# Patient Record
Sex: Female | Born: 1960
Health system: Southern US, Community
[De-identification: ages and names within clinical notes are randomized; demographics above are authoritative.]

## PROBLEM LIST (undated history)

## (undated) DIAGNOSIS — K529 Noninfective gastroenteritis and colitis, unspecified: Secondary | ICD-10-CM

## (undated) DIAGNOSIS — E669 Obesity, unspecified: Secondary | ICD-10-CM

## (undated) DIAGNOSIS — R51 Headache: Secondary | ICD-10-CM

## (undated) DIAGNOSIS — F419 Anxiety disorder, unspecified: Secondary | ICD-10-CM

## (undated) DIAGNOSIS — E119 Type 2 diabetes mellitus without complications: Secondary | ICD-10-CM

## (undated) DIAGNOSIS — D649 Anemia, unspecified: Secondary | ICD-10-CM

## (undated) DIAGNOSIS — K219 Gastro-esophageal reflux disease without esophagitis: Secondary | ICD-10-CM

## (undated) DIAGNOSIS — M1990 Unspecified osteoarthritis, unspecified site: Secondary | ICD-10-CM

## (undated) DIAGNOSIS — M797 Fibromyalgia: Secondary | ICD-10-CM

## (undated) DIAGNOSIS — R519 Headache, unspecified: Secondary | ICD-10-CM

## (undated) DIAGNOSIS — Z800 Family history of malignant neoplasm of digestive organs: Secondary | ICD-10-CM

## (undated) DIAGNOSIS — F4310 Post-traumatic stress disorder, unspecified: Secondary | ICD-10-CM

## (undated) DIAGNOSIS — Z8042 Family history of malignant neoplasm of prostate: Secondary | ICD-10-CM

## (undated) DIAGNOSIS — I10 Essential (primary) hypertension: Secondary | ICD-10-CM

## (undated) DIAGNOSIS — F329 Major depressive disorder, single episode, unspecified: Secondary | ICD-10-CM

## (undated) DIAGNOSIS — K635 Polyp of colon: Secondary | ICD-10-CM

## (undated) DIAGNOSIS — K760 Fatty (change of) liver, not elsewhere classified: Secondary | ICD-10-CM

## (undated) DIAGNOSIS — E039 Hypothyroidism, unspecified: Secondary | ICD-10-CM

## (undated) HISTORY — PX: KIDNEY SURGERY: SHX687

## (undated) HISTORY — DX: Obesity, unspecified: E66.9

## (undated) HISTORY — DX: Headache: R51

## (undated) HISTORY — DX: Fatty (change of) liver, not elsewhere classified: K76.0

## (undated) HISTORY — DX: Hypothyroidism, unspecified: E03.9

## (undated) HISTORY — DX: Headache, unspecified: R51.9

## (undated) HISTORY — DX: Fibromyalgia: M79.7

## (undated) HISTORY — DX: Anemia, unspecified: D64.9

## (undated) HISTORY — DX: Family history of malignant neoplasm of prostate: Z80.42

## (undated) HISTORY — DX: Unspecified osteoarthritis, unspecified site: M19.90

## (undated) HISTORY — DX: Type 2 diabetes mellitus without complications: E11.9

## (undated) HISTORY — PX: ANKLE SURGERY: SHX546

## (undated) HISTORY — DX: Noninfective gastroenteritis and colitis, unspecified: K52.9

## (undated) HISTORY — DX: Anxiety disorder, unspecified: F41.9

## (undated) HISTORY — DX: Family history of malignant neoplasm of digestive organs: Z80.0

## (undated) HISTORY — DX: Polyp of colon: K63.5

---

## 2013-09-22 HISTORY — PX: ABDOMINAL HYSTERECTOMY: SHX81

## 2013-09-22 HISTORY — PX: COLONOSCOPY: SHX174

## 2013-09-22 HISTORY — PX: VAGINAL HYSTERECTOMY: SUR661

## 2014-06-26 ENCOUNTER — Encounter: Payer: Self-pay | Admitting: Family Medicine

## 2017-09-22 DIAGNOSIS — K529 Noninfective gastroenteritis and colitis, unspecified: Secondary | ICD-10-CM

## 2017-09-22 HISTORY — DX: Noninfective gastroenteritis and colitis, unspecified: K52.9

## 2018-04-29 DIAGNOSIS — Z6841 Body Mass Index (BMI) 40.0 and over, adult: Secondary | ICD-10-CM | POA: Insufficient documentation

## 2018-04-29 DIAGNOSIS — M5135 Other intervertebral disc degeneration, thoracolumbar region: Secondary | ICD-10-CM | POA: Insufficient documentation

## 2018-04-29 DIAGNOSIS — I10 Essential (primary) hypertension: Secondary | ICD-10-CM | POA: Insufficient documentation

## 2018-09-01 DIAGNOSIS — K760 Fatty (change of) liver, not elsewhere classified: Secondary | ICD-10-CM | POA: Insufficient documentation

## 2018-10-27 ENCOUNTER — Other Ambulatory Visit: Payer: Self-pay

## 2018-10-27 ENCOUNTER — Emergency Department (HOSPITAL_COMMUNITY)
Admission: EM | Admit: 2018-10-27 | Discharge: 2018-10-27 | Disposition: A | Discharge status: Home / Self Care | Payer: Medicare HMO | Attending: Emergency Medicine | Admitting: Emergency Medicine

## 2018-10-27 ENCOUNTER — Emergency Department (HOSPITAL_COMMUNITY): Payer: Medicare HMO

## 2018-10-27 ENCOUNTER — Encounter (HOSPITAL_COMMUNITY): Payer: Self-pay | Admitting: Emergency Medicine

## 2018-10-27 DIAGNOSIS — W19XXXA Unspecified fall, initial encounter: Secondary | ICD-10-CM | POA: Diagnosis not present

## 2018-10-27 DIAGNOSIS — S8292XA Unspecified fracture of left lower leg, initial encounter for closed fracture: Secondary | ICD-10-CM | POA: Diagnosis not present

## 2018-10-27 DIAGNOSIS — Y939 Activity, unspecified: Secondary | ICD-10-CM | POA: Diagnosis not present

## 2018-10-27 DIAGNOSIS — Y929 Unspecified place or not applicable: Secondary | ICD-10-CM | POA: Diagnosis not present

## 2018-10-27 DIAGNOSIS — Y999 Unspecified external cause status: Secondary | ICD-10-CM | POA: Insufficient documentation

## 2018-10-27 DIAGNOSIS — M25579 Pain in unspecified ankle and joints of unspecified foot: Secondary | ICD-10-CM

## 2018-10-27 DIAGNOSIS — Q899 Congenital malformation, unspecified: Secondary | ICD-10-CM

## 2018-10-27 DIAGNOSIS — R55 Syncope and collapse: Secondary | ICD-10-CM

## 2018-10-27 DIAGNOSIS — S82892A Other fracture of left lower leg, initial encounter for closed fracture: Secondary | ICD-10-CM

## 2018-10-27 DIAGNOSIS — Z79899 Other long term (current) drug therapy: Secondary | ICD-10-CM | POA: Insufficient documentation

## 2018-10-27 HISTORY — DX: Post-traumatic stress disorder, unspecified: F43.10

## 2018-10-27 HISTORY — DX: Major depressive disorder, single episode, unspecified: F32.9

## 2018-10-27 LAB — CBC WITH DIFFERENTIAL/PLATELET
ABS IMMATURE GRANULOCYTES: 0.04 10*3/uL (ref 0.00–0.07)
BASOS PCT: 1 %
Basophils Absolute: 0.1 10*3/uL (ref 0.0–0.1)
Eosinophils Absolute: 0.1 10*3/uL (ref 0.0–0.5)
Eosinophils Relative: 1 %
HCT: 36.9 % (ref 36.0–46.0)
Hemoglobin: 11.1 g/dL — ABNORMAL LOW (ref 12.0–15.0)
Immature Granulocytes: 0 %
Lymphocytes Relative: 21 %
Lymphs Abs: 2 10*3/uL (ref 0.7–4.0)
MCH: 26.5 pg (ref 26.0–34.0)
MCHC: 30.1 g/dL (ref 30.0–36.0)
MCV: 88.1 fL (ref 80.0–100.0)
Monocytes Absolute: 0.5 10*3/uL (ref 0.1–1.0)
Monocytes Relative: 5 %
Neutro Abs: 7 10*3/uL (ref 1.7–7.7)
Neutrophils Relative %: 72 %
PLATELETS: 280 10*3/uL (ref 150–400)
RBC: 4.19 MIL/uL (ref 3.87–5.11)
RDW: 15.9 % — ABNORMAL HIGH (ref 11.5–15.5)
WBC: 9.7 10*3/uL (ref 4.0–10.5)
nRBC: 0 % (ref 0.0–0.2)

## 2018-10-27 LAB — COMPREHENSIVE METABOLIC PANEL
ALBUMIN: 3.5 g/dL (ref 3.5–5.0)
ALT: 29 U/L (ref 0–44)
AST: 28 U/L (ref 15–41)
Alkaline Phosphatase: 147 U/L — ABNORMAL HIGH (ref 38–126)
Anion gap: 11 (ref 5–15)
BILIRUBIN TOTAL: 0.3 mg/dL (ref 0.3–1.2)
BUN: 9 mg/dL (ref 6–20)
CO2: 25 mmol/L (ref 22–32)
Calcium: 9.1 mg/dL (ref 8.9–10.3)
Chloride: 105 mmol/L (ref 98–111)
Creatinine, Ser: 1.02 mg/dL — ABNORMAL HIGH (ref 0.44–1.00)
GFR calc Af Amer: >60 mL/min (ref >60)
GFR calc non Af Amer: >60 mL/min (ref >60)
Glucose, Bld: 132 mg/dL — ABNORMAL HIGH (ref 70–99)
POTASSIUM: 4.4 mmol/L (ref 3.5–5.1)
Sodium: 141 mmol/L (ref 135–145)
Total Protein: 6.9 g/dL (ref 6.5–8.1)

## 2018-10-27 MED ORDER — PROPOFOL 10 MG/ML IV BOLUS
2.0000 mg/kg | Freq: Once | INTRAVENOUS | Status: AC
  Administered 2018-10-27: 90 mg via INTRAVENOUS
  Filled 2018-10-27: qty 40

## 2018-10-27 MED ORDER — KETOROLAC TROMETHAMINE 30 MG/ML IJ SOLN
30.0000 mg | Freq: Once | INTRAMUSCULAR | Status: AC
  Administered 2018-10-27: 30 mg via INTRAVENOUS
  Filled 2018-10-27: qty 1

## 2018-10-27 MED ORDER — OXYCODONE-ACETAMINOPHEN 5-325 MG PO TABS: 1.0000 | ORAL_TABLET | Freq: Three times a day (TID) | ORAL | 0 refills | Status: DC | PRN

## 2018-10-27 MED ORDER — METHOCARBAMOL 500 MG PO TABS: 500.0000 mg | ORAL_TABLET | Freq: Two times a day (BID) | ORAL | 0 refills | Status: DC

## 2018-10-27 MED ORDER — HYDROMORPHONE HCL 1 MG/ML IJ SOLN
1.0000 mg | Freq: Once | INTRAMUSCULAR | Status: AC
  Administered 2018-10-27: 1 mg via INTRAVENOUS
  Filled 2018-10-27: qty 1

## 2018-10-27 MED ORDER — PROPOFOL 10 MG/ML IV BOLUS
INTRAVENOUS | Status: AC | PRN
  Administered 2018-10-27: 50 mg via INTRAVENOUS
  Administered 2018-10-27: 40 mg via INTRAVENOUS

## 2018-10-27 MED ORDER — FENTANYL CITRATE (PF) 100 MCG/2ML IJ SOLN
50.0000 ug | Freq: Once | INTRAMUSCULAR | Status: DC
  Filled 2018-10-27: qty 2

## 2018-10-27 MED ORDER — ONDANSETRON HCL 4 MG/2ML IJ SOLN
4.0000 mg | Freq: Once | INTRAMUSCULAR | Status: AC
  Administered 2018-10-27: 4 mg via INTRAVENOUS
  Filled 2018-10-27: qty 2

## 2018-10-27 MED ORDER — ONDANSETRON 4 MG PO TBDP
4.0000 mg | ORAL_TABLET | Freq: Once | ORAL | Status: AC
  Administered 2018-10-27: 4 mg via ORAL
  Filled 2018-10-27: qty 1

## 2018-10-27 MED ORDER — SODIUM CHLORIDE 0.9 % IV BOLUS
500.0000 mL | Freq: Once | INTRAVENOUS | Status: AC
  Administered 2018-10-27: 500 mL via INTRAVENOUS

## 2018-10-27 MED ORDER — IBUPROFEN 600 MG PO TABS: 600.0000 mg | ORAL_TABLET | Freq: Four times a day (QID) | ORAL | 0 refills | Status: DC | PRN

## 2018-10-27 NOTE — Discharge Instructions (Signed)
Please follow-up with the orthopedic doctor listed below regarding your ankle fracture. You will need to establish care with a primary care provider that can change your medications as needed as they may be causing you side effects. Return to the ED if you start to have chest pain, shortness of breath, headache and blurry vision, numbness in arms or legs.

## 2018-10-27 NOTE — ED Notes (Signed)
Patient verbalizes understanding of discharge instructions. Opportunity for questioning and answers were provided. Armband removed by staff, pt discharged from ED in wheelchair.  

## 2018-10-27 NOTE — ED Triage Notes (Addendum)
Pt arrives to ED from home with complaints of getting dizzy and having a near syncopal episode while reaching for something in her kitchen. Pt stated she fell and her left ankle but did not pass out. EMS reports pt has left ankle deformity but has pulses in foot. Pt received fentanyl via EMS.

## 2018-10-27 NOTE — ED Provider Notes (Signed)
[facility] EMERGENCY DEPARTMENT Provider Note   CSN: [id number] Arrival date & time: 10/27/18  1756     History   Chief Complaint Chief Complaint  Patient presents with  . Near Syncope  . Ankle Pain    HPI Tiffany Reese is a 58 y.o. female with a past medical history of hypertension, PTSD, who presents to ED for near syncopal episode, left ankle pain.  Patient recently moved to [facility] from [place] [employer] several months ago to be near her daughter.  She states that she has established care with a psychiatrist here who increased her dose of amitriptyline from 100 mg nightly to 150 mg nightly since December 2019.  Since then she feels that she has had dizziness, lightheadedness, near syncope and intermittent weakness in bilateral upper extremities.  States that when she tries to reach for certain things she will feel "like my arms are getting tired."  She had an episode prior to arrival where she was reaching for something in her kitchen, began feeling dizzy and fell.  This resulted in a left ankle deformity and pain.  She denies any head injury or loss of consciousness and is able to fully remember the event.  She called her daughter to come help her.  She was given 250 mcg of fentanyl by EMS.  She is not complaining of nausea.  The only trigger for the symptoms that she can think of is that the increase in the amitriptyline.  She denies any prior left ankle fracture, dislocation or procedure.  She denies any headache, vision changes, chest pain, shortness of breath, leg swelling.  HPI  Past Medical History:  Diagnosis Date  . Depression, major   . PTSD (post-traumatic stress disorder)     There are no active problems to display for this patient.   Past Surgical History:  Procedure Laterality Date  . ANKLE SURGERY       OB History   No obstetric history on file.      Home Medications    Prior to Admission medications   Medication Sig Start Date End Date  Taking? Authorizing Provider  ibuprofen (ADVIL,MOTRIN) 600 MG tablet Take 1 tablet (600 mg total) by mouth every 6 (six) hours as needed. 10/27/18   Suzie Vandam, PA-C  methocarbamol (ROBAXIN) 500 MG tablet Take 1 tablet (500 mg total) by mouth 2 (two) times daily. 10/27/18   Carry Ortez, PA-C  oxyCODONE-acetaminophen (PERCOCET/ROXICET) 5-325 MG tablet Take 1 tablet by mouth every 8 (eight) hours as needed for severe pain. 10/27/18   Dietrich Pates, PA-C    Family History History reviewed. No pertinent family history.  Social History Social History   Tobacco Use  . Smoking status: Never Smoker  . Smokeless tobacco: Never Used  Substance Use Topics  . Alcohol use: Not Currently  . Drug use: Not on file     Allergies   Sulfa antibiotics   Review of Systems Review of Systems  Constitutional: Positive for fatigue. Negative for appetite change, chills and fever.  HENT: Negative for ear pain, rhinorrhea, sneezing and sore throat.   Eyes: Negative for photophobia and visual disturbance.  Respiratory: Negative for cough, chest tightness, shortness of breath and wheezing.   Cardiovascular: Negative for chest pain and palpitations.  Gastrointestinal: Negative for abdominal pain, blood in stool, constipation, diarrhea, nausea and vomiting.  Genitourinary: Negative for dysuria, hematuria and urgency.  Musculoskeletal: Positive for arthralgias and joint swelling. Negative for myalgias.  Skin: Negative for rash.  Neurological: Positive for dizziness and weakness. Negative for light-headedness.     Physical Exam Updated Vital Signs BP (!) 101/91   Pulse 71   Temp 98.1 F (36.7 C) (Oral)   Resp 20   Ht 5\' 6"  (1.676 m)   Wt 127 kg   SpO2 95%   BMI 45.19 kg/m   Physical Exam Vitals signs and nursing note reviewed.  Constitutional:      General: She is not in acute distress.    Appearance: She is well-developed.  HENT:     Head: Normocephalic and atraumatic.     Nose: Nose normal.    Eyes:     General: No scleral icterus.       Left eye: No discharge.     Conjunctiva/sclera: Conjunctivae normal.  Neck:     Musculoskeletal: Normal range of motion and neck supple.  Cardiovascular:     Rate and Rhythm: Normal rate and regular rhythm.     Heart sounds: Normal heart sounds. No murmur. No friction rub. No gallop.   Pulmonary:     Effort: Pulmonary effort is normal. No respiratory distress.     Breath sounds: Normal breath sounds.  Abdominal:     General: Bowel sounds are normal. There is no distension.     Palpations: Abdomen is soft.     Tenderness: There is no abdominal tenderness. There is no guarding.  Musculoskeletal: Normal range of motion.        General: Swelling, tenderness and deformity (Left ankle) present.     Comments: 2+ DP pulse palpated of left ankle.  Skin:    General: Skin is warm and dry.     Findings: No rash.  Neurological:     General: No focal deficit present.     Mental Status: She is alert and oriented to person, place, and time.     Cranial Nerves: No cranial nerve deficit.     Sensory: No sensory deficit.     Motor: No weakness or abnormal muscle tone.     Coordination: Coordination normal.     Comments: Pupils reactive. No facial asymmetry noted. Cranial nerves appear grossly intact. Sensation intact to light touch on face, BUE and BLE.       ED Treatments / Results  Labs (all labs ordered are listed, but only abnormal results are displayed) Labs Reviewed  COMPREHENSIVE METABOLIC PANEL - Abnormal; Notable for the following components:      Result Value   Glucose, Bld 132 (*)    Creatinine, Ser 1.02 (*)    Alkaline Phosphatase 147 (*)    All other components within normal limits  CBC WITH DIFFERENTIAL/PLATELET - Abnormal; Notable for the following components:   Hemoglobin 11.1 (*)    RDW 15.9 (*)    All other components within normal limits  URINALYSIS, ROUTINE W REFLEX MICROSCOPIC    EKG EKG  Interpretation  Date/Time:  Wednesday October 27 2018 18:54:29 EST Ventricular Rate:  70 PR Interval:    QRS Duration: 103 QT Interval:  422 QTC Calculation: 456 R Axis:   -2 Text Interpretation:  Sinus rhythm Borderline T abnormalities, anterior leads No prior ECG, t wave inversion in leads V1-V3.  No STEMI Confirmed by Theda Belfastegeler, Chris ([id number]) on 10/27/2018 7:41:22 PM   Radiology Dg Chest 2 View  Result Date: 10/27/2018 CLINICAL DATA:  Near syncope. EXAM: CHEST - 2 VIEW COMPARISON:  None. FINDINGS: The heart size and mediastinal contours are within normal limits. Both lungs are clear. The  visualized skeletal structures are unremarkable. IMPRESSION: No active cardiopulmonary disease. Electronically Signed   By: Elsie Stain M.D.   On: 10/27/2018 20:25   Dg Ankle 2 Views Left  Result Date: 10/27/2018 CLINICAL DATA:  Larey Seat at home. EXAM: LEFT ANKLE - 2 VIEW COMPARISON:  None. FINDINGS: Fracture dislocation noted at the ankle. The talus is dislocated laterally in relation to the tibia. Markedly displaced distal fibular fracture and posterior malleolar fracture of the tibia. Transverse fracture through the base of the medial malleolus. The talus is intact. The subtalar joints are maintained. IMPRESSION: Complex comminuted fracture dislocation at the ankle. Electronically Signed   By: Rudie Meyer M.D.   On: 10/27/2018 19:37   Dg Ankle Left Port  Result Date: 10/27/2018 CLINICAL DATA:  Postreduction. EXAM: PORTABLE LEFT ANKLE - 2 VIEW COMPARISON:  LEFT ankle radiograph October 27, 2018 at 1915 hours FINDINGS: Acute trimalleolar fracture in alignment. Improved alignment of ankle mortise, mild widening of the anterior tibial talar space. Thick fiberglass splint obscures the fine bony detail. IMPRESSION: 1. Acute trimalleolar fracture in alignment. 2. Improved alignment of ankle mortise. Electronically Signed   By: Awilda Metro M.D.   On: 10/27/2018 21:45    Procedures Procedures (including  critical care time)  Medications Ordered in ED Medications  HYDROmorphone (DILAUDID) injection 1 mg (has no administration in time range)  ketorolac (TORADOL) 30 MG/ML injection 30 mg (has no administration in time range)  sodium chloride 0.9 % bolus 500 mL (0 mLs Intravenous Stopped 10/27/18 2049)  ondansetron (ZOFRAN) injection 4 mg (4 mg Intravenous Given 10/27/18 1856)  HYDROmorphone (DILAUDID) injection 1 mg (1 mg Intravenous Given 10/27/18 1944)  propofol (DIPRIVAN) 10 mg/mL bolus/IV push 254 mg (90 mg Intravenous Given 10/27/18 2053)  propofol (DIPRIVAN) 10 mg/mL bolus/IV push (40 mg Intravenous Given 10/27/18 2057)     Initial Impression / Assessment and Plan / ED Course  I have reviewed the triage vital signs and the nursing notes.  Pertinent labs & imaging results that were available during my care of the patient were reviewed by me and considered in my medical decision making (see chart for details).  Clinical Course as of Oct 27 2150  Wed Oct 27, 2018  2002 Spoke to Dr. Eulah Pont of orthopedics.  He recommends for reduction, splinting immediately with turning medially. Would like for [place] to get post reduction films with splint.   [HK]    Clinical Course User Index [HK] Dietrich Pates, PA-C    58 year old female presents to ED for near syncopal episodes.  She had an episode today when she was reaching for something in her kitchen which caused her to fall and twist her ankle.  Is had intermittent symptoms for the past month ever since she increased her dose of Elavil from 100 mg to 150 mg as advised by her psychiatrist.  On my exam she is overall well-appearing.  No deficits on neurological exam noted.  She denies any chest pain, shortness of breath, headache or vision changes.  She did not have any head injuries or loss of consciousness with the prior syncopal episode prior to arrival.  Vital signs are within normal limits.  CBC, CMP, chest x-ray unremarkable.  EKG shows some t wave inversions  with no prior EKGs for comparison. Ankle x-ray shows comminuted, displaced fracture.  This was reduced with procedural sedation here and splinted.  Post reduction films were sent to Dr. Eulah Pont of orthopedics.  He request that she follow-up in his office.  I am unsure if this is all related to medication changes.  Due to her reassuring work-up and imaging studies, I feel that she is appropriate for following up with her PCP for any further medication changes.  Will advise patient to increase hydration and return to ED for any severe worsening symptoms. Of note, daughter at bedside is concerned due to patient's history with opioid pain medication.  Patient states that she is to be in a pain management clinic and "I was never addicted to it, I did take it."  I discussed risks and benefits with the patient stating that she does have a severe ankle fracture that will cause a large amount of pain.  I will encourage her to take ibuprofen as well as the Percocet that we are giving her which will be used for breakthrough pain.  Patient is agreeable to this plan.  [place] PMP queried. Sedation reduction done by Dr. Rush Landmarkegeler.  Please see his note for further detail.   Evaluation does not show pathology that would require ongoing emergent intervention or inpatient treatment. I explained the diagnosis to the patient. Pain has been managed and has no complaints prior to discharge. Patient is comfortable with above plan and is stable for discharge at this time. All questions were answered prior to disposition. Strict return precautions for returning to the ED were discussed. Encouraged follow up with PCP.    Portions of this note were generated with Scientist, clinical (histocompatibility and immunogenetics)Dragon dictation software. Dictation errors may occur despite best attempts at proofreading.   Final Clinical Impressions(s) / ED Diagnoses   Final diagnoses:  Closed fracture of left ankle, initial encounter  Ankle pain    ED Discharge Orders         Ordered     oxyCODONE-acetaminophen (PERCOCET/ROXICET) 5-325 MG tablet  Every 8 hours PRN     10/27/18 2150    methocarbamol (ROBAXIN) 500 MG tablet  2 times daily     10/27/18 2151    ibuprofen (ADVIL,MOTRIN) 600 MG tablet  Every 6 hours PRN     10/27/18 2152           Dietrich PatesKhatri, Smiley Birr, PA-C 10/27/18 2152    Tegeler, Canary Brimhristopher J, MD 10/28/18 [phone]

## 2018-10-28 ENCOUNTER — Telehealth: Payer: Self-pay | Admitting: *Deleted

## 2018-10-28 NOTE — ED Provider Notes (Signed)
.Sedation Date/Time: 10/28/2018 1:14 AM Performed by: Heide Scales, MD Authorized by: Heide Scales, MD   Consent:    Consent obtained:  Verbal   Consent given by:  Patient   Risks discussed:  Allergic reaction, dysrhythmia, inadequate sedation, nausea, prolonged hypoxia resulting in organ damage, prolonged sedation necessitating reversal, respiratory compromise necessitating ventilatory assistance and intubation and vomiting   Alternatives discussed:  Analgesia without sedation, anxiolysis and regional anesthesia Universal protocol:    Procedure explained and questions answered to patient or proxy's satisfaction: yes     Relevant documents present and verified: yes     Test results available and properly labeled: yes     Imaging studies available: yes     Required blood products, implants, devices, and special equipment available: yes     Site/side marked: yes     Immediately prior to procedure a time out was called: yes     Patient identity confirmation method:  Verbally with patient Indications:    Procedure necessitating sedation performed by:  Physician performing sedation Pre-sedation assessment:    Time since last food or drink:  Hours ago   ASA classification: class 1 - normal, healthy patient     Neck mobility: normal     Mouth opening:  3 or more finger widths   Thyromental distance:  4 finger widths   Mallampati score:  I - soft palate, uvula, fauces, pillars visible   Pre-sedation assessments completed and reviewed: airway patency, cardiovascular function, hydration status, mental status, nausea/vomiting, pain level, respiratory function and temperature   Immediate pre-procedure details:    Reassessment: Patient reassessed immediately prior to procedure     Reviewed: vital signs, relevant labs/tests and NPO status     Verified: bag valve mask available, emergency equipment available, intubation equipment available, IV patency confirmed, oxygen available  and suction available   Procedure details (see MAR for exact dosages):    Preoxygenation:  Nasal cannula   Sedation:  Propofol   Intra-procedure monitoring:  Blood pressure monitoring, cardiac monitor, continuous pulse oximetry, frequent LOC assessments, frequent vital sign checks and continuous capnometry   Intra-procedure events: none     Total Provider sedation time (minutes):  30 Post-procedure details:    Attendance: Constant attendance by certified staff until patient recovered     Recovery: Patient returned to pre-procedure baseline     Post-sedation assessments completed and reviewed: airway patency, cardiovascular function, hydration status, mental status, nausea/vomiting, pain level, respiratory function and temperature     Patient is stable for discharge or admission: yes     Patient tolerance:  Tolerated well, no immediate complications Reduction of fracture Date/Time: 10/28/2018 1:14 AM Performed by: Heide Scales, MD Authorized by: Heide Scales, MD  Consent: Written consent obtained. Risks and benefits: risks, benefits and alternatives were discussed Consent given by: patient Patient understanding: patient states understanding of the procedure being performed Patient consent: the patient's understanding of the procedure matches consent given Procedure consent: procedure consent matches procedure scheduled Test results: test results available and properly labeled Imaging studies: imaging studies available Required items: required blood products, implants, devices, and special equipment available Patient identity confirmed: verbally with patient and hospital-assigned identification number Time out: Immediately prior to procedure a "time out" was called to verify the correct patient, procedure, equipment, support staff and site/side marked as required.  Sedation: Patient sedated: yes Sedation type: moderate (conscious) sedation  Patient tolerance: Patient  tolerated the procedure well with no immediate complications Comments: Left ankle  fracture reduction.  After reduction, normal sensation, pulse, and strength of toes.       Aaryana Betke, Canary Brim, MD 10/28/18 [phone]

## 2018-10-28 NOTE — Telephone Encounter (Signed)
Pt daughter called regarding DME order faxed to company that does not accept mother's insurance.  [facility] faxed order to [facility] as requested AND gave verbal order to employee.

## 2018-10-29 ENCOUNTER — Emergency Department (HOSPITAL_COMMUNITY)
Admission: EM | Admit: 2018-10-29 | Discharge: 2018-10-29 | Disposition: A | Discharge status: Home / Self Care | Payer: Medicare HMO | Attending: Emergency Medicine | Admitting: Emergency Medicine

## 2018-10-29 ENCOUNTER — Other Ambulatory Visit: Payer: Self-pay

## 2018-10-29 ENCOUNTER — Encounter (HOSPITAL_COMMUNITY): Payer: Self-pay | Admitting: *Deleted

## 2018-10-29 ENCOUNTER — Emergency Department (HOSPITAL_COMMUNITY): Payer: Medicare HMO

## 2018-10-29 DIAGNOSIS — S82892D Other fracture of left lower leg, subsequent encounter for closed fracture with routine healing: Secondary | ICD-10-CM | POA: Insufficient documentation

## 2018-10-29 DIAGNOSIS — W19XXXD Unspecified fall, subsequent encounter: Secondary | ICD-10-CM | POA: Insufficient documentation

## 2018-10-29 DIAGNOSIS — M25572 Pain in left ankle and joints of left foot: Secondary | ICD-10-CM

## 2018-10-29 DIAGNOSIS — S8992XD Unspecified injury of left lower leg, subsequent encounter: Secondary | ICD-10-CM | POA: Diagnosis present

## 2018-10-29 MED ORDER — MORPHINE SULFATE (PF) 4 MG/ML IV SOLN
4.0000 mg | Freq: Once | INTRAVENOUS | Status: AC
  Administered 2018-10-29: 4 mg via INTRAMUSCULAR
  Filled 2018-10-29: qty 1

## 2018-10-29 MED ORDER — MORPHINE SULFATE (PF) 4 MG/ML IV SOLN: 4.0000 mg | Freq: Once | INTRAVENOUS | Status: DC

## 2018-10-29 NOTE — ED Notes (Signed)
Pt asking for more pain medicine.

## 2018-10-29 NOTE — ED Notes (Signed)
C/o no pain relief yet from this pain med ice pack on her lt ankle

## 2018-10-29 NOTE — ED Notes (Signed)
One unsuccessful attempt to start iv  No vivible veins

## 2018-10-29 NOTE — Discharge Instructions (Addendum)
Continue to use your splint at all times until you've seen the orthopedist. Continue to use your home walker and wheel chair to help with getting around. Ice and elevate ankle/foot throughout the day, using ice pack for no more than 20 minutes every hour.  Alternate between ibuprofen and home pain med for pain relief. Do not drive or operate machinery with pain medication use. Follow up with your orthopedist in 1 week for recheck of symptoms and ongoing management of your known ankle fracture. Return to the ER for changes or worsening symptoms.

## 2018-10-29 NOTE — ED Notes (Signed)
To x-ray

## 2018-10-29 NOTE — ED Triage Notes (Signed)
The pt is c/o paion since she fell onto her lt ankle earlier today  She was seen here Wednesday night and had a fracture reduced  Now the pain is severe since the fall

## 2018-10-29 NOTE — ED Notes (Signed)
Iv team nurse here

## 2018-10-29 NOTE — ED Provider Notes (Signed)
[facility] EMERGENCY DEPARTMENT Provider Note   CSN: [id number] Arrival date & time: 10/29/18  1738     History   Chief Complaint Chief Complaint  Patient presents with  . Ankle Pain    HPI Tiffany Reese is a 58 y.o. female with a PMHx of depression and PTSD, who presents to the ED with complaints of L ankle and foot pain after mechanical fall today just PTA.  Chart review reveals that she was seen in the ED on 10/27/18 for near syncope/fall resulting in L ankle trimalleolar fracture with dislocation, which was reduced and splinted, she was sent home with pain meds and advised to f/up with Dr. Eulah Pont of orthopedics.  Patient states that she has had the splint on at all times, she has been using a wheelchair and a walker at home for ambulation.  She is scheduled for surgery next week with Dr. Eulah Pont.  She states that today she was getting out of her wheelchair to go to the bathroom, she was hopping on her right leg when she stumbled, causing her to fall into the wall, but she put her left foot down and now has worsening of her left ankle and foot pain.  She describes this pain as 10/10 constant throbbing and sharp left ankle pain that radiates into the left foot, worse with movement of the ankle or foot, and with no treatments tried prior to arrival, although she took a Norco prior to the fall for her baseline left ankle pain from her fracture.  She is unsure whether there is any swelling or bruising because she has not taken the splint off.  She has some tingling and burning in the foot which has increased since this most recent injury, but was present previously.  She denies hitting her head or losing consciousness, denies falling to the ground or sustaining any other injuries.  She denies any numbness or focal weakness, or any other complaints at this time.  The history is provided by the patient and medical records. No language interpreter was used.  Ankle Pain    Past  Medical History:  Diagnosis Date  . Depression, major   . PTSD (post-traumatic stress disorder)     There are no active problems to display for this patient.   Past Surgical History:  Procedure Laterality Date  . ANKLE SURGERY       OB History   No obstetric history on file.      Home Medications    Prior to Admission medications   Medication Sig Start Date End Date Taking? Authorizing Provider  ibuprofen (ADVIL,MOTRIN) 600 MG tablet Take 1 tablet (600 mg total) by mouth every 6 (six) hours as needed. 10/27/18   Khatri, Hina, PA-C  methocarbamol (ROBAXIN) 500 MG tablet Take 1 tablet (500 mg total) by mouth 2 (two) times daily. 10/27/18   Khatri, Hina, PA-C  oxyCODONE-acetaminophen (PERCOCET/ROXICET) 5-325 MG tablet Take 1 tablet by mouth every 8 (eight) hours as needed for severe pain. 10/27/18   Dietrich Pates, PA-C    Family History No family history on file.  Social History Social History   Tobacco Use  . Smoking status: Never Smoker  . Smokeless tobacco: Never Used  Substance Use Topics  . Alcohol use: Not Currently  . Drug use: Not on file     Allergies   Sulfa antibiotics   Review of Systems Review of Systems  HENT: Negative for facial swelling (no head inj).   Musculoskeletal: Positive for  arthralgias and myalgias.  Allergic/Immunologic: Negative for immunocompromised state.  Neurological: Negative for syncope, weakness and numbness.  Psychiatric/Behavioral: Negative for confusion.      Physical Exam Updated Vital Signs BP 114/64   Pulse 84   Temp 97.9 F (36.6 C)   Resp 20   Ht 5\' 6"  (1.676 m)   Wt 127 kg   SpO2 98%   BMI 45.19 kg/m   Physical Exam Vitals signs and nursing note reviewed.  Constitutional:      General: She is not in acute distress.    Appearance: Normal appearance. She is well-developed. She is not toxic-appearing.     Comments: Afebrile, nontoxic, NAD  HENT:     Head: Normocephalic and atraumatic.  Eyes:     General:          Right eye: No discharge.        Left eye: No discharge.     Conjunctiva/sclera: Conjunctivae normal.  Neck:     Musculoskeletal: Normal range of motion and neck supple.  Cardiovascular:     Rate and Rhythm: Normal rate.     Pulses: Normal pulses.  Pulmonary:     Effort: Pulmonary effort is normal. No respiratory distress.  Abdominal:     General: There is no distension.  Musculoskeletal:     Left ankle: She exhibits decreased range of motion (due to pain and splint), swelling and ecchymosis. She exhibits no deformity. Tenderness.     Comments: L ankle and foot in splint, no gross deformity noted, moderate swelling and some older appearing bruising to the ankle, none to the foot, diffusely TTP to ankle and foot, no crepitus or deformity, wiggles toes without difficulty, ROM of ankle limited due to splint and due to pain, sensation grossly intact, distal pulses intact, soft compartments.   Skin:    General: Skin is warm and dry.     Findings: No rash.  Neurological:     Mental Status: She is alert and oriented to person, place, and time.     Sensory: Sensation is intact. No sensory deficit.     Motor: Motor function is intact.  Psychiatric:        Mood and Affect: Mood and affect normal.        Behavior: Behavior normal.      ED Treatments / Results  Labs (all labs ordered are listed, but only abnormal results are displayed) Labs Reviewed - No data to display  EKG None  Radiology Dg Chest 2 View  Result Date: 10/27/2018 CLINICAL DATA:  Near syncope. EXAM: CHEST - 2 VIEW COMPARISON:  None. FINDINGS: The heart size and mediastinal contours are within normal limits. Both lungs are clear. The visualized skeletal structures are unremarkable. IMPRESSION: No active cardiopulmonary disease. Electronically Signed   By: Elsie Stain M.D.   On: 10/27/2018 20:25   Dg Ankle Complete Left  Result Date: 10/29/2018 CLINICAL DATA:  58 year old female who fell 2 days ago breaking ankle,  repeat fall today. EXAM: LEFT ANKLE COMPLETE - 3+ VIEW COMPARISON:  Post reduction images 2520 and earlier. FINDINGS: Cast or splint material remains in place about the ankle with underlying trimalleolar fracture not significantly changed in alignment from the post reduction images 2 days ago. Talus and calcaneus appear remain intact. IMPRESSION: Trimalleolar fracture with stable configuration since the post reduction images 2 days ago. Electronically Signed   By: Odessa Fleming M.D.   On: 10/29/2018 19:24   Dg Ankle Left Port  Result Date: 10/27/2018  CLINICAL DATA:  Postreduction. EXAM: PORTABLE LEFT ANKLE - 2 VIEW COMPARISON:  LEFT ankle radiograph October 27, 2018 at 1915 hours FINDINGS: Acute trimalleolar fracture in alignment. Improved alignment of ankle mortise, mild widening of the anterior tibial talar space. Thick fiberglass splint obscures the fine bony detail. IMPRESSION: 1. Acute trimalleolar fracture in alignment. 2. Improved alignment of ankle mortise. Electronically Signed   By: Awilda Metroourtnay  Bloomer M.D.   On: 10/27/2018 21:45   Dg Foot Complete Left  Result Date: 10/29/2018 CLINICAL DATA:  58 year old female who fell 2 days ago breaking ankle, repeat fall today. EXAM: LEFT FOOT - COMPLETE 3+ VIEW COMPARISON:  Ankle series today and earlier. FINDINGS: Cast material projects about the foot and ankle and decreases bone detail on some images. There is new or increased distal soft tissue swelling and stranding at the MTP joint level. No soft tissue gas. No definite metatarsal or phalanx fracture. No definite tarsal fracture. IMPRESSION: 1. Cast material which obscures bone detail on some images. 2. New or increased soft tissue swelling and stranding at the dorsal MTP level. No associated foot fracture identified. 3. Left ankle reported separately. Electronically Signed   By: Odessa FlemingH  Hall M.D.   On: 10/29/2018 19:26    Procedures Procedures (including critical care time)  Medications Ordered in  ED Medications  morphine 4 MG/ML injection 4 mg (4 mg Intramuscular Given 10/29/18 1822)     Initial Impression / Assessment and Plan / ED Course  I have reviewed the triage vital signs and the nursing notes.  Pertinent labs & imaging results that were available during my care of the patient were reviewed by me and considered in my medical decision making (see chart for details).  Clinical Course as of Oct 30 1931  Fri Oct 29, 2018  [id number] 58 year old female who had broken her left ankle a couple of days ago saw Dr. Eulah PontMurphy today in the office.  Plan is for surgery soon.  She unfortunately was at home today trying to transfer when she fell to the ground and banged her leg again.  She is complaining of severe ankle pain.  Distal neurovascular intact.  Splint still looks intact.  Rechecking x-rays and will review with orthopedics.   [MB]    Clinical Course User Index [MB] Terrilee FilesButler, Michael C, MD    58 y.o. female here with mechanical fall landing on her left foot which already has a left ankle fracture.  She was hopping on her right foot when she lost her balance fell into the wall, but her left foot down.  Now complains of worsening left ankle and foot pain.  On exam, splint intact, diffuse tenderness to the ankle and foot, moderate swelling and mild bruising which appears old.  Neurovascularly intact.  Will get x-rays, give pain medicine, and reassess shortly.  7:41 PM Xrays show trimalleolar fracture with stable configuration since post reduction films 2 days ago; foot xray with soft tissue swelling but no associated foot fracture. Overall stable exam/findings. Suspect she hurts just from putting weight through the ankle that is already fractured. Feeling better after morphine. Advised using home pain meds, RICE, and f/up with ortho this week for her already scheduled surgery, and for ongoing management of her known ankle fracture. Doubt need for further intervention today. Doubt need for emergent  ortho consultation. I explained the diagnosis and have given explicit precautions to return to the ER including for any other new or worsening symptoms. The patient understands and accepts the medical  plan as it's been dictated and I have answered their questions. Discharge instructions concerning home care and prescriptions have been given. The patient is STABLE and is discharged to home in good condition.    Final Clinical Impressions(s) / ED Diagnoses   Final diagnoses:  Acute left ankle pain  Closed fracture of left ankle with routine healing, subsequent encounter    ED Discharge Orders    [address]None       Hasna Stefanik, [place], [place]-C 10/29/18 1941    Terrilee FilesButler, Michael C, MD 10/29/18 2318

## 2018-11-01 ENCOUNTER — Ambulatory Visit (INDEPENDENT_AMBULATORY_CARE_PROVIDER_SITE_OTHER): Payer: Medicare HMO | Admitting: Family Medicine

## 2018-11-01 ENCOUNTER — Encounter: Payer: Self-pay | Admitting: Family Medicine

## 2018-11-01 VITALS — BP 140/62 | HR 89

## 2018-11-01 DIAGNOSIS — E039 Hypothyroidism, unspecified: Secondary | ICD-10-CM

## 2018-11-01 DIAGNOSIS — R7303 Prediabetes: Secondary | ICD-10-CM | POA: Insufficient documentation

## 2018-11-01 DIAGNOSIS — F419 Anxiety disorder, unspecified: Secondary | ICD-10-CM | POA: Diagnosis not present

## 2018-11-01 DIAGNOSIS — F339 Major depressive disorder, recurrent, unspecified: Secondary | ICD-10-CM | POA: Diagnosis not present

## 2018-11-01 DIAGNOSIS — D649 Anemia, unspecified: Secondary | ICD-10-CM | POA: Diagnosis not present

## 2018-11-01 DIAGNOSIS — F4310 Post-traumatic stress disorder, unspecified: Secondary | ICD-10-CM | POA: Diagnosis not present

## 2018-11-01 DIAGNOSIS — S82892A Other fracture of left lower leg, initial encounter for closed fracture: Secondary | ICD-10-CM | POA: Insufficient documentation

## 2018-11-01 DIAGNOSIS — E559 Vitamin D deficiency, unspecified: Secondary | ICD-10-CM

## 2018-11-01 DIAGNOSIS — S82892D Other fracture of left lower leg, subsequent encounter for closed fracture with routine healing: Secondary | ICD-10-CM

## 2018-11-01 LAB — VITAMIN B12: Vitamin B-12: 308 pg/mL (ref 211–911)

## 2018-11-01 LAB — HEMOGLOBIN A1C: Hgb A1c MFr Bld: 6.5 % (ref 4.6–6.5)

## 2018-11-01 LAB — IBC + FERRITIN
Ferritin: 27.1 ng/mL (ref 10.0–291.0)
Iron: 34 ug/dL — ABNORMAL LOW (ref 42–145)
Saturation Ratios: 10.3 % — ABNORMAL LOW (ref 20.0–50.0)
Transferrin: 236 mg/dL (ref 212.0–360.0)

## 2018-11-01 LAB — VITAMIN D 25 HYDROXY (VIT D DEFICIENCY, FRACTURES): VITD: 32.38 ng/mL (ref 30.00–100.00)

## 2018-11-01 LAB — TSH: TSH: 3.23 u[IU]/mL (ref 0.35–4.50)

## 2018-11-01 MED ORDER — DIAZEPAM 2 MG PO TABS: 2.0000 mg | ORAL_TABLET | Freq: Every day | ORAL | 0 refills | Status: DC | PRN

## 2018-11-01 NOTE — Progress Notes (Signed)
Tiffany ChestnutKelly Lea Reese DOB: May 28, 1961 Encounter date: 11/01/2018  This is a 58 y.o. female who presents to establish care. Chief Complaint  Patient presents with  . New Patient (Initial Visit)    History of present illness:  Wanted new family practitioner. Here today with daughter. Was seeing doc and psychiatrist in [place]. Has had rough few years. Saw doc in [place] until [place] got home here in [place]. Had a few medication changes that they worry made her collapse. Started out with some intermittent loss of muscle function - noted with reaching for things in cabinet or fridge and arm would just not work. Noted that she could hear "swishing" in ear when going outside. Day she had fall she had called daughter to let her know she wasn't feeling well. She then went into kitchen to get chips and reached up but started feeling funny and then just collapsed causing her to fracture her left ankle.   Recent trimalleolar fracture scheduled for surgery next week. In ER 2/7 due to increased ankle pain after landing on known fractured foot while trying to hop around at house. Has surgery this coming Friday. Was given hydrocodone but still having significant pain. Took 2 of the 5mg  hydrocodone last night. Had pins, needles, burning/stinging through leg and sharp pains through foot.   PTSD/Depression: Severe depression, anxiety, PTSD. Was living in [place]; in pain clinic at time. Was going through divorce; ex owed a lot of money to her. Tried to commit suicide. Ended up hospitalized and following with psychiatrist. Felt like she was doing really well, but then once back in [place] - anxiety and PTSD really started to go rampant. Was able to come off of all opioid medications when she was in inpatient. That is some of concern with break - trying to keep her from spiraling back to using opioids. Felt like she did better when she was on the valium.   Wanting to establish with mental health provider;  getting back on track with diet and getting to healthier weight.   Hx of migraines - comes behind one eye or other. Was on imitrex in the past but this made her sick. Takes tylenol if needed. Cannot tolerate ASA in excedrin migraine.   GERD: has heartburn, sour burps if she does not take the omeprazole.   Has been without the valium since October-November 2019.   Past Medical History:  Diagnosis Date  . Depression, major   . PTSD (post-traumatic stress disorder)    Past Surgical History:  Procedure Laterality Date  . ANKLE SURGERY     Allergies  Allergen Reactions  . Bupropion Other (See Comments)    Causes agitation  . Duloxetine Other (See Comments)    Causes involuntary movements  . Sulfa Antibiotics   . Metformin And Related     vomiting  . Paxil [Paroxetine Hcl]     Insomnia; manic  . Prozac [Fluoxetine Hcl]     Hyperactive, insomnia  . Trintellix [Vortioxetine]     Triggers fibromyalgia  . Zoloft [Sertraline Hcl]     Hyperactivity, insomnia   Current Meds  Medication Sig  . amitriptyline (ELAVIL) 150 MG tablet Take by mouth.  Marland Kitchen. atorvastatin (LIPITOR) 20 MG tablet Take by mouth.  . cetirizine (ZYRTEC) 10 MG tablet Take by mouth.  Marland Kitchen. ibuprofen (ADVIL,MOTRIN) 600 MG tablet Take 1 tablet (600 mg total) by mouth every 6 (six) hours as needed.  Marland Kitchen. levothyroxine (SYNTHROID, LEVOTHROID) 50 MCG tablet Take 50 mcg by mouth  daily before breakfast.  . lurasidone (LATUDA) 80 MG TABS tablet Take 80 mg by mouth daily with breakfast.  . methocarbamol (ROBAXIN) 500 MG tablet Take 1 tablet (500 mg total) by mouth 2 (two) times daily.  . metoprolol succinate (TOPROL-XL) 50 MG 24 hr tablet Take 50 mg by mouth daily. Take with or immediately following a meal.  . omeprazole (PRILOSEC) 40 MG capsule Take 40 mg by mouth daily.  Marland Kitchen oxyCODONE-acetaminophen (PERCOCET/ROXICET) 5-325 MG tablet Take 1 tablet by mouth every 8 (eight) hours as needed for severe pain.  . pregabalin (LYRICA) 50 MG  capsule Take 50 mg by mouth 3 (three) times daily.   Social History   Tobacco Use  . Smoking status: Current Every Day Smoker    Types: E-cigarettes  . Smokeless tobacco: Never Used  Substance Use Topics  . Alcohol use: Not Currently   Family History  Problem Relation Age of Onset  . COPD Mother 26  . Alcohol abuse Mother   . CAD Father   . Colon cancer Father 87  . Ankylosing spondylitis Sister   . Drug abuse Sister   . Alcohol abuse Sister   . Other Maternal Grandmother        lived to be over 100  . Lung cancer Maternal Grandfather   . Aneurysm Paternal Grandmother        brain     Review of Systems  Constitutional: Negative for chills, fatigue and fever.  Respiratory: Negative for cough, chest tightness, shortness of breath and wheezing.   Cardiovascular: Negative for chest pain, palpitations and leg swelling.    Objective:  BP 140/62 (BP Location: Right Arm, Patient Position: Sitting, Cuff Size: Normal)   Pulse 89   SpO2 97%       BP Readings from Last 3 Encounters:  11/01/18 140/62  10/29/18 104/60  10/27/18 103/66   Wt Readings from Last 3 Encounters:  10/29/18 280 lb (127 kg)  10/27/18 280 lb (127 kg)    Physical Exam Constitutional:      General: She is not in acute distress.    Appearance: She is well-developed.  Cardiovascular:     Rate and Rhythm: Normal rate and regular rhythm.     Heart sounds: Normal heart sounds. No murmur. No friction rub.  Pulmonary:     Effort: Pulmonary effort is normal. No respiratory distress.     Breath sounds: Normal breath sounds. No wheezing or rales.  Musculoskeletal:     Right lower leg: No edema.     Left lower leg: No edema.  Neurological:     Mental Status: She is alert and oriented to person, place, and time.  Psychiatric:        Attention and Perception: Attention normal.        Mood and Affect: Mood is anxious.        Behavior: Behavior normal. Behavior is cooperative.     Comments: Poor hygiene.  Has been unable to bathe/shower. Appears older than stated age but acts younger than stated age. She is somewhat tangential with talking, but I feel this in part is due to her trying to update me on last years of history as quickly as possible.     Assessment/Plan: 1. PTSD (post-traumatic stress disorder) Needs to be seeing psychiatry for med management. Has had difficult time tolerating meds. Needs specialist for review/continued prescribing. - Ambulatory referral to Psychiatry  2. Anxiety Small amount valium given. Discussed risk of medication, esp with pain medications  on board. I do worry about her anxiety spiraling out of hand, esp with new fracture, limited mobility, so I think this will be a good emergency "tool" for her while awaiting psychiatry. We discussed risks of this medication long term. - Ambulatory referral to Psychiatry - diazepam (VALIUM) 2 MG tablet; Take 1 tablet (2 mg total) by mouth daily as needed for anxiety (severe anxiety).  Dispense: 15 tablet; Refill: 0  3. Depression, recurrent (HCC) - Ambulatory referral to Psychiatry  4. Closed fracture of left ankle with routine healing, subsequent encounter Has surgery later this week. Ordered home health today as she is in need of help with self care, med management.  - Ambulatory referral to Home Health  5. Anemia, unspecified type Concern with this due to poor ability to care for self, food prep, access to food, etc. Daughter does live in town and is able to help. - Vitamin B12; Future - IBC + Ferritin - Vitamin B12  6. Vitamin D deficiency  - VITAMIN D 25 Hydroxy (Vit-D Deficiency, Fractures); Future - VITAMIN D 25 Hydroxy (Vit-D Deficiency, Fractures)  7. Hypothyroidism, unspecified type  - TSH; Future - TSH  8. Prediabetes Not certain that there is need or indication for Januvia with her being just predibetic? She didn't tolerate metformin. Will get previous records and review med list in more detail with  this. - Hemoglobin A1c; Future - Hemoglobin A1c  Return in about 1 month (around 11/30/2018).  Theodis Shove, MD   It was nice meeting with you today!  We will work on getting things back on track for you. You should hear from psychiatry in next couple of weeks regarding scheduling. You should also hear from home health in this time frame. Please call if you haven't heard from them in 2 weeks time.   I will call you when I get bloodwork results back (probably Wednesday).   I have sent a small supply of valium to the pharmacy for you. This is an "emergency" medication for you when anxiety is very severe. You can certainly break in half first to see if half tablet will help you before trying whole tablet. You will need to call a therapist (list provided with those accepting medicare in the area) to get started with this important aspect of your mental health.   I would like to see you back in a month to touch base on everything including mood, referral status, etc. Please let me know sooner if concerns.

## 2018-11-01 NOTE — Patient Instructions (Addendum)
It was nice meeting with you today!  We will work on getting things back on track for you. You should hear from psychiatry in next couple of weeks regarding scheduling. You should also hear from home health in this time frame. Please call if you haven't heard from them in 2 weeks time.   I will call you when I get bloodwork results back (probably Wednesday).   I have sent a small supply of valium to the pharmacy for you. This is an "emergency" medication for you when anxiety is very severe. You can certainly break in half first to see if half tablet will help you before trying whole tablet. You will need to call a therapist (list provided with those accepting medicare in the area) to get started with this important aspect of your mental health.   I would like to see you back in a month to touch base on everything including mood, referral status, etc. Please let me know sooner if concerns.

## 2018-11-01 NOTE — Progress Notes (Signed)
Tried to reach patient by phone for preop phone call. Unable to reach and there is no voice mail set up on home phone or cell phone. [facility] voicemail at Dr Greig RightMurphy's to let her know that we are unable to reach patient and that patient must be seen by anesthesiologist prior to DOS due to BMI of 45.19 and would they also try to reach patient.

## 2018-11-02 ENCOUNTER — Other Ambulatory Visit: Payer: Self-pay

## 2018-11-02 ENCOUNTER — Encounter (HOSPITAL_BASED_OUTPATIENT_CLINIC_OR_DEPARTMENT_OTHER): Payer: Self-pay | Admitting: *Deleted

## 2018-11-02 NOTE — Progress Notes (Signed)
Dr Krista Blue saw pt for review, BMI 45.8. OK for [facility].

## 2018-11-02 NOTE — H&P (Signed)
[facility] [address]   [address] [place] [address] [phone] A Division of [facility]                                                RE: AAKRITI, GOANS   [id number]   21-Sep-2061 PROGRESS NOTE: 10-29-18 Reason for visit: New evaluation of emergency room follow up for acute traumatic left ankle fracture on October 27, 2018.   History of present illness: This is an acute problem that began on October 27, 2018.  She was reaching for a snack in the kitchen and fell.  She presented to the emergency room via EMS where her comminuted trimalleolar fracture dislocation was reduced and splinted.  Referred for follow up.  Today her pain is controlled, although she has a history of narcotic abuse and was in a pain clinic, but has not had narcotic medicines regularly, per her report, since 2007.  She denies numbness or paresthesias.  She has a history of pre-diabetes, fatty liver, fibromyalgia and depression.  She vapes.  She has a Sulfa allergy.  EXAMINATION: Well appearing and in no apparent distress.  Left ankle splint in good condition on arrival.  Sensation is intact distally.  Calf compressible about the splint.    X-RAYS: None today-previous x-rays reviewed post splinting and reduction of her trimalleolar left ankle fracture.    ASSESSMENT/PLAN: Acute traumatic left ankle trimalleolar fracture.  We emphasized elevation to reduce swelling.  Prescription for lift chair provided.  She has a knee scooter and a wheelchair.  Details, risks and benefits of ORIF, left ankle, discussed at length with patient and her daughter today.  They verbalize understanding and wish to proceed-plan for now for one week to allow for swelling to reduce.    Jewel Baize.  Eulah Pont, M.D.  Dictated by: Avis Epley, PA-C Electronically verified by Jewel Baize Eulah Pont, M.D. TDM(HCM):jjh D 11-01-18 T 11-02-18

## 2018-11-03 NOTE — Progress Notes (Addendum)
Spoke with Sheria Lang patient daughter and confirmed surgery moved from [facility] to [facility] arrive 1030 11-05-2018 Npo after midnight meds to take sip of water: metoprolol, latuda, omeprazole, levothryoxine Driver daughter Sheria Lang cell [phone] Has surgery orders in epic  Needs I stat 4 and ekg See progress notes 11-02-2018 patient had anesthsia consult 11-02-2018 for bmi 45.87

## 2018-11-04 ENCOUNTER — Encounter: Payer: Self-pay | Admitting: Family Medicine

## 2018-11-04 DIAGNOSIS — K219 Gastro-esophageal reflux disease without esophagitis: Secondary | ICD-10-CM | POA: Insufficient documentation

## 2018-11-04 DIAGNOSIS — I10 Essential (primary) hypertension: Secondary | ICD-10-CM | POA: Insufficient documentation

## 2018-11-04 DIAGNOSIS — F329 Major depressive disorder, single episode, unspecified: Secondary | ICD-10-CM | POA: Insufficient documentation

## 2018-11-04 DIAGNOSIS — E119 Type 2 diabetes mellitus without complications: Secondary | ICD-10-CM | POA: Insufficient documentation

## 2018-11-05 ENCOUNTER — Ambulatory Visit (HOSPITAL_BASED_OUTPATIENT_CLINIC_OR_DEPARTMENT_OTHER): Payer: Medicare HMO | Admitting: Anesthesiology

## 2018-11-05 ENCOUNTER — Other Ambulatory Visit: Payer: Self-pay

## 2018-11-05 ENCOUNTER — Other Ambulatory Visit: Payer: Self-pay | Admitting: Family Medicine

## 2018-11-05 ENCOUNTER — Encounter (HOSPITAL_BASED_OUTPATIENT_CLINIC_OR_DEPARTMENT_OTHER): Payer: Self-pay

## 2018-11-05 ENCOUNTER — Encounter (HOSPITAL_COMMUNITY)
Admission: RE | Disposition: A | Discharge status: Skilled Nursing Facility | Payer: Self-pay | Source: Home / Self Care | Attending: Orthopedic Surgery

## 2018-11-05 ENCOUNTER — Ambulatory Visit (HOSPITAL_BASED_OUTPATIENT_CLINIC_OR_DEPARTMENT_OTHER): Admit: 2018-11-05 | Payer: Self-pay | Admitting: Orthopedic Surgery

## 2018-11-05 ENCOUNTER — Observation Stay (HOSPITAL_BASED_OUTPATIENT_CLINIC_OR_DEPARTMENT_OTHER)
Admission: RE | Admit: 2018-11-05 | Discharge: 2018-11-09 | Disposition: A | Discharge status: Skilled Nursing Facility | Payer: Medicare HMO | Attending: Orthopedic Surgery | Admitting: Orthopedic Surgery

## 2018-11-05 DIAGNOSIS — W1830XA Fall on same level, unspecified, initial encounter: Secondary | ICD-10-CM | POA: Diagnosis not present

## 2018-11-05 DIAGNOSIS — I10 Essential (primary) hypertension: Secondary | ICD-10-CM | POA: Diagnosis not present

## 2018-11-05 DIAGNOSIS — S82892D Other fracture of left lower leg, subsequent encounter for closed fracture with routine healing: Secondary | ICD-10-CM

## 2018-11-05 DIAGNOSIS — F17200 Nicotine dependence, unspecified, uncomplicated: Secondary | ICD-10-CM | POA: Diagnosis not present

## 2018-11-05 DIAGNOSIS — R2681 Unsteadiness on feet: Secondary | ICD-10-CM | POA: Insufficient documentation

## 2018-11-05 DIAGNOSIS — I159 Secondary hypertension, unspecified: Secondary | ICD-10-CM

## 2018-11-05 DIAGNOSIS — K219 Gastro-esophageal reflux disease without esophagitis: Secondary | ICD-10-CM | POA: Diagnosis not present

## 2018-11-05 DIAGNOSIS — S82852A Displaced trimalleolar fracture of left lower leg, initial encounter for closed fracture: Principal | ICD-10-CM | POA: Insufficient documentation

## 2018-11-05 DIAGNOSIS — E119 Type 2 diabetes mellitus without complications: Secondary | ICD-10-CM | POA: Diagnosis not present

## 2018-11-05 DIAGNOSIS — F339 Major depressive disorder, recurrent, unspecified: Secondary | ICD-10-CM

## 2018-11-05 DIAGNOSIS — F329 Major depressive disorder, single episode, unspecified: Secondary | ICD-10-CM

## 2018-11-05 DIAGNOSIS — S82892A Other fracture of left lower leg, initial encounter for closed fracture: Secondary | ICD-10-CM | POA: Diagnosis present

## 2018-11-05 DIAGNOSIS — R7303 Prediabetes: Secondary | ICD-10-CM

## 2018-11-05 DIAGNOSIS — E039 Hypothyroidism, unspecified: Secondary | ICD-10-CM

## 2018-11-05 DIAGNOSIS — F419 Anxiety disorder, unspecified: Secondary | ICD-10-CM

## 2018-11-05 DIAGNOSIS — F4310 Post-traumatic stress disorder, unspecified: Secondary | ICD-10-CM

## 2018-11-05 HISTORY — PX: ORIF ANKLE FRACTURE: SHX5408

## 2018-11-05 HISTORY — DX: Essential (primary) hypertension: I10

## 2018-11-05 HISTORY — DX: Gastro-esophageal reflux disease without esophagitis: K21.9

## 2018-11-05 LAB — CBC
HCT: 36.3 % (ref 36.0–46.0)
Hemoglobin: 10.7 g/dL — ABNORMAL LOW (ref 12.0–15.0)
MCH: 27 pg (ref 26.0–34.0)
MCHC: 29.5 g/dL — ABNORMAL LOW (ref 30.0–36.0)
MCV: 91.7 fL (ref 80.0–100.0)
PLATELETS: 295 10*3/uL (ref 150–400)
RBC: 3.96 MIL/uL (ref 3.87–5.11)
RDW: 16.6 % — ABNORMAL HIGH (ref 11.5–15.5)
WBC: 10.6 10*3/uL — ABNORMAL HIGH (ref 4.0–10.5)
nRBC: 0 % (ref 0.0–0.2)

## 2018-11-05 LAB — CREATININE, SERUM
Creatinine, Ser: 0.98 mg/dL (ref 0.44–1.00)
GFR calc Af Amer: >60 mL/min (ref >60)
GFR calc non Af Amer: >60 mL/min (ref >60)

## 2018-11-05 LAB — GLUCOSE, CAPILLARY
Glucose-Capillary: 107 mg/dL — ABNORMAL HIGH (ref 70–99)
Glucose-Capillary: 114 mg/dL — ABNORMAL HIGH (ref 70–99)
Glucose-Capillary: 109 mg/dL — ABNORMAL HIGH (ref 70–99)

## 2018-11-05 SURGERY — OPEN REDUCTION INTERNAL FIXATION (ORIF) ANKLE FRACTURE
Anesthesia: Choice | Laterality: Left

## 2018-11-05 SURGERY — OPEN REDUCTION INTERNAL FIXATION (ORIF) ANKLE FRACTURE
Anesthesia: General | Site: Ankle | Laterality: Left

## 2018-11-05 MED ORDER — FUROSEMIDE 20 MG PO TABS
20.0000 mg | ORAL_TABLET | Freq: Every day | ORAL | Status: DC
  Administered 2018-11-05 – 2018-11-09 (×4): 20 mg via ORAL
  Filled 2018-11-05 (×7): qty 1

## 2018-11-05 MED ORDER — MIDAZOLAM HCL 2 MG/2ML IJ SOLN
2.0000 mg | Freq: Once | INTRAMUSCULAR | Status: AC
  Administered 2018-11-05: 2 mg via INTRAVENOUS
  Filled 2018-11-05: qty 2

## 2018-11-05 MED ORDER — HYDROMORPHONE HCL 1 MG/ML IJ SOLN
0.5000 mg | INTRAMUSCULAR | Status: DC | PRN
  Administered 2018-11-05 – 2018-11-06 (×4): 1 mg via INTRAVENOUS
  Filled 2018-11-05 (×5): qty 1

## 2018-11-05 MED ORDER — LIDOCAINE 2% (20 MG/ML) 5 ML SYRINGE
INTRAMUSCULAR | Status: DC | PRN
  Administered 2018-11-05: 100 mg via INTRAVENOUS

## 2018-11-05 MED ORDER — PREGABALIN 50 MG PO CAPS
50.0000 mg | ORAL_CAPSULE | Freq: Three times a day (TID) | ORAL | Status: DC
  Administered 2018-11-05 – 2018-11-09 (×13): 50 mg via ORAL
  Filled 2018-11-05 (×14): qty 1

## 2018-11-05 MED ORDER — METHOCARBAMOL 500 MG PO TABS: 500.0000 mg | ORAL_TABLET | Freq: Three times a day (TID) | ORAL | 0 refills | Status: DC | PRN

## 2018-11-05 MED ORDER — GABAPENTIN 300 MG PO CAPS
300.0000 mg | ORAL_CAPSULE | Freq: Three times a day (TID) | ORAL | Status: DC
  Filled 2018-11-05 (×2): qty 1

## 2018-11-05 MED ORDER — GABAPENTIN 300 MG PO CAPS
300.0000 mg | ORAL_CAPSULE | Freq: Once | ORAL | Status: AC
  Administered 2018-11-05: 300 mg via ORAL
  Filled 2018-11-05: qty 1

## 2018-11-05 MED ORDER — ACETAMINOPHEN 160 MG/5ML PO SOLN
325.0000 mg | ORAL | Status: DC | PRN
  Filled 2018-11-05: qty 20.3

## 2018-11-05 MED ORDER — LORATADINE 10 MG PO TABS
10.0000 mg | ORAL_TABLET | Freq: Every day | ORAL | Status: DC
  Administered 2018-11-05 – 2018-11-09 (×5): 10 mg via ORAL
  Filled 2018-11-05 (×6): qty 1

## 2018-11-05 MED ORDER — LURASIDONE HCL 40 MG PO TABS
80.0000 mg | ORAL_TABLET | Freq: Every day | ORAL | Status: DC
  Administered 2018-11-05 – 2018-11-06 (×2): 80 mg via ORAL
  Filled 2018-11-05: qty 2
  Filled 2018-11-05: qty 1
  Filled 2018-11-05: qty 2

## 2018-11-05 MED ORDER — MIDAZOLAM HCL 2 MG/ML PO SYRP
2.0000 mg | ORAL_SOLUTION | Freq: Once | ORAL | Status: DC
  Filled 2018-11-05: qty 2

## 2018-11-05 MED ORDER — ASPIRIN EC 81 MG PO TBEC: 81.0000 mg | DELAYED_RELEASE_TABLET | Freq: Two times a day (BID) | ORAL | 0 refills | Status: DC

## 2018-11-05 MED ORDER — METOPROLOL SUCCINATE ER 50 MG PO TB24
50.0000 mg | ORAL_TABLET | Freq: Every day | ORAL | Status: DC
  Administered 2018-11-06 – 2018-11-09 (×4): 50 mg via ORAL
  Filled 2018-11-05 (×5): qty 1

## 2018-11-05 MED ORDER — MIDAZOLAM HCL 2 MG/2ML IJ SOLN
INTRAMUSCULAR | Status: AC
  Filled 2018-11-05: qty 2

## 2018-11-05 MED ORDER — CLONIDINE HCL (ANALGESIA) 100 MCG/ML EP SOLN
EPIDURAL | Status: DC | PRN
  Administered 2018-11-05: 100 ug

## 2018-11-05 MED ORDER — MEPERIDINE HCL 25 MG/ML IJ SOLN
6.2500 mg | INTRAMUSCULAR | Status: DC | PRN
  Filled 2018-11-05: qty 1

## 2018-11-05 MED ORDER — ACETAMINOPHEN 500 MG PO TABS: 1000.0000 mg | ORAL_TABLET | Freq: Three times a day (TID) | ORAL | 0 refills | Status: DC

## 2018-11-05 MED ORDER — AMITRIPTYLINE HCL 50 MG PO TABS
50.0000 mg | ORAL_TABLET | Freq: Two times a day (BID) | ORAL | Status: DC | PRN
  Administered 2018-11-05: 50 mg via ORAL
  Filled 2018-11-05 (×2): qty 1

## 2018-11-05 MED ORDER — KETOROLAC TROMETHAMINE 15 MG/ML IJ SOLN
15.0000 mg | Freq: Four times a day (QID) | INTRAMUSCULAR | Status: AC
  Administered 2018-11-05 – 2018-11-06 (×4): 15 mg via INTRAVENOUS
  Filled 2018-11-05 (×5): qty 1

## 2018-11-05 MED ORDER — ONDANSETRON HCL 4 MG PO TABS
4.0000 mg | ORAL_TABLET | Freq: Four times a day (QID) | ORAL | Status: DC | PRN
  Administered 2018-11-08: 4 mg via ORAL
  Filled 2018-11-05 (×2): qty 1

## 2018-11-05 MED ORDER — CEFAZOLIN SODIUM-DEXTROSE 2-4 GM/100ML-% IV SOLN
INTRAVENOUS | Status: AC
  Filled 2018-11-05: qty 100

## 2018-11-05 MED ORDER — CEFAZOLIN SODIUM-DEXTROSE 2-4 GM/100ML-% IV SOLN
2.0000 g | Freq: Four times a day (QID) | INTRAVENOUS | Status: AC
  Administered 2018-11-05 – 2018-11-06 (×3): 2 g via INTRAVENOUS
  Filled 2018-11-05 (×4): qty 100

## 2018-11-05 MED ORDER — OXYCODONE HCL 5 MG PO TABS
5.0000 mg | ORAL_TABLET | Freq: Once | ORAL | Status: DC | PRN
  Filled 2018-11-05: qty 1

## 2018-11-05 MED ORDER — ONDANSETRON HCL 4 MG PO TABS: 4.0000 mg | ORAL_TABLET | Freq: Three times a day (TID) | ORAL | 0 refills | Status: DC | PRN

## 2018-11-05 MED ORDER — ONDANSETRON HCL 4 MG/2ML IJ SOLN
INTRAMUSCULAR | Status: DC | PRN
  Administered 2018-11-05: 4 mg via INTRAVENOUS

## 2018-11-05 MED ORDER — PANTOPRAZOLE SODIUM 40 MG PO TBEC
40.0000 mg | DELAYED_RELEASE_TABLET | Freq: Every day | ORAL | Status: DC
  Administered 2018-11-06 – 2018-11-09 (×4): 40 mg via ORAL
  Filled 2018-11-05 (×5): qty 1

## 2018-11-05 MED ORDER — LACTATED RINGERS IV SOLN
INTRAVENOUS | Status: DC
  Administered 2018-11-05: 17:00:00 via INTRAVENOUS
  Filled 2018-11-05: qty 1000

## 2018-11-05 MED ORDER — FENTANYL CITRATE (PF) 100 MCG/2ML IJ SOLN
25.0000 ug | INTRAMUSCULAR | Status: DC | PRN
  Filled 2018-11-05: qty 1

## 2018-11-05 MED ORDER — PROPOFOL 10 MG/ML IV BOLUS
INTRAVENOUS | Status: DC | PRN
  Administered 2018-11-05: 250 mg via INTRAVENOUS
  Administered 2018-11-05: 50 mg via INTRAVENOUS

## 2018-11-05 MED ORDER — LIDOCAINE 2% (20 MG/ML) 5 ML SYRINGE
INTRAMUSCULAR | Status: AC
  Filled 2018-11-05: qty 5

## 2018-11-05 MED ORDER — GABAPENTIN 300 MG PO CAPS
ORAL_CAPSULE | ORAL | Status: AC
  Filled 2018-11-05: qty 1

## 2018-11-05 MED ORDER — BUPIVACAINE HCL (PF) 0.5 % IJ SOLN
INTRAMUSCULAR | Status: DC | PRN
  Administered 2018-11-05: 7 mL

## 2018-11-05 MED ORDER — ENOXAPARIN SODIUM 40 MG/0.4ML ~~LOC~~ SOLN
40.0000 mg | SUBCUTANEOUS | Status: DC
  Administered 2018-11-06 – 2018-11-09 (×4): 40 mg via SUBCUTANEOUS
  Filled 2018-11-05 (×5): qty 0.4

## 2018-11-05 MED ORDER — LINAGLIPTIN 5 MG PO TABS
5.0000 mg | ORAL_TABLET | Freq: Every day | ORAL | Status: DC
  Administered 2018-11-05 – 2018-11-09 (×5): 5 mg via ORAL
  Filled 2018-11-05 (×6): qty 1

## 2018-11-05 MED ORDER — ONDANSETRON HCL 4 MG/2ML IJ SOLN
4.0000 mg | Freq: Four times a day (QID) | INTRAMUSCULAR | Status: DC | PRN
  Administered 2018-11-05 – 2018-11-07 (×2): 4 mg via INTRAVENOUS
  Filled 2018-11-05 (×3): qty 2

## 2018-11-05 MED ORDER — ACETAMINOPHEN 325 MG PO TABS
325.0000 mg | ORAL_TABLET | ORAL | Status: DC | PRN
  Filled 2018-11-05: qty 2

## 2018-11-05 MED ORDER — POLYETHYLENE GLYCOL 3350 17 G PO PACK
17.0000 g | PACK | Freq: Every day | ORAL | Status: DC | PRN
  Filled 2018-11-05: qty 1

## 2018-11-05 MED ORDER — DOCUSATE SODIUM 100 MG PO CAPS: 100.0000 mg | ORAL_CAPSULE | Freq: Two times a day (BID) | ORAL | 0 refills | Status: DC

## 2018-11-05 MED ORDER — OXYCODONE HCL 5 MG PO TABS: 5.0000 mg | ORAL_TABLET | ORAL | 0 refills | Status: DC | PRN

## 2018-11-05 MED ORDER — LACTATED RINGERS IV SOLN
INTRAVENOUS | Status: DC
  Administered 2018-11-05: 1000 mL via INTRAVENOUS
  Filled 2018-11-05: qty 1000

## 2018-11-05 MED ORDER — ACETAMINOPHEN 500 MG PO TABS
1000.0000 mg | ORAL_TABLET | Freq: Once | ORAL | Status: AC
  Administered 2018-11-05: 1000 mg via ORAL
  Filled 2018-11-05: qty 2

## 2018-11-05 MED ORDER — ONDANSETRON HCL 4 MG/2ML IJ SOLN
4.0000 mg | Freq: Once | INTRAMUSCULAR | Status: DC | PRN
  Filled 2018-11-05: qty 2

## 2018-11-05 MED ORDER — METOCLOPRAMIDE HCL 5 MG/ML IJ SOLN
5.0000 mg | Freq: Three times a day (TID) | INTRAMUSCULAR | Status: DC | PRN
  Filled 2018-11-05: qty 2

## 2018-11-05 MED ORDER — DIPHENHYDRAMINE HCL 12.5 MG/5ML PO ELIX
12.5000 mg | ORAL_SOLUTION | ORAL | Status: DC | PRN
  Filled 2018-11-05 (×2): qty 10

## 2018-11-05 MED ORDER — FENTANYL CITRATE (PF) 100 MCG/2ML IJ SOLN
INTRAMUSCULAR | Status: AC
  Filled 2018-11-05: qty 2

## 2018-11-05 MED ORDER — DIAZEPAM 2 MG PO TABS
2.0000 mg | ORAL_TABLET | Freq: Two times a day (BID) | ORAL | Status: DC | PRN
  Administered 2018-11-06 – 2018-11-08 (×2): 2 mg via ORAL
  Filled 2018-11-05 (×3): qty 1

## 2018-11-05 MED ORDER — CEFAZOLIN SODIUM-DEXTROSE 2-4 GM/100ML-% IV SOLN
2.0000 g | INTRAVENOUS | Status: AC
  Administered 2018-11-05: 2 g via INTRAVENOUS
  Filled 2018-11-05: qty 100

## 2018-11-05 MED ORDER — LEVOTHYROXINE SODIUM 50 MCG PO TABS
50.0000 ug | ORAL_TABLET | Freq: Every day | ORAL | Status: DC
  Administered 2018-11-06 – 2018-11-09 (×4): 50 ug via ORAL
  Filled 2018-11-05 (×5): qty 1

## 2018-11-05 MED ORDER — METHOCARBAMOL 500 MG PO TABS
500.0000 mg | ORAL_TABLET | Freq: Four times a day (QID) | ORAL | Status: DC | PRN
  Administered 2018-11-05 – 2018-11-08 (×5): 500 mg via ORAL
  Filled 2018-11-05 (×6): qty 1

## 2018-11-05 MED ORDER — OXYCODONE HCL 5 MG PO TABS
5.0000 mg | ORAL_TABLET | ORAL | Status: DC | PRN
  Administered 2018-11-05 – 2018-11-09 (×19): 10 mg via ORAL
  Filled 2018-11-05 (×21): qty 2

## 2018-11-05 MED ORDER — LACTATED RINGERS IV SOLN
INTRAVENOUS | Status: DC
  Filled 2018-11-05: qty 1000

## 2018-11-05 MED ORDER — ONDANSETRON HCL 4 MG/2ML IJ SOLN
INTRAMUSCULAR | Status: AC
  Filled 2018-11-05: qty 2

## 2018-11-05 MED ORDER — PROPOFOL 10 MG/ML IV BOLUS
INTRAVENOUS | Status: AC
  Filled 2018-11-05: qty 40

## 2018-11-05 MED ORDER — ACETAMINOPHEN 325 MG PO TABS
325.0000 mg | ORAL_TABLET | Freq: Four times a day (QID) | ORAL | Status: DC | PRN
  Administered 2018-11-07 – 2018-11-09 (×3): 650 mg via ORAL
  Filled 2018-11-05 (×4): qty 2

## 2018-11-05 MED ORDER — ROPIVACAINE HCL 7.5 MG/ML IJ SOLN
INTRAMUSCULAR | Status: DC | PRN
  Administered 2018-11-05: 25 mL via PERINEURAL

## 2018-11-05 MED ORDER — FENTANYL CITRATE (PF) 100 MCG/2ML IJ SOLN
100.0000 ug | Freq: Once | INTRAMUSCULAR | Status: AC
  Administered 2018-11-05: 100 ug via INTRAVENOUS
  Filled 2018-11-05: qty 2

## 2018-11-05 MED ORDER — METHOCARBAMOL 1000 MG/10ML IJ SOLN
500.0000 mg | Freq: Four times a day (QID) | INTRAVENOUS | Status: DC | PRN
  Filled 2018-11-05: qty 5

## 2018-11-05 MED ORDER — DOCUSATE SODIUM 100 MG PO CAPS
100.0000 mg | ORAL_CAPSULE | Freq: Two times a day (BID) | ORAL | Status: DC
  Administered 2018-11-06 – 2018-11-09 (×7): 100 mg via ORAL
  Filled 2018-11-05 (×9): qty 1

## 2018-11-05 MED ORDER — ACETAMINOPHEN 500 MG PO TABS
1000.0000 mg | ORAL_TABLET | Freq: Three times a day (TID) | ORAL | Status: AC
  Administered 2018-11-05 – 2018-11-06 (×4): 1000 mg via ORAL
  Filled 2018-11-05 (×5): qty 2

## 2018-11-05 MED ORDER — CHLORHEXIDINE GLUCONATE 4 % EX LIQD
60.0000 mL | Freq: Once | CUTANEOUS | Status: DC
  Filled 2018-11-05: qty 118

## 2018-11-05 MED ORDER — ACETAMINOPHEN 500 MG PO TABS
ORAL_TABLET | ORAL | Status: AC
  Filled 2018-11-05: qty 2

## 2018-11-05 MED ORDER — METOCLOPRAMIDE HCL 5 MG PO TABS
5.0000 mg | ORAL_TABLET | Freq: Three times a day (TID) | ORAL | Status: DC | PRN
  Filled 2018-11-05: qty 2

## 2018-11-05 MED ORDER — OXYCODONE HCL 5 MG/5ML PO SOLN
5.0000 mg | Freq: Once | ORAL | Status: DC | PRN
  Filled 2018-11-05: qty 5

## 2018-11-05 SURGICAL SUPPLY — 79 items
1.4x150 kwire ×4 IMPLANT
3.5 drill bit ×2 IMPLANT
BANDAGE ACE 4X5 VEL STRL LF (GAUZE/BANDAGES/DRESSINGS) ×2 IMPLANT
BANDAGE ACE 6X5 VEL STRL LF (GAUZE/BANDAGES/DRESSINGS) ×2 IMPLANT
BANDAGE ELASTIC 4 VELCRO ST LF (GAUZE/BANDAGES/DRESSINGS) ×2 IMPLANT
BANDAGE ELASTIC 6 VELCRO ST LF (GAUZE/BANDAGES/DRESSINGS) ×2 IMPLANT
BANDAGE ESMARK 6X9 LF (GAUZE/BANDAGES/DRESSINGS) ×1 IMPLANT
BIT DRILL 2.5X125 (BIT) ×2 IMPLANT
BIT DRILL 3.5X125 (BIT) ×1 IMPLANT
BIT DRILL CANN 2.7 (BIT) ×1
BIT DRILL SRG 2.7XCANN AO CPLG (BIT) ×1 IMPLANT
BIT DRL SRG 2.7XCANN AO CPLNG (BIT) ×1
BLADE SURG 15 STRL LF DISP TIS (BLADE) ×2 IMPLANT
BLADE SURG 15 STRL SS (BLADE) ×2
BNDG COHESIVE 4X5 TAN STRL (GAUZE/BANDAGES/DRESSINGS) ×2 IMPLANT
BNDG ESMARK 6X9 LF (GAUZE/BANDAGES/DRESSINGS) ×2
BNDG GAUZE ELAST 4 BULKY (GAUZE/BANDAGES/DRESSINGS) ×2 IMPLANT
CHLORAPREP W/TINT 26ML (MISCELLANEOUS) ×4 IMPLANT
CLSR STERI-STRIP ANTIMIC 1/2X4 (GAUZE/BANDAGES/DRESSINGS) IMPLANT
COVER BACK TABLE 60X90IN (DRAPES) ×2 IMPLANT
COVER MAYO STAND STRL (DRAPES) ×2 IMPLANT
COVER WAND RF STERILE (DRAPES) ×2 IMPLANT
CUFF TOURNIQUET SINGLE 24IN (TOURNIQUET CUFF) IMPLANT
CUFF TOURNIQUET SINGLE 34IN LL (TOURNIQUET CUFF) ×2 IMPLANT
DECANTER SPIKE VIAL GLASS SM (MISCELLANEOUS) IMPLANT
DRAPE EXTREMITY T 121X128X90 (DISPOSABLE) ×2 IMPLANT
DRAPE IMP U-DRAPE 54X76 (DRAPES) ×2 IMPLANT
DRAPE OEC MINIVIEW 54X84 (DRAPES) ×2 IMPLANT
DRAPE U-SHAPE 47X51 STRL (DRAPES) ×2 IMPLANT
DRILL BIT 3.5X125 (BIT) ×1
DRSG EMULSION OIL 3X3 NADH (GAUZE/BANDAGES/DRESSINGS) ×4 IMPLANT
DRSG PAD ABDOMINAL 8X10 ST (GAUZE/BANDAGES/DRESSINGS) ×6 IMPLANT
ELECT REM PT RETURN 9FT ADLT (ELECTROSURGICAL) ×2
ELECTRODE REM PT RTRN 9FT ADLT (ELECTROSURGICAL) ×1 IMPLANT
GAUZE SPONGE 4X4 12PLY STRL (GAUZE/BANDAGES/DRESSINGS) ×2 IMPLANT
GLOVE BIO SURGEON STRL SZ7.5 (GLOVE) ×4 IMPLANT
GLOVE BIOGEL PI IND STRL 8 (GLOVE) ×2 IMPLANT
GLOVE BIOGEL PI INDICATOR 8 (GLOVE) ×2
GOWN STRL REUS W/ TWL LRG LVL3 (GOWN DISPOSABLE) ×2 IMPLANT
GOWN STRL REUS W/ TWL XL LVL3 (GOWN DISPOSABLE) ×1 IMPLANT
GOWN STRL REUS W/TWL LRG LVL3 (GOWN DISPOSABLE) ×2
GOWN STRL REUS W/TWL XL LVL3 (GOWN DISPOSABLE) ×1
GUIDE WIRE UNTHRD 1.4X150 (Wire) ×2 IMPLANT
GUIDEWIRE UNTHRD 1.4X150 (Wire) ×1 IMPLANT
NEEDLE HYPO 22GX1.5 SAFETY (NEEDLE) IMPLANT
NS IRRIG 1000ML POUR BTL (IV SOLUTION) ×2 IMPLANT
PACK BASIN DAY SURGERY FS (CUSTOM PROCEDURE TRAY) ×2 IMPLANT
PAD ABD 8X10 STRL (GAUZE/BANDAGES/DRESSINGS) ×2 IMPLANT
PAD CAST 4YDX4 CTTN HI CHSV (CAST SUPPLIES) ×2 IMPLANT
PADDING CAST ABS 4INX4YD NS (CAST SUPPLIES) ×1
PADDING CAST ABS COTTON 4X4 ST (CAST SUPPLIES) ×1 IMPLANT
PADDING CAST COTTON 4X4 STRL (CAST SUPPLIES) ×2
PADDING CAST COTTON 6X4 STRL (CAST SUPPLIES) ×2 IMPLANT
PENCIL BUTTON HOLSTER BLD 10FT (ELECTRODE) ×2 IMPLANT
PLATE 1/3 TUBULAR 7H (Plate) ×2 IMPLANT
SCREW CANCELLOUS 4.0X14 (Screw) ×4 IMPLANT
SCREW CANN ASNIS III 4.0X40MM (Screw) ×4 IMPLANT
SCREW CORTEX ST MATTA 3.5X12MM (Screw) ×2 IMPLANT
SCREW CORTEX ST MATTA 3.5X14 (Screw) ×4 IMPLANT
SCREW CORTEX ST MATTA 3.5X16MM (Screw) ×2 IMPLANT
SCREW CORTEX ST MATTA 3.5X20 (Screw) ×2 IMPLANT
SPLINT FAST PLASTER 5X30 (CAST SUPPLIES) ×10
SPLINT PLASTER CAST FAST 5X30 (CAST SUPPLIES) ×10 IMPLANT
SPONGE LAP 4X18 RFD (DISPOSABLE) ×2 IMPLANT
SUCTION FRAZIER HANDLE 10FR (MISCELLANEOUS) ×1
SUCTION TUBE FRAZIER 10FR DISP (MISCELLANEOUS) ×1 IMPLANT
SUT ETHILON 3 0 PS 1 (SUTURE) ×2 IMPLANT
SUT MNCRL AB 4-0 PS2 18 (SUTURE) IMPLANT
SUT MON AB 2-0 CT1 36 (SUTURE) IMPLANT
SUT MON AB 3-0 SH 27 (SUTURE)
SUT MON AB 3-0 SH27 (SUTURE) IMPLANT
SUT VIC AB 0 SH 27 (SUTURE) ×2 IMPLANT
SUT VIC AB 2-0 SH 27 (SUTURE) ×1
SUT VIC AB 2-0 SH 27XBRD (SUTURE) ×1 IMPLANT
SYR BULB 3OZ (MISCELLANEOUS) ×2 IMPLANT
SYR CONTROL 10ML LL (SYRINGE) IMPLANT
TOWEL OR 17X26 10 PK STRL BLUE (TOWEL DISPOSABLE) ×4 IMPLANT
TUBE CONNECTING 12X1/4 (SUCTIONS) ×2 IMPLANT
UNDERPAD 30X30 (UNDERPADS AND DIAPERS) ×2 IMPLANT

## 2018-11-05 NOTE — Anesthesia Preprocedure Evaluation (Addendum)
Anesthesia Evaluation  Patient identified by MRN, date of birth, ID band Patient awake    Reviewed: Allergy & Precautions, H&P , NPO status , Patient's Chart, lab work & pertinent test results, reviewed documented beta blocker date and time   Airway Mallampati: II  TM Distance: >3 FB Neck ROM: full    Dental no notable dental hx.    Pulmonary neg pulmonary ROS, Current Smoker,    Pulmonary exam normal breath sounds clear to auscultation       Cardiovascular Exercise Tolerance: Good hypertension, Pt. on medications and Pt. on home beta blockers negative cardio ROS   Rhythm:regular Rate:Normal     Neuro/Psych  Headaches, PSYCHIATRIC DISORDERS Anxiety Depression    GI/Hepatic Neg liver ROS, GERD  Medicated,  Endo/Other  diabetes, Type 2Hypothyroidism   Renal/GU negative Renal ROS  negative genitourinary   Musculoskeletal   Abdominal   Peds  Hematology  (+) Blood dyscrasia, anemia ,   Anesthesia Other Findings   Reproductive/Obstetrics negative OB ROS                             Anesthesia Physical Anesthesia Plan  ASA: III  Anesthesia Plan: General   Post-op Pain Management: GA combined w/ Regional for post-op pain   Induction:   PONV Risk Score and Plan: 3 and Ondansetron, Treatment may vary due to age or medical condition, Dexamethasone and Midazolam  Airway Management Planned: Oral ETT and LMA  Additional Equipment:   Intra-op Plan:   Post-operative Plan: Extubation in OR  Informed Consent: I have reviewed the patients History and Physical, chart, labs and discussed the procedure including the risks, benefits and alternatives for the proposed anesthesia with the patient or authorized representative who has indicated his/her understanding and acceptance.     Dental Advisory Given  Plan Discussed with: CRNA and Anesthesiologist  Anesthesia Plan Comments: (Discussed both  nerve block for pain relief post-op and GA; including NV, sore throat, dental injury, and pulmonary complications)       Anesthesia Quick Evaluation

## 2018-11-05 NOTE — Anesthesia Procedure Notes (Addendum)
Anesthesia Regional Block: Popliteal block   Pre-Anesthetic Checklist: ,, timeout performed, Correct Patient, Correct Site, Correct Laterality, Correct Procedure, Correct Position, site marked, Risks and benefits discussed,  Surgical consent,  Pre-op evaluation,  At surgeon's request and post-op pain management  Laterality: Left  Prep: chloraprep       Needles:  Injection technique: Single-shot  Needle Type: Echogenic Stimulator Needle     Needle Length: 5cm  Needle Gauge: 22     Additional Needles:   Procedures:, nerve stimulator,,, ultrasound used (permanent image in chart),,,,   Nerve Stimulator or Paresthesia:  Response: LE, 0.45 mA,   Additional Responses:   Narrative:  Start time: 11/05/2018 11:28 AM End time: 11/05/2018 11:32 AM Injection made incrementally with aspirations every 5 mL.  Performed by: Personally  Anesthesiologist: Bethena Midget, MD  Additional Notes: Functioning IV was confirmed and monitors were applied.  A 95mm 22ga Arrow echogenic stimulator needle was used. Sterile prep and drape,hand hygiene and sterile gloves were used. Ultrasound guidance: relevant anatomy identified, needle position confirmed, local anesthetic spread visualized around nerve(s)., vascular puncture avoided.  Image printed for medical record. Negative aspiration and negative test dose prior to incremental administration of local anesthetic. The patient tolerated the procedure well.

## 2018-11-05 NOTE — Discharge Instructions (Signed)
Elevate leg - Toes above nose at all times to reduce pain / swelling.  Weight Bearing:  Non weight bearing affected leg.  Diet: As you were doing prior to hospitalization   Shower:  You have a splint on, leave the splint in place and keep the splint dry with a plastic bag.  Dressing:  You have a splint. Leave the splint in place and we will change your bandages during your first follow-up appointment.    Activity:  Increase activity slowly as tolerated, but follow the weight bearing instructions below.  The rules on driving is that you can not be taking narcotics while you drive, and you must feel in control of the vehicle.    To prevent constipation:  Narcotic medicines cause constipation.  Wean these as soon as is appropriate.   You may use a stool softener such as -  Colace (over the counter) 100 mg by mouth twice a day  Drink plenty of fluids (prune juice may be helpful) and high fiber foods Miralax (over the counter) for constipation as needed.    Itching:  If you experience itching with your medications, try taking only a single pain pill, or even half a pain pill at a time.  You can also use benadryl over the counter for itching or also to help with sleep.   Precautions:  If you experience chest pain or shortness of breath - call 911 immediately for transfer to the hospital emergency department!!  If you develop a fever greater that 101 F, purulent drainage from wound, increased redness or drainage from wound, or calf pain -- Call the office at [phone]                                                 Follow- Up Appointment:  Please call for an appointment to be seen in 2 weeks [place] - [phone]

## 2018-11-05 NOTE — Op Note (Signed)
11/05/2018  1:39 PM  PATIENT:  Tiffany Reese    PRE-OPERATIVE DIAGNOSIS:  LEFT ANKLE FRACTURE  POST-OPERATIVE DIAGNOSIS:  Same  PROCEDURE:  OPEN REDUCTION INTERNAL FIXATION (ORIF) LEFT TRIMALLEOLAR ANKLE FRACTURE WITH OR WITHOUT FIXATION OF POSTERIOR LIP, XR 3V LEFT ANKLE  SURGEON:  Sheral Apley, MD  ASSISTANT: Aquilla Hacker, PA-C, he was present and scrubbed throughout the case, critical for completion in a timely fashion, and for retraction, instrumentation, and closure.   ANESTHESIA:   gen   PREOPERATIVE INDICATIONS:  [name] is a  58 y.o. female with a diagnosis of LEFT ANKLE FRACTURE who failed conservative measures and elected for surgical management.    The risks benefits and alternatives were discussed with the patient preoperatively including but not limited to the risks of infection, bleeding, nerve injury, cardiopulmonary complications, the need for revision surgery, among others, and the patient was willing to proceed.  OPERATIVE IMPLANTS: stryker plate and can screws  OPERATIVE FINDINGS: Unstable ankle fracture. Stable syndesmosis post op  BLOOD LOSS: min  COMPLICATIONS: none  TOURNIQUET TIME:  OPERATIVE PROCEDURE:  Patient was identified in the preoperative holding area and site was marked by me He was transported to the operating theater and placed on the table in supine position taking care to pad all bony prominences. After a preincinduction time out anesthesia was induced. The left lower extremity was prepped and draped in normal sterile fashion and a pre-incision timeout was performed. Tiffany Chestnut Mikus received ancef for preoperative antibiotics.   I made a lateral incision of roughly 7 cm dissection was carried down sharply to the distal fibula and then spreading dissection was used proximally to protect the superficial peroneal nerve. I sharply incised the periosteum and took care to protect the peroneal tendons. I then debrided  the fracture site and performed a reduction maneuver which was held in place with a clamp.   I placed a lag screw across the fracture  I then selected a 7-hole one third tubular plate and placed in a neutralization fashion care was taken distally so as not to penetrate the joint with the cancellus screws.  I then turned my attention medially where I created a 4 cm incision and dissected sharply down to the medial Mal fracture taking care to protect the saphenous vein. I debrided the fracture and reduced and held in place with a tenaculum. I then drilled and placed 2 partially threaded 45 mm cannulated screws one anterior and one posterior across the fracture.  I then stressed the syndesmosis and  for syndesmotic fixation I performed a reduction maneuver with a clamp and placed a quad cortical screw  I assesed the posterior mal piece and it was small enough to not require fixation as it involved less than 20% of the articular surface  The wound was then thoroughly irrigated and closed using a 0 Vicryl and absorbable Monocryl sutures. He was placed in a short leg splint.   POST OPERATIVE PLAN: Non-weightbearing. DVT prophylaxis will consist of mobilization and chemical px

## 2018-11-05 NOTE — Progress Notes (Signed)
Time out completed.Patient tolerated ankle blocks well.

## 2018-11-05 NOTE — Anesthesia Procedure Notes (Signed)
Procedure Name: LMA Insertion Date/Time: 11/05/2018 12:13 PM Performed by: Briant Sites, CRNA Pre-anesthesia Checklist: Patient identified, Emergency Drugs available, Suction available and Patient being monitored Patient Re-evaluated:Patient Re-evaluated prior to induction Oxygen Delivery Method: Circle system utilized Preoxygenation: Pre-oxygenation with 100% oxygen Induction Type: IV induction LMA: LMA with gastric port inserted LMA Size: 4.0 Number of attempts: 1 Airway Equipment and Method: Bite block Placement Confirmation: positive ETCO2 Tube secured with: Tape Dental Injury: Teeth and Oropharynx as per pre-operative assessment

## 2018-11-05 NOTE — Transfer of Care (Signed)
Immediate Anesthesia Transfer of Care Note  Patient: Tiffany Reese  Procedure(s) Performed: OPEN REDUCTION INTERNAL FIXATION (ORIF) LEFT TRIMALLEOLAR ANKLE FRACTURE WITH OR WITHOUT FIXATION OF POSTERIOR LIP, XR 3V LEFT ANKLE (Left Ankle)  Patient Location: PACU  Anesthesia Type:General  Level of Consciousness: awake, alert  and oriented  Airway & Oxygen Therapy: Patient Spontanous Breathing and Patient connected to face mask oxygen  Post-op Assessment: Report given to RN  Post vital signs: Reviewed and stable  Last Vitals:  Vitals Value Taken Time  BP 116/71 11/05/2018  1:58 PM  Temp    Pulse 74 11/05/2018  1:59 PM  Resp 15 11/05/2018  1:59 PM  SpO2 95 % 11/05/2018  1:59 PM  Vitals shown include unvalidated device data.  Last Pain:  Vitals:   11/05/18 1044  TempSrc:   PainSc: 7       Patients Stated Pain Goal: 6 (11/05/18 1044)  Complications: No apparent anesthesia complications

## 2018-11-05 NOTE — Anesthesia Postprocedure Evaluation (Signed)
Anesthesia Post Note  Patient: Tiffany Reese  Procedure(s) Performed: OPEN REDUCTION INTERNAL FIXATION (ORIF) LEFT TRIMALLEOLAR ANKLE FRACTURE WITH OR WITHOUT FIXATION OF POSTERIOR LIP, XR 3V LEFT ANKLE (Left Ankle)     Patient location during evaluation: PACU Anesthesia Type: General Level of consciousness: awake Pain management: pain level controlled Vital Signs Assessment: post-procedure vital signs reviewed and stable Respiratory status: spontaneous breathing Cardiovascular status: stable Postop Assessment: no apparent nausea or vomiting Anesthetic complications: no    Last Vitals:  Vitals:   11/05/18 1445 11/05/18 1500  BP: 126/83 122/73  Pulse: 67 69  Resp: 14 14  Temp:    SpO2: 95% 93%    Last Pain:  Vitals:   11/05/18 1515  TempSrc:   PainSc: 0-No pain   Pain Goal: Patients Stated Pain Goal: 6 (11/05/18 1044)                 Caren Macadam

## 2018-11-05 NOTE — Interval H&P Note (Signed)
I participated in the care of this patient and agree with the above history, physical and evaluation. I performed a review of the history and a physical exam as detailed   Yehya Brendle Daniel Doraine Schexnider MD  

## 2018-11-06 DIAGNOSIS — S82852A Displaced trimalleolar fracture of left lower leg, initial encounter for closed fracture: Secondary | ICD-10-CM | POA: Diagnosis not present

## 2018-11-06 LAB — GLUCOSE, CAPILLARY
GLUCOSE-CAPILLARY: 117 mg/dL — AB (ref 70–99)
GLUCOSE-CAPILLARY: 100 mg/dL — AB (ref 70–99)
Glucose-Capillary: 65 mg/dL — ABNORMAL LOW (ref 70–99)

## 2018-11-06 MED ORDER — HYDROMORPHONE HCL 1 MG/ML IJ SOLN
0.5000 mg | INTRAMUSCULAR | Status: DC | PRN
  Administered 2018-11-06 – 2018-11-07 (×4): 0.5 mg via INTRAVENOUS
  Filled 2018-11-06 (×4): qty 0.5

## 2018-11-06 MED ORDER — AMITRIPTYLINE HCL 50 MG PO TABS
150.0000 mg | ORAL_TABLET | Freq: Every evening | ORAL | Status: DC | PRN
  Administered 2018-11-06: 150 mg via ORAL
  Administered 2018-11-08: 100 mg via ORAL
  Filled 2018-11-06 (×3): qty 1

## 2018-11-06 MED ORDER — LURASIDONE HCL 40 MG PO TABS
80.0000 mg | ORAL_TABLET | Freq: Every day | ORAL | Status: DC
  Administered 2018-11-07 – 2018-11-08 (×2): 80 mg via ORAL
  Filled 2018-11-06 (×3): qty 2

## 2018-11-06 NOTE — Progress Notes (Signed)
Orthopedic Progress Note: S: [name] underwent ORIF of her left ankle 11/05/2018 by Dr. Eulah Pont at [facility] long Outpatient surgical center.  The anesthesia team recommended overnight stay/observation as the patient did not have adequate care overnight.  The plan was for her to discharge in the morning.  PT OT evaluation was required before discharge.  I spoke with the nursing team this afternoon.  She was informed by physical therapy that the patient will not be cleared for discharge today.  They recommend skilled nursing placement.  O:  AFVSN Blood pressure 109/66, pulse 78, temperature 98.6 F (37 C), temperature source Oral, resp. rate 17, height 5\' 6"  (1.676 m), weight 127.9 kg, SpO2 95 %.  A/P: Patient Active Problem List   Diagnosis Date Noted  . GERD (gastroesophageal reflux disease)   . Hypertension   . Diabetes mellitus type II, controlled (HCC)   . Depression, major   . PTSD (post-traumatic stress disorder) 11/01/2018  . Anxiety 11/01/2018  . Depression, recurrent (HCC) 11/01/2018  . Closed left ankle fracture 11/01/2018  . Prediabetes 11/01/2018  . Anemia 11/01/2018  . Hypothyroid 11/01/2018   Left ankle fracture, status post ORIF The patient failed to mobilize with therapy today.  SNF was recommended.   Continue therapies/mobilization  Elevate foot above heart at all times to reduce swelling and pain  NWB LLE  Incentive spirometry  Lovenox for DVT prophylaxis while inpatient.  ASA 81 mg BID after discharge for 30 days.  Wean IV narcotics.  Patient has a history of narcotic dependence.   Tiffany Billet III, PA-C 11/06/2018 2:08 PM

## 2018-11-06 NOTE — Evaluation (Signed)
Physical Therapy Evaluation Patient Details Name: Tiffany Reese MRN: [id number] DOB: January 13, 1961 Today's Date: 11/06/2018   History of Present Illness  58 yo female admitted for ORIF forLeft fib fracture sustained  1 week ago. patient has been using WC. has fallen  again prior to surgery.  Clinical Impression  The patient  Reports falling prior to surgery since fracture.The patient has limited  Assistance. Patient  Can benefit from SNF prior to Dc. Pt admitted with above diagnosis. Pt currently with functional limitations due to the deficits listed below (see PT Problem List). Pt will benefit from skilled PT to increase their independence and safety with mobility to allow discharge to the venue listed below.       Follow Up Recommendations SNF    Equipment Recommendations  None recommended by PT    Recommendations for Other Services       Precautions / Restrictions Precautions Precautions: Fall Restrictions LLE Weight Bearing: Non weight bearing      Mobility  Bed Mobility Overal bed mobility: Needs Assistance Bed Mobility: Supine to Sit;Sit to Supine     Supine to sit: Min guard Sit to supine: Min guard      Transfers Overall transfer level: Needs assistance Equipment used: Rolling walker (2 wheeled) Transfers: Sit to/from [employer] Sit to Stand: Mod assist   Squat pivot transfers: Mod assist     General transfer comment: extra time  to set up the [facility], Attempted to stand , too unsteady. Instructed to not stand but to stay lower center. Patient required steady assist to pivot to [facility] then back to bed.   Ambulation/Gait                Administrator mobility: Yes Wheelchair propulsion: Both upper extremities;Right lower extremity Wheelchair parts: Supervision/cueing Distance: patient propels on levels, assist to turn and get close to bed. Assist with leg rest.  Modified  Rankin (Stroke Patients Only)       Balance                                             Pertinent Vitals/Pain Pain Assessment: 0-10 Pain Score: 4  Pain Location: left ankle Pain Descriptors / Indicators: Aching;Discomfort Pain Intervention(s): Premedicated before session;Monitored during session;Ice applied;Repositioned    Home Living Family/patient expects to be discharged to:: Private residence Living Arrangements: Alone Available Help at Discharge: Family Type of Home: House Home Access: Level entry     Home Layout: One level Home Equipment: Wheelchair - [employer] - 2 wheels      Prior Function           Comments: patient has been usung WC since fracture. Has to hop into BR as WC will not fit.     Hand Dominance        Extremity/Trunk Assessment   Upper Extremity Assessment Upper Extremity Assessment: Defer to OT evaluation    Lower Extremity Assessment Lower Extremity Assessment: LLE deficits/detail LLE Deficits / Details: able to lift leg from bed.    Cervical / Trunk Assessment Cervical / Trunk Assessment: Normal  Communication   Communication: No difficulties  Cognition Arousal/Alertness: Awake/alert Behavior During Therapy: WFL for tasks assessed/performed Overall Cognitive Status: Within Functional Limits for tasks assessed  General Comments      Exercises     Assessment/Plan    PT Assessment Patient needs continued PT services  PT Problem List Decreased strength;Decreased activity tolerance;Decreased knowledge of use of DME;Decreased safety awareness;Decreased balance;Decreased mobility;Decreased knowledge of precautions;Pain       PT Treatment Interventions DME instruction;Functional mobility training;Therapeutic activities;Patient/family education;Wheelchair mobility training    PT Goals (Current goals can be found in the Care Plan section)  Acute Rehab  PT Goals Patient Stated Goal: to be able to get home and not fall. PT Goal Formulation: With patient Time For Goal Achievement: 11/13/18 Potential to Achieve Goals: Good    Frequency Min 3X/week   Barriers to discharge Decreased caregiver support      Co-evaluation               AM-PAC PT "6 Clicks" Mobility  Outcome Measure Help needed turning from your back to your side while in a flat bed without using bedrails?: A Little Help needed moving from lying on your back to sitting on the side of a flat bed without using bedrails?: A Little Help needed moving to and from a bed to a chair (including a wheelchair)?: Total Help needed standing up from a chair using your arms (e.g., wheelchair or bedside chair)?: Total Help needed to walk in hospital room?: Total Help needed climbing 3-5 steps with a railing? : Total 6 Click Score: 10    End of Session Equipment Utilized During Treatment: Gait belt Activity Tolerance: Patient tolerated treatment well Patient left: in bed;with call bell/phone within reach Nurse Communication: Mobility status PT Visit Diagnosis: Unsteadiness on feet (R26.81);Pain Pain - Right/Left: Left Pain - part of body: Ankle and joints of foot    Time: 1445-1538 PT Time Calculation (min) (ACUTE ONLY): 53 min   Charges:   PT Evaluation $PT Eval Low Complexity: 1 Low PT Treatments $Therapeutic Activity: 8-22 mins $Self Care/Home Management: 8-22 $Wheel Chair Management: 8-22 mins        Blanchard Kelch PT Acute Rehabilitation Services Pager [phone] Office [phone]   Rada Hay 11/06/2018, 5:01 PM

## 2018-11-06 NOTE — Care Management Note (Signed)
Case Management Note  Patient Details  Name: Genesee Affeldt MRN: [id number] Date of Birth: 1961-09-11  Subjective/Objective:   ORIF left fib fx                 Action/Plan:  Spoke to pt and states she lives alone, but dtr assist as needed. States she prefers to go to rehab. CSW referral for SNF placement.   Expected Discharge Date:                  Expected Discharge Plan:  Skilled Nursing Facility  In-House Referral:  Clinical Social Work  Discharge planning Services  CM Consult  Post Acute Care Choice:  NA Choice offered to:  NA  DME Arranged:  N/A DME Agency:  NA  HH Arranged:  NA HH Agency:  NA  Status of Service:  Completed, signed off  If discussed at Long Length of Stay Meetings, dates discussed:    Additional Comments:  Elliot Cousin, RN 11/06/2018, 5:40 PM

## 2018-11-07 DIAGNOSIS — S82852A Displaced trimalleolar fracture of left lower leg, initial encounter for closed fracture: Secondary | ICD-10-CM | POA: Diagnosis not present

## 2018-11-07 LAB — GLUCOSE, CAPILLARY
Glucose-Capillary: 104 mg/dL — ABNORMAL HIGH (ref 70–99)
Glucose-Capillary: 119 mg/dL — ABNORMAL HIGH (ref 70–99)
Glucose-Capillary: 117 mg/dL — ABNORMAL HIGH (ref 70–99)
Glucose-Capillary: 129 mg/dL — ABNORMAL HIGH (ref 70–99)

## 2018-11-07 MED ORDER — IBUPROFEN 400 MG PO TABS
800.0000 mg | ORAL_TABLET | Freq: Three times a day (TID) | ORAL | Status: DC
  Administered 2018-11-07 – 2018-11-09 (×8): 800 mg via ORAL
  Filled 2018-11-07 (×8): qty 2

## 2018-11-07 NOTE — Progress Notes (Addendum)
Orthopedic Progress Note: S: No significant changes overnight.  Patient continues to complain of severe pain although clinically this morning very comfortable/sound asleep on arrival.  Discussed elevating at all times.  Ms. Rardon underwent ORIF of her left ankle 11/05/2018 by Dr. Eulah Pont at [facility] long Outpatient surgical center.  The anesthesia team recommended overnight stay/observation as the patient did not have adequate care overnight.  The plan was for her to discharge in the morning.  PT OT evaluation was required before discharge.  I spoke with the nursing team 11/06/18.  She was informed by physical therapy that the patient will not be cleared for discharge to home.  SNF recommended.  O:  AFVSN Blood pressure 119/72, pulse 93, temperature 98.3 F (36.8 C), temperature source Oral, resp. rate 16, height 5\' 6"  (1.676 m), weight 127.9 kg, SpO2 92 %.  PE: General: Sound asleep on arrival.  Awakes calm and conversant.  NAD No increased work of breathing RRR LLE: Splint in place and in good condition elevated on pillows. Toes warm.  EHL and FHL intact.  Calf compressible around splint.   A/P: Patient Active Problem List   Diagnosis Date Noted  . GERD (gastroesophageal reflux disease)   . Hypertension   . Diabetes mellitus type II, controlled (HCC)   . Depression, major   . PTSD (post-traumatic stress disorder) 11/01/2018  . Anxiety 11/01/2018  . Depression, recurrent (HCC) 11/01/2018  . Closed left ankle fracture 11/01/2018  . Prediabetes 11/01/2018  . Anemia 11/01/2018  . Hypothyroid 11/01/2018   Left ankle fracture, status post ORIF The patient failed to mobilize with therapy.  Not cleared for discharge to home.  SNF was recommended.   Continue therapies/mobilization  Elevate foot above heart at all times to reduce swelling and pain  NWB LLE  Incentive spirometry  Lovenox for DVT prophylaxis while inpatient.  ASA 81 mg BID after discharge for 30  days.  Discontinue IV narcotics.  Emphasize longer lasting p.o. meds.  Distraction and other means of pain management.  Patient has a history of narcotic dependence.  Start ibuprofen 3 times daily.  Pantoprazole for gastric protection (she takes omeprazole chronically).    Dispo: Discharge to SNF   Albina Billet III, PA-C 11/07/2018 7:49 AM

## 2018-11-07 NOTE — Evaluation (Signed)
Occupational Therapy Evaluation Patient Details Name: Tiffany Reese MRN: [id number] DOB: 11-22-1960 Today's Date: 11/07/2018    History of Present Illness 58 yo female admitted for ORIF forLeft fib fracture sustained  1 week ago. patient has been using WC. has fallen  again prior to surgery.   Clinical Impression   PNT IS A 57 Y.O FEMALE WHO HAS DECREASED I AND SAFETY WITH ADLS AND ADL TRANSFERS. PATIENT LIVES ALONE AND DOES NOT HAVE SUPPORT AT HOME. PATIENT IS AN AGREEMENT THAT SHE NEEDS ST SNF FOR REHAB. PATIENT TO BE FOLLOWED ACUTELY UNTIL D/C TO SNF.     Follow Up Recommendations  SNF    Equipment Recommendations  (CAN BE ORDERED AT NEXT VENUE OF CARE. )    Recommendations for Other Services       Precautions / Restrictions Precautions Precautions: Fall Restrictions LLE Weight Bearing: Non weight bearing      Mobility Bed Mobility         Supine to sit: Min guard Sit to supine: Min guard      Transfers                      Balance                                           ADL either performed or assessed with clinical judgement   ADL Overall ADL's : Needs assistance/impaired Eating/Feeding: Independent   Grooming: Wash/dry hands;Wash/dry face;Set up   Upper Body Bathing: Supervision/ safety;Set up;Sitting   Lower Body Bathing: Minimal assistance;Sit to/from stand   Upper Body Dressing : Supervision/safety;Set up;Sitting   Lower Body Dressing: Moderate assistance;Sit to/from stand               Functional mobility during ADLs: (PATIENT REFUSED TO GET OUT OF BED. ) General ADL Comments: PATIENT WILL NEED FURTHER OT TO INCREASE I AND SAFETY WTIH TASK.      Vision Baseline Vision/History: Wears glasses Wears Glasses: At all times Patient Visual Report: No change from baseline       Perception     Praxis      Pertinent Vitals/Pain Pain Assessment: 0-10 Pain Score: 7  Pain Location: left ankle Pain  Descriptors / Indicators: Throbbing;Sore;Burning;Stabbing     Hand Dominance Right   Extremity/Trunk Assessment Upper Extremity Assessment Upper Extremity Assessment: Overall WFL for tasks assessed           Communication Communication Communication: No difficulties   Cognition Arousal/Alertness: Awake/alert Behavior During Therapy: WFL for tasks assessed/performed Overall Cognitive Status: Within Functional Limits for tasks assessed                                     General Comments       Exercises     Shoulder Instructions      Home Living Family/patient expects to be discharged to:: Skilled nursing facility                                        Prior Functioning/Environment Level of Independence: Independent        Comments: patient has been usung WC since fracture. Has to hop into BR as WC will not  fit.        OT Problem List: Decreased activity tolerance;Decreased knowledge of use of DME or AE;Pain      OT Treatment/Interventions: Self-care/ADL training;DME and/or AE instruction;Therapeutic activities;Patient/family education    OT Goals(Current goals can be found in the care plan section) Acute Rehab OT Goals Patient Stated Goal: TO GO TO REHAB. PATIENT IS AFRAID OF FALLING. OT Goal Formulation: With patient Time For Goal Achievement: 11/21/18 Potential to Achieve Goals: Good ADL Goals Pt Will Perform Lower Body Bathing: with supervision Pt Will Perform Lower Body Dressing: with supervision Pt Will Transfer to Toilet: with supervision Pt Will Perform Toileting - Clothing Manipulation and hygiene: with supervision  OT Frequency: Min 2X/week   Barriers to D/C: Decreased caregiver support  PATIENT LIVES ALONE AND HAS LIMITED ASSIST AVAILABLE.        Co-evaluation              AM-PAC OT "6 Clicks" Daily Activity     Outcome Measure Help from another person eating meals?: None Help from another person  taking care of personal grooming?: A Little Help from another person toileting, which includes using toliet, bedpan, or urinal?: A Lot Help from another person bathing (including washing, rinsing, drying)?: A Lot Help from another person to put on and taking off regular upper body clothing?: A Little Help from another person to put on and taking off regular lower body clothing?: A Lot 6 Click Score: 16   End of Session Equipment Utilized During Treatment: Gait belt;Rolling walker Nurse Communication: Patient requests pain meds  Activity Tolerance: Patient tolerated treatment well;Patient limited by pain Patient left:    OT Visit Diagnosis: Unsteadiness on feet (R26.81);Repeated falls (R29.6)                Time: 6440-3474 OT Time Calculation (min): 40 min Charges:  OT General Charges $OT Visit: 1 Visit OT Evaluation $OT Eval Low Complexity: 1 Low OT Treatments $Self Care/Home Management : 8-22 mins  6 CLICKS  Alianna Wurster 11/07/2018, 8:36 AM

## 2018-11-07 NOTE — Progress Notes (Signed)
Physical Therapy Treatment Patient Details Name: Tiffany Reese MRN: [id number] DOB: 07-11-1961 Today's Date: 11/07/2018    History of Present Illness 58 yo female admitted for ORIF forLeft fib fracture sustained  1 week ago. patient has been using WC. has fallen  again prior to surgery.    PT Comments    The patient  Requires assistance for trnafers for safety to BSC/WC. Patient will benefit from post acute rehab  To improve safety.   Follow Up Recommendations  SNF     Equipment Recommendations  None recommended by PT    Recommendations for Other Services       Precautions / Restrictions Precautions Precautions: Fall Restrictions LLE Weight Bearing: Non weight bearing    Mobility  Bed Mobility   Bed Mobility: Supine to Sit;Sit to Supine     Supine to sit: Supervision Sit to supine: Supervision   General bed mobility comments: self manages the left leg  Transfers Overall transfer level: Needs assistance   Transfers: Sit to/from Stand;Squat Pivot Transfers Sit to Stand: Mod assist   Squat pivot transfers: Mod assist     General transfer comment: BSC placed by bed. Mod assist to squat pivot to [facility] then to bed. BSC b=needed to be supported to prevent  tipping.  Ambulation/Gait                 Stairs             Wheelchair Mobility    Modified Rankin (Stroke Patients Only)       Balance                                            Cognition Arousal/Alertness: Awake/alert                                            Exercises      General Comments        Pertinent Vitals/Pain Pain Score: 4  Pain Location: left ankle Pain Descriptors / Indicators: Throbbing;Sore;Burning;Stabbing Pain Intervention(s): Limited activity within patient's tolerance;Monitored during session;Premedicated before session;Repositioned;Ice applied    Home Living                      Prior Function             PT Goals (current goals can now be found in the care plan section) Progress towards PT goals: Progressing toward goals    Frequency    Min 3X/week      PT Plan Current plan remains appropriate    Co-evaluation              AM-PAC PT "6 Clicks" Mobility   Outcome Measure  Help needed turning from your back to your side while in a flat bed without using bedrails?: A Little Help needed moving from lying on your back to sitting on the side of a flat bed without using bedrails?: A Little Help needed moving to and from a bed to a chair (including a wheelchair)?: Total Help needed standing up from a chair using your arms (e.g., wheelchair or bedside chair)?: Total Help needed to walk in hospital room?: Total Help needed climbing 3-5 steps with a railing? : Total 6 Click Score: 10  End of Session   Activity Tolerance: Patient tolerated treatment well Patient left: in bed;with call bell/phone within reach Nurse Communication: Mobility status PT Visit Diagnosis: Unsteadiness on feet (R26.81);Pain Pain - Right/Left: Left Pain - part of body: Ankle and joints of foot     Time: 1445-1530 PT Time Calculation (min) (ACUTE ONLY): 45 min  Charges:  $Therapeutic Activity: 23-37 mins $Self Care/Home Management: 23-37                     Blanchard Kelch PT Acute Rehabilitation Services Pager [phone] Office [phone]    Rada Hay 11/07/2018, 4:00 PM

## 2018-11-07 NOTE — Clinical Social Work Note (Signed)
Clinical Social Work Assessment  Patient Details  Name: Tiffany Reese MRN: [id number] Date of Birth: 03-06-1961  Date of referral:  11/07/18               Reason for consult:  Facility Placement                Permission sought to share information with:  Facility Medical sales representative, Family Supports Permission granted to share information::  Yes, Verbal Permission Granted  Name::     Vita Erm  Agency::     Relationship::  daughter  Contact Information:  [phone]  Housing/Transportation Living arrangements for the past 2 months:  Apartment Source of Information:  Patient Patient Interpreter Needed:  None Criminal Activity/Legal Involvement Pertinent to Current Situation/Hospitalization:  No - Comment as needed Significant Relationships:  Adult Children Lives with:  Self Do you feel safe going back to the place where you live?  Yes Need for family participation in patient care:  No (Coment)  Care giving concerns:  Social worker consulted to assist patient with SNF placement.   Social Worker assessment / plan:  Patient very friendly and engaged. Per patient, she recently moved to [place] by way of [address] and [place] in December to be closer to her daughter. Per patient, her daughter is her main support for care.  Employment status:  Disabled (Comment on whether or not currently receiving Disability) Insurance information:  Managed Medicare PT Recommendations:  Skilled Nursing Facility Information / Referral to community resources:  Skilled Nursing Facility  Patient/Family's Response to care:  Patient agrees to SNF placement and very appreciative of information provided regarding SNF authorization and placement process.  Patient/Family's Understanding of and Emotional Response to Diagnosis, Current Treatment, and Prognosis: Patient understands need and benefits of SNF placement.  Emotional Assessment Appearance:  Developmentally  appropriate Attitude/Demeanor/Rapport:  Gracious, Engaged Affect (typically observed):  Accepting, Hopeful Orientation:  Oriented to Self, Oriented to Place, Oriented to Situation, Oriented to  Time Alcohol / Substance use:  Not Applicable Psych involvement (Current and /or in the community):  No (Comment)  Discharge Needs  Concerns to be addressed:  No discharge needs identified Readmission within the last 30 days:  No Current discharge risk:  None Barriers to Discharge:  No Barriers Identified   Aris [place], LCSW 11/07/2018, 4:27 PM

## 2018-11-07 NOTE — NC FL2 (Signed)
[place] MEDICAID FL2 LEVEL OF CARE SCREENING TOOL     IDENTIFICATION  Patient Name: Tiffany Reese Birthdate: 14-Jun-1961 Sex: female Admission Date (Current Location): 11/05/2018  [facility] and [place] Number:  Producer, television/film/video and Address:  [facility],  [address] [place], [place] [address]      Provider Number: [id number]  Attending Physician Name and Address:  Sheral Apley, MD  Relative Name and Phone Number:  Sheria Lang: [phone]    Current Level of Care: Hospital Recommended Level of Care: Skilled Nursing Facility Prior Approval Number:    Date Approved/Denied:   PASRR Number: Pending  Discharge Plan: SNF    Current Diagnoses: Patient Active Problem List   Diagnosis Date Noted  . GERD (gastroesophageal reflux disease)   . Hypertension   . Diabetes mellitus type II, controlled (HCC)   . Depression, major   . PTSD (post-traumatic stress disorder) 11/01/2018  . Anxiety 11/01/2018  . Depression, recurrent (HCC) 11/01/2018  . Closed left ankle fracture 11/01/2018  . Prediabetes 11/01/2018  . Anemia 11/01/2018  . Hypothyroid 11/01/2018    Orientation RESPIRATION BLADDER Height & Weight     Self, Time, Situation, Place  Normal External catheter, Continent Weight: 281 lb 15.5 oz (127.9 kg) Height:  5\' 6"  (167.6 cm)  BEHAVIORAL SYMPTOMS/MOOD NEUROLOGICAL BOWEL NUTRITION STATUS      Continent Diet(Carb modified)  AMBULATORY STATUS COMMUNICATION OF NEEDS Skin   Limited Assist Verbally Surgical wounds(Ankle incision)                       Personal Care Assistance Level of Assistance  Bathing, Feeding, Dressing Bathing Assistance: Limited assistance Feeding assistance: Independent Dressing Assistance: Limited assistance     Functional Limitations Info  Sight, Hearing, Speech Sight Info: Adequate Hearing Info: Adequate Speech Info: Adequate    SPECIAL CARE FACTORS FREQUENCY  OT (By licensed OT), PT (By licensed PT)      PT Frequency: 5x/week OT Frequency: 5x/week            Contractures Contractures Info: Not present    Additional Factors Info  Code Status, Allergies, Psychotropic Code Status Info: Full Allergies Info: BUPROPION, DULOXETINE, SULFA ANTIBIOTICS, METFORMIN AND RELATED, PAXIL PAROXETINE HCL, PROZAC FLUOXETINE HCL, TRINTELLIX VORTIOXETINE, ZOLOFT SERTRALINE HCL  Psychotropic Info: PRN: Valium         Current Medications (11/07/2018):  This is the current hospital active medication list Current Facility-Administered Medications  Medication Dose Route Frequency Provider Last Rate Last Dose  . acetaminophen (TYLENOL) tablet 325-650 mg  325-650 mg Oral Q6H PRN Albina Billet III, PA-C   650 mg at 11/07/18 1032  . amitriptyline (ELAVIL) tablet 150 mg  150 mg Oral QHS PRN Albina Billet III, PA-C   150 mg at 11/06/18 2228  . diazepam (VALIUM) tablet 2 mg  2 mg Oral Q12H PRN Albina Billet III, PA-C   2 mg at 11/06/18 0041  . diphenhydrAMINE (BENADRYL) 12.5 MG/5ML elixir 12.5-25 mg  12.5-25 mg Oral Q4H PRN Albina Billet III, PA-C      . docusate sodium (COLACE) capsule 100 mg  100 mg Oral BID Albina Billet III, PA-C   100 mg at 11/07/18 1155  . enoxaparin (LOVENOX) injection 40 mg  40 mg Subcutaneous Q24H Albina Billet III, PA-C   40 mg at 11/07/18 2080  . furosemide (LASIX) tablet 20 mg  20 mg Oral Daily Albina Billet III, PA-C   20  mg at 11/07/18 0902  . ibuprofen (ADVIL,MOTRIN) tablet 800 mg  800 mg Oral TID Albina Billet III, PA-C   800 mg at 11/07/18 6144  . lactated ringers infusion   Intravenous Continuous Albina Billet III, PA-C 100 mL/hr at 11/05/18 1721    . levothyroxine (SYNTHROID, LEVOTHROID) tablet 50 mcg  50 mcg Oral QAC breakfast Albina Billet III, PA-C   50 mcg at 11/07/18 3154  . linagliptin (TRADJENTA) tablet 5 mg  5 mg Oral Daily Albina Billet III, PA-C   5 mg at  11/07/18 0086  . loratadine (CLARITIN) tablet 10 mg  10 mg Oral Daily Albina Billet III, PA-C   10 mg at 11/07/18 7619  . lurasidone (LATUDA) tablet 80 mg  80 mg Oral Q supper Sheral Apley, MD      . methocarbamol (ROBAXIN) tablet 500 mg  500 mg Oral Q6H PRN Albina Billet III, PA-C   500 mg at 11/07/18 1032   Or  . methocarbamol (ROBAXIN) 500 mg in dextrose 5 % 50 mL IVPB  500 mg Intravenous Q6H PRN Albina Billet III, PA-C      . metoCLOPramide (REGLAN) tablet 5-10 mg  5-10 mg Oral Q8H PRN Albina Billet III, PA-C       Or  . metoCLOPramide (REGLAN) injection 5-10 mg  5-10 mg Intravenous Q8H PRN Albina Billet III, PA-C      . metoprolol succinate (TOPROL-XL) 24 hr tablet 50 mg  50 mg Oral Daily Albina Billet III, PA-C   50 mg at 11/07/18 5093  . ondansetron (ZOFRAN) tablet 4 mg  4 mg Oral Q6H PRN Albina Billet III, PA-C       Or  . ondansetron [facility]) injection 4 mg  4 mg Intravenous Q6H PRN Albina Billet III, PA-C   4 mg at 11/07/18 0242  . oxyCODONE (Oxy IR/ROXICODONE) immediate release tablet 5-10 mg  5-10 mg Oral Q4H PRN Albina Billet III, PA-C   10 mg at 11/07/18 1308  . pantoprazole (PROTONIX) EC tablet 40 mg  40 mg Oral Daily Albina Billet III, PA-C   40 mg at 11/07/18 2671  . polyethylene glycol (MIRALAX / GLYCOLAX) packet 17 g  17 g Oral Daily PRN Albina Billet III, PA-C      . pregabalin (LYRICA) capsule 50 mg  50 mg Oral TID Albina Billet III, PA-C   50 mg at 11/07/18 0902     Discharge Medications: Please see discharge summary for a list of discharge medications.  Relevant Imaging Results:  Relevant Lab Results:   Additional Information SSN: [id number]  Enid Cutter, [place]

## 2018-11-08 ENCOUNTER — Other Ambulatory Visit: Payer: Self-pay | Admitting: Family Medicine

## 2018-11-08 ENCOUNTER — Encounter (HOSPITAL_BASED_OUTPATIENT_CLINIC_OR_DEPARTMENT_OTHER): Payer: Self-pay | Admitting: Orthopedic Surgery

## 2018-11-08 DIAGNOSIS — S82852A Displaced trimalleolar fracture of left lower leg, initial encounter for closed fracture: Secondary | ICD-10-CM | POA: Diagnosis not present

## 2018-11-08 DIAGNOSIS — F4310 Post-traumatic stress disorder, unspecified: Secondary | ICD-10-CM

## 2018-11-08 DIAGNOSIS — S82892D Other fracture of left lower leg, subsequent encounter for closed fracture with routine healing: Secondary | ICD-10-CM

## 2018-11-08 LAB — GLUCOSE, CAPILLARY
Glucose-Capillary: 116 mg/dL — ABNORMAL HIGH (ref 70–99)
Glucose-Capillary: 113 mg/dL — ABNORMAL HIGH (ref 70–99)
Glucose-Capillary: 117 mg/dL — ABNORMAL HIGH (ref 70–99)
Glucose-Capillary: 141 mg/dL — ABNORMAL HIGH (ref 70–99)

## 2018-11-08 NOTE — Care Management Obs Status (Signed)
MEDICARE OBSERVATION STATUS NOTIFICATION   Patient Details  Name: Tiffany Reese MRN: [id number] Date of Birth: Jun 19, 1961   Medicare Observation Status Notification Given:       Golda Acre, RN 11/08/2018, 10:04 AM

## 2018-11-08 NOTE — Plan of Care (Signed)

## 2018-11-08 NOTE — Progress Notes (Signed)
Referral placed again by Lillia Abed and co-signed

## 2018-11-08 NOTE — Progress Notes (Signed)
Physical Therapy Treatment Patient Details Name: Tiffany Reese MRN: [id number] DOB: 1960-09-28 Today's Date: 11/08/2018    History of Present Illness 58 yo female admitted for ORIF forLeft fib fracture sustained  1 week ago. patient has been using WC. has fallen  again prior to surgery.    PT Comments    Assisted OOB to practice transfers.  General transfer comment: 1/4 pivot towards R while NWB L LE no walker was easier with 50% VC's on proper hand transfer.  Assisted from elevated bed to wc then from wc to [facility] then from [facility] back to wc.  Each time pt performed a 1/4 pivot.  Then performed wheel chair mobility.  Wheelchair Mobility Wheelchair mobility: Yes Wheelchair propulsion: Both upper extremities;Right lower extremity Wheelchair parts: Supervision/cueing Distance: 55 feet Wheelchair Assistance Details (indicate cue type and reason): assist with narrow pathways and leg rests  Follow Up Recommendations  SNF     Equipment Recommendations  None recommended by PT    Recommendations for Other Services       Precautions / Restrictions Precautions Precautions: Fall Restrictions Weight Bearing Restrictions: Yes LLE Weight Bearing: Non weight bearing    Mobility  Bed Mobility Overal bed mobility: Needs Assistance Bed Mobility: Supine to Sit     Supine to sit: Min assist     General bed mobility comments: self manages the left leg with increased time   Transfers Overall transfer level: Needs assistance Equipment used: None Transfers: Stand Pivot Transfers   Stand pivot transfers: Min assist;Mod assist       General transfer comment: 1/4 pivot towards R while NWB L LE no walker was easier with 50% VC's on proper hand transfer.  Assisted from elevated bed to wc then from wc to [facility] then from [facility] back to wc.  Each time pt performed a 1/4 pivot.    Ambulation/Gait             General Gait Details: transfers only during this session   Investment banker, corporate mobility: Yes Wheelchair propulsion: Both upper extremities;Right lower extremity Wheelchair parts: Supervision/cueing Distance: 55 feet Wheelchair Assistance Details (indicate cue type and reason): assist with narrow pathways and leg rests  Modified Rankin (Stroke Patients Only)       Balance                                            Cognition Arousal/Alertness: Awake/alert Behavior During Therapy: WFL for tasks assessed/performed Overall Cognitive Status: Within Functional Limits for tasks assessed                                        Exercises      General Comments        Pertinent Vitals/Pain Pain Assessment: 0-10 Pain Score: 5  Pain Location: left ankle Pain Descriptors / Indicators: Throbbing;Sore;Burning;Stabbing Pain Intervention(s): Repositioned;Monitored during session    Home Living                      Prior Function            PT Goals (current goals can now be found in the care plan section) Progress towards PT goals:  Progressing toward goals    Frequency    Min 3X/week      PT Plan Current plan remains appropriate    Co-evaluation              AM-PAC PT "6 Clicks" Mobility   Outcome Measure  Help needed turning from your back to your side while in a flat bed without using bedrails?: A Little Help needed moving from lying on your back to sitting on the side of a flat bed without using bedrails?: A Little Help needed moving to and from a bed to a chair (including a wheelchair)?: Total Help needed standing up from a chair using your arms (e.g., wheelchair or bedside chair)?: Total Help needed to walk in hospital room?: Total Help needed climbing 3-5 steps with a railing? : Total 6 Click Score: 10    End of Session Equipment Utilized During Treatment: Gait belt Activity Tolerance: Patient tolerated treatment well Patient left:  in chair;with call bell/phone within reach Nurse Communication: Mobility status PT Visit Diagnosis: Unsteadiness on feet (R26.81);Pain Pain - Right/Left: Left Pain - part of body: Ankle and joints of foot     Time: 1240-1306 PT Time Calculation (min) (ACUTE ONLY): 26 min  Charges:  $Therapeutic Activity: 8-22 mins $Wheel Chair Management: 8-22 mins                     Felecia Shelling  PTA Acute  Rehabilitation Services Pager      [phone] Office      [phone]

## 2018-11-08 NOTE — Progress Notes (Signed)
Patient and Daughter chose SNF options [facility] PASRR information submitted / still under review. Level II.  CSW will continue to assist with discharge to SNF  Tiffany Reese, [place], MSW Clinical Social Worker  [phone] 11/08/2018  4:57 PM

## 2018-11-08 NOTE — Progress Notes (Signed)
Sorry this came through blank? Not sure what message you were routing to me?

## 2018-11-09 DIAGNOSIS — S82852A Displaced trimalleolar fracture of left lower leg, initial encounter for closed fracture: Secondary | ICD-10-CM | POA: Diagnosis not present

## 2018-11-09 LAB — GLUCOSE, CAPILLARY
Glucose-Capillary: 98 mg/dL (ref 70–99)
Glucose-Capillary: 129 mg/dL — ABNORMAL HIGH (ref 70–99)

## 2018-11-09 MED ORDER — METHOCARBAMOL 500 MG PO TABS: 500.0000 mg | ORAL_TABLET | Freq: Three times a day (TID) | ORAL | 0 refills | Status: DC | PRN

## 2018-11-09 MED ORDER — OXYCODONE HCL 5 MG PO TABS: 5.0000 mg | ORAL_TABLET | ORAL | 0 refills | Status: DC | PRN

## 2018-11-09 MED ORDER — DOCUSATE SODIUM 100 MG PO CAPS: 100.0000 mg | ORAL_CAPSULE | Freq: Two times a day (BID) | ORAL | 0 refills | Status: DC

## 2018-11-09 MED ORDER — ONDANSETRON HCL 4 MG PO TABS: 4.0000 mg | ORAL_TABLET | Freq: Three times a day (TID) | ORAL | 0 refills | Status: DC | PRN

## 2018-11-09 MED ORDER — ASPIRIN EC 81 MG PO TBEC: 81.0000 mg | DELAYED_RELEASE_TABLET | Freq: Two times a day (BID) | ORAL | 0 refills | Status: DC

## 2018-11-09 MED ORDER — ACETAMINOPHEN 500 MG PO TABS: 1000.0000 mg | ORAL_TABLET | Freq: Three times a day (TID) | ORAL | 0 refills | Status: DC

## 2018-11-09 NOTE — Clinical Social Work Placement (Addendum)
D/C summary sent  PTAR arranged for transport. ETA: Unknown Nurse call report to: [phone]  CLINICAL SOCIAL WORK PLACEMENT  NOTE  Date:  11/09/2018  Patient Details  Name: Tiffany Reese MRN: [id number] Date of Birth: Jan 26, 1961  Clinical Social Work is seeking post-discharge placement for this patient at the Skilled  Nursing Facility level of care (*CSW will initial, date and re-position this form in  chart as items are completed):  Yes   Patient/family provided with [facility] Clinical Social Work Department's list of facilities offering this level of care within the geographic area requested by the patient (or if unable, by the patient's family).  Yes   Patient/family informed of their freedom to choose among providers that offer the needed level of care, that participate in Medicare, Medicaid or managed care program needed by the patient, have an available bed and are willing to accept the patient.  Yes   Patient/family informed of [facility]'s ownership interest in [facility] and [facility], as well as of the fact that they are under no obligation to receive care at these facilities.  PASRR submitted to EDS on 11/08/18     PASRR number received on 11/09/18     Existing PASRR number confirmed on       FL2 transmitted to all facilities in geographic area requested by pt/family on       FL2 transmitted to all facilities within larger geographic area on 11/09/18     Patient informed that his/her managed care company has contracts with or will negotiate with certain facilities, including the following:  [facility]     Yes   Patient/family informed of bed offers received.  Patient chooses bed at [facility]     Physician recommends and patient chooses bed at      Patient to be transferred to [facility] on 11/09/18.  Patient to be transferred to facility by PTAR     Patient family notified on 11/09/18 of  transfer.  Name of family member notified:  Daughter Tiffany Reese    PHYSICIAN Please prepare priority discharge summary, including medications     Additional Comment:    _______________________________________________ Clearance Coots, LCSW 11/09/2018, 10:33 AM

## 2018-11-09 NOTE — Progress Notes (Signed)
Patient up/pivots to bedside commode. Vitals stable.

## 2018-11-09 NOTE — Progress Notes (Signed)
Patient was discharged to [facility] in stable condition. All discharge instructions were given to the patient and patient verbalized understanding.

## 2018-11-09 NOTE — Discharge Summary (Signed)
Discharge Summary  Patient ID: Tiffany Reese MRN: [id number] DOB/AGE: 1961-08-15 58 y.o.  Admit date: 11/05/2018 Discharge date: 11/09/2018  Admission Diagnoses:  Closed left ankle fracture  Discharge Diagnoses:  Principal Problem:   Closed left ankle fracture   Past Medical History:  Diagnosis Date  . Colitis 2019  . Depression, major   . Diabetes mellitus type II, controlled (HCC)   . Frequent headaches   . GERD (gastroesophageal reflux disease)   . Hepatic steatosis   . Hypertension   . Hypothyroid   . PTSD (post-traumatic stress disorder)     Surgeries: Procedure(s): OPEN REDUCTION INTERNAL FIXATION (ORIF) LEFT TRIMALLEOLAR ANKLE FRACTURE WITH OR WITHOUT FIXATION OF POSTERIOR LIP, XR 3V LEFT ANKLE on 11/05/2018   Consultants (if any):   Discharged Condition: Improved  Hospital Course: Tiffany Reese is an 58 y.o. female who was admitted 11/05/2018 with a diagnosis of Closed left ankle fracture and went to the operating room on 11/05/2018 and underwent the above named procedures.    She was given perioperative antibiotics:  Anti-infectives (From admission, onward)   Start     Dose/Rate Route Frequency Ordered Stop   11/05/18 1630  ceFAZolin (ANCEF) IVPB 2g/100 mL premix     2 g 200 mL/hr over 30 Minutes Intravenous Every 6 hours 11/05/18 1617 11/06/18 0535   11/05/18 1000  ceFAZolin (ANCEF) IVPB 2g/100 mL premix     2 g 200 mL/hr over 30 Minutes Intravenous On call to O.R. 11/05/18 0957 11/05/18 1224    .  She was given sequential compression devices, early ambulation, and Lovenox for DVT prophylaxis.  The patient failed to mobilize well enough to safely discharge to home.  SNF was recommended.  She remained inpatient until placement was secured.  She benefited maximally from the hospital stay and there were no complications.    Recent vital signs:  Vitals:   11/08/18 2149 11/09/18 0345  BP: 106/65 119/64  Pulse: 80 87  Resp: 16   Temp: 98.1 F  (36.7 C) 98 F (36.7 C)  SpO2: 95% 93%    Recent laboratory studies:  Lab Results  Component Value Date   HGB 10.7 (L) 11/05/2018   HGB 11.1 (L) 10/27/2018   Lab Results  Component Value Date   WBC 10.6 (H) 11/05/2018   PLT 295 11/05/2018   No results found for: INR Lab Results  Component Value Date   NA 141 10/27/2018   K 4.4 10/27/2018   CL 105 10/27/2018   CO2 25 10/27/2018   BUN 9 10/27/2018   CREATININE 0.98 11/05/2018   GLUCOSE 132 (H) 10/27/2018    Discharge Medications:   Allergies as of 11/09/2018      Reactions   Bupropion Other (See Comments)   Causes agitation   Duloxetine Other (See Comments)   Causes involuntary movements   Sulfa Antibiotics    Metformin And Related    vomiting   Paxil [paroxetine Hcl]    Insomnia; manic   Prozac [fluoxetine Hcl]    Hyperactive, insomnia   Trintellix [vortioxetine]    Triggers fibromyalgia   Zoloft [sertraline Hcl]    Hyperactivity, insomnia      Medication List    TAKE these medications   acetaminophen 500 MG tablet Commonly known as:  TYLENOL Take 2 tablets (1,000 mg total) by mouth every 8 (eight) hours for 10 days. For Pain. What changed:    when to take this  reasons to take this  additional instructions  amitriptyline 150 MG tablet Commonly known as:  ELAVIL Take 150 mg by mouth at bedtime.   aspirin EC 81 MG tablet Take 1 tablet (81 mg total) by mouth 2 (two) times daily. For DVT prophylaxis for 30 days after surgery.   atorvastatin 20 MG tablet Commonly known as:  LIPITOR Take 20 mg by mouth every evening.   cetirizine 10 MG tablet Commonly known as:  ZYRTEC Take 10 mg by mouth daily.   diazepam 2 MG tablet Commonly known as:  VALIUM Take 1 tablet (2 mg total) by mouth daily as needed for anxiety (severe anxiety).   docusate sodium 100 MG capsule Commonly known as:  COLACE Take 1 capsule (100 mg total) by mouth 2 (two) times daily. To prevent constipation while taking pain  medication.   furosemide 20 MG tablet Commonly known as:  LASIX Take 20 mg by mouth daily.   ibuprofen 800 MG tablet Commonly known as:  ADVIL,MOTRIN Take 800 mg by mouth every 8 (eight) hours as needed for moderate pain. What changed:  Another medication with the same name was removed. Continue taking this medication, and follow the directions you see here.   levothyroxine 50 MCG tablet Commonly known as:  SYNTHROID, LEVOTHROID Take 50 mcg by mouth daily before breakfast.   lurasidone 80 MG Tabs tablet Commonly known as:  LATUDA Take 80 mg by mouth daily with breakfast.   methocarbamol 500 MG tablet Commonly known as:  ROBAXIN Take 500 mg by mouth 2 (two) times daily. What changed:  Another medication with the same name was changed. Make sure you understand how and when to take each.   methocarbamol 500 MG tablet Commonly known as:  ROBAXIN Take 1 tablet (500 mg total) by mouth every 8 (eight) hours as needed for muscle spasms. What changed:    when to take this  reasons to take this   metoprolol succinate 50 MG 24 hr tablet Commonly known as:  TOPROL-XL Take 50 mg by mouth daily. Take with or immediately following a meal.   MULTIVITAMIN ADULT PO Take 1 tablet by mouth daily.   mupirocin cream 2 % Commonly known as:  BACTROBAN Apply 1 application topically daily as needed (skin irritation).   omeprazole 40 MG capsule Commonly known as:  PRILOSEC Take 40 mg by mouth daily.   ondansetron 4 MG tablet Commonly known as:  ZOFRAN Take 1 tablet (4 mg total) by mouth every 8 (eight) hours as needed for nausea or vomiting.   oxyCODONE 5 MG immediate release tablet Commonly known as:  ROXICODONE Take 1 tablet (5 mg total) by mouth every 4 (four) hours as needed for up to 30 days for breakthrough pain.   pregabalin 50 MG capsule Commonly known as:  LYRICA Take 50 mg by mouth 3 (three) times daily as needed (pain).   sitaGLIPtin 50 MG tablet Commonly known as:   JANUVIA Take 50 mg by mouth daily.   vitamin C 500 MG tablet Commonly known as:  ASCORBIC ACID Take 500 mg by mouth daily.       Diagnostic Studies: Dg Chest 2 View  Result Date: 10/27/2018 CLINICAL DATA:  Near syncope. EXAM: CHEST - 2 VIEW COMPARISON:  None. FINDINGS: The heart size and mediastinal contours are within normal limits. Both lungs are clear. The visualized skeletal structures are unremarkable. IMPRESSION: No active cardiopulmonary disease. Electronically Signed   By: Elsie Stain M.D.   On: 10/27/2018 20:25   Dg Ankle 2 Views Left  Result  Date: 10/27/2018 CLINICAL DATA:  Larey SeatFell at home. EXAM: LEFT ANKLE - 2 VIEW COMPARISON:  None. FINDINGS: Fracture dislocation noted at the ankle. The talus is dislocated laterally in relation to the tibia. Markedly displaced distal fibular fracture and posterior malleolar fracture of the tibia. Transverse fracture through the base of the medial malleolus. The talus is intact. The subtalar joints are maintained. IMPRESSION: Complex comminuted fracture dislocation at the ankle. Electronically Signed   By: Rudie MeyerP.  Gallerani M.D.   On: 10/27/2018 19:37   Dg Ankle Complete Left  Result Date: 10/29/2018 CLINICAL DATA:  58 year old female who fell 2 days ago breaking ankle, repeat fall today. EXAM: LEFT ANKLE COMPLETE - 3+ VIEW COMPARISON:  Post reduction images 2520 and earlier. FINDINGS: Cast or splint material remains in place about the ankle with underlying trimalleolar fracture not significantly changed in alignment from the post reduction images 2 days ago. Talus and calcaneus appear remain intact. IMPRESSION: Trimalleolar fracture with stable configuration since the post reduction images 2 days ago. Electronically Signed   By: Odessa FlemingH  Hall M.D.   On: 10/29/2018 19:24   Dg Ankle Left Port  Result Date: 10/27/2018 CLINICAL DATA:  Postreduction. EXAM: PORTABLE LEFT ANKLE - 2 VIEW COMPARISON:  LEFT ankle radiograph October 27, 2018 at 1915 hours FINDINGS: Acute  trimalleolar fracture in alignment. Improved alignment of ankle mortise, mild widening of the anterior tibial talar space. Thick fiberglass splint obscures the fine bony detail. IMPRESSION: 1. Acute trimalleolar fracture in alignment. 2. Improved alignment of ankle mortise. Electronically Signed   By: Awilda Metroourtnay  Bloomer M.D.   On: 10/27/2018 21:45   Dg Foot Complete Left  Result Date: 10/29/2018 CLINICAL DATA:  58 year old female who fell 2 days ago breaking ankle, repeat fall today. EXAM: LEFT FOOT - COMPLETE 3+ VIEW COMPARISON:  Ankle series today and earlier. FINDINGS: Cast material projects about the foot and ankle and decreases bone detail on some images. There is new or increased distal soft tissue swelling and stranding at the MTP joint level. No soft tissue gas. No definite metatarsal or phalanx fracture. No definite tarsal fracture. IMPRESSION: 1. Cast material which obscures bone detail on some images. 2. New or increased soft tissue swelling and stranding at the dorsal MTP level. No associated foot fracture identified. 3. Left ankle reported separately. Electronically Signed   By: Odessa FlemingH  Hall M.D.   On: 10/29/2018 19:26    Disposition: Discharge disposition: 03-Skilled Nursing Facility       Discharge Instructions    Discharge patient   Complete by:  As directed    Discharge disposition:  03-Skilled Nursing Facility   Discharge patient date:  11/09/2018      Follow-up Information    Sheral ApleyMurphy, Timothy D, MD. Schedule an appointment as soon as possible for a visit in 10 days.   Specialty:  Orthopedic Surgery Contact information: [address] [place] [id number] [phone]            Signed: Albina BilletHenry Calvin Martensen III PA-C 11/09/2018, 1:29 PM

## 2018-11-10 ENCOUNTER — Non-Acute Institutional Stay (SKILLED_NURSING_FACILITY): Payer: Medicare HMO | Admitting: Internal Medicine

## 2018-11-10 ENCOUNTER — Encounter: Payer: Self-pay | Admitting: Internal Medicine

## 2018-11-10 DIAGNOSIS — K219 Gastro-esophageal reflux disease without esophagitis: Secondary | ICD-10-CM | POA: Diagnosis not present

## 2018-11-10 DIAGNOSIS — D509 Iron deficiency anemia, unspecified: Secondary | ICD-10-CM

## 2018-11-10 DIAGNOSIS — Z9189 Other specified personal risk factors, not elsewhere classified: Secondary | ICD-10-CM | POA: Diagnosis not present

## 2018-11-10 DIAGNOSIS — S82892D Other fracture of left lower leg, subsequent encounter for closed fracture with routine healing: Secondary | ICD-10-CM

## 2018-11-10 DIAGNOSIS — F339 Major depressive disorder, recurrent, unspecified: Secondary | ICD-10-CM

## 2018-11-10 NOTE — Assessment & Plan Note (Addendum)
Risk discussed with her.  Risk would also be impacted by the presence of fatty liver.  The pathophysiology discussed with her

## 2018-11-10 NOTE — Progress Notes (Signed)
NURSING HOME LOCATION:  [facility] ROOM NUMBER:  [id number]  CODE STATUS:  Full Code  PCP:  Tiffany Banker, MD  [address] [place] [address]  This is a comprehensive admission note to [facility] performed on this date less than 30 days from date of admission.  Included are preadmission medical/surgical history; reconciled medication list; family history; social history and comprehensive review of systems.   Corrections and additions to the records were documented. Comprehensive physical exam was also performed. Additionally a clinical summary was entered for each active diagnosis pertinent to this admission in the Problem List to enhance continuity of care.  HPI: Patient was hospitalized 2/14-2/18/2020 with a closed left ankle fracture sustained in a fall.She states that she has had muscle weakness particularly in the right upper extremity for 3-4 weeks.  She states that she will reach for something and the right upper extremity will "fall down".  Also when she grips something she is unable to hold it tightly.  She has had similar symptoms in the left upper extremity to a lesser extent.  At the time of the fall she stated that she just had "total body collapse" dropping to the floor.  She also may have had some vague numbness and tingling "all over".  She denies any specific cardiac or neurologic prodrome prior to this fall.   Of interest is PMH of a fracture of the right ankle in 2017.  She states that she had been sleeping in her lounge chair and awoke in the hospital with the fracture.  She is unsure how it was sustained. She underwent open reduction and internal fixation of the left trimalleolar fracture 2/14 by Dr Tiffany Reese.  She received prophylactic antibiotics with Ancef, sequential compression devices, and Lovenox for DVT prophylaxis.  She was discharged to the SNF for PT/OT.  Past medical and surgical history: Includes major depression, type 2 diabetes,  recurrent headaches, GERD, hepatic steatosis, essential hypertension, hypothyroidism, history of colitis and PTSD. Surgeries include renal stenting ,abdominal hysterectomy, and previous right ankle surgery.  Social history: Nondrinker, non-smoker.  She used marijuana minimally in college but was not habitual.  She has been vaping since 2015.  Family history: Reviewed   Review of systems: She states that she has had double vision "all my life" associated with blurred vision.  She also has intermittent dysphagia almost every other day which she relates to being edentulous.  She has variable constipation and loose stools.  She has no bleeding dyscrasias except for easy bruising.  Constitutional: No fever, significant weight change, fatigue  Eyes: No redness, discharge, pain ENT/mouth: No nasal congestion, purulent discharge, earache, change in hearing, sore throat  Cardiovascular: No chest pain, palpitations, paroxysmal nocturnal dyspnea, claudication, edema  Respiratory: No cough, sputum production, hemoptysis, DOE, significant snoring, apnea Gastrointestinal: No heartburn,  abdominal pain, nausea /vomiting, rectal bleeding, melena Genitourinary: No dysuria, hematuria, pyuria, incontinence, nocturia Dermatologic: No rash, pruritus, change in appearance of skin Neurologic: No dizziness,  syncope, seizures Endocrine: No change in hair/skin/nails, excessive thirst, excessive hunger, excessive urination  Hematologic/lymphatic: No significant  lymphadenopathy, abnormal bleeding Allergy/immunology: No itchy/watery eyes, significant sneezing, urticaria, angioedema  Physical exam:  Pertinent or positive findings: She is edentulous.  Abdomen is protuberant.  Left lower extremity is wrapped.  Posterior tibial pulses are decreased on the right.  She is generally weak to opposition, possibly slightly more so on the left upper extremity.  General appearance: Adequately nourished; no acute distress,  increased  work of breathing is present.   Lymphatic: No lymphadenopathy about the head, neck, axilla. Eyes: No conjunctival inflammation or lid edema is present. There is no scleral icterus. Ears:  External ear exam shows no significant lesions or deformities.   Nose:  External nasal examination shows no deformity or inflammation. Nasal mucosa are pink and moist without lesions, exudates Oral exam: Lips and gums are healthy appearing.There is no oropharyngeal erythema or exudate. Neck:  No thyromegaly, masses, tenderness noted.    Heart:  Normal rate and regular rhythm. S1 and S2 normal without gallop, murmur, click, rub.  Lungs: Chest clear to auscultation without wheezes, rhonchi, rales, rubs. Abdomen: Bowel sounds are normal.  Abdomen is soft and nontender with no organomegaly, hernias, masses. GU: Deferred  Extremities:  No cyanosis, clubbing, edema. Neurologic exam:  Strength equal  in upper & lower extremities. Balance, Rhomberg, finger to nose testing could not be completed due to clinical state Deep tendon reflexes are equal Skin: Warm & dry w/o tenting. No significant lesions or rash.  See clinical summary under each active problem in the Problem List with associated updated therapeutic plan

## 2018-11-10 NOTE — Assessment & Plan Note (Addendum)
Risk of high-dose ibuprofen on a regular basis discussed with the patient.  Other triggers for reflux discussed. She is reluctant to limit high dose NSAIDs despite risk.("I've been on it for years") Continue Prilosec.

## 2018-11-10 NOTE — Assessment & Plan Note (Addendum)
Ibuprofen 800 mg every 8 hours as needed is active order.  Potential GI risk discussed with the patient.  She has had a hysterectomy so the anemia is most likely related to chronic GI loss.  She also has a history of GERD and colitis.

## 2018-11-10 NOTE — Patient Instructions (Signed)
See assessment and plan under each diagnosis in the problem list and acutely for this visit 

## 2018-11-10 NOTE — Assessment & Plan Note (Addendum)
Amitriptyline is among those which experts have documented to have a very  high risk of affecting  mental  alertness  & balance.  The diagnosis of hepato-steatosis may increase this risk.  Additionally potential impact on QT interval should be considered.

## 2018-11-10 NOTE — Assessment & Plan Note (Addendum)
PT/OT at SNF Ortho follow-up as scheduled Because of history of diffuse muscle weakness preceding fall; Robaxin will be discontinued.  If the muscle weakness symptoms recur or persist off Robaxin, Neurology evaluation indicated although  Myasthenia gravis is doubtful.

## 2018-11-15 ENCOUNTER — Encounter: Payer: Self-pay | Admitting: Internal Medicine

## 2018-11-15 ENCOUNTER — Non-Acute Institutional Stay (SKILLED_NURSING_FACILITY): Payer: Medicare HMO | Admitting: Internal Medicine

## 2018-11-15 DIAGNOSIS — R600 Localized edema: Secondary | ICD-10-CM

## 2018-11-15 DIAGNOSIS — R7989 Other specified abnormal findings of blood chemistry: Secondary | ICD-10-CM

## 2018-11-15 DIAGNOSIS — S82892D Other fracture of left lower leg, subsequent encounter for closed fracture with routine healing: Secondary | ICD-10-CM

## 2018-11-15 NOTE — Progress Notes (Signed)
Location:    [facility] Room Number: 303/A Place of Service:  SNF (31) Provider:  Darnelle Catalan, MD  Patient Care Team: Wynn Banker, MD as PCP - General (Family Medicine)  Extended Emergency Contact Information Primary Emergency Contact: Matilde Sprang Address: [address]          [place], [place] [address] D[place] of [place] Mobile Phone: [phone] Relation: Daughter  Code Status:  Full Code Goals of care: Advanced Directive information Advanced Directives 11/05/2018  Does Patient Have a Medical Advance Directive? No  Would patient like information on creating a medical advance directive? No - Patient declined     Chief complaint-acute visit secondary to elevated d-dimer- also complains of hand edema  HPI:  Pt is a 58 y.o. female seen today for an acute visit for an elevated d-dimer as well as some hand swelling.  Patient is here for rehab after sustaining a closed left ankle fracture sustained in a fall.  She underwent an open reduction internal fixation of a left trimalleolar fracture.  She had been on Lovenox for DVT prophylaxis but it now is on aspirin.  She is receiving Vicodin as needed for pain as well as baclofen.  Apparently a couple days ago she complained of increased pain and Dr. Alwyn Ren did order a d-dimer to rule out any possibility of a DVT.  That lab was obtained this morning and has come back significantly elevated at 2770.  She is not complaining of acute pain at this time says it still does hurt however.  Before the lab arrived I did see her actually for some hand swelling-she stated she does sleep with her hands down at her side and this could very well be dependent edema- she says her hands feel somewhat sore but not acutely so. She does have some history of lower extremity edema which she says is unchanged-- she is on Lasix 20 mg a day  Currently she is resting  in bed comfortably she was also resting comfortably this morning and then she she went out to a an appointment with her psychiatrist with her history of depression.  She was reevaluated when she returned the facility after the lab arrived- she appeared to be unchanged clinically did not complain of any shortness of breath or chest pain says she was having continued soreness of the left foot  ankle area     Past Medical History:  Diagnosis Date  . Colitis 2019  . Depression, major   . Diabetes mellitus type II, controlled (HCC)   . Frequent headaches   . GERD (gastroesophageal reflux disease)   . Hepatic steatosis   . Hypertension   . Hypothyroid   . PTSD (post-traumatic stress disorder)    Past Surgical History:  Procedure Laterality Date  . ABDOMINAL HYSTERECTOMY  2015   ovaries remain; done for abnormal pap, endometriosis  . ANKLE SURGERY Right    fracture repair  . KIDNEY SURGERY     stenting  . ORIF ANKLE FRACTURE Left 11/05/2018   Procedure: OPEN REDUCTION INTERNAL FIXATION (ORIF) LEFT TRIMALLEOLAR ANKLE FRACTURE WITH OR WITHOUT FIXATION OF POSTERIOR LIP, XR 3V LEFT ANKLE;  Surgeon: Sheral Apley, MD;  Location: [facility];  Service: Orthopedics;  Laterality: Left;    Allergies  Allergen Reactions  . Bupropion Other (See Comments)    Causes agitation  . Duloxetine Other (See Comments)    Causes involuntary  movements  . Sulfa Antibiotics   . Metformin And Related     vomiting  . Paxil [Paroxetine Hcl]     Insomnia; manic  . Prozac [Fluoxetine Hcl]     Hyperactive, insomnia  . Trintellix [Vortioxetine]     Triggers fibromyalgia  . Zoloft [Sertraline Hcl]     Hyperactivity, insomnia    Outpatient Encounter Medications as of 11/15/2018  Medication Sig  . acetaminophen (TYLENOL) 500 MG tablet Take 2 tablets (1,000 mg total) by mouth every 8 (eight) hours for 10 days. For Pain.  Marland Kitchen amitriptyline (ELAVIL) 150 MG tablet Take 150 mg by mouth at  bedtime.   Marland Kitchen aspirin EC 81 MG tablet Take 1 tablet (81 mg total) by mouth 2 (two) times daily. For DVT prophylaxis for 30 days after surgery.  Marland Kitchen atorvastatin (LIPITOR) 20 MG tablet Take 20 mg by mouth every evening.   . cetirizine (ZYRTEC) 10 MG tablet Take 10 mg by mouth daily.   . diazepam (VALIUM) 2 MG tablet Take 1 tablet (2 mg total) by mouth daily as needed for anxiety (severe anxiety).  Marland Kitchen docusate sodium (COLACE) 100 MG capsule Take 1 capsule (100 mg total) by mouth 2 (two) times daily. To prevent constipation while taking pain medication.  . furosemide (LASIX) 20 MG tablet Take 20 mg by mouth daily.  Marland Kitchen ibuprofen (ADVIL,MOTRIN) 800 MG tablet Take 800 mg by mouth every 8 (eight) hours as needed for moderate pain.  Marland Kitchen levothyroxine (SYNTHROID, LEVOTHROID) 50 MCG tablet Take 50 mcg by mouth daily before breakfast.  . loratadine (CLARITIN) 10 MG tablet Take 10 mg by mouth daily as needed for allergies.  Marland Kitchen lurasidone (LATUDA) 80 MG TABS tablet Take 80 mg by mouth daily with breakfast.  . methocarbamol (ROBAXIN) 500 MG tablet Take 1 tablet (500 mg total) by mouth every 8 (eight) hours as needed for muscle spasms.  . metoprolol succinate (TOPROL-XL) 50 MG 24 hr tablet Take 50 mg by mouth daily. Take with or immediately following a meal.  . Multiple Vitamins-Minerals (MULTIVITAMIN ADULT PO) Take 1 tablet by mouth daily.  Marland Kitchen omeprazole (PRILOSEC) 40 MG capsule Take 40 mg by mouth daily.  . ondansetron (ZOFRAN) 4 MG tablet Take 1 tablet (4 mg total) by mouth every 8 (eight) hours as needed for nausea or vomiting.  Marland Kitchen oxyCODONE (ROXICODONE) 5 MG immediate release tablet Take 1 tablet (5 mg total) by mouth every 4 (four) hours as needed for up to 30 days for breakthrough pain.  . pregabalin (LYRICA) 50 MG capsule Take 50 mg by mouth every 8 (eight) hours as needed (pain).   Marland Kitchen sitaGLIPtin (JANUVIA) 50 MG tablet Take 50 mg by mouth daily.  . vitamin C (ASCORBIC ACID) 500 MG tablet Take 500 mg by mouth  daily.  . [DISCONTINUED] methocarbamol (ROBAXIN) 500 MG tablet Take 500 mg by mouth 2 (two) times daily.  . [DISCONTINUED] mupirocin cream (BACTROBAN) 2 % Apply 1 application topically daily as needed (skin irritation).    No facility-administered encounter medications on file as of 11/15/2018.     Review of Systems   In general she is not complaining of any fever or chills.  Skin does not complain of rashes or itching.  Head ears eyes nose mouth and throat says she has a history of chronic double vision but does not complain of any acute changes.  Is not complaining of a sore throat.  Respiratory is not complaining of shortness of breath or cough.  Cardiac does not  complain of chest pain lower extremity edema appears to be baseline per patient this appears to be fairly mild.  She is also had some edema of her hands bilaterally.  GI is not complaining of abdominal pain nausea vomiting diarrhea or constipation at times will complain of intermittent constipation and at times loose stools.  GU does not complain of dysuria.  Musculoskeletal does have some pain of her left ankle area apparently the Vicodin is helping.  Neurologic does not complain of dizziness headache or syncope.  And psych does have an history of depression she is followed by psychiatry at this point appears to be stable.    Immunization History  Administered Date(s) Administered  . Influenza,inj,Quad PF,6+ Mos 06/22/2018   Pertinent  Health Maintenance Due  Topic Date Due  . FOOT EXAM  12/14/2018 (Originally 06/12/1973)  . MAMMOGRAM  12/14/2018 (Originally 06/12/2013)  . PAP SMEAR-Modifier  12/14/2018 (Originally 06/12/1984)  . OPHTHALMOLOGY EXAM  12/14/2018 (Originally 06/12/1973)  . URINE MICROALBUMIN  12/14/2018 (Originally 06/12/1973)  . COLONOSCOPY  12/14/2018 (Originally 06/12/2013)  . HEMOGLOBIN A1C  05/02/2019  . INFLUENZA VACCINE  Completed   No flowsheet data found. Functional Status Survey:     Vitals:   11/15/18 1644  BP: 116/72  Pulse: 86  Resp: 17  Temp: (!) 96.9 F (36.1 C)  TempSrc: Other (Comment)  SpO2: 94%    Physical Exam   General this is a pleasant middle-age female in no distress lying comfortably in bed.  Her skin is warm and dry.  Eyes visual acuity appears to be intact sclera and conjunctive are clear.  Oropharynx is clear mucous membranes moist.  Chest is clear to auscultation there is no labored breathing.  Heart is regular rate and rhythm without murmur gallop or rub  Abdomen is soft nontender with positive bowel sounds.  Musculoskeletal does have wrapping of her left ankle lower leg and foot area capillary refill on exposed toes is intact.  She does have some edema of her left lower extremity compared to the right.  Pedal pulses are somewhat reduced on the right could not be evaluated on the left because of the wrapping.  She does have some edema of her hands bilaterally this is cool to touch nonerythematous she says it is a little tender-radial pulses are intact bilaterally  Neurologic is grossly intact her speech is clear could not appreciate lateralizing findings.  Psych she is alert and oriented pleasant and appropriate Labs reviewed: Recent Labs    10/27/18 1835 11/05/18 1711  NA 141  --   K 4.4  --   CL 105  --   CO2 25  --   GLUCOSE 132*  --   BUN 9  --   CREATININE 1.02* 0.98  CALCIUM 9.1  --    Recent Labs    10/27/18 1835  AST 28  ALT 29  ALKPHOS 147*  BILITOT 0.3  PROT 6.9  ALBUMIN 3.5   Recent Labs    10/27/18 1835 11/05/18 1711  WBC 9.7 10.6*  NEUTROABS 7.0  --   HGB 11.1* 10.7*  HCT 36.9 36.3  MCV 88.1 91.7  PLT 280 295   Lab Results  Component Value Date   TSH 3.23 11/01/2018   Lab Results  Component Value Date   HGBA1C 6.5 11/01/2018   No results found for: CHOL, HDL, LDLCALC, LDLDIRECT, TRIG, CHOLHDL  Significant Diagnostic Results in last 30 days:  Dg Chest 2 View  Result Date:  10/27/2018 CLINICAL DATA:  Near syncope. EXAM: CHEST - 2 VIEW COMPARISON:  None. FINDINGS: The heart size and mediastinal contours are within normal limits. Both lungs are clear. The visualized skeletal structures are unremarkable. IMPRESSION: No active cardiopulmonary disease. Electronically Signed   By: Elsie Stain M.D.   On: 10/27/2018 20:25   Dg Ankle 2 Views Left  Result Date: 10/27/2018 CLINICAL DATA:  Larey Seat at home. EXAM: LEFT ANKLE - 2 VIEW COMPARISON:  None. FINDINGS: Fracture dislocation noted at the ankle. The talus is dislocated laterally in relation to the tibia. Markedly displaced distal fibular fracture and posterior malleolar fracture of the tibia. Transverse fracture through the base of the medial malleolus. The talus is intact. The subtalar joints are maintained. IMPRESSION: Complex comminuted fracture dislocation at the ankle. Electronically Signed   By: Rudie Meyer M.D.   On: 10/27/2018 19:37   Dg Ankle Complete Left  Result Date: 10/29/2018 CLINICAL DATA:  58 year old female who fell 2 days ago breaking ankle, repeat fall today. EXAM: LEFT ANKLE COMPLETE - 3+ VIEW COMPARISON:  Post reduction images 2520 and earlier. FINDINGS: Cast or splint material remains in place about the ankle with underlying trimalleolar fracture not significantly changed in alignment from the post reduction images 2 days ago. Talus and calcaneus appear remain intact. IMPRESSION: Trimalleolar fracture with stable configuration since the post reduction images 2 days ago. Electronically Signed   By: Odessa Fleming M.D.   On: 10/29/2018 19:24   Dg Ankle Left Port  Result Date: 10/27/2018 CLINICAL DATA:  Postreduction. EXAM: PORTABLE LEFT ANKLE - 2 VIEW COMPARISON:  LEFT ankle radiograph October 27, 2018 at 1915 hours FINDINGS: Acute trimalleolar fracture in alignment. Improved alignment of ankle mortise, mild widening of the anterior tibial talar space. Thick fiberglass splint obscures the fine bony detail. IMPRESSION: 1.  Acute trimalleolar fracture in alignment. 2. Improved alignment of ankle mortise. Electronically Signed   By: Awilda Metro M.D.   On: 10/27/2018 21:45   Dg Foot Complete Left  Result Date: 10/29/2018 CLINICAL DATA:  58 year old female who fell 2 days ago breaking ankle, repeat fall today. EXAM: LEFT FOOT - COMPLETE 3+ VIEW COMPARISON:  Ankle series today and earlier. FINDINGS: Cast material projects about the foot and ankle and decreases bone detail on some images. There is new or increased distal soft tissue swelling and stranding at the MTP joint level. No soft tissue gas. No definite metatarsal or phalanx fracture. No definite tarsal fracture. IMPRESSION: 1. Cast material which obscures bone detail on some images. 2. New or increased soft tissue swelling and stranding at the dorsal MTP level. No associated foot fracture identified. 3. Left ankle reported separately. Electronically Signed   By: Odessa Fleming M.D.   On: 10/29/2018 19:26    Assessment/Plan  #1 elevated d-dimer with history of recent right trimalleolar repair- this was discussed with Dr. Alwyn Ren via phone.  Will order a venous Doppler of her extremities upper and lower she also has had some hand swelling although this most likely is dependent edema.  Also will empirically start her on Eliquis 10 mg p.o. twice daily for 10 days awaiting Doppler results-   We will discontinue the aspirin as well as ibuprofen secondary to bleeding risk with the Eliquis.  Also will hold therapy until Doppler results are received and confirmed by provider.  Also monitor vital signs every 4 hours x3 and then every shift if patient develops any chest pain shortness of breath hypoxia notify provider immediately. \   We will update  a CBC and BMP tomorrow to keep an eye on her renal function as well as hemoglobin level- hemoglobin was 10.7 on lab done November 05, 2018  Clinically she appears to be stable with no sign of apparent distress but she will  have to be watched and will obtain Doppler results and empirically start the Eliquis for now.   Per discussion with patient she is not really had any significant history of bleeding in the past except for an episode of colitis apparently about 18 months ago where she had some rectal bleeding which apparently resolved with no reoccurrence  In regards to hand edema she would like to have this just monitored-- we will encourage elevation nursing has talked to her about using pillows to elevate her hands while sleeping---- at this point will monitor she does not want x-rays of the area at this point   [id number]-of note greater than 40 minutes spent assessing patient discussing her status with nursing staff reevaluating patient - and coordinating a plan of care

## 2018-11-16 ENCOUNTER — Non-Acute Institutional Stay (SKILLED_NURSING_FACILITY): Payer: Medicare HMO | Admitting: Internal Medicine

## 2018-11-16 ENCOUNTER — Encounter: Payer: Self-pay | Admitting: Internal Medicine

## 2018-11-16 DIAGNOSIS — F339 Major depressive disorder, recurrent, unspecified: Secondary | ICD-10-CM | POA: Diagnosis not present

## 2018-11-16 DIAGNOSIS — S82892D Other fracture of left lower leg, subsequent encounter for closed fracture with routine healing: Secondary | ICD-10-CM

## 2018-11-16 DIAGNOSIS — R7989 Other specified abnormal findings of blood chemistry: Secondary | ICD-10-CM | POA: Diagnosis not present

## 2018-11-16 LAB — CBC AND DIFFERENTIAL
HEMATOCRIT: 33 — AB (ref 36–46)
Hemoglobin: 10.9 — AB (ref 12.0–16.0)
PLATELETS: 264 (ref 150–399)
WBC: 7

## 2018-11-16 NOTE — Assessment & Plan Note (Signed)
Notify Dr. Renaye Rakers of above

## 2018-11-16 NOTE — Progress Notes (Signed)
NURSING HOME LOCATION:  [facility] ROOM NUMBER:  303-A  CODE STATUS:  Full Code  PCP:  Wynn Banker, MD  [address] [place] [address]  This is a nursing facility follow up for specific acute issue of leg pain with elevated D-dimer..   Interim medical record and care since last [facility] visit was updated with review of diagnostic studies and change in clinical status since last visit were documented.  HPI: Over the weekend I received a call from a staff member stating that the patient related she was getting inadequate pain relief with her present oxycodone doses.  The nurse verified that pain was chiefly at the ankle fractures sites; but because the patient is essentially immobile and wearing a brace; I ordered a d-dimer.  That returned 2/24.  It was done at [facility] labs and revealed a value of 2770 with normals being 190-500.  Mr Trudie Reed, [place] and I consulted and venous Doppler was ordered along with Eliquis novel oral anticoagulant. Aspirin and the high dose NSAIDs were discontinued because of potential risk of bleeding.   She has a history of PTSD and major depression and affiliated with a new psychiatric office yesterday.  Review of systems: She localizes the pain to the operative site at the left ankle; but she also has discomfort of the left lower extremity below the knee.  This typically occurs if she has the leg dependent during the day or is up in PT/OT.  She describes her discomfort as "like an Anaconda constricting the leg" with burning and throbbing.  She states that the narcotic has not been totally effective because of this pain. Perioperatively she received sequential compression devices, early ambulation and Lovenox for DVT prophylaxis.  Discharge to the SNF she was on 81 mg of enteric-coated aspirin.  She was also on 800 mg of ibuprofen every 8 hours as needed. She was also on a maintenance dose of Robaxin as well as an as  needed schedule Robaxin; these were discontinued at admission because of her history of " total body weakness" as prelude to the fall and ankle fractures. By history she has had no DVT or PTE in the past.  When she fractured the right ankle previously she was on Lovenox postoperatively.     Over the weekend she noted swelling of her forearms and hands which she felt was related to dependency.  This has resolved. Despite the high-dose ibuprofen, she denied gastritis or other GI symptoms.  She has no bleeding dyscrasias.   She did have passage of clots following a miscarriage of twins in her 37s. CBC today shows a normochromic, normocytic anemia which is improving.  Hemoglobin is 10.9 and hematocrit 33.2 up from values of 10.3/27.1.  Physical exam:  Pertinent or positive findings:She is edentulous. The left lower extremity is in a firm cast with overlying wrapping.  General appearance: Adequately nourished; no acute distress, increased work of breathing is present.   Lymphatic: No lymphadenopathy about the head, neck, axilla. Eyes: No conjunctival inflammation or lid edema is present. There is no scleral icterus. Ears:  External ear exam shows no significant lesions or deformities.   Nose:  External nasal examination shows no deformity or inflammation. Nasal mucosa are pink and moist without lesions, exudates Oral exam:  Lips and gums are healthy appearing. There is no oropharyngeal erythema or exudate. Neck:  No thyromegaly, masses, tenderness noted.    Heart:  Normal rate and regular rhythm.  S1 and S2 normal without gallop, murmur, click, rub .  Lungs: Chest clear to auscultation without wheezes, rhonchi, rales, rubs. Abdomen: Bowel sounds are normal. Abdomen is soft and nontender with no organomegaly, hernias, masses. GU: Deferred  Extremities:  No cyanosis, clubbing, edema  Neurologic exam : Balance, Rhomberg, finger to nose testing could not be completed due to clinical state Skin: Warm &  dry w/o tenting. No significant lesions or rash.  See summary under each active problem in the Problem List with associated updated therapeutic plan

## 2018-11-16 NOTE — Patient Instructions (Signed)
See assessment and plan under each diagnosis in the problem list and acutely for this visit 

## 2018-11-16 NOTE — Assessment & Plan Note (Signed)
Psychiatric follow-up will continue

## 2018-11-18 ENCOUNTER — Non-Acute Institutional Stay (SKILLED_NURSING_FACILITY): Payer: Medicare HMO | Admitting: Internal Medicine

## 2018-11-18 ENCOUNTER — Encounter: Payer: Self-pay | Admitting: Family Medicine

## 2018-11-18 DIAGNOSIS — R7989 Other specified abnormal findings of blood chemistry: Secondary | ICD-10-CM

## 2018-11-18 DIAGNOSIS — S82892D Other fracture of left lower leg, subsequent encounter for closed fracture with routine healing: Secondary | ICD-10-CM

## 2018-11-18 NOTE — Progress Notes (Signed)
Location:    [facility] Room Number: 341/D Place of Service:  SNF (31) Provider:  Manual Meier, MD  Patient Care Team: Caren Macadam, MD as PCP - General (Family Medicine)  Extended Emergency Contact Information Primary Emergency Contact: Marella Bile Address: [address]          [place], [place] [address] Johnnette Litter of [place] Mobile Phone: [phone] Relation: Daughter  Code Status:  Full Code Goals of care: Advanced Directive information Advanced Directives 11/18/2018  Does Patient Have a Medical Advance Directive? Yes  Type of Advance Directive (No Data)  Does patient want to make changes to medical advance directive? No - Patient declined  Would patient like information on creating a medical advance directive? No - Patient declined     Chief Complaint  Patient presents with  . Acute Visit    F/U Doppler   To rule out DVT  HPI:  Pt is a 58 y.o. female seen today for an acute visit for follow-up of Doppler results of her left lower extremity. Venous Doppler was ordered secondary to concerns for DVT since she did have an elevated d-dimer of 2770.  D-dimer was ordered because she had complained of significant left lower extremity pain.  She was started on Eliquis empirically secondary to concerns for DVT with the elevated d-dimer.  Venous Doppler has returned negative for DVT of the right lower extremity as well as the left.  We did discontinue her aspirin and high-dose NSAID because of potential risk of bleeding with the addition of Eliquis.  Currently she is resting in bed comfortably she did see her orthopedic surgeon Dr. Percell Miller yesterday and he did put a hard cast on her ankle.  With a history of a left ankle fracture which has been followed by surgery      Past Medical History:  Diagnosis Date  . Colitis 2019  . Depression, major   . Diabetes mellitus type II, controlled  ([place])   . Frequent headaches   . GERD (gastroesophageal reflux disease)   . Hepatic steatosis   . Hypertension   . Hypothyroid   . PTSD (post-traumatic stress disorder)    Past Surgical History:  Procedure Laterality Date  . ABDOMINAL HYSTERECTOMY  2015   ovaries remain; done for abnormal pap (LGSIL), endometriosis. Pathology benign for uterus,cervix,endometrium.  Marland Kitchen ANKLE SURGERY Right    fracture repair  . KIDNEY SURGERY     stenting  . ORIF ANKLE FRACTURE Left 11/05/2018   Procedure: OPEN REDUCTION INTERNAL FIXATION (ORIF) LEFT TRIMALLEOLAR ANKLE FRACTURE WITH OR WITHOUT FIXATION OF POSTERIOR LIP, XR 3V LEFT ANKLE;  Surgeon: Renette Butters, MD;  Location: [facility];  Service: Orthopedics;  Laterality: Left;    Allergies  Allergen Reactions  . Bupropion Other (See Comments)    Causes agitation  . Duloxetine Other (See Comments)    Causes involuntary movements  . Sulfa Antibiotics   . Metformin And Related     vomiting  . Paxil [Paroxetine Hcl]     Insomnia; manic  . Prozac [Fluoxetine Hcl]     Hyperactive, insomnia  . Trintellix [Vortioxetine]     Triggers fibromyalgia  . Zoloft [Sertraline Hcl]     Hyperactivity, insomnia    Outpatient Encounter Medications as of 11/18/2018  Medication Sig  . amitriptyline (ELAVIL) 150 MG tablet Take 150 mg by mouth at bedtime.   Marland Kitchen apixaban (ELIQUIS) 5 MG TABS  tablet Take 5 mg by mouth 2 (two) times daily. Take for 7 days stop on 11/22/2018  . atorvastatin (LIPITOR) 20 MG tablet Take 20 mg by mouth every evening.   . diazepam (VALIUM) 2 MG tablet Take 1 tablet (2 mg total) by mouth daily as needed for anxiety (severe anxiety).  Marland Kitchen docusate sodium (COLACE) 100 MG capsule Take 1 capsule (100 mg total) by mouth 2 (two) times daily. To prevent constipation while taking pain medication.  . furosemide (LASIX) 20 MG tablet Take 20 mg by mouth daily.  Marland Kitchen levothyroxine (SYNTHROID, LEVOTHROID) 50 MCG tablet Take 50 mcg by mouth  daily before breakfast.  . loratadine (CLARITIN) 10 MG tablet Take 10 mg by mouth daily as needed for allergies.  Marland Kitchen lurasidone (LATUDA) 80 MG TABS tablet Take 80 mg by mouth daily with breakfast.  . metoprolol succinate (TOPROL-XL) 50 MG 24 hr tablet Take 50 mg by mouth daily. Take with or immediately following a meal.  . Multiple Vitamins-Minerals (MULTIVITAMIN ADULT PO) Take 1 tablet by mouth daily.  Marland Kitchen omeprazole (PRILOSEC) 40 MG capsule Take 40 mg by mouth daily.  . ondansetron (ZOFRAN) 4 MG tablet Take 1 tablet (4 mg total) by mouth every 8 (eight) hours as needed for nausea or vomiting.  Marland Kitchen oxyCODONE (ROXICODONE) 5 MG immediate release tablet Take 1 tablet (5 mg total) by mouth every 4 (four) hours as needed for up to 30 days for breakthrough pain.  . pregabalin (LYRICA) 50 MG capsule Take 50 mg by mouth every 8 (eight) hours as needed (pain).   Marland Kitchen sitaGLIPtin (JANUVIA) 50 MG tablet Take 50 mg by mouth daily.  . vitamin C (ASCORBIC ACID) 500 MG tablet Take 500 mg by mouth daily.  . [DISCONTINUED] acetaminophen (TYLENOL) 500 MG tablet Take 2 tablets (1,000 mg total) by mouth every 8 (eight) hours for 10 days. For Pain.  . [DISCONTINUED] cetirizine (ZYRTEC) 10 MG tablet Take 10 mg by mouth daily.   . [DISCONTINUED] methocarbamol (ROBAXIN) 500 MG tablet Take 1 tablet (500 mg total) by mouth every 8 (eight) hours as needed for muscle spasms.   No facility-administered encounter medications on file as of 11/18/2018.     Review of Systems   General she is not complaining of any fever or chills.  Skin does not complain of rashes or itching.  Head ears eyes nose mouth and throat is not complain of visual changes or sore throat.  Respiratory is not complaining of shortness of breath or cough.  Cardiac does not complain of chest pain has fairly minimal lower extremity edema.  GI is not complaining of abdominal pain nausea vomiting diarrhea constipation.  GU does not complain of  dysuria.  Musculoskeletal still at times has some left ankle discomfort but she appears to be controlled on PRN oxycodone 5 mg.  She also has an order for Tylenol and Lyrica as needed.  Neurologic does not complain of dizziness headache or numbness.  And psych does have a history of PTSD and major depression is followed by psychiatry this has been stable during her stay here  Immunization History  Administered Date(s) Administered  . Influenza,inj,Quad PF,6+ Mos 06/22/2018   Pertinent  Health Maintenance Due  Topic Date Due  . FOOT EXAM  12/14/2018 (Originally 06/12/1973)  . MAMMOGRAM  12/14/2018 (Originally 06/12/2013)  . PAP SMEAR-Modifier  12/14/2018 (Originally 06/12/1984)  . OPHTHALMOLOGY EXAM  12/14/2018 (Originally 06/12/1973)  . URINE MICROALBUMIN  12/14/2018 (Originally 06/12/1973)  . COLONOSCOPY  12/14/2018 (Originally 06/12/2013)  .  HEMOGLOBIN A1C  05/02/2019  . INFLUENZA VACCINE  Completed   No flowsheet data found. Functional Status Survey:    Vitals:   11/18/18 1405  BP: 112/71  Pulse: 87  Resp: 17  Temp: (!) 97.4 F (36.3 C)  TempSrc: Oral    Physical Exam   General this is a pleasant female in no distress lying comfortably in bed.  Her skin is warm and dry.  Eyes visual acuity appears to be intact sclera and conjunctive are clear she has prescription lenses.  Oropharynx is clear mucous membranes moist.  Chest is clear to auscultation there is no labored breathing.  Heart is regular rate and rhythm without murmur gallop or rub  Abdomen is soft nontender with positive bowel sounds   Musculoskeletal she does have a hard cast lower left leg she does have toes protruding the capillary refill is intact she is able to move her toes and she does have positive touch sensation-her foot is warm to touch.  Appears able to move her other extremities at baseline limited exam since she is in bed.  Neurologic is grossly intact her speech is clear no lateralizing  findings.  Psych she is alert and oriented pleasant and appropriate  Labs reviewed:   November 16, 2018.  WBC 7.0 hemoglobin 10.9 platelets 264 Recent Labs    10/27/18 1835 11/05/18 1711  NA 141  --   K 4.4  --   CL 105  --   CO2 25  --   GLUCOSE 132*  --   BUN 9  --   CREATININE 1.02* 0.98  CALCIUM 9.1  --    Recent Labs    10/27/18 1835  AST 28  ALT 29  ALKPHOS 147*  BILITOT 0.3  PROT 6.9  ALBUMIN 3.5   Recent Labs    10/27/18 1835 11/05/18 1711  WBC 9.7 10.6*  NEUTROABS 7.0  --   HGB 11.1* 10.7*  HCT 36.9 36.3  MCV 88.1 91.7  PLT 280 295   Lab Results  Component Value Date   TSH 3.23 11/01/2018   Lab Results  Component Value Date   HGBA1C 6.5 11/01/2018   No results found for: CHOL, HDL, LDLCALC, LDLDIRECT, TRIG, CHOLHDL  Significant Diagnostic Results in last 30 days:  Dg Chest 2 View  Result Date: 10/27/2018 CLINICAL DATA:  Near syncope. EXAM: CHEST - 2 VIEW COMPARISON:  None. FINDINGS: The heart size and mediastinal contours are within normal limits. Both lungs are clear. The visualized skeletal structures are unremarkable. IMPRESSION: No active cardiopulmonary disease. Electronically Signed   By: Staci Righter M.D.   On: 10/27/2018 20:25   Dg Ankle 2 Views Left  Result Date: 10/27/2018 CLINICAL DATA:  [place] home. EXAM: LEFT ANKLE - 2 VIEW COMPARISON:  None. FINDINGS: Fracture dislocation noted at the ankle. The talus is dislocated laterally in relation to the tibia. Markedly displaced distal fibular fracture and posterior malleolar fracture of the tibia. Transverse fracture through the base of the medial malleolus. The talus is intact. The subtalar joints are maintained. IMPRESSION: Complex comminuted fracture dislocation at the ankle. Electronically Signed   By: Marijo Sanes M.D.   On: 10/27/2018 19:37   Dg Ankle Complete Left  Result Date: 10/29/2018 CLINICAL DATA:  58 year old female who fell 2 days ago breaking ankle, repeat fall today.  EXAM: LEFT ANKLE COMPLETE - 3+ VIEW COMPARISON:  Post reduction images 2520 and earlier. FINDINGS: Cast or splint material remains in place about the ankle with  underlying trimalleolar fracture not significantly changed in alignment from the post reduction images 2 days ago. Talus and calcaneus appear remain intact. IMPRESSION: Trimalleolar fracture with stable configuration since the post reduction images 2 days ago. Electronically Signed   By: Genevie Ann M.D.   On: 10/29/2018 19:24   Dg Ankle Left Port  Result Date: 10/27/2018 CLINICAL DATA:  Postreduction. EXAM: PORTABLE LEFT ANKLE - 2 VIEW COMPARISON:  LEFT ankle radiograph October 27, 2018 at 1915 hours FINDINGS: Acute trimalleolar fracture in alignment. Improved alignment of ankle mortise, mild widening of the anterior tibial talar space. Thick fiberglass splint obscures the fine bony detail. IMPRESSION: 1. Acute trimalleolar fracture in alignment. 2. Improved alignment of ankle mortise. Electronically Signed   By: Elon Alas M.D.   On: 10/27/2018 21:45   Dg Foot Complete Left  Result Date: 10/29/2018 CLINICAL DATA:  58 year old female who fell 2 days ago breaking ankle, repeat fall today. EXAM: LEFT FOOT - COMPLETE 3+ VIEW COMPARISON:  Ankle series today and earlier. FINDINGS: Cast material projects about the foot and ankle and decreases bone detail on some images. There is new or increased distal soft tissue swelling and stranding at the MTP joint level. No soft tissue gas. No definite metatarsal or phalanx fracture. No definite tarsal fracture. IMPRESSION: 1. Cast material which obscures bone detail on some images. 2. New or increased soft tissue swelling and stranding at the dorsal MTP level. No associated foot fracture identified. 3. Left ankle reported separately. Electronically Signed   By: Genevie Ann M.D.   On: 10/29/2018 19:26    Assessment/Plan  #1- history of elevated d-dimer- Dopplers were reassuring negative for DVT left lower  extremity- however d-dimer was significantly elevated and at this point will continue Eliquis empirically will reduce down to 5 mg twice daily after the 7-day course of 10 mg twice daily is completed.  This was discussed with Dr. Linna Darner and she he appears to have met 3 criteria for an elevated d-dimer- and thus will continue the Eliquis as noted above  Will recheck the d-dimer value when she is more ambulatory.  Also will update a BMP to ensure stability of hemoglobin has shown stability on lab done earlier this week at 10.9.  I did discuss the plan with patient who is agreeable to this.  2 history of left ankle fracture status post repair she is now in a hard cast and followed by Dr. Percell Miller at this point pain appears to be controlled.  [id number]

## 2018-11-19 ENCOUNTER — Other Ambulatory Visit: Payer: Self-pay | Admitting: Internal Medicine

## 2018-11-19 MED ORDER — OXYCODONE HCL 5 MG PO TABS: 5.0000 mg | ORAL_TABLET | ORAL | 0 refills | Status: DC | PRN

## 2018-11-20 ENCOUNTER — Telehealth: Payer: Self-pay | Admitting: Internal Medicine

## 2018-11-20 DIAGNOSIS — S82892D Other fracture of left lower leg, subsequent encounter for closed fracture with routine healing: Secondary | ICD-10-CM

## 2018-11-20 MED ORDER — OXYCODONE HCL 5 MG PO TABS: 5.0000 mg | ORAL_TABLET | ORAL | 0 refills | Status: DC | PRN

## 2018-11-20 NOTE — Telephone Encounter (Signed)
Pharmacist from [place] called and reported that pt needs oxycodone 5mg  IR one q 4 hr prn breakthrough pain x 30 days, but just will need emergency supply.  Oddly, looking in epic, pt had 20 pills sent to [facility] just yesterday, 2/28, by [facility], but apparently not received in time for delivery.  Agreed to only send 72 hr supply to the [facility] on [facility] backup pharmacy so pt would not be w/o her medication.  Facility [facility]) will need to plan better for this in the future and should get the 20 pills from [place] tomorrow and the 18 from [facility] ([facility]    Rayli Wiederhold L. Akyla Vavrek, D.O. Geriatrics [facility] [address], [place] [address] Cell Phone (Mon-Fri 8am-5pm):  [phone] On Call:  [phone] & follow prompts after 5pm & weekends Office Phone:  [phone] Office Fax:  [phone]

## 2018-11-22 ENCOUNTER — Encounter: Payer: Self-pay | Admitting: Internal Medicine

## 2018-11-22 DIAGNOSIS — S82892D Other fracture of left lower leg, subsequent encounter for closed fracture with routine healing: Secondary | ICD-10-CM

## 2018-11-22 LAB — CBC AND DIFFERENTIAL
HCT: 33 — AB (ref 36–46)
Hemoglobin: 10.9 — AB (ref 12.0–16.0)
Platelets: 264 (ref 150–399)
WBC: 7

## 2018-11-25 ENCOUNTER — Non-Acute Institutional Stay (SKILLED_NURSING_FACILITY): Payer: Medicare HMO | Admitting: Internal Medicine

## 2018-11-25 ENCOUNTER — Encounter: Payer: Self-pay | Admitting: Internal Medicine

## 2018-11-25 DIAGNOSIS — F419 Anxiety disorder, unspecified: Secondary | ICD-10-CM

## 2018-11-25 DIAGNOSIS — S82892D Other fracture of left lower leg, subsequent encounter for closed fracture with routine healing: Secondary | ICD-10-CM
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
## 2018-11-26 ENCOUNTER — Other Ambulatory Visit: Payer: Self-pay

## 2018-11-26 ENCOUNTER — Encounter: Payer: Self-pay | Admitting: Internal Medicine

## 2018-11-26 DIAGNOSIS — S82892D Other fracture of left lower leg, subsequent encounter for closed fracture with routine healing: Secondary | ICD-10-CM

## 2018-11-26 DIAGNOSIS — F419 Anxiety disorder, unspecified: Secondary | ICD-10-CM

## 2018-11-26 MED ORDER — PREGABALIN 50 MG PO CAPS: 50.0000 mg | ORAL_CAPSULE | Freq: Three times a day (TID) | ORAL | 0 refills | Status: DC | PRN

## 2018-11-26 MED ORDER — DOCUSATE SODIUM 100 MG PO CAPS: 100.0000 mg | ORAL_CAPSULE | Freq: Two times a day (BID) | ORAL | 0 refills | Status: DC

## 2018-11-26 MED ORDER — OXYCODONE HCL 5 MG PO TABS: 5.0000 mg | ORAL_TABLET | Freq: Four times a day (QID) | ORAL | 0 refills | Status: AC | PRN

## 2018-11-26 MED ORDER — DIAZEPAM 2 MG PO TABS: 2.0000 mg | ORAL_TABLET | Freq: Every day | ORAL | 0 refills | Status: DC | PRN

## 2018-11-26 MED ORDER — LEVOTHYROXINE SODIUM 50 MCG PO TABS: 50.0000 ug | ORAL_TABLET | Freq: Every day | ORAL | 0 refills | Status: DC

## 2018-11-26 MED ORDER — APIXABAN 5 MG PO TABS: 5.0000 mg | ORAL_TABLET | Freq: Two times a day (BID) | ORAL | 0 refills | Status: DC

## 2018-11-26 MED ORDER — ATORVASTATIN CALCIUM 20 MG PO TABS: 20.0000 mg | ORAL_TABLET | Freq: Every evening | ORAL | 0 refills | Status: DC

## 2018-11-26 MED ORDER — OXYCODONE HCL 5 MG PO TABS: 5.0000 mg | ORAL_TABLET | Freq: Four times a day (QID) | ORAL | 0 refills | Status: DC | PRN

## 2018-11-26 MED ORDER — OMEPRAZOLE 40 MG PO CPDR: 40.0000 mg | DELAYED_RELEASE_CAPSULE | Freq: Every day | ORAL | 0 refills | Status: DC

## 2018-11-26 MED ORDER — METOPROLOL SUCCINATE ER 50 MG PO TB24: 50.0000 mg | ORAL_TABLET | Freq: Every day | ORAL | 0 refills | Status: DC

## 2018-11-26 MED ORDER — FUROSEMIDE 20 MG PO TABS: 20.0000 mg | ORAL_TABLET | Freq: Every day | ORAL | 0 refills | Status: DC

## 2018-11-26 MED ORDER — AMITRIPTYLINE HCL 150 MG PO TABS: 150.0000 mg | ORAL_TABLET | Freq: Every day | ORAL | 0 refills | Status: DC

## 2018-11-26 MED ORDER — LORATADINE 10 MG PO TABS: 10.0000 mg | ORAL_TABLET | Freq: Every day | ORAL | 0 refills | Status: DC | PRN

## 2018-11-26 MED ORDER — SITAGLIPTIN PHOSPHATE 50 MG PO TABS: 50.0000 mg | ORAL_TABLET | Freq: Every day | ORAL | 0 refills | Status: DC

## 2018-11-26 MED ORDER — LURASIDONE HCL 80 MG PO TABS: 80.0000 mg | ORAL_TABLET | Freq: Every day | ORAL | 0 refills | Status: DC

## 2018-11-26 MED ORDER — VITAMIN C 500 MG PO TABS: 500.0000 mg | ORAL_TABLET | Freq: Every day | ORAL | 0 refills | Status: DC

## 2018-11-29 ENCOUNTER — Ambulatory Visit (INDEPENDENT_AMBULATORY_CARE_PROVIDER_SITE_OTHER): Payer: Medicare HMO | Admitting: Family Medicine

## 2018-11-29 ENCOUNTER — Encounter: Payer: Self-pay | Admitting: Family Medicine

## 2018-11-29 VITALS — BP 110/68 | HR 76

## 2018-11-29 DIAGNOSIS — E118 Type 2 diabetes mellitus with unspecified complications: Secondary | ICD-10-CM | POA: Diagnosis not present

## 2018-11-29 DIAGNOSIS — R7989 Other specified abnormal findings of blood chemistry: Secondary | ICD-10-CM

## 2018-11-29 DIAGNOSIS — I10 Essential (primary) hypertension: Secondary | ICD-10-CM

## 2018-11-29 DIAGNOSIS — D649 Anemia, unspecified: Secondary | ICD-10-CM

## 2018-11-29 DIAGNOSIS — E039 Hypothyroidism, unspecified: Secondary | ICD-10-CM

## 2018-11-29 DIAGNOSIS — M797 Fibromyalgia: Secondary | ICD-10-CM

## 2018-11-29 DIAGNOSIS — F329 Major depressive disorder, single episode, unspecified: Secondary | ICD-10-CM

## 2018-11-29 DIAGNOSIS — K219 Gastro-esophageal reflux disease without esophagitis: Secondary | ICD-10-CM

## 2018-11-29 DIAGNOSIS — F4310 Post-traumatic stress disorder, unspecified: Secondary | ICD-10-CM

## 2018-11-29 LAB — COMPREHENSIVE METABOLIC PANEL
ALT: 37 U/L — ABNORMAL HIGH (ref 0–35)
AST: 26 U/L (ref 0–37)
Albumin: 4.4 g/dL (ref 3.5–5.2)
Alkaline Phosphatase: 181 U/L — ABNORMAL HIGH (ref 39–117)
BUN: 13 mg/dL (ref 6–23)
CO2: 29 mEq/L (ref 19–32)
Calcium: 9.7 mg/dL (ref 8.4–10.5)
Chloride: 99 mEq/L (ref 96–112)
Creatinine, Ser: 1 mg/dL (ref 0.40–1.20)
GFR: 57.47 mL/min — ABNORMAL LOW (ref >60.00)
GLUCOSE: 88 mg/dL (ref 70–99)
Potassium: 4 mEq/L (ref 3.5–5.1)
Sodium: 138 mEq/L (ref 135–145)
Total Bilirubin: 0.4 mg/dL (ref 0.2–1.2)
Total Protein: 7.4 g/dL (ref 6.0–8.3)

## 2018-11-29 LAB — CBC WITH DIFFERENTIAL/PLATELET
BASOS ABS: 0 10*3/uL (ref 0.0–0.1)
Basophils Relative: 0.7 % (ref 0.0–3.0)
Eosinophils Absolute: 0.1 10*3/uL (ref 0.0–0.7)
Eosinophils Relative: 1.6 % (ref 0.0–5.0)
HCT: 36.9 % (ref 36.0–46.0)
Hemoglobin: 11.9 g/dL — ABNORMAL LOW (ref 12.0–15.0)
Lymphocytes Relative: 35 % (ref 12.0–46.0)
Lymphs Abs: 2.2 10*3/uL (ref 0.7–4.0)
MCHC: 32.2 g/dL (ref 30.0–36.0)
MCV: 84.3 fl (ref 78.0–100.0)
Monocytes Absolute: 0.4 10*3/uL (ref 0.1–1.0)
Monocytes Relative: 6.2 % (ref 3.0–12.0)
NEUTROS ABS: 3.6 10*3/uL (ref 1.4–7.7)
NEUTROS PCT: 56.5 % (ref 43.0–77.0)
PLATELETS: 268 10*3/uL (ref 150.0–400.0)
RBC: 4.38 Mil/uL (ref 3.87–5.11)
RDW: 16.8 % — ABNORMAL HIGH (ref 11.5–15.5)
WBC: 6.3 10*3/uL (ref 4.0–10.5)

## 2018-11-29 MED ORDER — BLOOD GLUCOSE MONITOR KIT: PACK | 0 refills | Status: DC

## 2018-11-29 MED ORDER — PREGABALIN 50 MG PO CAPS: 50.0000 mg | ORAL_CAPSULE | Freq: Two times a day (BID) | ORAL | 5 refills | Status: DC

## 2018-11-29 NOTE — Progress Notes (Signed)
Tiffany Reese DOB: [birth year] Encounter date: 11/29/2018  This is a 58 y.o. female who presents with Chief Complaint  Patient presents with  . Follow-up    rehab, numbess in the ring finger, pinky finger and hand on left    History of present illness: Woke up this morning and had some numbness, tingling 4th and 5th digist left hand. Woke up this way. Just hasn't gone away. Has happened to her before. Happens on and off. Has a bad cervical discs and sometimes it will flare. Always happens in same fingers. No pain, no weakness.  Rehab facility was ok. She is getting around now. She is very cautious and feels that anxiety is up because of this. Worried about falling.  Has Occ health coming tomorrow.  PT is coming weekly (don't want to use up all time right now); she is casted until March 25th. Had plate and 11 screws placed. There was also some concern about blood clot so she is on eliiquis although states that [place] was negative. Although eliquis bottle states to stop on 3/2 (script was written on 3/6 so this doesn't not match up) the [facility] from [facility] say to continue and that d dimer will be repeated but there is no end date.   Hasn't been drinking soda. Is finishing her vape oil (almost out) and then not getting refilled. Really working on getting her health back on track.   Not eating as much of sugar. Weight was 277 when she left rehab. (didn't weigh here today). Thinks she was just drinking too many calories. She is limited with food prep since she cannot stand, so she is getting quick thinks from fridge (but not fast food)  Has appointment tomorrow with psychiatrist. Dr. Marlou Sa with [facility]. She was there for intake evaluation but is worried about area of town that facility is in; not sure she will feel comfortable there.    Anxiety is debilitating for her. Feels she needs higher dose of valium. Tries to talk herself down but stress keeps building and  building and then starts to tense up.   States that pain control has been "pretty bad". Throbbing and occasionally gets sharp pains shooting through "like lightening bolt". Is getting better, but still bothers her more than she thought it would. Much worse than when she fractured other foot. Feels it pulling on knees; feels like contralateral knee hurting from hopping on right leg. Doesn't have room for walker to fit into bathroom, so she has to hop around bathroom which makes her worry about falling. Taking pain medication q 6 hours, sometimes less (yesterday took 3 pain pills).  Avoiding ibuprofen at request of rehab facility doctor who worried about taking ibuprofen along with the eliquis. Takes tylenol as well.    Bought lift chair and sleeping in this; more comfortable than hospital bed.    Allergies  Allergen Reactions  . Bupropion Other (See Comments)    Causes agitation  . Duloxetine Other (See Comments)    Causes involuntary movements  . Sulfa Antibiotics   . Metformin And Related     vomiting  . Paxil [Paroxetine Hcl]     Insomnia; manic  . Prozac [Fluoxetine Hcl]     Hyperactive, insomnia  . Trintellix [Vortioxetine]     Triggers fibromyalgia  . Zoloft [Sertraline Hcl]     Hyperactivity, insomnia   Current Meds  Medication Sig  . amitriptyline (ELAVIL) 150 MG tablet Take 1 tablet (150 mg total) by  mouth at bedtime.  Marland Kitchen apixaban (ELIQUIS) 5 MG TABS tablet Take 1 tablet (5 mg total) by mouth 2 (two) times daily. Take for 7 days stop on 11/22/2018  . atorvastatin (LIPITOR) 20 MG tablet Take 1 tablet (20 mg total) by mouth every evening.  . diazepam (VALIUM) 2 MG tablet Take 1 tablet (2 mg total) by mouth daily as needed for anxiety (severe anxiety).  Marland Kitchen docusate sodium (COLACE) 100 MG capsule Take 1 capsule (100 mg total) by mouth 2 (two) times daily. To prevent constipation while taking pain medication.  . furosemide (LASIX) 20 MG tablet Take 1 tablet (20 mg total) by mouth  daily. (Patient taking differently: Take 20 mg by mouth daily. PRN)  . levothyroxine (SYNTHROID, LEVOTHROID) 50 MCG tablet Take 1 tablet (50 mcg total) by mouth daily before breakfast.  . loratadine (CLARITIN) 10 MG tablet Take 1 tablet (10 mg total) by mouth daily as needed for allergies.  Marland Kitchen lurasidone (LATUDA) 80 MG TABS tablet Take 1 tablet (80 mg total) by mouth daily with breakfast.  . metoprolol succinate (TOPROL-XL) 50 MG 24 hr tablet Take 1 tablet (50 mg total) by mouth daily. Take with or immediately following a meal.  . Multiple Vitamins-Minerals (MULTIVITAMIN ADULT PO) Take 1 tablet by mouth daily.  Marland Kitchen omeprazole (PRILOSEC) 40 MG capsule Take 1 capsule (40 mg total) by mouth daily.  Marland Kitchen oxyCODONE (ROXICODONE) 5 MG immediate release tablet Take 1 tablet (5 mg total) by mouth every 6 (six) hours as needed for up to 30 days for breakthrough pain.  . pregabalin (LYRICA) 50 MG capsule Take 1 capsule (50 mg total) by mouth 2 (two) times daily.  . sitaGLIPtin (JANUVIA) 50 MG tablet Take 1 tablet (50 mg total) by mouth daily.  . vitamin C (ASCORBIC ACID) 500 MG tablet Take 1 tablet (500 mg total) by mouth daily.  . [DISCONTINUED] ondansetron (ZOFRAN) 4 MG tablet Take 1 tablet (4 mg total) by mouth every 8 (eight) hours as needed for nausea or vomiting.  . [DISCONTINUED] pregabalin (LYRICA) 50 MG capsule Take 1 capsule (50 mg total) by mouth every 8 (eight) hours as needed (pain).    Review of Systems  Constitutional: Positive for activity change (getting up and around more; just limited with no weight bearing). Negative for appetite change, chills, fatigue and fever.  Respiratory: Negative for cough, chest tightness, shortness of breath and wheezing.   Cardiovascular: Negative for chest pain, palpitations and leg swelling.  Gastrointestinal: Negative for abdominal pain, constipation, diarrhea, nausea and vomiting.  Genitourinary: Negative for difficulty urinating.  Musculoskeletal: Positive for  arthralgias. Negative for neck pain and neck stiffness.  Neurological: Negative for dizziness and light-headedness.  Psychiatric/Behavioral: Negative for sleep disturbance and suicidal ideas. The patient is nervous/anxious.     Objective:  BP 110/68 (BP Location: Left Arm, Patient Position: Sitting, Cuff Size: Normal)   Pulse 76   SpO2 97%       BP Readings from Last 3 Encounters:  11/29/18 110/68  11/25/18 128/78  11/18/18 112/71   Wt Readings from Last 3 Encounters:  11/16/18 290 lb 8 oz (131.8 kg)  11/10/18 291 lb 15.5 oz (132.4 kg)  11/05/18 281 lb 15.5 oz (127.9 kg)    Physical Exam Constitutional:      General: She is not in acute distress.    Appearance: She is well-developed.  Cardiovascular:     Rate and Rhythm: Normal rate and regular rhythm.     Heart sounds: Normal heart sounds.  No murmur. No friction rub.  Pulmonary:     Effort: Pulmonary effort is normal. No respiratory distress.     Breath sounds: Normal breath sounds. No wheezing or rales.  Musculoskeletal:     Right lower leg: No edema.     Left lower leg: No edema.  Neurological:     Mental Status: She is alert and oriented to person, place, and time.  Psychiatric:        Attention and Perception: Attention normal.        Mood and Affect: Mood is anxious.        Speech: Speech normal.        Behavior: Behavior normal.        Thought Content: Thought content normal.     Comments: Yamili is much more alert today and very invested in her medical conditions. She is very aware of medical issues that occurred while in rehab. She is motivated to keep getting better.      Assessment/Plan 1. Controlled type 2 diabetes mellitus with complication, without long-term current use of insulin ([name]) She is not aware of what A1C was when the Januvia was started. Her sugars in rehab were in decent range. She has eliminated soda and sweets. We are going to leave off the Januvia and have her monitor home sugars. Her last  A1C was 6.5. Although I do not want her to have uncontrolled diabetes, I also want to avoid hypoglycemia for her as this would be a fall risk. I have asked her to track some fasting numbers over the next couple of weeks. We can restart medication if needed, but I suspect with her increased mobility and changes in diet we may be able to diet control her diabetes. - blood glucose meter kit and supplies KIT; Dispense based on patient and insurance preference. Use up to four times daily as directed. (FOR ICD-9 250.00, 250.01).  Dispense: 1 each; Refill: 0 - Amb Referral to Nutrition and Diabetic E: patient is interested in learning more about eating healthy. Since she is limited with food prep ability at this point, I think meeting w dietician to get ideas on how she can do this effectively will be very helpful. - Comprehensive metabolic panel; Future - Comprehensive metabolic panel  2. Positive D dimer Levels were elevated over 2000 in rehab facility. Instructions for continuation of eliquis were not clear in terms of duration. We have repeated a d dimer today. I cannot see scan but it is documented that she was negative for clot. May need to touch base with ortho regarding need for anticoagulation at this point. - D-dimer, Quantitative  3. Fibromyalgia She does get benefit from lyrica. Previously on BID dosing. Will return to this. - pregabalin (LYRICA) 50 MG capsule; Take 1 capsule (50 mg total) by mouth 2 (two) times daily.  Dispense: 60 capsule; Refill: 5  4. Anemia, unspecified type - CBC with Differential/Platelet; Future - CBC with Differential/Platelet  5. Hypothyroidism, unspecified type Has been stable on synthroid. 6. Gastroesophageal reflux disease, esophagitis presence not specified Stable on omeprazole.  7. Hypertension I have advised her to monitor at home. BP was on lower end here. I do not want her getting hypotensive. Consider decrease in medication to prevent hypotension  pending home report.  8. PTSD (post-traumatic stress disorder) Has appointment with psychiatry tomorrow. Continue current medications.  9. Major depressive disorder with current active episode, unspecified depression episode severity, unspecified whether recurrent See above. Currently stable.  Patient instructions:  Call ortho regarding refill needed on oxycodone.   Talk with psychiatry tomorrow about concerns with:  Valium (previously taking 64m BID prn) and [place] had restarted 260mdaily prn as "emergency med", but psychiatry should take over prescribing this if they feel appropriate.   Talk with them about amitriptyline dose and your concerns with increase from 10025mo 150m69m Check blood sugar a few days/week - you can alternate when you check - but all should be fasting (at least 2-3 hours with no eating). Let me know if you regularly are getting numbers that are 150 or higher. For not please hold the Januvia.   High protein low carb diet.   High protein/low carb yogurt (greek yogurt).   Resume centrum silver, atorvastatin. You can keep the furosemide (lasix) on hand if needed for swelling, but please use with caution as this can drop your blood pressure lower. Please also check your blood pressures daily and report these back to me in 2 weeks.    Return in about 2 months (around 01/29/2019).    JuneMicheline Rough

## 2018-11-29 NOTE — Patient Instructions (Addendum)
Call ortho regarding refill needed on oxycodone.   Talk with psychiatry tomorrow about concerns with:  Valium (previously taking 5mg  BID prn) and then you were weaned when you arrived in [place]. We had restarted 2mg  daily prn as "emergency med", but psychiatry should take over prescribing this if they feel appropriate.   Talk with them about amitriptyline dose and your concerns with increase from 100mg  to 150mg .   Check blood sugar a few days/week - you can alternate when you check - but all should be fasting (at least 2-3 hours with no eating). Let me know if you regularly are getting numbers that are 150 or higher. For not please hold the Januvia.   High protein low carb diet.   High protein/low carb yogurt (greek yogurt).   Resume centrum silver, atorvastatin. You can keep the furosemide (lasix) on hand if needed for swelling, but please use with caution as this can drop your blood pressure lower. Please also check your blood pressures daily and report these back to me in 2 weeks.

## 2018-11-30 ENCOUNTER — Encounter: Payer: Self-pay | Admitting: Family Medicine

## 2018-11-30 ENCOUNTER — Other Ambulatory Visit: Payer: Self-pay | Admitting: Family Medicine

## 2018-11-30 DIAGNOSIS — M797 Fibromyalgia: Secondary | ICD-10-CM

## 2018-11-30 LAB — D-DIMER, QUANTITATIVE: D-Dimer, Quant: 0.9 mcg/mL FEU — ABNORMAL HIGH (ref <0.50)

## 2018-11-30 NOTE — Telephone Encounter (Signed)
Patient would like Rx sent to different pharmacy- this is a non delegated Rx- sent for office review of change request.

## 2018-11-30 NOTE — Telephone Encounter (Signed)
Copied from CRM [id number]. Topic: Quick Communication - Rx Refill/Question >> Nov 30, 2018  4:12 PM Mcneil, Jannifer Rodney wrote: Pt stated the Rx for pregabalin (LYRICA) 50 MG capsule [facility]  Medication: pregabalin (LYRICA) 50 MG capsule   Preferred Pharmacy (with phone number or street name): [id number] [place], [place] - [address] [phone] (Phone)   [phone] (Fax)  Agent: Please be advised that RX refills may take up to 3 business days. We ask that you follow-up with your pharmacy.

## 2018-11-30 NOTE — Telephone Encounter (Signed)
Forwarding to appropriate office.  °

## 2018-12-01 ENCOUNTER — Other Ambulatory Visit: Payer: Self-pay | Admitting: Family Medicine

## 2018-12-01 DIAGNOSIS — M797 Fibromyalgia: Secondary | ICD-10-CM

## 2018-12-01 MED ORDER — PREGABALIN 50 MG PO CAPS: 50.0000 mg | ORAL_CAPSULE | Freq: Two times a day (BID) | ORAL | 5 refills | Status: DC

## 2018-12-01 NOTE — Addendum Note (Signed)
Addended by: Solon Augusta on: 12/01/2018 10:45 AM   Modules accepted: Orders

## 2018-12-02 ENCOUNTER — Telehealth: Payer: Self-pay | Admitting: Family Medicine

## 2018-12-02 NOTE — Telephone Encounter (Signed)
I called pt to inquire what the requested Diflucan was for. Her home health nurse, Agustin Cree called it in per pt.   She has yeast under her left breast and under the left side of her abd under her fold.  She [facility] [address]   She also requested all the other pharmacies in her chart be deleted except for [facility] [address]   I let her know I would do that.

## 2018-12-02 NOTE — Telephone Encounter (Signed)
Copied from CRM [id number]. Topic: Quick Communication - Rx Refill/Question >> Dec 02, 2018 12:12 PM Richarda Blade wrote: Medication: Would like something for a yeast infection (diflucan?)  Has the patient contacted their pharmacy? No. (Agent: If no, request that the patient contact the pharmacy for the refill.) Has never been prescribed this  Preferred Pharmacy (with phone number or street name): [id number] [place], [place] - [address] [phone] (Phone) [phone] (Fax)    Agent: Please be advised that RX refills may take up to 3 business days. We ask that you follow-up with your pharmacy.

## 2018-12-03 ENCOUNTER — Other Ambulatory Visit: Payer: Self-pay | Admitting: Family Medicine

## 2018-12-03 MED ORDER — CLOTRIMAZOLE 1 % EX CREA: 1.0000 "application " | TOPICAL_CREAM | Freq: Two times a day (BID) | CUTANEOUS | 1 refills | Status: DC

## 2018-12-03 NOTE — Telephone Encounter (Signed)
ATC. Phone was busy. CRM created.

## 2018-12-03 NOTE — Telephone Encounter (Signed)
I have sent in clotrimazole. Try to keep skin clean and dry. Apply when skin is dry. If any worsening we need to see it. Advise to keep skin separated (ie when sitting at home can tuck t-shirt between skin/breast fold) to keep moisture controlled.

## 2018-12-03 NOTE — Telephone Encounter (Signed)
Pt called stating her daughter is on the way to the pharmacy. Pt has to rely on others to get medications because she has a broken ankle. Please send in.  [facility] [address]

## 2018-12-08 ENCOUNTER — Telehealth: Payer: Self-pay | Admitting: Family Medicine

## 2018-12-08 NOTE — Telephone Encounter (Unsigned)
Copied from CRM [id number]. Topic: Quick Communication - Home Health Verbal Orders >> Dec 08, 2018  9:34 AM Wyonia Hough E wrote: Caller/Agency: [facility]  Callback Number: [phone] Vm can be left  Requesting Home health aid orders and lab orders (for symptoms Pt is having with one of her medications/ muscles weakness possibly an adverse effect from atorvastatin (LIPITOR) 20 MG tablet) Frequency: 1x or 2x a week for bathing (until she can get cast off)

## 2018-12-09 ENCOUNTER — Other Ambulatory Visit: Payer: Self-pay

## 2018-12-09 MED ORDER — FLUCONAZOLE 150 MG PO TABS: 150.0000 mg | ORAL_TABLET | Freq: Once | ORAL | 0 refills | Status: AC

## 2018-12-09 NOTE — Telephone Encounter (Signed)
That message is a little confusing.   OK for home health aid. I thought she had this from hospital discharge anyway, but if not, yes she needs assistance.   If she is having muscle cramping/weakness then just hold the lipitor for a couple of weeks and see if this helps. If symptoms not improving within next week (or if worsening) let us know.  Hopefully that addressed concerns.

## 2018-12-09 NOTE — Telephone Encounter (Signed)
Spoke with Annabelle Harman ok for orders has been given. Aware to stop medication.

## 2018-12-09 NOTE — Telephone Encounter (Signed)
[facility] Not [facility] current [facility]. Ok to fill?

## 2018-12-15 ENCOUNTER — Other Ambulatory Visit: Payer: Self-pay | Admitting: Family Medicine

## 2018-12-15 NOTE — Telephone Encounter (Signed)
Left message for patient to call back  

## 2018-12-17 ENCOUNTER — Other Ambulatory Visit: Payer: Self-pay | Admitting: Internal Medicine

## 2018-12-21 ENCOUNTER — Other Ambulatory Visit: Payer: Self-pay | Admitting: Internal Medicine

## 2018-12-25 ENCOUNTER — Other Ambulatory Visit: Payer: Self-pay | Admitting: Internal Medicine

## 2018-12-25 ENCOUNTER — Other Ambulatory Visit: Payer: Self-pay | Admitting: Family Medicine

## 2018-12-28 NOTE — Telephone Encounter (Signed)
Left message for patient to call back  

## 2019-01-10 ENCOUNTER — Telehealth: Payer: Self-pay | Admitting: Family Medicine

## 2019-01-10 NOTE — Telephone Encounter (Signed)
Copied from CRM [id number]. Topic: Quick Communication - See Telephone Encounter >> Jan 10, 2019  2:50 PM Louie Bun, Rosey Bath D wrote: CRM for notification. See Telephone encounter for: 01/10/19. [place] with [facility] called and would like to request verbal orders for occupational therapy for patient as follow: 2X a week for 3 weeks. She can be reached and leave a safe message to [phone].

## 2019-01-10 NOTE — Telephone Encounter (Signed)
Unable to reach patient. Message will be closed. 

## 2019-01-10 NOTE — Telephone Encounter (Signed)
ok 

## 2019-01-10 NOTE — Telephone Encounter (Signed)
Ok for orders? 

## 2019-01-11 ENCOUNTER — Other Ambulatory Visit: Payer: Self-pay

## 2019-01-11 ENCOUNTER — Ambulatory Visit (HOSPITAL_COMMUNITY): Payer: Medicare HMO | Admitting: Psychiatry

## 2019-01-11 NOTE — Telephone Encounter (Signed)
Orders have been given.  

## 2019-01-17 ENCOUNTER — Ambulatory Visit (INDEPENDENT_AMBULATORY_CARE_PROVIDER_SITE_OTHER): Payer: Medicare HMO | Admitting: Psychiatry

## 2019-01-17 ENCOUNTER — Encounter (HOSPITAL_COMMUNITY): Payer: Self-pay | Admitting: Psychiatry

## 2019-01-17 ENCOUNTER — Other Ambulatory Visit: Payer: Self-pay

## 2019-01-17 DIAGNOSIS — F419 Anxiety disorder, unspecified: Secondary | ICD-10-CM | POA: Diagnosis not present

## 2019-01-17 DIAGNOSIS — F4310 Post-traumatic stress disorder, unspecified: Secondary | ICD-10-CM

## 2019-01-17 DIAGNOSIS — F319 Bipolar disorder, unspecified: Secondary | ICD-10-CM

## 2019-01-17 MED ORDER — AMITRIPTYLINE HCL 150 MG PO TABS: 150.0000 mg | ORAL_TABLET | Freq: Every day | ORAL | 0 refills | Status: DC

## 2019-01-17 MED ORDER — LURASIDONE HCL 80 MG PO TABS: 80.0000 mg | ORAL_TABLET | Freq: Every day | ORAL | 0 refills | Status: DC
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
## 2019-01-19 ENCOUNTER — Encounter (HOSPITAL_COMMUNITY): Payer: Self-pay | Admitting: Psychiatry

## 2019-01-19 ENCOUNTER — Other Ambulatory Visit: Payer: Self-pay

## 2019-01-19 ENCOUNTER — Ambulatory Visit (INDEPENDENT_AMBULATORY_CARE_PROVIDER_SITE_OTHER): Payer: Medicare HMO | Admitting: Psychiatry

## 2019-01-19 DIAGNOSIS — F319 Bipolar disorder, unspecified: Secondary | ICD-10-CM

## 2019-01-19 DIAGNOSIS — F4310 Post-traumatic stress disorder, unspecified: Secondary | ICD-10-CM

## 2019-01-19 DIAGNOSIS — F419 Anxiety disorder, unspecified: Secondary | ICD-10-CM
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
## 2019-01-20 ENCOUNTER — Telehealth (HOSPITAL_COMMUNITY): Payer: Self-pay | Admitting: Psychiatry

## 2019-01-20 NOTE — Telephone Encounter (Signed)
D:  Tiffany Odor, LCSW referred pt to MH-IOP.  A:  Placed call to orient pt and provide her with a start date, but there was no answer.  Couldn't leave a vm d/t vm not being set up.  Will attempt to call another time.  Inform [name].

## 2019-01-21 ENCOUNTER — Telehealth (HOSPITAL_COMMUNITY): Payer: Self-pay | Admitting: Psychiatry

## 2019-01-25 ENCOUNTER — Encounter (HOSPITAL_COMMUNITY): Payer: Self-pay | Admitting: Psychiatry

## 2019-01-25 ENCOUNTER — Other Ambulatory Visit: Payer: Self-pay

## 2019-01-25 ENCOUNTER — Ambulatory Visit (INDEPENDENT_AMBULATORY_CARE_PROVIDER_SITE_OTHER): Payer: Medicare HMO | Admitting: Psychiatry

## 2019-01-25 DIAGNOSIS — F4310 Post-traumatic stress disorder, unspecified: Secondary | ICD-10-CM

## 2019-01-25 DIAGNOSIS — F419 Anxiety disorder, unspecified: Secondary | ICD-10-CM | POA: Diagnosis not present

## 2019-01-25 DIAGNOSIS — F319 Bipolar disorder, unspecified: Secondary | ICD-10-CM

## 2019-01-25 NOTE — Progress Notes (Signed)
Virtual Visit via Video Note  I connected with Tiffany Reese on 01/25/19 at  1:30 PM EDT by a video enabled telemedicine application and verified that I am speaking with the correct person using two identifiers.  Location: Patient: Tiffany Reese Provider: [name], LCSW   I discussed the limitations of evaluation and management by telemedicine and the availability of in person appointments. The patient expressed understanding and agreed to proceed.  History of Present Illness: PTSD, Bipolar 1 DO, and Anxiety due to medical condition, adverse life experiences, and familial problems.    Observations/Objective: Counselor met with Tiffany Reese for individual therapy via Webex. Counselor reviewed treatment plan goals created at the intake appointment. Counselor assessed mental health concerns. Tiffany Reese reported that she has depression that comes in waves, then she bounces back to feeling normal. Counselor processed how she copes with her depression. Counselor explored her trauma history, as she wanted to share about the marital dysfunction that has led to her complex relationship with her daughter. Counselor gathered information and shared psychoeducation about secure attachments, family dynamics and development considerations that her daughter was experiencing. Counselor provided Armed forces logistics/support/administrative officer and challenges for Tiffany Reese over the next week to consider and implement.   Assessment and Plan: Counselor will continue meeting with Tiffany Reese to address treatment plan goals. Tiffany Reese will implement skills learned in session until next session.   Follow Up Instructions: Counselor will set up Webex link for next session.     I discussed the assessment and treatment plan with the patient. The patient was provided an opportunity to ask questions and all were answered. The patient agreed with the plan and demonstrated an understanding of the instructions.   The patient was advised to call back or seek an  in-person evaluation if the symptoms worsen or if the condition fails to improve as anticipated.  I provided 60 minutes of non-face-to-face time during this encounter.   [name], LCSW

## 2019-02-02 ENCOUNTER — Ambulatory Visit (INDEPENDENT_AMBULATORY_CARE_PROVIDER_SITE_OTHER): Payer: Medicare HMO | Admitting: Psychiatry

## 2019-02-02 ENCOUNTER — Ambulatory Visit (INDEPENDENT_AMBULATORY_CARE_PROVIDER_SITE_OTHER): Payer: Medicare HMO | Admitting: Family Medicine

## 2019-02-02 ENCOUNTER — Telehealth (HOSPITAL_COMMUNITY): Payer: Self-pay | Admitting: Psychiatry

## 2019-02-02 ENCOUNTER — Encounter: Payer: Self-pay | Admitting: Family Medicine

## 2019-02-02 ENCOUNTER — Other Ambulatory Visit: Payer: Self-pay

## 2019-02-02 DIAGNOSIS — E118 Type 2 diabetes mellitus with unspecified complications: Secondary | ICD-10-CM

## 2019-02-02 DIAGNOSIS — F4310 Post-traumatic stress disorder, unspecified: Secondary | ICD-10-CM | POA: Diagnosis not present

## 2019-02-02 DIAGNOSIS — F419 Anxiety disorder, unspecified: Secondary | ICD-10-CM

## 2019-02-02 DIAGNOSIS — R748 Abnormal levels of other serum enzymes: Secondary | ICD-10-CM

## 2019-02-02 DIAGNOSIS — I10 Essential (primary) hypertension: Secondary | ICD-10-CM | POA: Diagnosis not present

## 2019-02-02 DIAGNOSIS — F319 Bipolar disorder, unspecified: Secondary | ICD-10-CM | POA: Diagnosis not present

## 2019-02-02 DIAGNOSIS — R42 Dizziness and giddiness: Secondary | ICD-10-CM

## 2019-02-02 DIAGNOSIS — F339 Major depressive disorder, recurrent, unspecified: Secondary | ICD-10-CM | POA: Diagnosis not present

## 2019-02-02 DIAGNOSIS — D649 Anemia, unspecified: Secondary | ICD-10-CM

## 2019-02-02 NOTE — Progress Notes (Signed)
Virtual Visit via Video Note  I connected with Tiffany Cram Philyaw  on 02/02/19 at  2:30 PM EDT by a video enabled telemedicine application and verified that I am speaking with the correct person using two identifiers.  Location patient: home Location provider:work or home office Persons participating in the virtual visit: patient, provider  I discussed the limitations of evaluation and management by telemedicine and the availability of in person appointments. The patient expressed understanding and agreed to proceed.   Tiffany Reese DOB: 04/01/1961 Encounter date: 02/02/2019  This is a 58 y.o. female who presents with Chief Complaint  Patient presents with  . Follow-up    History of present illness: Last visit was 2 mo ago. We discussed left 4th/5th digit numbness. Worried it was cervical related. Had started with PT/OT after d/c from rehab facility due to ankle fracutre. Very worried about falling. Had significant pain at that time.   Was planning to stop vaping and working on getting health on track.   Had follow up with Dr. Sherlynn Stalls but was anxious about this. Anxiety severe.   Asked her to track blood sugars.   HPI  She is going through some depression. She has therapist as well Romelle Starcher). She is seeing pyschiatry (Dr. Adele Schilder). She states that they talked about doing group therapy but group they have is 9-12:30 daily and it is would be too expensive for her. Therapist has left message for psychiatrist to touch base on medications and consider some changes here.   She is still having a lot of problems with broken ankle. Feels this has brought her mood down some.   Hasn't been taking [place]. Blood sugar staying 115-117. Highest she has seen was 129.   No energy. Anxiety - keeps her wired and tired at the same time. Debilitating for her. Not motivated to do anything. Is able to walk - uses sleeve/ankle brace in tennis shoe. Follows up with ortho at end of month. She is  working on strength since surgery when she does PT.   Heart hurts after eating - left side chest. Not hurting now. Not sure if noting with activity. No associated SOB, no diaphoresis. Notes that she feels somewhat light headed with changes in position. No bp checking. Feels it is dropping. She states that she had cardio eval in 2016 and this was ok. Had stress testing. She had some elevated enzymes that patient states was related to stress but that heart functioning was ok.    Allergies  Allergen Reactions  . Bupropion Other (See Comments)    Causes agitation  . Duloxetine Other (See Comments)    Causes involuntary movements  . Sulfa Antibiotics   . Metformin And Related     vomiting  . Paxil [Paroxetine Hcl]     Insomnia; manic  . Prozac [Fluoxetine Hcl]     Hyperactive, insomnia  . Trintellix [Vortioxetine]     Triggers fibromyalgia  . Zoloft [Sertraline Hcl]     Hyperactivity, insomnia   Current Meds  Medication Sig  . ACCU-CHEK AVIVA PLUS test strip USE AS DIRECTED UP TO 4 TIMES A DAY  . Accu-Chek Softclix Lancets lancets USE AS DIRECTED UP TO 4 TIMES A DAY  . amitriptyline (ELAVIL) 150 MG tablet Take 1 tablet (150 mg total) by mouth at bedtime.  . blood glucose meter kit and supplies KIT Dispense based on patient and insurance preference. Use up to four times daily as directed. (FOR ICD-9 250.00, 250.01).  . cetirizine (ZYRTEC)  10 MG tablet TAKE 1 TABLET BY MOUTH EVERY DAY*PAT NEEDS APPT  . clotrimazole (LOTRIMIN) 1 % cream Apply 1 application topically 2 (two) times daily. Use on dry, clean skin. Treat for 14 days or 48 hours after rash clears.  . diazepam (VALIUM) 2 MG tablet Take 1 tablet (2 mg total) by mouth daily as needed for anxiety (severe anxiety).  Marland Kitchen levothyroxine (SYNTHROID, LEVOTHROID) 50 MCG tablet Take 1 tablet (50 mcg total) by mouth daily before breakfast.  . lurasidone (LATUDA) 80 MG TABS tablet Take 1 tablet (80 mg total) by mouth daily with breakfast.  .  metoprolol succinate (TOPROL-XL) 50 MG 24 hr tablet Take 1 tablet (50 mg total) by mouth daily. Take with or immediately following a meal.  . Multiple Vitamins-Minerals (MULTIVITAMIN ADULT PO) Take 1 tablet by mouth daily.  Marland Kitchen omeprazole (PRILOSEC) 40 MG capsule Take 1 capsule (40 mg total) by mouth daily. (Patient taking differently: Take 40 mg by mouth 2 (two) times a day. )  . pregabalin (LYRICA) 50 MG capsule Take 1 capsule (50 mg total) by mouth 2 (two) times daily.  . sitaGLIPtin (JANUVIA) 50 MG tablet Take 1 tablet (50 mg total) by mouth daily.  . [DISCONTINUED] apixaban (ELIQUIS) 5 MG TABS tablet Take 1 tablet (5 mg total) by mouth 2 (two) times daily. Take for 7 days stop on 11/22/2018  . [DISCONTINUED] atorvastatin (LIPITOR) 20 MG tablet Take 1 tablet (20 mg total) by mouth every evening.  . [DISCONTINUED] docusate sodium (COLACE) 100 MG capsule Take 1 capsule (100 mg total) by mouth 2 (two) times daily. To prevent constipation while taking pain medication.  . [DISCONTINUED] vitamin C (ASCORBIC ACID) 500 MG tablet Take 1 tablet (500 mg total) by mouth daily.    Review of Systems  Constitutional: Negative for chills, fatigue and fever.  Respiratory: Negative for cough, chest tightness, shortness of breath and wheezing.   Cardiovascular: Positive for chest pain (see hpi). Negative for palpitations and leg swelling.  Musculoskeletal: Positive for arthralgias (improving).  Neurological: Positive for light-headedness.  Psychiatric/Behavioral: Positive for decreased concentration. The patient is nervous/anxious.     Objective:  There were no vitals taken for this visit.      BP Readings from Last 3 Encounters:  11/29/18 110/68  11/25/18 128/78  11/18/18 112/71   Wt Readings from Last 3 Encounters:  11/16/18 290 lb 8 oz (131.8 kg)  11/10/18 291 lb 15.5 oz (132.4 kg)  11/05/18 281 lb 15.5 oz (127.9 kg)    EXAM:  GENERAL: alert, oriented, appears well and in no acute  distress  HEENT: atraumatic, conjunctiva clear, no obvious abnormalities on inspection of external nose and ears  NECK: normal movements of the head and neck  LUNGS: on inspection no signs of respiratory distress, breathing rate appears normal, no obvious gross SOB, gasping or wheezing  CV: no obvious cyanosis  MS: moves all visible extremities without noticeable abnormality  PSYCH/NEURO: pleasant and cooperative, she is anxious and worried about health.   Assessment/Plan 1. Essential hypertension Uncertain control of blood pressure since patient is unable to check at home.  Historically her blood pressure has been pretty low.  She was placed on metoprolol by her previous primary doctor.  I wonder if this is contributing to some orthostatic hypotension type symptoms.  I would like to bring her into the office to check her blood pressure as well as orthostatics.  Of note, she has never been able to be weightbearing during office visit with  me, so we were unable to check these in the past.  2. Controlled type 2 diabetes mellitus with complication, without long-term current use of insulin ([place]) She is not currently on any medication for blood sugar.  She states she would not mind to be on something if it will help her with weight loss overall.  Historically she had only taken Januvia.  I would like to see what her updated A1c is and discuss appropriate treatment pending that result. - Hemoglobin A1c; Future  3. Anxiety Anxiety is fairly significant.  This has been an issue for her for some time.  Encouraged her to discuss in more detail with psychiatry which she was already planning on doing.  Also encouraged her to discuss with them her concern about weight gain as change in medication will require discussion about potential side effects.  She is very pleased with her therapist and her psychiatrist and feels that she has a lot of confidence in them which is great.  4. Depression, recurrent  ([place]) See above.  Continue current medications until further advised by psychiatry.  I did ask her to have them forward note so we can stay up-to-date with her psychiatric care.  5. PTSD (post-traumatic stress disorder) See above.  6. Light headed We will have her follow-up in the office to further address this issue.  Of note, her blood work had improved in terms of anemia from previous hospitalization.  7. Elevated alkaline phosphatase level Likely secondary to surgery.  I expect this numbers to have returned to normal at this point. - Comprehensive metabolic panel; Future  8. Anemia, unspecified type Stable and improving on last check, due to ongoing lightheaded concerns, we will recheck for stability. - CBC with Differential/Platelet; Future   Return bloodwork and then in office exam when possible.  I discussed the assessment and treatment plan with the patient. The patient was provided an opportunity to ask questions and all were answered. The patient agreed with the plan and demonstrated an understanding of the instructions.   The patient was advised to call back or seek an in-person evaluation if the symptoms worsen or if the condition fails to improve as anticipated.  I provided 35 minutes of non-face-to-face time during this encounter.   Micheline Rough, MD

## 2019-02-02 NOTE — Progress Notes (Signed)
Virtual Visit via Video Note  I connected with Lolita Cram Ivey on 02/03/19 at 10:00 AM EDT by a video enabled telemedicine application and verified that I am speaking with the correct person using two identifiers.  Location: Patient: Tiffany Reese Provider: [name], LCSW   I discussed the limitations of evaluation and management by telemedicine and the availability of in person appointments. The patient expressed understanding and agreed to proceed.  History of Present Illness: PTSD, bipolar 1 disorder, depressed, and anxiety due to adverse life experiences, recent injury, and self-isolation.    Observations/Objective: Counselor met with [name] for individual therapy via Webex. Counselor assessed MH symptoms and progress on treatment plan goals. Jamyla denied suicidal ideation or self-harm behaviors.However, Ivyonna shared that she is experiencing odd and distressing dreams, depression, anxiety, lack of motivation, and full body fatigue. Counselor examined daily functioning and the history of how her depression presents itself. Counselor provided interventions and strategies Sissi could attempt to feel more motivated and productive throughout her week. Counselor and Shyonna explored her trauma history and shared psychoeducation about the impacts of trauma. Counselor and [name] discussed joining a peer group for social interactions to reduce effects of isolation. Sherice was interested.     Assessment and Plan: Counselor will continue to meet with [name] to address treatment plan goals.Counselor will refer her to IOP treatment. Caidance will continue to follow recommendations of providers and implement skills learned in session.  Follow Up Instructions: Counselor will send information for next session via Webex.     I discussed the assessment and treatment plan with the patient. The patient was provided an opportunity to ask questions and all were answered. The patient agreed with the plan and  demonstrated an understanding of the instructions.   The patient was advised to call back or seek an in-person evaluation if the symptoms worsen or if the condition fails to improve as anticipated.  I provided 55 minutes of non-face-to-face time during this encounter.   [name], LCSW

## 2019-02-02 NOTE — Telephone Encounter (Signed)
D:  Pt's therapist Hilbert [place], [place]) requested that Clinical research associate attempt to contact pt again, but at another number [phone]).  A:  Contacted pt to orient her and provide her with a start date.  Pt is declining MH-IOP at this time d/t financial constraints.  "I didn't know that the group was everyday." Pt states she will think about if she will attend or not.  Writer discussed the option of [facility] [place] [facility] and provided pt with their number, if she decides not to do MH-IOP.  Inform [name].  R:  Pt receptive.

## 2019-02-03 ENCOUNTER — Encounter (HOSPITAL_COMMUNITY): Payer: Self-pay | Admitting: Psychiatry

## 2019-02-03 ENCOUNTER — Other Ambulatory Visit (HOSPITAL_COMMUNITY): Payer: Self-pay

## 2019-02-03 DIAGNOSIS — F419 Anxiety disorder, unspecified: Secondary | ICD-10-CM

## 2019-02-03 MED ORDER — DIAZEPAM 2 MG PO TABS: 2.0000 mg | ORAL_TABLET | Freq: Every day | ORAL | 0 refills | Status: DC | PRN

## 2019-02-07 ENCOUNTER — Other Ambulatory Visit (INDEPENDENT_AMBULATORY_CARE_PROVIDER_SITE_OTHER): Payer: Medicare HMO

## 2019-02-07 ENCOUNTER — Other Ambulatory Visit: Payer: Self-pay

## 2019-02-07 DIAGNOSIS — R748 Abnormal levels of other serum enzymes: Secondary | ICD-10-CM | POA: Diagnosis not present

## 2019-02-07 DIAGNOSIS — D649 Anemia, unspecified: Secondary | ICD-10-CM

## 2019-02-07 DIAGNOSIS — E118 Type 2 diabetes mellitus with unspecified complications: Secondary | ICD-10-CM | POA: Diagnosis not present

## 2019-02-07 LAB — CBC WITH DIFFERENTIAL/PLATELET
Basophils Absolute: 0.1 10*3/uL (ref 0.0–0.1)
Basophils Relative: 1.1 % (ref 0.0–3.0)
Eosinophils Absolute: 0.1 10*3/uL (ref 0.0–0.7)
Eosinophils Relative: 1.3 % (ref 0.0–5.0)
HCT: 37.3 % (ref 36.0–46.0)
Hemoglobin: 12.4 g/dL (ref 12.0–15.0)
Lymphocytes Relative: 23 % (ref 12.0–46.0)
Lymphs Abs: 2.1 10*3/uL (ref 0.7–4.0)
MCHC: 33.3 g/dL (ref 30.0–36.0)
MCV: 85.2 fl (ref 78.0–100.0)
Monocytes Absolute: 0.5 10*3/uL (ref 0.1–1.0)
Monocytes Relative: 5.2 % (ref 3.0–12.0)
Neutro Abs: 6.3 10*3/uL (ref 1.4–7.7)
Neutrophils Relative %: 69.4 % (ref 43.0–77.0)
Platelets: 251 10*3/uL (ref 150.0–400.0)
RBC: 4.38 Mil/uL (ref 3.87–5.11)
RDW: 17.3 % — ABNORMAL HIGH (ref 11.5–15.5)
WBC: 9.1 10*3/uL (ref 4.0–10.5)

## 2019-02-07 LAB — COMPREHENSIVE METABOLIC PANEL WITH GFR
ALT: 47 U/L — ABNORMAL HIGH (ref 0–35)
AST: 33 U/L (ref 0–37)
Albumin: 4.4 g/dL (ref 3.5–5.2)
Alkaline Phosphatase: 197 U/L — ABNORMAL HIGH (ref 39–117)
BUN: 15 mg/dL (ref 6–23)
CO2: 25 meq/L (ref 19–32)
Calcium: 9.8 mg/dL (ref 8.4–10.5)
Chloride: 102 meq/L (ref 96–112)
Creatinine, Ser: 0.98 mg/dL (ref 0.40–1.20)
GFR: 58.78 mL/min — ABNORMAL LOW
Glucose, Bld: 96 mg/dL (ref 70–99)
Potassium: 4.1 meq/L (ref 3.5–5.1)
Sodium: 139 meq/L (ref 135–145)
Total Bilirubin: 0.3 mg/dL (ref 0.2–1.2)
Total Protein: 7.5 g/dL (ref 6.0–8.3)

## 2019-02-07 LAB — HEMOGLOBIN A1C: Hgb A1c MFr Bld: 6.4 % (ref 4.6–6.5)

## 2019-02-08 ENCOUNTER — Encounter (HOSPITAL_COMMUNITY): Payer: Self-pay | Admitting: Psychiatry

## 2019-02-08 ENCOUNTER — Other Ambulatory Visit: Payer: Self-pay

## 2019-02-08 ENCOUNTER — Ambulatory Visit (INDEPENDENT_AMBULATORY_CARE_PROVIDER_SITE_OTHER): Payer: Medicare HMO | Admitting: Psychiatry

## 2019-02-08 DIAGNOSIS — F419 Anxiety disorder, unspecified: Secondary | ICD-10-CM | POA: Diagnosis not present

## 2019-02-08 DIAGNOSIS — F4310 Post-traumatic stress disorder, unspecified: Secondary | ICD-10-CM | POA: Diagnosis not present

## 2019-02-08 DIAGNOSIS — F319 Bipolar disorder, unspecified: Secondary | ICD-10-CM

## 2019-02-08 NOTE — Progress Notes (Signed)
Virtual Visit via Video Note  I connected with Tiffany Reese on 02/08/19 at 12:30 PM EDT by a video enabled telemedicine application and verified that I am speaking with the correct person using two identifiers.  Location: Patient: Tiffany Reese Provider: [name], LCSW   I discussed the limitations of evaluation and management by telemedicine and the availability of in person appointments. The patient expressed understanding and agreed to proceed.  History of Present Illness: Anxiety, PTSD and Bipolar 1 disorder, depressed mood due to adverse life experiences, self-isolation and complex medical conditions.    Observations/Objective: Counselor met with Tiffany Reese for individual therapy via Webex. Counselor assessed MH symptoms and progress on treatment plan goals. Tiffany Reese denied suicidal ideation or self-harm behaviors. Tiffany Reese reports continuous depression and anxiety that she describes as "debilitating". Her sleep, hygiene, social interactions, productivity and self-worth are all being effected. Counselor explored daily functioning and we discussed routine changes to improve mental health. Tiffany Reese shared that she has sensory "overload" when taking showers and would like to explore the root of that more. She referred to herself as "broken" several times in conversation and we unpacked what that meant for her. Counselor encouraged her to get connected with [facility] to get a one on one peer mentor, link up with the support groups and the training series they have. Counselor and Tiffany Reese made a list of concerns for her to discuss at an upcoming appointment with her doctors. Counselor provided CBT/DBT skills to combat the anxious thoughts. Tiffany Reese stated she would be willing to give them a try.   Assessment and Plan: Counselor will continue to meet with Tiffany Reese to address treatment plan goals. Tiffany Reese will continue to follow recommendations of providers and implement skills learned in  session.  Follow Up Instructions: Counselor will send information for next session via Webex.     I discussed the assessment and treatment plan with the patient. The patient was provided an opportunity to ask questions and all were answered. The patient agreed with the plan and demonstrated an understanding of the instructions.   The patient was advised to call back or seek an in-person evaluation if the symptoms worsen or if the condition fails to improve as anticipated.  I provided 60 minutes of non-face-to-face time during this encounter.   [name], LCSW

## 2019-02-15 ENCOUNTER — Encounter (HOSPITAL_COMMUNITY): Payer: Self-pay | Admitting: Psychiatry

## 2019-02-15 ENCOUNTER — Ambulatory Visit (INDEPENDENT_AMBULATORY_CARE_PROVIDER_SITE_OTHER): Payer: Medicare HMO | Admitting: Psychiatry

## 2019-02-15 ENCOUNTER — Other Ambulatory Visit: Payer: Self-pay

## 2019-02-15 DIAGNOSIS — F419 Anxiety disorder, unspecified: Secondary | ICD-10-CM

## 2019-02-15 DIAGNOSIS — F319 Bipolar disorder, unspecified: Secondary | ICD-10-CM

## 2019-02-15 DIAGNOSIS — F4310 Post-traumatic stress disorder, unspecified: Secondary | ICD-10-CM

## 2019-02-15 MED ORDER — AMITRIPTYLINE HCL 150 MG PO TABS: 150.0000 mg | ORAL_TABLET | Freq: Every day | ORAL | 0 refills | Status: DC

## 2019-02-15 MED ORDER — DIAZEPAM 2 MG PO TABS: 2.0000 mg | ORAL_TABLET | Freq: Two times a day (BID) | ORAL | 0 refills | Status: DC | PRN

## 2019-02-15 MED ORDER — LURASIDONE HCL 80 MG PO TABS: 80.0000 mg | ORAL_TABLET | Freq: Every day | ORAL | 0 refills | Status: DC
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
## 2019-02-16 ENCOUNTER — Ambulatory Visit (HOSPITAL_COMMUNITY): Payer: Medicare HMO | Admitting: Psychiatry

## 2019-02-16 ENCOUNTER — Ambulatory Visit (INDEPENDENT_AMBULATORY_CARE_PROVIDER_SITE_OTHER): Payer: Medicare HMO | Admitting: Family Medicine

## 2019-02-16 ENCOUNTER — Other Ambulatory Visit: Payer: Self-pay

## 2019-02-16 ENCOUNTER — Encounter: Payer: Self-pay | Admitting: Family Medicine

## 2019-02-16 VITALS — Temp 97.7°F | Ht 66.0 in | Wt 283.6 lb

## 2019-02-16 DIAGNOSIS — I950 Idiopathic hypotension: Secondary | ICD-10-CM

## 2019-02-16 DIAGNOSIS — M25572 Pain in left ankle and joints of left foot: Secondary | ICD-10-CM

## 2019-02-16 MED ORDER — BLOOD PRESSURE MONITOR/L CUFF MISC: 1.0000 | Freq: Every day | 0 refills | Status: AC

## 2019-02-16 MED ORDER — DICLOFENAC SODIUM 75 MG PO TBEC: 75.0000 mg | DELAYED_RELEASE_TABLET | Freq: Two times a day (BID) | ORAL | 2 refills | Status: DC | PRN

## 2019-02-16 NOTE — Patient Instructions (Addendum)
Consider [employer] for shoe search - www.[employer].com  Stop metoprolol. Check blood pressure daily at home. Report back to me in 2 weeks time.   Work on eating healthy - eating to fuel your body! Remember what you put in helps with what you get out.   Consider voltaren gel topical. Trial of voltaren instead of ibuprofen. Let me know how this does. Make sure to take with food and do not take other anti-inflammatories with this.   Great job with quitting vaping!

## 2019-02-16 NOTE — Progress Notes (Signed)
Tiffany Reese DOB: 1961-02-18 Encounter date: 02/16/2019  This is a 58 y.o. female who presents with No chief complaint on file.   History of present illness:  HPI  Sometimes with standing feels like she is going to buckle/collapse. Does feel woozy/light headed sensation on regular basis; but worse with changes in position. Happens at least daily. Notes most with standing up.   Sugars were less than 120 until she could start driving and get her own fast food. Highest sugar she has seen was 160. (saw this one time) others were 130-140; today was 126.   Ibuprofen is not helping with ankle pain at all. States that pain can be severe. She is no longer taking any pain medication.     Allergies  Allergen Reactions  . Bupropion Other (See Comments)    Causes agitation  . Duloxetine Other (See Comments)    Causes involuntary movements  . Sulfa Antibiotics   . Metformin And Related     vomiting  . Paxil [Paroxetine Hcl]     Insomnia; manic  . Prozac [Fluoxetine Hcl]     Hyperactive, insomnia  . Trintellix [Vortioxetine]     Triggers fibromyalgia  . Zoloft [Sertraline Hcl]     Hyperactivity, insomnia   Current Meds  Medication Sig  . ACCU-CHEK AVIVA PLUS test strip USE AS DIRECTED UP TO 4 TIMES A DAY  . Accu-Chek Softclix Lancets lancets USE AS DIRECTED UP TO 4 TIMES A DAY  . amitriptyline (ELAVIL) 150 MG tablet Take 1 tablet (150 mg total) by mouth at bedtime.  . blood glucose meter kit and supplies KIT Dispense based on patient and insurance preference. Use up to four times daily as directed. (FOR ICD-9 250.00, 250.01).  . cetirizine (ZYRTEC) 10 MG tablet TAKE 1 TABLET BY MOUTH EVERY DAY*PAT NEEDS APPT  . clotrimazole (LOTRIMIN) 1 % cream Apply 1 application topically 2 (two) times daily. Use on dry, clean skin. Treat for 14 days or 48 hours after rash clears.  . diazepam (VALIUM) 2 MG tablet Take 1 tablet (2 mg total) by mouth 2 (two) times daily as needed for anxiety  (severe anxiety).  Marland Kitchen levothyroxine (SYNTHROID, LEVOTHROID) 50 MCG tablet Take 1 tablet (50 mcg total) by mouth daily before breakfast.  . lurasidone (LATUDA) 80 MG TABS tablet Take 1 tablet (80 mg total) by mouth daily with breakfast.  . metoprolol succinate (TOPROL-XL) 50 MG 24 hr tablet Take 1 tablet (50 mg total) by mouth daily. Take with or immediately following a meal.  . Multiple Vitamins-Minerals (MULTIVITAMIN ADULT PO) Take 1 tablet by mouth daily.  Marland Kitchen omeprazole (PRILOSEC) 40 MG capsule Take 1 capsule (40 mg total) by mouth daily. (Patient taking differently: Take 40 mg by mouth 2 (two) times a day. )  . pregabalin (LYRICA) 50 MG capsule Take 1 capsule (50 mg total) by mouth 2 (two) times daily.  . sitaGLIPtin (JANUVIA) 50 MG tablet Take 1 tablet (50 mg total) by mouth daily.    Review of Systems  Constitutional: Negative for chills, fatigue and fever.  Respiratory: Negative for cough, chest tightness, shortness of breath and wheezing.   Cardiovascular: Negative for chest pain, palpitations and leg swelling.  Musculoskeletal: Positive for arthralgias.    Objective:  Temp 97.7 F (36.5 C) (Oral)   Ht '5\' 6"'  (1.676 m)   Wt 283 lb 9.6 oz (128.6 kg)   BMI 45.77 kg/m   Weight: 283 lb 9.6 oz (128.6 kg)   BP Readings from Last  3 Encounters:  11/29/18 110/68  11/25/18 128/78  11/18/18 112/71   Wt Readings from Last 3 Encounters:  02/16/19 283 lb 9.6 oz (128.6 kg)  11/16/18 290 lb 8 oz (131.8 kg)  11/10/18 291 lb 15.5 oz (132.4 kg)    Physical Exam Constitutional:      General: She is not in acute distress.    Appearance: She is well-developed.  Cardiovascular:     Rate and Rhythm: Normal rate and regular rhythm.     Heart sounds: Normal heart sounds. No murmur. No friction rub.  Pulmonary:     Effort: Pulmonary effort is normal. No respiratory distress.     Breath sounds: Normal breath sounds. No wheezing or rales.  Musculoskeletal:     Right lower leg: No edema.      Left lower leg: No edema.  Neurological:     Mental Status: She is alert and oriented to person, place, and time.  Psychiatric:        Behavior: Behavior normal.     Assessment/Plan  1. Idiopathic hypotension Stop metoprolol. Monitor bp at home. Suspect some of weakness and light headedness is related to low pressures.  - Blood Pressure Monitoring (BLOOD PRESSURE MONITOR/L CUFF) MISC; 1 Device by Does not apply route daily.  Dispense: 1 each; Refill: 0  2. Acute left ankle pain Trial of voltaren. If not helping with pain, recommend follow up with ortho for reassessment/pain control discussion. Topical voltaren gel recommended as well for pain control.  - diclofenac (VOLTAREN) 75 MG EC tablet; Take 1 tablet (75 mg total) by mouth 2 (two) times daily as needed for moderate pain.  Dispense: 60 tablet; Refill: 2    Return in about 2 weeks (around 03/02/2019), or doxy visit for bp follow up.   Plan for A1C repeat in August. Will hold off on dm med until then.    Micheline Rough, MD

## 2019-02-18 ENCOUNTER — Ambulatory Visit (INDEPENDENT_AMBULATORY_CARE_PROVIDER_SITE_OTHER): Payer: Medicare HMO | Admitting: Psychiatry

## 2019-02-18 ENCOUNTER — Other Ambulatory Visit: Payer: Self-pay

## 2019-02-18 ENCOUNTER — Encounter (HOSPITAL_COMMUNITY): Payer: Self-pay | Admitting: Psychiatry

## 2019-02-18 DIAGNOSIS — F419 Anxiety disorder, unspecified: Secondary | ICD-10-CM | POA: Diagnosis not present

## 2019-02-18 DIAGNOSIS — F4310 Post-traumatic stress disorder, unspecified: Secondary | ICD-10-CM | POA: Diagnosis not present

## 2019-02-18 DIAGNOSIS — F319 Bipolar disorder, unspecified: Secondary | ICD-10-CM | POA: Diagnosis not present
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
## 2019-02-25 ENCOUNTER — Ambulatory Visit (HOSPITAL_COMMUNITY): Payer: Medicare HMO | Admitting: Psychiatry

## 2019-02-25 ENCOUNTER — Other Ambulatory Visit: Payer: Self-pay

## 2019-02-25 DIAGNOSIS — F319 Bipolar disorder, unspecified: Secondary | ICD-10-CM

## 2019-02-25 DIAGNOSIS — F4310 Post-traumatic stress disorder, unspecified: Secondary | ICD-10-CM

## 2019-02-25 DIAGNOSIS — F419 Anxiety disorder, unspecified: Secondary | ICD-10-CM

## 2019-02-25 NOTE — Progress Notes (Signed)
Virtual Visit via Video Note  I connected with Tiffany Reese on 02/25/19 at  9:00 AM EDT by a video enabled telemedicine application and verified that I am speaking with the correct person using two identifiers.  Location: Patient: Tiffany Reese Provider: [name], LCSW   I discussed the limitations of evaluation and management by telemedicine and the availability of in person appointments. The patient expressed understanding and agreed to proceed.  History of Present Illness: Bipolar 1 disorder, depressed, Anxiety and PTSD due to adverse life experiences.    Observations/Objective: Counselor met with Tiffany Reese for individual therapy via Webex. Counselor assessed MH symptoms and progress on treatment plan goals. Gerilyn denied suicidal ideation or self-harm behaviors. Calin shared that she was experiencing extreme anxiety that felt debilitating. Laresa also described her depression as constant and closely related to her anxiety symptoms. Counselor unpacked these statements and provided psychoeducation on combating anxiety and depression. We explored her current medication effects, discussed trauma related topics and ways to increase her support system and treatment. Counselor provided resources for her to contact to gain more support. We determined that it would be helpful to start working from childhood to present day in going through her trauma experiences, so be able to process thoughts and feelings she was never able to then in a healthy manner. Counselor supported Tiffany Reese in making the initial phone call to [place] [facility] [place].   Assessment and Plan: Counselor will continue to meet with Tiffany Reese to address treatment plan goals. Davena will continue to follow recommendations of providers and implement skills learned in session.  Follow Up Instructions: Counselor will send information for next session via Webex.     I discussed the assessment and treatment plan with the patient. The patient was  provided an opportunity to ask questions and all were answered. The patient agreed with the plan and demonstrated an understanding of the instructions.   The patient was advised to call back or seek an in-person evaluation if the symptoms worsen or if the condition fails to improve as anticipated.  I provided 45 minutes of non-face-to-face time during this encounter.   [name], LCSW

## 2019-03-08 ENCOUNTER — Ambulatory Visit (INDEPENDENT_AMBULATORY_CARE_PROVIDER_SITE_OTHER): Payer: Medicare HMO | Admitting: Psychiatry

## 2019-03-08 ENCOUNTER — Other Ambulatory Visit: Payer: Self-pay

## 2019-03-08 ENCOUNTER — Ambulatory Visit: Payer: Medicare HMO | Admitting: *Deleted

## 2019-03-08 DIAGNOSIS — F419 Anxiety disorder, unspecified: Secondary | ICD-10-CM | POA: Diagnosis not present

## 2019-03-08 DIAGNOSIS — F319 Bipolar disorder, unspecified: Secondary | ICD-10-CM

## 2019-03-08 DIAGNOSIS — F4310 Post-traumatic stress disorder, unspecified: Secondary | ICD-10-CM

## 2019-03-09 ENCOUNTER — Encounter (HOSPITAL_COMMUNITY): Payer: Self-pay | Admitting: Psychiatry

## 2019-03-09 NOTE — Progress Notes (Signed)
Virtual Visit via Video Note  I connected with Tiffany Reese on 03/09/19 at  1:00 PM EDT by a video enabled telemedicine application and verified that I am speaking with the correct person using two identifiers.  Location: Patient: Tiffany Reese Provider: [name], LCSW   I discussed the limitations of evaluation and management by telemedicine and the availability of in person appointments. The patient expressed understanding and agreed to proceed.  History of Present Illness: Bipolar 1, Anxiety and PTSD   Observations/Objective: Counselor met with Tiffany Reese for individual therapy via Webex. Counselor assessed MH symptoms and progress on treatment plan goals. Tiffany Reese denied suicidal ideation or self-harm behaviors. Tiffany Reese shared that her anxiety and depression have moved from mild to moderate and severe at times. Counselor assessed daily functioning. Tiffany Reese reports being crippled by anxiety and depression, staying in her recliner most days, engaging in automatic negative thoughts about her past-guilt, blame and shame. Counselor followed up on her engaging with wrap around services through [facility] and a [place] group. We discussed alternative treatments-TMS, ECT, EMDR and ACT to more intensely address her chronic and worsening depression. She shared about the impact of recent change in medications. Counselor will communicate with Psychiatrist about concerns and make a plan for moving forward and getting her engaged in other levels of service. Tiffany Reese felt good about trying alternative treatments, anything to improve her condition.   Assessment and Plan: Counselor will continue to meet with Tiffany Reese to address treatment plan goals. Tiffany Reese will continue to follow recommendations of providers and implement skills learned in session. Dr will communicate recommendations for additional treatments.   Follow Up Instructions: Counselor will send information for next session via Webex.   I provided 55  minutes of non-face-to-face time during this encounter.   [name], LCSW

## 2019-03-16 ENCOUNTER — Other Ambulatory Visit: Payer: Self-pay

## 2019-03-16 ENCOUNTER — Encounter (HOSPITAL_COMMUNITY): Payer: Self-pay | Admitting: Psychiatry

## 2019-03-16 ENCOUNTER — Ambulatory Visit (INDEPENDENT_AMBULATORY_CARE_PROVIDER_SITE_OTHER): Payer: Medicare HMO | Admitting: Psychiatry

## 2019-03-16 DIAGNOSIS — F319 Bipolar disorder, unspecified: Secondary | ICD-10-CM

## 2019-03-16 DIAGNOSIS — F4310 Post-traumatic stress disorder, unspecified: Secondary | ICD-10-CM | POA: Diagnosis not present

## 2019-03-16 DIAGNOSIS — F419 Anxiety disorder, unspecified: Secondary | ICD-10-CM

## 2019-03-16 MED ORDER — LAMOTRIGINE 25 MG PO TABS: ORAL_TABLET | ORAL | 1 refills | Status: DC

## 2019-03-16 MED ORDER — LURASIDONE HCL 80 MG PO TABS: 80.0000 mg | ORAL_TABLET | Freq: Every day | ORAL | 1 refills | Status: DC

## 2019-03-16 MED ORDER — DIAZEPAM 2 MG PO TABS: 2.0000 mg | ORAL_TABLET | Freq: Three times a day (TID) | ORAL | 0 refills | Status: DC | PRN
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
## 2019-03-22 ENCOUNTER — Ambulatory Visit (INDEPENDENT_AMBULATORY_CARE_PROVIDER_SITE_OTHER): Payer: Medicare HMO | Admitting: Psychiatry

## 2019-03-22 ENCOUNTER — Encounter (HOSPITAL_COMMUNITY): Payer: Self-pay | Admitting: Psychiatry

## 2019-03-22 ENCOUNTER — Other Ambulatory Visit: Payer: Self-pay

## 2019-03-22 DIAGNOSIS — F319 Bipolar disorder, unspecified: Secondary | ICD-10-CM | POA: Diagnosis not present

## 2019-03-22 DIAGNOSIS — F419 Anxiety disorder, unspecified: Secondary | ICD-10-CM

## 2019-03-22 DIAGNOSIS — F4310 Post-traumatic stress disorder, unspecified: Secondary | ICD-10-CM | POA: Diagnosis not present

## 2019-03-22 NOTE — Progress Notes (Signed)
Virtual Visit via Video Note  I connected with Lolita Cram Supinski on 03/22/19 at  2:00 PM EDT by a video enabled telemedicine application and verified that I am speaking with the correct person using two identifiers.  Location: Patient: Tiffany Reese Provider: [name], LCSW   I discussed the limitations of evaluation and management by telemedicine and the availability of in person appointments. The patient expressed understanding and agreed to proceed.  History of Present Illness: Bipolar 1, Anxiety and PTSD   Observations/Objective: Counselor met with [name] for individual therapy via Webex. Counselor assessed MH symptoms and progress on treatment plan goals. Zea denied suicidal ideation or self-harm behaviors. Aylyn shared that she was able to complete some tasks today, but that overall she is still feeling unproductive, trapped in anxiety and depression. She stated that she had some med changes last week at her appointment and that she has a consultation with Dr. Dwyane Dee about [place] treatment this week. Kyrstyn expressed excitement and hopefulness about the treatment. Counselor explored daily functioning and we walked through her fear of going to the pool and to her mental health support group step by step to reduce anxiety and create an action plan. Counselor challenged distorted thinking. Counselor and [name] processed how her mental health is impacting relationships that she values. Counselor encouraged Kimori to put action plans into place. She stated she would "try to try to do it."  Assessment and Plan: Counselor will continue to meet with [name] to address treatment plan goals. Jaylyn will continue to follow recommendations of providers and implement skills learned in session.  Follow Up Instructions: Counselor will send information for next session via Webex.     I discussed the assessment and treatment plan with the patient. The patient was provided an opportunity to ask questions  and all were answered. The patient agreed with the plan and demonstrated an understanding of the instructions.   The patient was advised to call back or seek an in-person evaluation if the symptoms worsen or if the condition fails to improve as anticipated.  I provided 60 minutes of non-face-to-face time during this encounter.   [name], LCSW

## 2019-03-23 DIAGNOSIS — S82852D Displaced trimalleolar fracture of left lower leg, subsequent encounter for closed fracture with routine healing: Secondary | ICD-10-CM | POA: Diagnosis not present

## 2019-03-23 DIAGNOSIS — M25672 Stiffness of left ankle, not elsewhere classified: Secondary | ICD-10-CM | POA: Diagnosis not present

## 2019-03-23 DIAGNOSIS — R262 Difficulty in walking, not elsewhere classified: Secondary | ICD-10-CM | POA: Diagnosis not present

## 2019-03-23 DIAGNOSIS — M6281 Muscle weakness (generalized): Secondary | ICD-10-CM | POA: Diagnosis not present

## 2019-03-24 ENCOUNTER — Ambulatory Visit (INDEPENDENT_AMBULATORY_CARE_PROVIDER_SITE_OTHER): Payer: Medicare HMO | Admitting: Psychiatry

## 2019-03-24 ENCOUNTER — Other Ambulatory Visit: Payer: Self-pay

## 2019-03-24 ENCOUNTER — Encounter (HOSPITAL_COMMUNITY): Payer: Self-pay | Admitting: Psychiatry

## 2019-03-24 VITALS — BP 138/80 | HR 97 | Temp 98.5°F | Ht 66.0 in | Wt 297.0 lb

## 2019-03-24 DIAGNOSIS — F339 Major depressive disorder, recurrent, unspecified: Secondary | ICD-10-CM

## 2019-03-24 DIAGNOSIS — F4310 Post-traumatic stress disorder, unspecified: Secondary | ICD-10-CM
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
## 2019-03-30 ENCOUNTER — Other Ambulatory Visit: Payer: Self-pay

## 2019-03-30 ENCOUNTER — Encounter: Payer: Self-pay | Admitting: Family Medicine

## 2019-03-30 ENCOUNTER — Ambulatory Visit (INDEPENDENT_AMBULATORY_CARE_PROVIDER_SITE_OTHER): Payer: Medicare HMO | Admitting: Family Medicine

## 2019-03-30 DIAGNOSIS — E039 Hypothyroidism, unspecified: Secondary | ICD-10-CM | POA: Diagnosis not present

## 2019-03-30 DIAGNOSIS — F419 Anxiety disorder, unspecified: Secondary | ICD-10-CM

## 2019-03-30 DIAGNOSIS — F339 Major depressive disorder, recurrent, unspecified: Secondary | ICD-10-CM | POA: Diagnosis not present

## 2019-03-30 DIAGNOSIS — F4310 Post-traumatic stress disorder, unspecified: Secondary | ICD-10-CM

## 2019-03-30 DIAGNOSIS — M545 Low back pain, unspecified: Secondary | ICD-10-CM

## 2019-03-30 DIAGNOSIS — I10 Essential (primary) hypertension: Secondary | ICD-10-CM | POA: Diagnosis not present

## 2019-03-30 DIAGNOSIS — E118 Type 2 diabetes mellitus with unspecified complications: Secondary | ICD-10-CM

## 2019-03-30 DIAGNOSIS — M25511 Pain in right shoulder: Secondary | ICD-10-CM | POA: Diagnosis not present

## 2019-03-30 DIAGNOSIS — M5135 Other intervertebral disc degeneration, thoracolumbar region: Secondary | ICD-10-CM | POA: Diagnosis not present

## 2019-03-30 NOTE — Progress Notes (Signed)
Virtual Visit via Video Note  I connected with Tiffany Reese  on 03/30/19 at  1:30 PM EDT by a video enabled telemedicine application and verified that I am speaking with the correct person using two identifiers.  Location patient: home Location provider:work or home office Persons participating in the virtual visit: patient, provider  I discussed the limitations of evaluation and management by telemedicine and the availability of in person appointments. The patient expressed understanding and agreed to proceed.   Tiffany Reese DOB: 15-Nov-1960 Encounter date: 03/30/2019  This is a 58 y.o. female who presents with Chief Complaint  Patient presents with  . Follow-up    History of present illness: Not doing well; she is in deep depression. She is going to try [place]. They are in process of trying to get this approved through insurance. Has tried multiple medications, but hasn't done great with any medications. Feels worse on medications sometimes, but having to keep trying things to try and get better mood treatment. On latuda and lamictal now but feels like these are dragging her down.   Has had some swelling. Lasix helps with this.   Gets flu like symptoms, aching with fibro. Lyrica is very helpful for her. Can't keep legs still and feels like pins and needles are going in feet/legs.   Diet is poor. Has a hard time focusing on this. Very tired. Doesn't feel like doing anything. Diet is not well rounded.   Has been checking blood pressure. Not checking blood sugars. 135/82 hr 90. BP has been as high as 148/93. Usually eats breakfast and supper. Both are fast food/restaurant. Usually breakfast around 11; snacking on fruit. Does have supper. Has no motivation to make own food. Dizziness is better. Feels better from this standpoint.   Lower back bothering her. Can't even get through store before she has to rest. Shooting pain on right side. If she can't sit down it just keeps getting  worse. Bending forward helps ease pain. Thinks it is related to weight. Has not had injections in back but was in pain clinic for this. Has hurt back in past. Has had multiple falls of horses. Broken tailbone. Has cervical spine issues. Has problems all the way down her back. Pain clinic previously did knee injections for her but they were doing pain meds for back. Had MRI through pain clinic (we do not have these records); surgery was never suggested for her.   She is still having issues with ankle. Hasn't healed as she expected. Doesn't walk normal. Feels like she is "slanted". Ankle is really hurting by time she is only part way through grocery shopping. She does try to do range of motion exercises daily.  Right shoulder/rotator cuff also bothering her significantly. Can't even do ROM exercises with this.   Sits in reclining chair for most of day. Not doing regular activities that she should do - not taking trash out, not caring for home. Does care for pet. Not staying as clean as she normally would.   Not having thoughts of hurting self. Just wants to have a happy normal life.    Allergies  Allergen Reactions  . Bupropion Other (See Comments)    Causes agitation  . Duloxetine Other (See Comments)    Causes involuntary movements  . Sulfa Antibiotics   . Metformin And Related     vomiting  . Paxil [Paroxetine Hcl]     Insomnia; manic  . Prozac [Fluoxetine Hcl]     Hyperactive,  insomnia  . Trintellix [Vortioxetine]     Triggers fibromyalgia  . Zoloft [Sertraline Hcl]     Hyperactivity, insomnia   Current Meds  Medication Sig  . ACCU-CHEK AVIVA PLUS test strip USE AS DIRECTED UP TO 4 TIMES A DAY  . Accu-Chek Softclix Lancets lancets USE AS DIRECTED UP TO 4 TIMES A DAY  . amitriptyline (ELAVIL) 150 MG tablet Take 1 tablet (150 mg total) by mouth at bedtime.  . blood glucose meter kit and supplies KIT Dispense based on patient and insurance preference. Use up to four times daily as  directed. (FOR ICD-9 250.00, 250.01).  . Blood Pressure Monitoring (BLOOD PRESSURE MONITOR/L CUFF) MISC 1 Device by Does not apply route daily.  . cetirizine (ZYRTEC) 10 MG tablet TAKE 1 TABLET BY MOUTH EVERY DAY*PAT NEEDS APPT  . clotrimazole (LOTRIMIN) 1 % cream Apply 1 application topically 2 (two) times daily. Use on dry, clean skin. Treat for 14 days or 48 hours after rash clears.  . diazepam (VALIUM) 2 MG tablet Take 1 tablet (2 mg total) by mouth 3 (three) times daily as needed for anxiety (severe anxiety).  Marland Kitchen diclofenac (VOLTAREN) 75 MG EC tablet Take 1 tablet (75 mg total) by mouth 2 (two) times daily as needed for moderate pain.  . furosemide (LASIX) 20 MG tablet Take 20 mg by mouth daily.  Marland Kitchen lamoTRIgine (LAMICTAL) 25 MG tablet Take one tab daily for one week and than two tab daily (Patient taking differently: Take 50 mg by mouth daily. )  . levothyroxine (SYNTHROID, LEVOTHROID) 50 MCG tablet Take 1 tablet (50 mcg total) by mouth daily before breakfast.  . lurasidone (LATUDA) 80 MG TABS tablet Take 1 tablet (80 mg total) by mouth daily with breakfast.  . Multiple Vitamins-Minerals (MULTIVITAMIN ADULT PO) Take 1 tablet by mouth daily.  Marland Kitchen omeprazole (PRILOSEC) 40 MG capsule Take 1 capsule (40 mg total) by mouth daily. (Patient taking differently: Take 40 mg by mouth 2 (two) times a day. )  . pregabalin (LYRICA) 50 MG capsule Take 1 capsule (50 mg total) by mouth 2 (two) times daily.  . [DISCONTINUED] metoprolol succinate (TOPROL-XL) 50 MG 24 hr tablet Take 1 tablet (50 mg total) by mouth daily. Take with or immediately following a meal.  . [DISCONTINUED] sitaGLIPtin (JANUVIA) 50 MG tablet Take 1 tablet (50 mg total) by mouth daily.    Review of Systems  Constitutional: Negative for chills, fatigue and fever.  Respiratory: Negative for cough, chest tightness, shortness of breath and wheezing.   Cardiovascular: Negative for chest pain, palpitations and leg swelling.  Musculoskeletal:  Positive for arthralgias, back pain, gait problem, joint swelling and neck pain.  Psychiatric/Behavioral: Positive for decreased concentration. Negative for suicidal ideas.       Mood is very depressed. Lacks motivation to accomplish basic daily activities. Spends most of day in recliner chair wrapped in blanket "cocoon".     Objective:  There were no vitals taken for this visit.      BP Readings from Last 3 Encounters:  11/29/18 110/68  11/25/18 128/78  11/18/18 112/71   Wt Readings from Last 3 Encounters:  02/16/19 283 lb 9.6 oz (128.6 kg)  11/16/18 290 lb 8 oz (131.8 kg)  11/10/18 291 lb 15.5 oz (132.4 kg)    EXAM: *Video feed not working; completed visit with audio only.  GENERAL: alert, oriented, but sounds depressed  LUNGS: no difficulty with breathing.  PSYCH/NEURO: mood is depressed. She is sleeping excessively, immobile throughout day.  Mood has impacted all other aspects of health - not cooking/prepping food. Making poor food choices. Has missed rehab session with ankle. Back pain also contributes to poor mood since she is not able to complete tasks like shopping due to pain. Feels like she does not have good support system. No thoughts of hurting self. Just wants to feel better.   Assessment/Plan  1. Lumbar back pain Seeing pain management for this.  I do not have previous records for review.  She has not had injections before, but from her report she seemed to have multiple areas causing pain throughout the back.  I am going to refer her to [facility] to address pain.  If appointment takes longer than 1 month for her to get an, we can help facilitate imaging to move process along.  - Ambulatory referral to Orthopedics  2. Acute pain of right shoulder Right shoulder pain is new but is taking a backseat to her back pain which is her main concern.  Eventually we will have the shoulder pain addressed as well.  If worsening okay for an office evaluation and  treatment. - Ambulatory referral to Orthopedics  3. Essential hypertension Pressure has climbed up slightly since stopping beta-blocker.  However, dizziness is much better.  She was eating much healthier previously but is started to eat only fast food which I suspect is further elevating blood pressure.  However, generally blood pressures are in the 130s.  We will continue to monitor and treat as needed.    4. Controlled type 2 diabetes mellitus with complication, without long-term current use of insulin ([place]) We will check repeat A1c in August.  Dietary habits are worsened since the last A1c in May.  Once she was able to drive, she started to get fast food for every meal.  5. Depression, recurrent ([place]) Patient is not well controlled.  She is following with psychiatry.  They are in the process of approving [place].  She does have a therapist as well and feels well connected with the therapist as well as her psychiatrist.  They will continue medication management and treatment.  She knows she can reach out to them for help, worsening of mood, and also knows that we are available for these things.  6. Anxiety See above.  Anxiety plays a large role in her mood.  Psychiatry is working on med adjustment to help with control.  7. PTSD (post-traumatic stress disorder) See above.  8. DDD (degenerative disc disease), thoracolumbar See above.  9. Hypothyroidism, unspecified type On Synthroid 50 mcg daily.    Return in about 1 month (around 04/30/2019) for Chronic condition visit.   I discussed the assessment and treatment plan with the patient. The patient was provided an opportunity to ask questions and all were answered. The patient agreed with the plan and demonstrated an understanding of the instructions.   The patient was advised to call back or seek an in-person evaluation if the symptoms worsen or if the condition fails to improve as anticipated.  I provided 30 minutes of non-face-to-face  time during this encounter.   Micheline Rough, MD

## 2019-03-31 ENCOUNTER — Telehealth: Payer: Self-pay | Admitting: *Deleted

## 2019-03-31 NOTE — Telephone Encounter (Signed)
-----   Message from Caren Macadam, MD sent at 03/31/2019  9:05 AM EDT ----- Please let her know I did put in referral for [facility]. I think they have a larger team to address back pain. If worsening of pain in meanwhile then she should have in office eval. Please set up 1 month follow up (ok for 15 minute slot) - let her know this will just be quick follow up to touch base on blood pressure and make sure that referrals are underway/check status of [place].

## 2019-03-31 NOTE — Telephone Encounter (Signed)
I called the pt and informed her of the message below.  Appt scheduled for 8/26 at 3pm.

## 2019-04-05 ENCOUNTER — Other Ambulatory Visit: Payer: Self-pay

## 2019-04-05 ENCOUNTER — Ambulatory Visit (INDEPENDENT_AMBULATORY_CARE_PROVIDER_SITE_OTHER): Payer: Medicare HMO | Admitting: Psychiatry

## 2019-04-05 DIAGNOSIS — F339 Major depressive disorder, recurrent, unspecified: Secondary | ICD-10-CM | POA: Diagnosis not present

## 2019-04-05 DIAGNOSIS — F419 Anxiety disorder, unspecified: Secondary | ICD-10-CM | POA: Diagnosis not present

## 2019-04-05 DIAGNOSIS — F319 Bipolar disorder, unspecified: Secondary | ICD-10-CM | POA: Diagnosis not present

## 2019-04-05 DIAGNOSIS — F4310 Post-traumatic stress disorder, unspecified: Secondary | ICD-10-CM

## 2019-04-06 ENCOUNTER — Encounter (HOSPITAL_COMMUNITY): Payer: Self-pay | Admitting: Psychiatry

## 2019-04-06 NOTE — Progress Notes (Signed)
Virtual Visit via Video Note  I connected with Tiffany Reese on 04/06/19 at  2:00 PM EDT by a video enabled telemedicine application and verified that I am speaking with the correct person using two identifiers.  Location: Patient: Tiffany Reese Provider: [name], LCSW   I discussed the limitations of evaluation and management by telemedicine and the availability of in person appointments. The patient expressed understanding and agreed to proceed.  History of Present Illness: Bipolar 1 DO, PTSD, Anxiety and depression   Observations/Objective: Counselor met with Tiffany Reese for individual therapy via Webex. Counselor assessed MH symptoms and progress on treatment plan goals. Tiffany Reese denied suicidal ideation or self-harm behaviors. Tiffany Reese shared that she met with a medical professional and will be starting [place] treatments next week. Tiffany Reese had several questions and concerns about the treatment. Counselor shared with her a video and some resources that she would learn more about it. Counselor used invivo interventions to walk Tiffany Reese through the process and made a plan for her to prepare for the treatment to reduce anxieties. Counselor reviewed her homework from last week with her goal of swimming in her community pool for exercise. Tiffany Reese reported that she had accomplished one task of purchasing a towel. Counselor walked Tiffany Reese through the remaining tasks and encouraged her in those efforts. Counselor and Tiffany Reese worked on trauma processing of her negative family interactions over the past 17 years. Counselor encouraged Tiffany Reese to do a self-care activity after session due to the heightened emotions she experienced. We discussed emotional regulation exercises for her to engage in.   Assessment and Plan: Counselor will continue to meet with Tiffany Reese to address treatment plan goals. Tiffany Reese will continue to follow recommendations of providers and implement skills learned in session.  Follow Up  Instructions: Counselor will send information for next session via Webex.     I discussed the assessment and treatment plan with the patient. The patient was provided an opportunity to ask questions and all were answered. The patient agreed with the plan and demonstrated an understanding of the instructions.   The patient was advised to call back or seek an in-person evaluation if the symptoms worsen or if the condition fails to improve as anticipated.  I provided 60 minutes of non-face-to-face time during this encounter.   [name], LCSW

## 2019-04-13 ENCOUNTER — Other Ambulatory Visit: Payer: Self-pay

## 2019-04-13 ENCOUNTER — Other Ambulatory Visit (HOSPITAL_COMMUNITY): Payer: Medicare HMO | Attending: Psychiatry | Admitting: Psychiatry

## 2019-04-13 DIAGNOSIS — F339 Major depressive disorder, recurrent, unspecified: Secondary | ICD-10-CM | POA: Diagnosis not present

## 2019-04-13 DIAGNOSIS — F419 Anxiety disorder, unspecified: Secondary | ICD-10-CM | POA: Diagnosis not present

## 2019-04-13 DIAGNOSIS — F332 Major depressive disorder, recurrent severe without psychotic features: Secondary | ICD-10-CM | POA: Insufficient documentation

## 2019-04-13 NOTE — Progress Notes (Signed)
Pt reported to [facility] for cortical mapping and motor threshold determination for Repetitive Transcranial Magnetic Stimulation treatment for Major Depressive Disorder. Pt completed a PHQ-9 with a score of 24(severe depression). Pt also completed a Beck's Depression Inventory with a score of 33(severe depression). Prior to procedure, pt signed an informed consent agreement for [place] treatment. Pt's treatment area was found by applying single pulses to her left motor cortex, hunting along the anterior/posterior plane and along the superior oblique angle until the best motor response was elicited from the pt's right thumb. The best response was observed at 3.7 cmA/P and 30degrees SOA, with a coil angle of 0degrees. Pt's motor threshold was calculated using the [employer]'s proprietary MT Assist algorithm, which produced a calculated motor threshold of 1.67 SMT. Per these findings, pt's treatment parameters are as follows: A/P -- 9.7cm, SOA -- 30degrees, Coil Angle -- 0degrees, Motor Threshold -- 1.49SMT. With these parameters, the pt will receive 36 sessions of TMS according to the following protocol: 3000 pulses per session, with stimulation in bursts of pulses lasting 4 seconds at a frequency of 10 Hz, separated by 26 seconds of rest. After determining pt's tx parameters, coil was moved to the treatment location, and the first burst of pulses was applied at a reduced power of 80%MT. Pt reported that she was experiencing discomfort and pain in treatment location during the first half of tx. However, stated that the stimulation was tolerable and pain was allievated. MD provided pt with pain relieve medication. Upon completion of mapping, pt completed a few treatment intervals for observation of side effects. Pt tolerated tx well. Pt departed from clinic without issue.

## 2019-04-14 ENCOUNTER — Other Ambulatory Visit (HOSPITAL_COMMUNITY): Payer: Self-pay

## 2019-04-14 ENCOUNTER — Other Ambulatory Visit (INDEPENDENT_AMBULATORY_CARE_PROVIDER_SITE_OTHER): Payer: Medicare HMO | Admitting: Emergency Medicine

## 2019-04-14 ENCOUNTER — Other Ambulatory Visit: Payer: Self-pay

## 2019-04-14 DIAGNOSIS — F332 Major depressive disorder, recurrent severe without psychotic features: Secondary | ICD-10-CM | POA: Diagnosis not present

## 2019-04-14 DIAGNOSIS — F419 Anxiety disorder, unspecified: Secondary | ICD-10-CM | POA: Diagnosis not present

## 2019-04-14 DIAGNOSIS — F4310 Post-traumatic stress disorder, unspecified: Secondary | ICD-10-CM

## 2019-04-14 MED ORDER — DIAZEPAM 2 MG PO TABS: 2.0000 mg | ORAL_TABLET | Freq: Three times a day (TID) | ORAL | 0 refills | Status: DC | PRN

## 2019-04-14 NOTE — Progress Notes (Signed)
Patient reported to [facility] for Repetitive Transcranial Magnetic Stimulation treatment for severe episode of recurrent major depressive disorder, without psychotic features. Patient presented with appropriate affect, level mood and denied any suicidal or homicidal ideations. Patient denies any other current symptoms and remains optimistic with continued [place] treatment. Patient reported no change in alcohol/substance use, caffeine consumption, sleep pattern or metal implant status since previous tx. Patient reported to tx session with pleasant affect. She stated that she took Valium and pain relieve medication before tx.Pt listened to meditation music. Powerwas titrated to100% for the duration of tx. Patient reported no complaints or discomfort. Patientdeparted post-treatment with no concerns or complaints.

## 2019-04-15 ENCOUNTER — Other Ambulatory Visit (INDEPENDENT_AMBULATORY_CARE_PROVIDER_SITE_OTHER): Payer: Medicare HMO | Admitting: Emergency Medicine

## 2019-04-15 ENCOUNTER — Other Ambulatory Visit: Payer: Self-pay

## 2019-04-15 DIAGNOSIS — F332 Major depressive disorder, recurrent severe without psychotic features: Secondary | ICD-10-CM

## 2019-04-15 DIAGNOSIS — F419 Anxiety disorder, unspecified: Secondary | ICD-10-CM | POA: Diagnosis not present

## 2019-04-15 NOTE — Progress Notes (Signed)
Patient reported to [facility] for Repetitive Transcranial Magnetic Stimulation treatment for severe episode of recurrent major depressive disorder, without psychotic features. Patient presented with appropriate affect, level mood and denied any suicidal or homicidal ideations. Patient denies any other current symptoms and remains optimistic with continued [place] treatment. Patient reported no change in alcohol/substance use, caffeine consumption, sleep pattern or metal implant status since previous tx. Patient reported to tx session with pleasant affect. She stated that she is experiencing discomfort during her tx.Pt listened to meditation music. Powerwas titrated to100% for the duration of tx and will remain there until patient gets adjusted. Patient reported no complaints or discomfort. Patientdeparted post-treatment with no concerns or complaints.

## 2019-04-18 ENCOUNTER — Other Ambulatory Visit: Payer: Self-pay | Admitting: Internal Medicine

## 2019-04-18 ENCOUNTER — Other Ambulatory Visit (INDEPENDENT_AMBULATORY_CARE_PROVIDER_SITE_OTHER): Payer: Medicare HMO | Admitting: Emergency Medicine

## 2019-04-18 ENCOUNTER — Other Ambulatory Visit: Payer: Self-pay

## 2019-04-18 DIAGNOSIS — F332 Major depressive disorder, recurrent severe without psychotic features: Secondary | ICD-10-CM

## 2019-04-18 DIAGNOSIS — F419 Anxiety disorder, unspecified: Secondary | ICD-10-CM | POA: Diagnosis not present

## 2019-04-18 NOTE — Progress Notes (Signed)
Patient reported to [facility] for Repetitive Transcranial Magnetic Stimulation treatment for severe episode of recurrent major depressive disorder, without psychotic features. Patient presented with appropriate affect, level mood and denied any suicidal or homicidal ideations. Patient denies any other current symptoms and remains optimistic with continued TMS treatment. Patient reported no change in alcohol/substance use, caffeine consumption, sleep pattern or metal implant status since previous tx. Patient reported to tx session with pleasant affect.She stated that she was anxious and depressed this weekend.Ptlistened to meditation music.Powerwas titrated to100% for the duration of txand will remain there until patient gets adjusted. Patient reported no complaints or discomfort. Patientdeparted post-treatment with no concerns or complaints. 

## 2019-04-19 ENCOUNTER — Ambulatory Visit (INDEPENDENT_AMBULATORY_CARE_PROVIDER_SITE_OTHER): Payer: Medicare HMO | Admitting: Psychiatry

## 2019-04-19 ENCOUNTER — Other Ambulatory Visit (HOSPITAL_COMMUNITY): Payer: Self-pay

## 2019-04-19 ENCOUNTER — Other Ambulatory Visit (INDEPENDENT_AMBULATORY_CARE_PROVIDER_SITE_OTHER): Payer: Medicare HMO | Admitting: Emergency Medicine

## 2019-04-19 ENCOUNTER — Other Ambulatory Visit: Payer: Self-pay

## 2019-04-19 DIAGNOSIS — F419 Anxiety disorder, unspecified: Secondary | ICD-10-CM

## 2019-04-19 DIAGNOSIS — F332 Major depressive disorder, recurrent severe without psychotic features: Secondary | ICD-10-CM | POA: Diagnosis not present

## 2019-04-19 DIAGNOSIS — F4310 Post-traumatic stress disorder, unspecified: Secondary | ICD-10-CM | POA: Diagnosis not present

## 2019-04-19 DIAGNOSIS — F319 Bipolar disorder, unspecified: Secondary | ICD-10-CM

## 2019-04-19 MED ORDER — LAMOTRIGINE 25 MG PO TABS: ORAL_TABLET | ORAL | 0 refills | Status: DC

## 2019-04-19 NOTE — Progress Notes (Signed)
Patient reported to [facility] for Repetitive Transcranial Magnetic Stimulation treatment for severe episode of recurrent major depressive disorder, without psychotic features. Patient presented with appropriate affect, level mood and denied any suicidal or homicidal ideations. Patient denies any other current symptoms and remains optimistic with continued [place] treatment. Patient reported no change in alcohol/substance use, caffeine consumption, sleep pattern or metal implant status since previous tx. Patient reported to tx session with pleasant affect.She stated that she was anxious and depressed this weekend.Ptlistened to meditation music.Powerwas titrated to100% for the duration of txand will remain there until patient gets adjusted. Patient reported no complaints or discomfort. Patientdeparted post-treatment with no concerns or complaints.

## 2019-04-20 ENCOUNTER — Other Ambulatory Visit: Payer: Self-pay | Admitting: Family Medicine

## 2019-04-20 ENCOUNTER — Encounter (HOSPITAL_COMMUNITY): Payer: Self-pay | Admitting: Psychiatry

## 2019-04-20 ENCOUNTER — Other Ambulatory Visit: Payer: Self-pay

## 2019-04-20 ENCOUNTER — Other Ambulatory Visit (INDEPENDENT_AMBULATORY_CARE_PROVIDER_SITE_OTHER): Payer: Medicare HMO | Admitting: Emergency Medicine

## 2019-04-20 DIAGNOSIS — F332 Major depressive disorder, recurrent severe without psychotic features: Secondary | ICD-10-CM

## 2019-04-20 DIAGNOSIS — F419 Anxiety disorder, unspecified: Secondary | ICD-10-CM | POA: Diagnosis not present

## 2019-04-20 NOTE — Progress Notes (Signed)
Virtual Visit via Video Note  I connected with Lolita Cram Barrell on 04/19/19 at  2:00 PM EDT by a video enabled telemedicine application and verified that I am speaking with the correct person using two identifiers.  Location: Patient: Tiffany Reese Provider: [name], LCSW   I discussed the limitations of evaluation and management by telemedicine and the availability of in person appointments. The patient expressed understanding and agreed to proceed.  History of Present Illness: MDD, Anxiety and PTSD   Observations/Objective: Counselor met with Tiffany Reese for individual therapy via Webex. Counselor assessed MH symptoms and progress on treatment plan goals. Tiffany Reese denied suicidal ideation or self-harm behaviors. Grey shared that she has began her [place] treatments. Counselor and Tiffany Reese processed the experience together identifying positive impacts, challenges and motivations to continue with the treatment. [place] noted that she has started to have a desire to do more things around her house, which she identified as progress. Counselor and Tiffany Reese worked on creating a list of tasks she would like to complete and dated completion targets. Counselor used MI interventions to encourage motivation towards these efforts. Tiffany Reese will complete certain tasks for homework and report back at our next session.   Assessment and Plan: Counselor will continue to meet with Tiffany Reese to address treatment plan goals. Tiffany Reese will continue to follow recommendations of providers and implement skills learned in session.  Follow Up Instructions: Counselor will send information for next session via Webex.     I discussed the assessment and treatment plan with the patient. The patient was provided an opportunity to ask questions and all were answered. The patient agreed with the plan and demonstrated an understanding of the instructions.   The patient was advised to call back or seek an in-person evaluation if the symptoms  worsen or if the condition fails to improve as anticipated.  I provided 60 minutes of non-face-to-face time during this encounter.   [name], LCSW

## 2019-04-20 NOTE — Progress Notes (Signed)
Patient reported to [facility] for Repetitive Transcranial Magnetic Stimulation treatment for severe episode of recurrent major depressive disorder, without psychotic features. Patient presented with appropriate affect, level mood and denied any suicidal or homicidal ideations. Patient denies any other current symptoms and remains optimistic with continued TMS treatment. Patient reported no change in alcohol/substance use, caffeine consumption, sleep pattern or metal implant status since previous tx. Patient reported to tx session with pleasant affect.She shared that she did not wake up anxious this morning. She was excited about this.Ptlistened to meditation music.Powerwas titrated to120% for the duration of txand will remain there until patient gets adjusted. Patient reported no complaints or discomfort. Patientdeparted post-treatment with no concerns or complaints. 

## 2019-04-21 ENCOUNTER — Encounter (HOSPITAL_COMMUNITY): Payer: Medicare HMO

## 2019-04-21 DIAGNOSIS — M545 Low back pain: Secondary | ICD-10-CM | POA: Diagnosis not present

## 2019-04-21 DIAGNOSIS — M7541 Impingement syndrome of right shoulder: Secondary | ICD-10-CM | POA: Diagnosis not present

## 2019-04-21 DIAGNOSIS — M25511 Pain in right shoulder: Secondary | ICD-10-CM | POA: Diagnosis not present

## 2019-04-21 DIAGNOSIS — M5416 Radiculopathy, lumbar region: Secondary | ICD-10-CM | POA: Diagnosis not present

## 2019-04-22 ENCOUNTER — Other Ambulatory Visit (INDEPENDENT_AMBULATORY_CARE_PROVIDER_SITE_OTHER): Payer: Medicare HMO | Admitting: Emergency Medicine

## 2019-04-22 ENCOUNTER — Other Ambulatory Visit: Payer: Self-pay

## 2019-04-22 DIAGNOSIS — F419 Anxiety disorder, unspecified: Secondary | ICD-10-CM | POA: Diagnosis not present

## 2019-04-22 DIAGNOSIS — F332 Major depressive disorder, recurrent severe without psychotic features: Secondary | ICD-10-CM

## 2019-04-22 NOTE — Progress Notes (Signed)
Patient reported to [facility] for Repetitive Transcranial Magnetic Stimulation treatment for severe episode of recurrent major depressive disorder, without psychotic features. Patient presented with appropriate affect, level mood and denied any suicidal or homicidal ideations. Patient denies any other current symptoms and remains optimistic with continued [place] treatment. Patient reported no change in alcohol/substance use, caffeine consumption, sleep pattern or metal implant status since previous tx. Patient reported to tx session with pleasant affect.She shared that she did not wake up anxious this morning. She was excited about this.Ptlistened to meditation music.Powerwas titrated to120% for the duration of txand will remain there until patient gets adjusted. Patient reported no complaints or discomfort. Patientdeparted post-treatment with no concerns or complaints.

## 2019-04-25 ENCOUNTER — Telehealth: Payer: Self-pay | Admitting: Family Medicine

## 2019-04-25 ENCOUNTER — Other Ambulatory Visit: Payer: Self-pay | Admitting: Family Medicine

## 2019-04-25 ENCOUNTER — Other Ambulatory Visit: Payer: Self-pay

## 2019-04-25 ENCOUNTER — Other Ambulatory Visit (HOSPITAL_COMMUNITY): Payer: Medicare HMO | Attending: Psychiatry | Admitting: Emergency Medicine

## 2019-04-25 DIAGNOSIS — F332 Major depressive disorder, recurrent severe without psychotic features: Secondary | ICD-10-CM | POA: Diagnosis not present

## 2019-04-25 NOTE — Progress Notes (Signed)
Patient reported to [facility] for Repetitive Transcranial Magnetic Stimulation treatment for severe episode of recurrent major depressive disorder, without psychotic features. Patient presented with appropriate affect, level mood and denied any suicidal or homicidal ideations. Patient denies any other current symptoms and remains optimistic with continued [place] treatment. Patient reported no change in alcohol/substance use, caffeine consumption, sleep pattern or metal implant status since previous tx. Patient reported to tx session with pleasant affect.She shared that she had a depressing weekend - sleeping and not getting out of bed. However, she had a great time with one of her friends Friday afternoon. She stated her Lamictal is causing depressive symptoms. She plans to mention this to Dr. Adele Schilder during their appointment.Ptlistened to meditation music.Powerwas titrated to120% for the duration of txand will remain there until patient gets adjusted. Patient reported no complaints or discomfort. Patientdeparted post-treatment with no concerns or complaints.

## 2019-04-25 NOTE — Telephone Encounter (Signed)
She was getting dizzy with bp medicaiton before.   It would be advised with the diabetes, that she consider an Ace/Arb and she is not currently on one of these.   Just thinking she might want to start losartan 25mg  daily and see if this is better med for her that will not contribute as much to dizziness. I would start on lower end of this just to make sure tolerating. She can continue to check at home. Takes a couple of weeks to get into system. If she has tried similar med in past and not tolerated let me know. I don't see on prior list/allergy list. The benefit is that I feel patients tolerate this better with less fatigue than the beta blocker she was previously on. Please schedule 3 week follow up virtual. Ok to send in 30 day supply for her if in agreement.

## 2019-04-25 NOTE — Telephone Encounter (Signed)
Pt states Dr Ethlyn Gallery dc'd her bp med, and advised to monitor it.  Pt thinks she needs to go back a bp med, due to her readings being high. Her bp bottem numbers have been lowest 139/91 and highest 161/106 Bottom number between 90-100 Her face stays red, flushed as well.  [id number] [address], [place] (Phone) [phone] (Fax)

## 2019-04-26 ENCOUNTER — Ambulatory Visit (INDEPENDENT_AMBULATORY_CARE_PROVIDER_SITE_OTHER): Payer: Medicare HMO | Admitting: Psychiatry

## 2019-04-26 ENCOUNTER — Encounter (HOSPITAL_COMMUNITY): Payer: Self-pay | Admitting: Psychiatry

## 2019-04-26 ENCOUNTER — Other Ambulatory Visit (INDEPENDENT_AMBULATORY_CARE_PROVIDER_SITE_OTHER): Payer: Medicare HMO | Admitting: Emergency Medicine

## 2019-04-26 ENCOUNTER — Other Ambulatory Visit: Payer: Self-pay

## 2019-04-26 DIAGNOSIS — F332 Major depressive disorder, recurrent severe without psychotic features: Secondary | ICD-10-CM | POA: Diagnosis not present

## 2019-04-26 DIAGNOSIS — F319 Bipolar disorder, unspecified: Secondary | ICD-10-CM | POA: Diagnosis not present

## 2019-04-26 DIAGNOSIS — F419 Anxiety disorder, unspecified: Secondary | ICD-10-CM

## 2019-04-26 DIAGNOSIS — F4310 Post-traumatic stress disorder, unspecified: Secondary | ICD-10-CM

## 2019-04-26 MED ORDER — LOSARTAN POTASSIUM 25 MG PO TABS: 25.0000 mg | ORAL_TABLET | Freq: Every day | ORAL | 0 refills | Status: DC

## 2019-04-26 MED ORDER — AMITRIPTYLINE HCL 150 MG PO TABS: 150.0000 mg | ORAL_TABLET | Freq: Every day | ORAL | 1 refills | Status: DC

## 2019-04-26 MED ORDER — LURASIDONE HCL 80 MG PO TABS: 80.0000 mg | ORAL_TABLET | Freq: Every day | ORAL | 1 refills | Status: DC

## 2019-04-26 NOTE — Progress Notes (Signed)
Patient reported to [facility] for Repetitive Transcranial Magnetic Stimulation treatment for severe episode of recurrent major depressive disorder, without psychotic features. Patient presented with appropriate affect, level mood and denied any suicidal or homicidal ideations. Patient denies any other current symptoms and remains optimistic with continued [place] treatment. Patient reported no change in alcohol/substance use, caffeine consumption, sleep pattern or metal implant status since previous tx. Patient reported to tx session with pleasant affect.She shared that she had a depressing weekend - sleeping and not getting out of bed. However, she had a great time with one of her friends Friday afternoon. She shared that she woke up this morning feel anxious. Powerwas titrated to120% for the duration of txand will remain there until patient gets adjusted. Patient reported no complaints or discomfort. Patientdeparted post-treatment with no concerns or complaints.
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
## 2019-04-26 NOTE — Telephone Encounter (Signed)
I called the pt and informed her of the message below.  Patient stated she was not aware that she had diabetes?  Stated she does not recall taking a medication like this before.  Appt previously scheduled for 8/26 and the pt stated she thought this was to be an in office visit not a virtual visit?  Message sent to Dr Ethlyn Gallery.

## 2019-04-27 ENCOUNTER — Other Ambulatory Visit: Payer: Self-pay

## 2019-04-27 ENCOUNTER — Other Ambulatory Visit (INDEPENDENT_AMBULATORY_CARE_PROVIDER_SITE_OTHER): Payer: Medicare HMO | Admitting: Emergency Medicine

## 2019-04-27 DIAGNOSIS — F332 Major depressive disorder, recurrent severe without psychotic features: Secondary | ICD-10-CM | POA: Diagnosis not present

## 2019-04-27 NOTE — Progress Notes (Signed)
Patient reported to [facility] for Repetitive Transcranial Magnetic Stimulation treatment for severe episode of recurrent major depressive disorder, without psychotic features. Patient presented with appropriate affect, level mood and denied any suicidal or homicidal ideations. Patient denies any other current symptoms and remains optimistic with continued [place] treatment. Patient reported no change in alcohol/substance use, caffeine consumption, sleep pattern or metal implant status since previous tx. Patient reported to tx session with pleasant affect.[name] that she had a great appointment with Dr. Adele Schilder and reported that she no longer will be taking Lamictal. Powerwas titrated to120% for the duration of txand will remain there until patient gets adjusted. Patient reported no complaints or discomfort. Patientdeparted post-treatment with no concerns or complaints.

## 2019-04-27 NOTE — Telephone Encounter (Signed)
She had just crossed threshold of diabetes and if she recalls her previous provider had placed her on Januvia for her elevated sugars. She had been diet controlled so we did not continue this medication. We discussed at last visit as well because this is why diet is so important for her. Eating healthy will help her feel better and keep sugars in normal range.   Ok if she wants to do in office visit.   Let's start the losartan 25mg  and have her keep checking blood pressures at home. We can follow up then at her 8/26 visit unless she has concerns sooner. (please send 30 tab rx with 2 refills if in agreement)

## 2019-04-27 NOTE — Telephone Encounter (Signed)
I called the pt and informed her of the message below.  Patient stated she would prefer a phone call for the visit on 8/26 and this was added to the appt notes.  Rx was sent in on 8/4.

## 2019-04-28 ENCOUNTER — Other Ambulatory Visit: Payer: Self-pay

## 2019-04-28 ENCOUNTER — Other Ambulatory Visit (INDEPENDENT_AMBULATORY_CARE_PROVIDER_SITE_OTHER): Payer: Medicare HMO | Admitting: Emergency Medicine

## 2019-04-28 DIAGNOSIS — F332 Major depressive disorder, recurrent severe without psychotic features: Secondary | ICD-10-CM | POA: Diagnosis not present

## 2019-04-28 NOTE — Progress Notes (Signed)
Patient reported to [facility] for Repetitive Transcranial Magnetic Stimulation treatment for severe episode of recurrent major depressive disorder, without psychotic features. Patient presented with appropriate affect, level mood and denied any suicidal or homicidal ideations. Patient denies any other current symptoms and remains optimistic with continued [place] treatment. Patient reported no change in alcohol/substance use, caffeine consumption, sleep pattern or metal implant status since previous tx. Patient reported to tx session with pleasant affect.[name] that her dog is sick so she is spending time taking care of her dog. Powerwas titrated to120% for the duration of txand will remain there until patient gets adjusted. Patient reported no complaints or discomfort. Patientdeparted post-treatment with no concerns or complaints.

## 2019-04-29 ENCOUNTER — Other Ambulatory Visit: Payer: Self-pay

## 2019-04-29 ENCOUNTER — Other Ambulatory Visit (INDEPENDENT_AMBULATORY_CARE_PROVIDER_SITE_OTHER): Payer: Medicare HMO | Admitting: Emergency Medicine

## 2019-04-29 DIAGNOSIS — F332 Major depressive disorder, recurrent severe without psychotic features: Secondary | ICD-10-CM

## 2019-04-29 NOTE — Progress Notes (Signed)
Patient reported to [facility] for Repetitive Transcranial Magnetic Stimulation treatment for severe episode of recurrent major depressive disorder, without psychotic features. Patient presented with appropriate affect, level mood and denied any suicidal or homicidal ideations. Patient denies any other current symptoms and remains optimistic with continued [place] treatment. Patient reported no change in alcohol/substance use, caffeine consumption, sleep pattern or metal implant status since previous tx. Patient reported to tx session with pleasant affect.[name] that she went on a 5 minute walk. Powerwas titrated to120% for the duration of txand will remain there until patient gets adjusted. Patient reported no complaints or discomfort. Patientdeparted post-treatment with no concerns or complaints.

## 2019-05-02 ENCOUNTER — Other Ambulatory Visit (INDEPENDENT_AMBULATORY_CARE_PROVIDER_SITE_OTHER): Payer: Medicare HMO | Admitting: Emergency Medicine

## 2019-05-02 ENCOUNTER — Other Ambulatory Visit: Payer: Self-pay

## 2019-05-02 DIAGNOSIS — F332 Major depressive disorder, recurrent severe without psychotic features: Secondary | ICD-10-CM | POA: Diagnosis not present

## 2019-05-02 NOTE — Progress Notes (Signed)
Patient reported to [facility] for Repetitive Transcranial Magnetic Stimulation treatment for severe episode of recurrent major depressive disorder, without psychotic features. Patient presented with appropriate affect, level mood and denied any suicidal or homicidal ideations. Patient denies any other current symptoms and remains optimistic with continued TMS treatment. Patient reported no change in alcohol/substance use, caffeine consumption, sleep pattern or metal implant status since previous tx. Patient reported to tx session with pleasant affect.She shared that she had a good weekend. She is taking her dog to the vet today.Powerwas titrated to120% for the duration of txand will remain there until patient gets adjusted. Patient reported no complaints or discomfort. Patientdeparted post-treatment with no concerns or complaints. 

## 2019-05-03 ENCOUNTER — Other Ambulatory Visit (INDEPENDENT_AMBULATORY_CARE_PROVIDER_SITE_OTHER): Payer: Medicare HMO | Admitting: Emergency Medicine

## 2019-05-03 ENCOUNTER — Other Ambulatory Visit: Payer: Self-pay

## 2019-05-03 DIAGNOSIS — F332 Major depressive disorder, recurrent severe without psychotic features: Secondary | ICD-10-CM

## 2019-05-03 NOTE — Progress Notes (Signed)
Patient reported to [facility] for Repetitive Transcranial Magnetic Stimulation treatment for severe episode of recurrent major depressive disorder, without psychotic features. Patient presented with appropriate affect, level mood and denied any suicidal or homicidal ideations. Patient denies any other current symptoms and remains optimistic with continued [place] treatment. Patient reported no change in alcohol/substance use, caffeine consumption, sleep pattern or metal implant status since previous tx. Patient reported to tx session with pleasant affect.[name] that walked around her neighborhood pool for 15 minutes.Powerwas titrated to120% for the duration of txand will remain there until patient gets adjusted. Patient reported no complaints or discomfort. Patientdeparted post-treatment with no concerns or complaints.

## 2019-05-04 ENCOUNTER — Other Ambulatory Visit (INDEPENDENT_AMBULATORY_CARE_PROVIDER_SITE_OTHER): Payer: Medicare HMO | Admitting: Emergency Medicine

## 2019-05-04 ENCOUNTER — Other Ambulatory Visit: Payer: Self-pay

## 2019-05-04 DIAGNOSIS — F332 Major depressive disorder, recurrent severe without psychotic features: Secondary | ICD-10-CM | POA: Diagnosis not present

## 2019-05-04 NOTE — Progress Notes (Signed)
Patient reported to [facility] for Repetitive Transcranial Magnetic Stimulation treatment for severe episode of recurrent major depressive disorder, without psychotic features. Patient presented with appropriate affect, level mood and denied any suicidal or homicidal ideations. Patient denies any other current symptoms and remains optimistic with continued [place] treatment. Patient reported no change in alcohol/substance use, caffeine consumption, sleep pattern or metal implant status since previous tx. Patient reported to tx session with pleasant affect.She shared that she had a good weekend. She is taking her dog to the vet today.Powerwas titrated to120% for the duration of txand will remain there until patient gets adjusted. Patient reported no complaints or discomfort. Patientdeparted post-treatment with no concerns or complaints.

## 2019-05-05 ENCOUNTER — Other Ambulatory Visit: Payer: Self-pay

## 2019-05-05 ENCOUNTER — Other Ambulatory Visit (INDEPENDENT_AMBULATORY_CARE_PROVIDER_SITE_OTHER): Payer: Medicare HMO | Admitting: Emergency Medicine

## 2019-05-05 DIAGNOSIS — F332 Major depressive disorder, recurrent severe without psychotic features: Secondary | ICD-10-CM | POA: Diagnosis not present

## 2019-05-05 NOTE — Progress Notes (Signed)
Patient reported to [facility] for Repetitive Transcranial Magnetic Stimulation treatment for severe episode of recurrent major depressive disorder, without psychotic features. Patient presented with appropriate affect, level mood and denied any suicidal or homicidal ideations. Patient denies any other current symptoms and remains optimistic with continued [place] treatment. Patient reported no change in alcohol/substance use, caffeine consumption, sleep pattern or metal implant status since previous tx. Patient reported to tx session with pleasant affect.She shared that she got new shoes because she is going to start walking.Powerwas titrated to120% for the duration of txand will remain there until patient gets adjusted. Patient reported no complaints or discomfort. Patientdeparted post-treatment with no concerns or complaints.

## 2019-05-06 ENCOUNTER — Other Ambulatory Visit: Payer: Self-pay

## 2019-05-06 ENCOUNTER — Other Ambulatory Visit (INDEPENDENT_AMBULATORY_CARE_PROVIDER_SITE_OTHER): Payer: Medicare HMO | Admitting: Emergency Medicine

## 2019-05-06 DIAGNOSIS — F332 Major depressive disorder, recurrent severe without psychotic features: Secondary | ICD-10-CM

## 2019-05-06 NOTE — Progress Notes (Signed)
Patient reported to [facility] for Repetitive Transcranial Magnetic Stimulation treatment for severe episode of recurrent major depressive disorder, without psychotic features. Patient presented with appropriate affect, level mood and denied any suicidal or homicidal ideations. Patient denies any other current symptoms and remains optimistic with continued TMS treatment. Patient reported no change in alcohol/substance use, caffeine consumption, sleep pattern or metal implant status since previous tx. Patient reported to tx session with pleasant affect.She shared that she will be cleaning her house this weekend.Powerwas titrated to120% for the duration of txand will remain there until patient gets adjusted. Patient reported no complaints or discomfort. Patientdeparted post-treatment with no concerns or complaints. 

## 2019-05-09 ENCOUNTER — Other Ambulatory Visit: Payer: Self-pay | Admitting: Family Medicine

## 2019-05-09 DIAGNOSIS — M25572 Pain in left ankle and joints of left foot: Secondary | ICD-10-CM

## 2019-05-09 DIAGNOSIS — M5136 Other intervertebral disc degeneration, lumbar region: Secondary | ICD-10-CM | POA: Diagnosis not present

## 2019-05-10 ENCOUNTER — Other Ambulatory Visit (HOSPITAL_COMMUNITY): Payer: Self-pay | Admitting: Psychiatry

## 2019-05-10 DIAGNOSIS — F319 Bipolar disorder, unspecified: Secondary | ICD-10-CM

## 2019-05-11 ENCOUNTER — Other Ambulatory Visit (INDEPENDENT_AMBULATORY_CARE_PROVIDER_SITE_OTHER): Payer: Medicare HMO | Admitting: Emergency Medicine

## 2019-05-11 DIAGNOSIS — F332 Major depressive disorder, recurrent severe without psychotic features: Secondary | ICD-10-CM | POA: Diagnosis not present

## 2019-05-12 ENCOUNTER — Other Ambulatory Visit (INDEPENDENT_AMBULATORY_CARE_PROVIDER_SITE_OTHER): Payer: Medicare HMO | Admitting: Emergency Medicine

## 2019-05-12 ENCOUNTER — Other Ambulatory Visit: Payer: Self-pay

## 2019-05-12 DIAGNOSIS — F332 Major depressive disorder, recurrent severe without psychotic features: Secondary | ICD-10-CM

## 2019-05-12 NOTE — Progress Notes (Signed)
Patient reported to [facility] for Repetitive Transcranial Magnetic Stimulation treatment for severe episode of recurrent major depressive disorder, without psychotic features. Patient presented with appropriate affect, level mood and denied any suicidal or homicidal ideations. Patient denies any other current symptoms and remains optimistic with continued TMS treatment. Patient reported no change in alcohol/substance use, caffeine consumption, sleep pattern or metal implant status since previous tx. Patient reported to tx session with pleasant affect.She shared that she will be cleaning her house this weekend.Powerwas titrated to120% for the duration of txand will remain there until patient gets adjusted. Patient reported no complaints or discomfort. Patientdeparted post-treatment with no concerns or complaints. 

## 2019-05-12 NOTE — Progress Notes (Signed)
Patient reported to [facility] for Repetitive Transcranial Magnetic Stimulation treatment for severe episode of recurrent major depressive disorder, without psychotic features. Patient presented with appropriate affect, level mood and denied any suicidal or homicidal ideations. Patient denies any other current symptoms and remains optimistic with continued [place] treatment. Patient reported no change in alcohol/substance use, caffeine consumption, sleep pattern or metal implant status since previous tx. Patient reported to tx session with pleasant affect.She shared that she will be cleaning her house this weekend.Powerwas titrated to120% for the duration of txand will remain there until patient gets adjusted. Patient reported no complaints or discomfort. Patientdeparted post-treatment with no concerns or complaints.

## 2019-05-13 ENCOUNTER — Encounter (HOSPITAL_COMMUNITY): Payer: Medicare HMO

## 2019-05-16 ENCOUNTER — Other Ambulatory Visit (INDEPENDENT_AMBULATORY_CARE_PROVIDER_SITE_OTHER): Payer: Medicare HMO | Admitting: Emergency Medicine

## 2019-05-16 ENCOUNTER — Other Ambulatory Visit: Payer: Self-pay

## 2019-05-16 DIAGNOSIS — M5136 Other intervertebral disc degeneration, lumbar region: Secondary | ICD-10-CM | POA: Diagnosis not present

## 2019-05-16 DIAGNOSIS — F332 Major depressive disorder, recurrent severe without psychotic features: Secondary | ICD-10-CM | POA: Diagnosis not present

## 2019-05-16 NOTE — Progress Notes (Signed)
Patient reported to [facility] for Repetitive Transcranial Magnetic Stimulation treatment for severe episode of recurrent major depressive disorder, without psychotic features. Patient presented with appropriate affect, level mood and denied any suicidal or homicidal ideations. Patient denies any other current symptoms and remains optimistic with continued TMS treatment. Patient reported no change in alcohol/substance use, caffeine consumption, sleep pattern or metal implant status since previous tx. Patient reported to tx session with pleasant affect.She shared that she practiced mindfulness this weekend and soent time with her dog.Powerwas titrated to120% for the duration of txand will remain there until patient gets adjusted. Patient reported no complaints or discomfort. Patientdeparted post-treatment with no concerns or complaints. 

## 2019-05-17 ENCOUNTER — Other Ambulatory Visit (INDEPENDENT_AMBULATORY_CARE_PROVIDER_SITE_OTHER): Payer: Medicare HMO | Admitting: Emergency Medicine

## 2019-05-17 ENCOUNTER — Other Ambulatory Visit (HOSPITAL_COMMUNITY): Payer: Self-pay

## 2019-05-17 ENCOUNTER — Other Ambulatory Visit: Payer: Self-pay

## 2019-05-17 DIAGNOSIS — F419 Anxiety disorder, unspecified: Secondary | ICD-10-CM

## 2019-05-17 DIAGNOSIS — F332 Major depressive disorder, recurrent severe without psychotic features: Secondary | ICD-10-CM | POA: Diagnosis not present

## 2019-05-17 DIAGNOSIS — F4310 Post-traumatic stress disorder, unspecified: Secondary | ICD-10-CM

## 2019-05-17 MED ORDER — DIAZEPAM 2 MG PO TABS: 2.0000 mg | ORAL_TABLET | Freq: Three times a day (TID) | ORAL | 0 refills | Status: DC | PRN

## 2019-05-17 NOTE — Progress Notes (Signed)
Patient reported to [facility] for Repetitive Transcranial Magnetic Stimulation treatment for severe episode of recurrent major depressive disorder, without psychotic features. Patient presented with appropriate affect, level mood and denied any suicidal or homicidal ideations. Patient denies any other current symptoms and remains optimistic with continued [place] treatment. Patient reported no change in alcohol/substance use, caffeine consumption, sleep pattern or metal implant status since previous tx. Patient reported to tx session with pleasant affect.She shared that she practiced mindfulness this weekend and soent time with her dog.Powerwas titrated to120% for the duration of txand will remain there until patient gets adjusted. Patient reported no complaints or discomfort. Patientdeparted post-treatment with no concerns or complaints.

## 2019-05-18 ENCOUNTER — Encounter: Payer: Self-pay | Admitting: Family Medicine

## 2019-05-18 ENCOUNTER — Other Ambulatory Visit: Payer: Self-pay | Admitting: Family Medicine

## 2019-05-18 ENCOUNTER — Other Ambulatory Visit: Payer: Self-pay

## 2019-05-18 ENCOUNTER — Ambulatory Visit (INDEPENDENT_AMBULATORY_CARE_PROVIDER_SITE_OTHER): Payer: Medicare HMO | Admitting: Family Medicine

## 2019-05-18 ENCOUNTER — Other Ambulatory Visit (INDEPENDENT_AMBULATORY_CARE_PROVIDER_SITE_OTHER): Payer: Medicare HMO | Admitting: Emergency Medicine

## 2019-05-18 VITALS — BP 154/98

## 2019-05-18 DIAGNOSIS — E118 Type 2 diabetes mellitus with unspecified complications: Secondary | ICD-10-CM

## 2019-05-18 DIAGNOSIS — M545 Low back pain, unspecified: Secondary | ICD-10-CM

## 2019-05-18 DIAGNOSIS — F419 Anxiety disorder, unspecified: Secondary | ICD-10-CM

## 2019-05-18 DIAGNOSIS — E039 Hypothyroidism, unspecified: Secondary | ICD-10-CM | POA: Diagnosis not present

## 2019-05-18 DIAGNOSIS — G8929 Other chronic pain: Secondary | ICD-10-CM

## 2019-05-18 DIAGNOSIS — I10 Essential (primary) hypertension: Secondary | ICD-10-CM

## 2019-05-18 DIAGNOSIS — F339 Major depressive disorder, recurrent, unspecified: Secondary | ICD-10-CM | POA: Diagnosis not present

## 2019-05-18 DIAGNOSIS — F332 Major depressive disorder, recurrent severe without psychotic features: Secondary | ICD-10-CM | POA: Diagnosis not present

## 2019-05-18 DIAGNOSIS — D649 Anemia, unspecified: Secondary | ICD-10-CM | POA: Diagnosis not present

## 2019-05-18 MED ORDER — LOSARTAN POTASSIUM 50 MG PO TABS: 50.0000 mg | ORAL_TABLET | Freq: Every day | ORAL | 1 refills | Status: DC

## 2019-05-18 NOTE — Progress Notes (Signed)
Patient reported to [facility] for Repetitive Transcranial Magnetic Stimulation treatment for severe episode of recurrent major depressive disorder, without psychotic features. Patient presented with appropriate affect, level mood and denied any suicidal or homicidal ideations. Patient denies any other current symptoms and remains optimistic with continued [place] treatment. Patient reported no change in alcohol/substance use, caffeine consumption, sleep pattern or metal implant status since previous tx. Patient reported to tx session with pleasant affect.[name] that she experiences more vivid dreams. She expressed that [place] has been helping her energy level and mood.Powerwas titrated to120% for the duration of txand will remain there until patient gets adjusted. Patient reported no complaints or discomfort. Patientdeparted post-treatment with no concerns or complaints.

## 2019-05-18 NOTE — Progress Notes (Signed)
Virtual Visit via Video Note  I connected with Tiffany Reese on 05/18/19 at  3:00 PM EDT by a video enabled telemedicine application and verified that I am speaking with the correct person using two identifiers.  Location patient: home Location provider:work or home office Persons participating in the virtual visit: patient, provider  I discussed the limitations of evaluation and management by telemedicine and the availability of in person appointments. The patient expressed understanding and agreed to proceed.  Unable to do video feed; completed via audio only.  Tiffany Reese DOB: April 05, 1961 Encounter date: 05/18/2019  This is a 58 y.o. female who presents with Chief Complaint  Patient presents with  . Follow-up    History of present illness: Blood pressure is still running high. Wakes up with red cheek - like someone slapped her in cheek.   149/94 is lowest. Usually 154/96. Taking losartan 64m. Not taking the lasix right now because [place] before she goes because she has to lay in chair for procedure.   Feels weak all the time. Started iron today.   Spot on leg that she is worried about and another on forehead. Wants to make sure not melanoma.   Back to drinking water. Has cut out sugary drinks. Eating more carbs than should, but trying to progress to protein and veggies. Starting to cook food herself at this point.   Going to PT for back.   [place]ing [place] States that it hurts. Doing this for 9 weeks M-F. Each session is 37 minutes. Can tell that it is helping her. Wakes up in middle of night sometimes. Does fall back asleep easily.  On Fridays has girl day with friend. She feels like she will be doing better once she gets back corrected. Doing more mindful thinking. Thinking about how to get herself out of situations and make things better. This is hard for her. [facility] Walking more, doing more  around home. Still in cocoon a lot.   Taking B12 gummy bears.   Ankle is doing better. Still feels like she isn't walking completely normal.    Allergies  Allergen Reactions  . Bupropion Other (See Comments)    Causes agitation  . Duloxetine Other (See Comments)    Causes involuntary movements  . Sulfa Antibiotics   . Metformin And Related     vomiting  . Paxil [Paroxetine Hcl]     Insomnia; manic  . Prozac [Fluoxetine Hcl]     Hyperactive, insomnia  . Trintellix [Vortioxetine]     Triggers fibromyalgia  . Zoloft [Sertraline Hcl]     Hyperactivity, insomnia   Current Meds  Medication Sig  . ACCU-CHEK AVIVA PLUS test strip USE AS DIRECTED UP TO 4 TIMES A DAY  . Accu-Chek Softclix Lancets lancets USE AS DIRECTED UP TO 4 TIMES A DAY  . amitriptyline (ELAVIL) 150 MG tablet Take 1 tablet (150 mg total) by mouth at bedtime.  . blood glucose meter kit and supplies KIT Dispense based on patient and insurance preference. Use up to four times daily as directed. (FOR ICD-9 250.00, 250.01).  . Blood Pressure Monitoring (BLOOD PRESSURE MONITOR/L CUFF) MISC 1 Device by Does not apply route daily.  . cetirizine (ZYRTEC) 10 MG tablet TAKE 1 TABLET BY MOUTH EVERY DAY*PAT NEEDS APPT  . clotrimazole (LOTRIMIN) 1 % cream Apply 1 application topically 2 (two) times daily. Use on dry, clean skin. Treat for 14  days or 48 hours after rash clears.  . diazepam (VALIUM) 2 MG tablet Take 1 tablet (2 mg total) by mouth 3 (three) times daily as needed for anxiety (severe anxiety).  Marland Kitchen diclofenac (VOLTAREN) 75 MG EC tablet TAKE 1 TABLET (75 MG TOTAL) BY MOUTH 2 (TWO) TIMES DAILY AS NEEDED FOR MODERATE PAIN.  . furosemide (LASIX) 20 MG tablet Take 20 mg by mouth daily.  Marland Kitchen levothyroxine (SYNTHROID, LEVOTHROID) 50 MCG tablet Take 1 tablet (50 mcg total) by mouth daily before breakfast.  . lurasidone (LATUDA) 80 MG TABS tablet Take 1 tablet (80 mg total) by mouth daily with breakfast.  . Multiple  Vitamins-Minerals (MULTIVITAMIN ADULT PO) Take 1 tablet by mouth daily.  Marland Kitchen omeprazole (PRILOSEC) 40 MG capsule Take 1 capsule (40 mg total) by mouth 2 (two) times a day.  . pregabalin (LYRICA) 50 MG capsule Take 1 capsule (50 mg total) by mouth 2 (two) times daily.  . [DISCONTINUED] lamoTRIgine (LAMICTAL) 25 MG tablet Take two tab daily  . [DISCONTINUED] losartan (COZAAR) 25 MG tablet Take 1 tablet (25 mg total) by mouth daily.    Review of Systems  Constitutional: Positive for fatigue. Negative for chills and fever.  Respiratory: Negative for cough, chest tightness, shortness of breath and wheezing.   Cardiovascular: Negative for chest pain, palpitations and leg swelling.  Neurological: Positive for headaches (right frontal; notes since starting [place] (but this is on left side)).    Objective:  BP (!) 154/98 Comment: taken by pt-jaf      BP Readings from Last 3 Encounters:  05/18/19 (!) 154/98  03/24/19 138/80  11/29/18 110/68   Wt Readings from Last 3 Encounters:  03/24/19 297 lb (134.7 kg)  02/16/19 283 lb 9.6 oz (128.6 kg)  11/16/18 290 lb 8 oz (131.8 kg)    EXAM:  GENERAL: sounds alert, oriented, and in no distress.  LUNGS: no noted difficulty with breathing or shortness of breath.  PSYCH/NEURO: she is notably more focused with conversation today and much more goal oriented. She is able to talk through small steps of improvement she has made and is getting around more in house. She understands she is still far from her goals, but she is working towards healthy behaviors to aid with her overall improvement. She is not tearful on phone. She is much less tangential than previous conversations.   Assessment/Plan  1. Anemia, unspecified type She has had issues with anemia in the past.  We will recheck blood work to make sure that this is stable.  We discussed maximizing medical treatment to help with fatigue. - Vitamin B12; Future - IBC + Ferritin; Future  2. Controlled type  2 diabetes mellitus with complication, without long-term current use of insulin ([place]) We will recheck blood work. - Hemoglobin A1c; Future - Ambulatory referral to Ophthalmology; Future  3. Anxiety Anxiety has been fairly stable.  She is still meeting regularly with therapist.  She is starting to follow through on tasks and has better focus than she has previously.  Still meeting regularly with psychiatry.  She is about halfway through [place] treatment.  4. Depression, recurrent ([place]) See above.  Mood is improved slightly.  5. Hypothyroidism, unspecified type - TSH; Future  6. Hypertension, essential, benign Blood pressure is elevating.  She had issues but surgically with low pressures, but is now starting to climb slightly.  We will increase her losartan to 50 mg daily. - CBC with Differential/Platelet; Future - Comprehensive metabolic panel; Future  7. Chronic  midline low back pain, unspecified whether sciatica present She is noting some improvement in working with therapy regularly.  Return in about 3 weeks (around 06/08/2019), or for phone call follow up/CCV.   I discussed the assessment and treatment plan with the patient. The patient was provided an opportunity to ask questions and all were answered. The patient agreed with the plan and demonstrated an understanding of the instructions.   The patient was advised to call back or seek an in-person evaluation if the symptoms worsen or if the condition fails to improve as anticipated.  I provided 35 minutes of non-face-to-face time during this encounter.   Micheline Rough, MD

## 2019-05-19 ENCOUNTER — Other Ambulatory Visit: Payer: Self-pay

## 2019-05-19 ENCOUNTER — Telehealth: Payer: Self-pay | Admitting: *Deleted

## 2019-05-19 ENCOUNTER — Other Ambulatory Visit (INDEPENDENT_AMBULATORY_CARE_PROVIDER_SITE_OTHER): Payer: Medicare HMO | Admitting: Emergency Medicine

## 2019-05-19 DIAGNOSIS — M5136 Other intervertebral disc degeneration, lumbar region: Secondary | ICD-10-CM | POA: Diagnosis not present

## 2019-05-19 DIAGNOSIS — F332 Major depressive disorder, recurrent severe without psychotic features: Secondary | ICD-10-CM | POA: Diagnosis not present

## 2019-05-19 NOTE — Telephone Encounter (Signed)
-----   Message from Caren Macadam, MD sent at 05/18/2019  5:26 PM EDT ----- 3weeks follow up chronic condition

## 2019-05-19 NOTE — Telephone Encounter (Signed)
Left a detailed message for the pt to call back for an appt as below.   

## 2019-05-19 NOTE — Progress Notes (Signed)
Patient reported to [facility] for Repetitive Transcranial Magnetic Stimulation treatment for severe episode of recurrent major depressive disorder, without psychotic features. Patient presented with appropriate affect, level mood and denied any suicidal or homicidal ideations. Patient denies any other current symptoms and remains optimistic with continued [place] treatment. Patient reported no change in alcohol/substance use, caffeine consumption, sleep pattern or metal implant status since previous tx. Patient reported to tx session with pleasant affect.[name] that she started cleaning her living room. She expressed that [place] has been helping her energy level and mood. Also, she did physical therapy exercises. Powerwas titrated to120% for the duration of txand will remain there until patient gets adjusted. Patient reported no complaints or discomfort. Patientdeparted post-treatment with no concerns or complaints.

## 2019-05-20 ENCOUNTER — Other Ambulatory Visit: Payer: Self-pay

## 2019-05-20 ENCOUNTER — Other Ambulatory Visit (INDEPENDENT_AMBULATORY_CARE_PROVIDER_SITE_OTHER): Payer: Medicare HMO | Admitting: Emergency Medicine

## 2019-05-20 ENCOUNTER — Encounter (HOSPITAL_COMMUNITY): Payer: Medicare HMO

## 2019-05-20 ENCOUNTER — Telehealth: Payer: Self-pay | Admitting: *Deleted

## 2019-05-20 DIAGNOSIS — F332 Major depressive disorder, recurrent severe without psychotic features: Secondary | ICD-10-CM

## 2019-05-20 NOTE — Progress Notes (Signed)
Patient reported to [facility] for Repetitive Transcranial Magnetic Stimulation treatment for severe episode of recurrent major depressive disorder, without psychotic features. Patient presented with appropriate affect, level mood and denied any suicidal or homicidal ideations. Patient denies any other current symptoms and remains optimistic with continued TMS treatment. Patient reported no change in alcohol/substance use, caffeine consumption, sleep pattern or metal implant status since previous tx. Patient reported to tx session with pleasant affect.[name] that she started cleaning her living room. She expressed that TMS has been helping her energy level and mood. Also, she did physical therapy exercises. Powerwas titrated to120% for the duration of txand will remain there until patient gets adjusted. Patient reported no complaints or discomfort. Patientdeparted post-treatment with no concerns or complaints. 

## 2019-05-20 NOTE — Telephone Encounter (Signed)
Called pt and she stated she will check her schedule and call back for the appts as below due to [place].  Also declined referral for colonoscopy at this time.

## 2019-05-20 NOTE — Telephone Encounter (Signed)
-----   Message from Caren Macadam, MD sent at 05/19/2019 10:05 PM EDT ----- Please set up bloodwork for her and then return virtual (or phone) visit in 3 weeks time. Please also ask her if she is ready for referral for colonoscopy? We were waiting until she felt that this would be more manageable for her. If ready please put in referral.

## 2019-05-23 ENCOUNTER — Other Ambulatory Visit (INDEPENDENT_AMBULATORY_CARE_PROVIDER_SITE_OTHER): Payer: Medicare HMO | Admitting: Emergency Medicine

## 2019-05-23 ENCOUNTER — Other Ambulatory Visit: Payer: Self-pay

## 2019-05-23 DIAGNOSIS — M545 Low back pain: Secondary | ICD-10-CM | POA: Diagnosis not present

## 2019-05-23 DIAGNOSIS — F332 Major depressive disorder, recurrent severe without psychotic features: Secondary | ICD-10-CM | POA: Diagnosis not present

## 2019-05-23 NOTE — Progress Notes (Signed)
Patient reported to [facility] for Repetitive Transcranial Magnetic Stimulation treatment for severe episode of recurrent major depressive disorder, without psychotic features. Patient presented with appropriate affect, level mood and denied any suicidal or homicidal ideations. Patient denies any other current symptoms and remains optimistic with continued TMS treatment. Patient reported no change in alcohol/substance use, caffeine consumption, sleep pattern or metal implant status since previous tx. Patient reported to tx session with pleasant affect.Shespoke with writer.Powerwas titrated to120% for the duration of txand will remain there until patient gets adjusted. Patient reported no complaints or discomfort. Patientdeparted post-treatment with no concerns or complaints. 

## 2019-05-24 ENCOUNTER — Other Ambulatory Visit (HOSPITAL_COMMUNITY): Payer: Medicare HMO | Attending: Psychiatry | Admitting: Emergency Medicine

## 2019-05-24 ENCOUNTER — Other Ambulatory Visit: Payer: Self-pay

## 2019-05-24 DIAGNOSIS — F332 Major depressive disorder, recurrent severe without psychotic features: Secondary | ICD-10-CM

## 2019-05-24 NOTE — Progress Notes (Signed)
Patient reported to [facility] for Repetitive Transcranial Magnetic Stimulation treatment for severe episode of recurrent major depressive disorder, without psychotic features. Patient presented with appropriate affect, level mood and denied any suicidal or homicidal ideations. Patient denies any other current symptoms and remains optimistic with continued [place] treatment. Patient reported no change in alcohol/substance use, caffeine consumption, sleep pattern or metal implant status since previous tx. Patient reported to tx session with pleasant affect.Pt was given homework assignment to complete positive affirmation list. Powerwas titrated to120% for the duration of txand will remain there until patient gets adjusted. Patient reported no complaints or discomfort. Patientdeparted post-treatment with no concerns or complaints.

## 2019-05-25 ENCOUNTER — Other Ambulatory Visit (INDEPENDENT_AMBULATORY_CARE_PROVIDER_SITE_OTHER): Payer: Medicare HMO | Admitting: Emergency Medicine

## 2019-05-25 ENCOUNTER — Other Ambulatory Visit: Payer: Self-pay

## 2019-05-25 DIAGNOSIS — F332 Major depressive disorder, recurrent severe without psychotic features: Secondary | ICD-10-CM | POA: Diagnosis not present

## 2019-05-25 NOTE — Progress Notes (Signed)
Patient reported to [facility] for Repetitive Transcranial Magnetic Stimulation treatment for severe episode of recurrent major depressive disorder, without psychotic features. Patient presented with appropriate affect, level mood and denied any suicidal or homicidal ideations. Patient denies any other current symptoms and remains optimistic with continued TMS treatment. Patient reported no change in alcohol/substance use, caffeine consumption, sleep pattern or metal implant status since previous tx. Patient reported to tx session with pleasant affect.Shespoke with writer.Powerwas titrated to120% for the duration of txand will remain there until patient gets adjusted. Patient reported no complaints or discomfort. Patientdeparted post-treatment with no concerns or complaints. 

## 2019-05-26 ENCOUNTER — Encounter (HOSPITAL_COMMUNITY): Payer: Medicare HMO | Admitting: Emergency Medicine

## 2019-05-26 DIAGNOSIS — M545 Low back pain: Secondary | ICD-10-CM | POA: Diagnosis not present

## 2019-05-26 DIAGNOSIS — M5136 Other intervertebral disc degeneration, lumbar region: Secondary | ICD-10-CM | POA: Diagnosis not present

## 2019-05-27 ENCOUNTER — Other Ambulatory Visit: Payer: Self-pay

## 2019-05-27 ENCOUNTER — Other Ambulatory Visit (INDEPENDENT_AMBULATORY_CARE_PROVIDER_SITE_OTHER): Payer: Medicare HMO | Admitting: Emergency Medicine

## 2019-05-27 DIAGNOSIS — F332 Major depressive disorder, recurrent severe without psychotic features: Secondary | ICD-10-CM | POA: Diagnosis not present

## 2019-05-27 NOTE — Progress Notes (Signed)
Patient reported to [facility] for Repetitive Transcranial Magnetic Stimulation treatment for severe episode of recurrent major depressive disorder, without psychotic features. Patient presented with appropriate affect, level mood and denied any suicidal or homicidal ideations. Patient denies any other current symptoms and remains optimistic with continued TMS treatment. Patient reported no change in alcohol/substance use, caffeine consumption, sleep pattern or metal implant status since previous tx. Patient reported to tx session with pleasant affect.Shespoke with writer.Powerwas titrated to120% for the duration of txand will remain there until patient gets adjusted. Patient reported no complaints or discomfort. Patientdeparted post-treatment with no concerns or complaints. 

## 2019-05-30 ENCOUNTER — Other Ambulatory Visit: Payer: Self-pay | Admitting: Family Medicine

## 2019-05-30 DIAGNOSIS — M797 Fibromyalgia: Secondary | ICD-10-CM

## 2019-05-31 ENCOUNTER — Other Ambulatory Visit (INDEPENDENT_AMBULATORY_CARE_PROVIDER_SITE_OTHER): Payer: Medicare HMO | Admitting: Emergency Medicine

## 2019-05-31 ENCOUNTER — Other Ambulatory Visit: Payer: Self-pay

## 2019-05-31 DIAGNOSIS — M5136 Other intervertebral disc degeneration, lumbar region: Secondary | ICD-10-CM | POA: Diagnosis not present

## 2019-05-31 DIAGNOSIS — M545 Low back pain: Secondary | ICD-10-CM | POA: Diagnosis not present

## 2019-05-31 DIAGNOSIS — F332 Major depressive disorder, recurrent severe without psychotic features: Secondary | ICD-10-CM | POA: Diagnosis not present

## 2019-05-31 NOTE — Progress Notes (Signed)
Patient reported to [facility] for Repetitive Transcranial Magnetic Stimulation treatment for severe episode of recurrent major depressive disorder, without psychotic features. Patient presented with appropriate affect, level mood and denied any suicidal or homicidal ideations. Patient denies any other current symptoms and remains optimistic with continued TMS treatment. Patient reported no change in alcohol/substance use, caffeine consumption, sleep pattern or metal implant status since previous tx. Patient reported to tx session with pleasant affect.Shespoke with writer.Powerwas titrated to120% for the duration of txand will remain there until patient gets adjusted. Patient reported no complaints or discomfort. Patientdeparted post-treatment with no concerns or complaints. 

## 2019-06-01 ENCOUNTER — Encounter (HOSPITAL_COMMUNITY): Payer: Self-pay | Admitting: Psychiatry

## 2019-06-01 ENCOUNTER — Ambulatory Visit (INDEPENDENT_AMBULATORY_CARE_PROVIDER_SITE_OTHER): Payer: Medicare HMO | Admitting: Psychiatry

## 2019-06-01 ENCOUNTER — Other Ambulatory Visit (INDEPENDENT_AMBULATORY_CARE_PROVIDER_SITE_OTHER): Payer: Medicare HMO | Admitting: Emergency Medicine

## 2019-06-01 ENCOUNTER — Other Ambulatory Visit: Payer: Self-pay

## 2019-06-01 DIAGNOSIS — F419 Anxiety disorder, unspecified: Secondary | ICD-10-CM

## 2019-06-01 DIAGNOSIS — F332 Major depressive disorder, recurrent severe without psychotic features: Secondary | ICD-10-CM

## 2019-06-01 NOTE — Progress Notes (Signed)
Patient reported to [facility] for Repetitive Transcranial Magnetic Stimulation treatment for severe episode of recurrent major depressive disorder, without psychotic features. Patient presented with appropriate affect, level mood and denied any suicidal or homicidal ideations. Patient denies any other current symptoms and remains optimistic with continued TMS treatment. Patient reported no change in alcohol/substance use, caffeine consumption, sleep pattern or metal implant status since previous tx. Patient reported to tx session with pleasant affect.Shespoke with writer.Powerwas titrated to120% for the duration of txand will remain there until patient gets adjusted. Patient reported no complaints or discomfort. Patientdeparted post-treatment with no concerns or complaints. 

## 2019-06-01 NOTE — Progress Notes (Signed)
Virtual Visit via Video Note  I connected with Tiffany Reese on 06/01/19 at  4:00 PM EDT by a video enabled telemedicine application and verified that I am speaking with the correct person using two identifiers.  Location: Patient: Tiffany Reese Provider: [name], LCSW   I discussed the limitations of evaluation and management by telemedicine and the availability of in person appointments. The patient expressed understanding and agreed to proceed.  History of Present Illness: MDD and Anxiety   Observations/Objective: Counselor met with Tiffany Reese for individual therapy via Webex. Counselor assessed MH symptoms and progress on treatment plan goals. Tiffany Reese denied suicidal ideation or self-harm behaviors. Tiffany Reese shared that she is reaching the completion of [place]. She noted that she is feeling more motivated to tackle tasks and interests. She reports a decrease in anxiety and better ability to be out in public. Counselor assessed daily functioning and connections with natural supports. Tiffany Reese reported that she is more motivated to eat healthy and is thinking more clearly than before. Counselor celebrated these successes with Tiffany Reese through Audiological scientist. Counselor and Tiffany Reese identified tasks she would like to focus on with her new motivation. Counselor challenged negative cognitions and verbalizations about her body, abilities, and self-talk. Starleen shared that she has been thinking a lot more about her divorce and ex-husband, which troubles her because it was so long ago in the past. Counselor encouraged her to talk with Experiment technicians about repressed memories surfacing as well as dreams. Counselor challenged Leola to include deadlines on her to do list for additional motivation. Tiffany Reese responded positively to the session today.   Assessment and Plan: Counselor will continue to meet with patient to address treatment plan goals. Patient will continue to follow recommendations of providers and  implement skills learned in session.  Follow Up Instructions: Counselor will send information for next session via Webex.     I discussed the assessment and treatment plan with the patient. The patient was provided an opportunity to ask questions and all were answered. The patient agreed with the plan and demonstrated an understanding of the instructions.   The patient was advised to call back or seek an in-person evaluation if the symptoms worsen or if the condition fails to improve as anticipated.  I provided 60 minutes of non-face-to-face time during this encounter.   [name], LCSW

## 2019-06-02 ENCOUNTER — Encounter (HOSPITAL_COMMUNITY): Payer: Medicare HMO | Admitting: Emergency Medicine

## 2019-06-02 DIAGNOSIS — M7541 Impingement syndrome of right shoulder: Secondary | ICD-10-CM | POA: Diagnosis not present

## 2019-06-02 DIAGNOSIS — M545 Low back pain: Secondary | ICD-10-CM | POA: Diagnosis not present

## 2019-06-02 DIAGNOSIS — M5136 Other intervertebral disc degeneration, lumbar region: Secondary | ICD-10-CM | POA: Diagnosis not present

## 2019-06-03 ENCOUNTER — Encounter (HOSPITAL_COMMUNITY): Payer: Medicare HMO

## 2019-06-06 ENCOUNTER — Other Ambulatory Visit: Payer: Self-pay

## 2019-06-06 ENCOUNTER — Other Ambulatory Visit (HOSPITAL_COMMUNITY): Payer: Medicare HMO | Admitting: Emergency Medicine

## 2019-06-06 DIAGNOSIS — F332 Major depressive disorder, recurrent severe without psychotic features: Secondary | ICD-10-CM

## 2019-06-06 NOTE — Progress Notes (Signed)
Patient reported to [facility] for Repetitive Transcranial Magnetic Stimulation treatment for severe episode of recurrent major depressive disorder, without psychotic features. Patient presented with appropriate affect, level mood and denied any suicidal or homicidal ideations. Patient denies any other current symptoms and remains optimistic with continued [place] treatment. Patient reported no change in alcohol/substance use, caffeine consumption, sleep pattern or metal implant status since previous tx. Patient reported to tx session with pleasant affect.Shespoke with Probation officer.Powerwas titrated to120% for the duration of txand will remain there until patient gets adjusted. Patient reported no complaints or discomfort. Patientdeparted post-treatment with no concerns or complaints.

## 2019-06-07 ENCOUNTER — Other Ambulatory Visit (INDEPENDENT_AMBULATORY_CARE_PROVIDER_SITE_OTHER): Payer: Medicare HMO | Admitting: Emergency Medicine

## 2019-06-07 ENCOUNTER — Other Ambulatory Visit: Payer: Self-pay

## 2019-06-07 DIAGNOSIS — F332 Major depressive disorder, recurrent severe without psychotic features: Secondary | ICD-10-CM | POA: Diagnosis not present

## 2019-06-07 NOTE — Progress Notes (Signed)
Patient reported to [facility] for Repetitive Transcranial Magnetic Stimulation treatment for severe episode of recurrent major depressive disorder, without psychotic features. Patient presented with appropriate affect, level mood and denied any suicidal or homicidal ideations. Patient denies any other current symptoms and remains optimistic with continued TMS treatment. Patient reported no change in alcohol/substance use, caffeine consumption, sleep pattern or metal implant status since previous tx. Patient reported to tx session with pleasant affect.Shespoke with writer.Powerwas titrated to120% for the duration of txand will remain there until patient gets adjusted. Patient reported no complaints or discomfort. Patientdeparted post-treatment with no concerns or complaints. 

## 2019-06-08 ENCOUNTER — Other Ambulatory Visit: Payer: Self-pay

## 2019-06-08 ENCOUNTER — Ambulatory Visit (INDEPENDENT_AMBULATORY_CARE_PROVIDER_SITE_OTHER): Payer: Medicare HMO | Admitting: Psychiatry

## 2019-06-08 ENCOUNTER — Encounter (HOSPITAL_COMMUNITY): Payer: Self-pay | Admitting: Psychiatry

## 2019-06-08 ENCOUNTER — Other Ambulatory Visit (INDEPENDENT_AMBULATORY_CARE_PROVIDER_SITE_OTHER): Payer: Medicare HMO | Admitting: Emergency Medicine

## 2019-06-08 DIAGNOSIS — F419 Anxiety disorder, unspecified: Secondary | ICD-10-CM

## 2019-06-08 DIAGNOSIS — F4310 Post-traumatic stress disorder, unspecified: Secondary | ICD-10-CM | POA: Diagnosis not present

## 2019-06-08 DIAGNOSIS — F332 Major depressive disorder, recurrent severe without psychotic features: Secondary | ICD-10-CM

## 2019-06-08 NOTE — Progress Notes (Signed)
Patient reported to [facility] for Repetitive Transcranial Magnetic Stimulation treatment for severe episode of recurrent major depressive disorder, without psychotic features. Patient presented with appropriate affect, level mood and denied any suicidal or homicidal ideations. Patient denies any other current symptoms and remains optimistic with continued TMS treatment. Patient reported no change in alcohol/substance use, caffeine consumption, sleep pattern or metal implant status since previous tx. Patient reported to tx session with pleasant affect.Shespoke with writer.Powerwas titrated to120% for the duration of txand will remain there until patient gets adjusted. Patient reported no complaints or discomfort. Patientdeparted post-treatment with no concerns or complaints. 

## 2019-06-08 NOTE — Progress Notes (Signed)
Virtual Visit via Video Note  I connected with Tiffany Reese on 06/08/19 at  2:00 PM EDT by a video enabled telemedicine application and verified that I am speaking with the correct person using two identifiers.  Location: Patient: Tiffany Reese Provider: [name], LCSW   I discussed the limitations of evaluation and management by telemedicine and the availability of in person appointments. The patient expressed understanding and agreed to proceed.  History of Present Illness: MDD, Anxiety and PTSD   Observations/Objective: Counselor met with Tiffany Reese for individual therapy via Webex. Counselor assessed MH symptoms and progress on treatment plan goals. Malik denied suicidal ideation or self-harm behaviors. Ivana shared that she will be wrapping up [place] treatment next week. She reports positive outcomes with increased motivation, being more aware of her negative mindset and thought patterns. She expressed that she is ready to do work on coming to terms with her divorce and would like to do counseling with her adult daughter around improving their relationship. Counselor gave Tiffany Reese homework to start journaling about her thoughts and feelings associated with her divorce and its impact on her life. She expresses that she doesn't want to feel stuck in the "what ifs" anymore. Counselor notes her progress in expressing her awareness and needs. Counselor provided psychoeducation on cognitive distortions and explored these concepts with Tiffany Reese. We will continue work in this area upon next session.    Assessment and Plan: Counselor will continue to meet with patient to address treatment plan goals. Patient will continue to follow recommendations of providers and implement skills learned in session.  Follow Up Instructions: Counselor will send information for next session via Webex.     I discussed the assessment and treatment plan with the patient. The patient was provided an opportunity to ask  questions and all were answered. The patient agreed with the plan and demonstrated an understanding of the instructions.   The patient was advised to call back or seek an in-person evaluation if the symptoms worsen or if the condition fails to improve as anticipated.  I provided 60 minutes of non-face-to-face time during this encounter.   [name], LCSW

## 2019-06-09 ENCOUNTER — Other Ambulatory Visit: Payer: Self-pay

## 2019-06-09 ENCOUNTER — Other Ambulatory Visit (INDEPENDENT_AMBULATORY_CARE_PROVIDER_SITE_OTHER): Payer: Medicare HMO | Admitting: Emergency Medicine

## 2019-06-09 DIAGNOSIS — F332 Major depressive disorder, recurrent severe without psychotic features: Secondary | ICD-10-CM | POA: Diagnosis not present

## 2019-06-09 NOTE — Progress Notes (Signed)
Patient reported to [facility] for Repetitive Transcranial Magnetic Stimulation treatment for severe episode of recurrent major depressive disorder, without psychotic features. Patient presented with appropriate affect, level mood and denied any suicidal or homicidal ideations. Patient denies any other current symptoms and remains optimistic with continued TMS treatment. Patient reported no change in alcohol/substance use, caffeine consumption, sleep pattern or metal implant status since previous tx. Patient reported to tx session with pleasant affect.Shespoke with writer.Powerwas titrated to120% for the duration of txand will remain there until patient gets adjusted. Patient reported no complaints or discomfort. Patientdeparted post-treatment with no concerns or complaints. 

## 2019-06-10 ENCOUNTER — Encounter (HOSPITAL_COMMUNITY): Payer: Medicare HMO

## 2019-06-10 ENCOUNTER — Other Ambulatory Visit: Payer: Self-pay

## 2019-06-10 ENCOUNTER — Other Ambulatory Visit (INDEPENDENT_AMBULATORY_CARE_PROVIDER_SITE_OTHER): Payer: Medicare HMO | Admitting: Emergency Medicine

## 2019-06-10 DIAGNOSIS — M5136 Other intervertebral disc degeneration, lumbar region: Secondary | ICD-10-CM | POA: Diagnosis not present

## 2019-06-10 DIAGNOSIS — F332 Major depressive disorder, recurrent severe without psychotic features: Secondary | ICD-10-CM | POA: Diagnosis not present

## 2019-06-10 NOTE — Progress Notes (Signed)
Patient reported to [facility] for Repetitive Transcranial Magnetic Stimulation treatment for severe episode of recurrent major depressive disorder, without psychotic features. Patient presented with appropriate affect, level mood and denied any suicidal or homicidal ideations. Patient denies any other current symptoms and remains optimistic with continued TMS treatment. Patient reported no change in alcohol/substance use, caffeine consumption, sleep pattern or metal implant status since previous tx. Patient reported to tx session with pleasant affect.Shespoke with writer.Powerwas titrated to120% for the duration of txand will remain there until patient gets adjusted. Patient reported no complaints or discomfort. Patientdeparted post-treatment with no concerns or complaints. 

## 2019-06-14 ENCOUNTER — Encounter (HOSPITAL_COMMUNITY): Payer: Medicare HMO

## 2019-06-15 ENCOUNTER — Encounter (HOSPITAL_COMMUNITY): Payer: Medicare HMO

## 2019-06-16 ENCOUNTER — Encounter (HOSPITAL_COMMUNITY): Payer: Medicare HMO | Admitting: Emergency Medicine

## 2019-06-16 ENCOUNTER — Encounter (HOSPITAL_COMMUNITY): Payer: Medicare HMO

## 2019-06-20 ENCOUNTER — Other Ambulatory Visit: Payer: Self-pay

## 2019-06-20 ENCOUNTER — Other Ambulatory Visit (INDEPENDENT_AMBULATORY_CARE_PROVIDER_SITE_OTHER): Payer: Medicare HMO | Admitting: Emergency Medicine

## 2019-06-20 DIAGNOSIS — F332 Major depressive disorder, recurrent severe without psychotic features: Secondary | ICD-10-CM | POA: Diagnosis not present

## 2019-06-20 NOTE — Progress Notes (Signed)
Patient reported to [facility] for Repetitive Transcranial Magnetic Stimulation treatment for severe episode of recurrent major depressive disorder, without psychotic features. Patient presented with appropriate affect, level mood and denied any suicidal or homicidal ideations. Patient denies any other current symptoms and remains optimistic with continued [place] treatment. Patient reported no change in alcohol/substance use, caffeine consumption, sleep pattern or metal implant status since previous tx. Patient reported to tx session with pleasant affect.Shespoke with Probation officer. She shared that [place] has been helping her tremendously. Today is her last treatment session. Powerwas titrated to120% for the duration of txand will remain there until patient gets adjusted. Patient reported no complaints or discomfort. Patientdeparted post-treatment with no concerns or complaints.

## 2019-06-22 ENCOUNTER — Encounter (HOSPITAL_COMMUNITY): Payer: Self-pay | Admitting: Psychiatry

## 2019-06-22 ENCOUNTER — Other Ambulatory Visit: Payer: Self-pay

## 2019-06-22 ENCOUNTER — Ambulatory Visit (INDEPENDENT_AMBULATORY_CARE_PROVIDER_SITE_OTHER): Payer: Medicare HMO | Admitting: Psychiatry

## 2019-06-22 DIAGNOSIS — F4310 Post-traumatic stress disorder, unspecified: Secondary | ICD-10-CM | POA: Diagnosis not present

## 2019-06-22 DIAGNOSIS — F419 Anxiety disorder, unspecified: Secondary | ICD-10-CM

## 2019-06-22 DIAGNOSIS — F332 Major depressive disorder, recurrent severe without psychotic features: Secondary | ICD-10-CM | POA: Diagnosis not present

## 2019-06-22 NOTE — Progress Notes (Signed)
Virtual Visit via Video Note  I connected with Tiffany Reese on 06/22/19 at  2:00 PM EDT by a video enabled telemedicine application and verified that I am speaking with the correct person using two identifiers.  Location: Patient: Tiffany Reese Provider: [name], LCSW   I discussed the limitations of evaluation and management by telemedicine and the availability of in person appointments. The patient expressed understanding and agreed to proceed.  History of Present Illness: MDD, Anxiety and PTSD   Observations/Objective: Counselor met with Tiffany Reese for individual therapy via Webex. Counselor assessed MH symptoms and progress on treatment plan goals. Tiffany Reese denied suicidal ideation or self-harm behaviors. Tiffany Reese shared that she was currently having dizzy spells and felt light headed. Towards the beginning of the session it appeared that she "spaced out" a couple times. Counselor discussed her safety and health and offered for her to lay down or rest her head until she felt reoriented. Tiffany Reese chose to continue in her seated position and proceed with treatment. Counselor encouraged again for her to communicate her needs throughout the session for her safety and well-being. Kale intends to contact primary care if dizziness continues for a virtual appointment. Counselor reviewed [employer] homework of reflecting and writing down her losses associated with her divorce, which she qualifies as traumatic and life altering. Counselor processed thoughts and feelings around identified losses. Counselor explored and gathered information about details from the experiences mentioned. Counselor and Tiffany Reese identified themes, outcomes, and current impact. Tiffany Reese was able to identify motivators for changing current behaviors associated with her daily functioning and mental health, particularly to repair and regain relationship with her daughter. Counselor to continue to explore list of losses upon next session and  to send her upcoming appointment dates and times.   Assessment and Plan: Counselor will continue to meet with patient to address treatment plan goals. Patient will continue to follow recommendations of providers and implement skills learned in session.  Follow Up Instructions: Counselor will send information for next session via Webex.     I discussed the assessment and treatment plan with the patient. The patient was provided an opportunity to ask questions and all were answered. The patient agreed with the plan and demonstrated an understanding of the instructions.   The patient was advised to call back or seek an in-person evaluation if the symptoms worsen or if the condition fails to improve as anticipated.  I provided 60 minutes of non-face-to-face time during this encounter.   [name], LCSW

## 2019-06-27 ENCOUNTER — Ambulatory Visit (INDEPENDENT_AMBULATORY_CARE_PROVIDER_SITE_OTHER): Payer: Medicare HMO | Admitting: Psychiatry

## 2019-06-27 ENCOUNTER — Other Ambulatory Visit: Payer: Self-pay | Admitting: Family Medicine

## 2019-06-27 ENCOUNTER — Other Ambulatory Visit: Payer: Self-pay

## 2019-06-27 ENCOUNTER — Encounter (HOSPITAL_COMMUNITY): Payer: Self-pay | Admitting: Psychiatry

## 2019-06-27 DIAGNOSIS — F4310 Post-traumatic stress disorder, unspecified: Secondary | ICD-10-CM | POA: Diagnosis not present

## 2019-06-27 DIAGNOSIS — F419 Anxiety disorder, unspecified: Secondary | ICD-10-CM

## 2019-06-27 DIAGNOSIS — F319 Bipolar disorder, unspecified: Secondary | ICD-10-CM

## 2019-06-27 MED ORDER — DIAZEPAM 2 MG PO TABS: ORAL_TABLET | ORAL | 1 refills | Status: DC

## 2019-06-27 MED ORDER — ESCITALOPRAM OXALATE 5 MG PO TABS: 5.0000 mg | ORAL_TABLET | Freq: Every day | ORAL | 1 refills | Status: DC

## 2019-06-27 MED ORDER — LURASIDONE HCL 80 MG PO TABS: 80.0000 mg | ORAL_TABLET | Freq: Every day | ORAL | 1 refills | Status: DC

## 2019-06-27 MED ORDER — AMITRIPTYLINE HCL 150 MG PO TABS: 150.0000 mg | ORAL_TABLET | Freq: Every day | ORAL | 1 refills | Status: DC

## 2019-06-27 NOTE — Telephone Encounter (Signed)
Patient stated that she has been off of this medication for two days now.
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
## 2019-06-27 NOTE — Telephone Encounter (Signed)
Requested medication (s) are due for refill today: yes  Requested medication (s) are on the active medication list: yes  Last refill:  11/26/2018  Future visit scheduled: no  Notes to clinic:  Ordering provider and pcp are different Patient has been out of medication for 2 days   Requested Prescriptions  Pending Prescriptions Disp Refills   levothyroxine (SYNTHROID) 50 MCG tablet [Pharmacy Med Name: LEVOTHYROXINE 50 MCG TABLET] 30 tablet 2    Sig: TAKE 1 TABLET BY MOUTH EVERY DAY     Endocrinology:  Hypothyroid Agents Failed - 06/27/2019  1:44 PM      Failed - TSH needs to be rechecked within 3 months after an abnormal result. Refill until TSH is due.      Passed - TSH in normal range and within 360 days    TSH  Date Value Ref Range Status  11/01/2018 3.23 0.35 - 4.50 uIU/mL Final         Passed - Valid encounter within last 12 months    Recent Outpatient Visits          1 month ago Anemia, unspecified type   Therapist, [facility] at [facility], Steele Berg, MD   2 months ago Lumbar back pain   Therapist, [facility] at [facility], Steele Berg, MD   4 months ago Idiopathic hypotension   Therapist, [facility] at [facility] [facility], Steele Berg, MD   4 months ago Essential hypertension   Therapist, [facility] at [facility], [place], MD   7 months ago Controlled type 2 diabetes mellitus with complication, without long-term current use of insulin ([place])   Therapist, [facility] at [facility], Steele Berg, MD

## 2019-07-01 ENCOUNTER — Other Ambulatory Visit (HOSPITAL_COMMUNITY): Payer: Self-pay | Admitting: Psychiatry

## 2019-07-01 DIAGNOSIS — F319 Bipolar disorder, unspecified: Secondary | ICD-10-CM

## 2019-07-05 ENCOUNTER — Other Ambulatory Visit: Payer: Self-pay

## 2019-07-05 ENCOUNTER — Ambulatory Visit (INDEPENDENT_AMBULATORY_CARE_PROVIDER_SITE_OTHER): Payer: Medicare HMO | Admitting: Psychiatry

## 2019-07-05 ENCOUNTER — Encounter (HOSPITAL_COMMUNITY): Payer: Self-pay | Admitting: Psychiatry

## 2019-07-05 DIAGNOSIS — F332 Major depressive disorder, recurrent severe without psychotic features: Secondary | ICD-10-CM | POA: Diagnosis not present

## 2019-07-05 DIAGNOSIS — F419 Anxiety disorder, unspecified: Secondary | ICD-10-CM

## 2019-07-05 DIAGNOSIS — F4310 Post-traumatic stress disorder, unspecified: Secondary | ICD-10-CM

## 2019-07-05 NOTE — Progress Notes (Signed)
Virtual Visit via Video Note  I connected with Lolita Cram Matton on 07/05/19 at  2:00 PM EDT by a video enabled telemedicine application and verified that I am speaking with the correct person using two identifiers.  Location: Patient: Tiffany Reese Provider: [name] LCSW   I discussed the limitations of evaluation and management by telemedicine and the availability of in person appointments. The patient expressed understanding and agreed to proceed.  History of Present Illness: MDD, PTSD and GAD   Observations/Objective: Counselor met with [name] for individual therapy via Webex. Counselor assessed MH symptoms and progress on treatment plan goals. [name] presented with moderate depression and mild anxiety. Yarisbel denied suicidal ideation or self-harm behaviors. Keigan shared that she recently started a new psychiatric medication and she believes that it has caused an improvement in mood and functioning. She stated that she feels more hopeful about her treatment and progress due to this. Moani reports implementing new healthy habits and sticking with her low carb diet. Counselor and Demitria explored supportive services and supplemental actions she can take to address her morbid obesity. She plans to set an appointment with a nutritionist. She stated she is seeing minimal/slight improvements with her daughter and shared the remaining barriers to repairing that relationship. Monasia would like to continue working on the state of cleanliness in her home to make her daughter feel more comfortable visiting. Counselor and Kelsie continued work on her list of things she lost overall in her divorce. Counselor addressed cognitive distortions and used CBT interventions to explore new perspectives she could take on the losses and her current status in life.   Assessment and Plan: Counselor will continue to meet with patient to address treatment plan goals. Patient will continue to follow recommendations of  providers and implement skills learned in session.  Follow Up Instructions: Counselor will send information for next session via Webex.     I discussed the assessment and treatment plan with the patient. The patient was provided an opportunity to ask questions and all were answered. The patient agreed with the plan and demonstrated an understanding of the instructions.   The patient was advised to call back or seek an in-person evaluation if the symptoms worsen or if the condition fails to improve as anticipated.  I provided 60 minutes of non-face-to-face time during this encounter.   [name], LCSW

## 2019-07-12 ENCOUNTER — Other Ambulatory Visit: Payer: Self-pay

## 2019-07-12 ENCOUNTER — Ambulatory Visit (HOSPITAL_COMMUNITY): Payer: Medicare HMO | Admitting: Psychiatry

## 2019-07-12 DIAGNOSIS — F419 Anxiety disorder, unspecified: Secondary | ICD-10-CM

## 2019-07-12 DIAGNOSIS — F4310 Post-traumatic stress disorder, unspecified: Secondary | ICD-10-CM

## 2019-07-12 DIAGNOSIS — F319 Bipolar disorder, unspecified: Secondary | ICD-10-CM

## 2019-07-12 NOTE — Progress Notes (Signed)
Virtual Visit via Video Note  I connected with Lolita Cram Warriner on 07/12/19 at  3:00 PM EDT by a video enabled telemedicine application and verified that I am speaking with the correct person using two identifiers.  Location: Patient: Tiffany Reese Provider: [name], LCSW   I discussed the limitations of evaluation and management by telemedicine and the availability of in person appointments. The patient expressed understanding and agreed to proceed.  History of Present Illness: Anxiety, PTSD and Bipolar 1 DO   Observations/Objective: Counselor met with [name] for individual therapy via Webex. Counselor assessed MH symptoms and progress on treatment plan goals. Geralene denied suicidal ideation or self-harm behaviors. Tmya shared that over the past week she has experienced minimal depression compared to the majority of her adult life. Alexyss was excited to share this progress, while also concerned that now her anxiety has become more prominent. She states that she is living more in the present and becoming more concerned with the future, as oppose to dwelling in the past. Counselor explored her thoughts and feelings regarding this change in symptom presentation. Counselor assessed her daily functioning. Counselor and [name] walked through her daily routine to tweak her activity to address the anxious thoughts due to the state of her home and lack of movement she currently engages in.Lee identified that her motivators for implementing these changes is to improve her relationship with her daughter and to improve the well-being and health of her pet dog, Mimi. We identified obstacles and challenges to eliminate excuses and barriers. Aariel established longer-term goals to work towards as motivation as well. Counselor sent resources and a schedule template for more concrete support.  Assessment and Plan: Counselor will continue to meet with patient to address treatment plan goals. Patient will  continue to follow recommendations of providers and implement skills learned in session.  Follow Up Instructions: Counselor will send information for next session via Webex.     I discussed the assessment and treatment plan with the patient. The patient was provided an opportunity to ask questions and all were answered. The patient agreed with the plan and demonstrated an understanding of the instructions.   The patient was advised to call back or seek an in-person evaluation if the symptoms worsen or if the condition fails to improve as anticipated.  I provided 60 minutes of non-face-to-face time during this encounter.   [name], LCSW

## 2019-07-14 ENCOUNTER — Encounter (HOSPITAL_COMMUNITY): Payer: Self-pay | Admitting: Psychiatry

## 2019-07-18 DIAGNOSIS — H5010 Unspecified exotropia: Secondary | ICD-10-CM | POA: Diagnosis not present

## 2019-07-18 DIAGNOSIS — H2513 Age-related nuclear cataract, bilateral: Secondary | ICD-10-CM | POA: Diagnosis not present

## 2019-07-18 DIAGNOSIS — E119 Type 2 diabetes mellitus without complications: Secondary | ICD-10-CM | POA: Diagnosis not present

## 2019-07-18 LAB — HM DIABETES EYE EXAM

## 2019-07-19 ENCOUNTER — Encounter (HOSPITAL_COMMUNITY): Payer: Self-pay | Admitting: Psychiatry

## 2019-07-19 ENCOUNTER — Other Ambulatory Visit: Payer: Self-pay

## 2019-07-19 ENCOUNTER — Ambulatory Visit (INDEPENDENT_AMBULATORY_CARE_PROVIDER_SITE_OTHER): Payer: Medicare HMO | Admitting: Psychiatry

## 2019-07-19 DIAGNOSIS — F419 Anxiety disorder, unspecified: Secondary | ICD-10-CM | POA: Diagnosis not present

## 2019-07-19 NOTE — Progress Notes (Signed)
Virtual Visit via Video Note  I connected with Lolita Cram Desrochers on 07/19/19 at  2:00 PM EDT by a video enabled telemedicine application and verified that I am speaking with the correct person using two identifiers.  Location: Patient: Tiffany Reese Provider: [name], LCSW   I discussed the limitations of evaluation and management by telemedicine and the availability of in person appointments. The patient expressed understanding and agreed to proceed.  History of Present Illness: Anxiety   Observations/Objective: Counselor met with [name] for individual therapy via Webex. Counselor assessed MH symptoms and progress on treatment plan goals. [name] presented with mild depression and moderate anxiety. Eugenia denied suicidal ideation or self-harm behaviors. Kwynn shared that can feel her depression lifting, which in turn is causing her anxiety to increase, because she is more aware of the things she has been neglecting while depressed. Counselor provided psychoeducation on the therapeutic process and mental health disorders. Counselor checked in on her homework to determine progress. Caralina was able to identify a variety of household and self-care activities that she started or engaged in over the past week. Counselor highlighted and celebrated her progress. Shadiyah was able to identify 3 positive statements about herself. Arali stated that she would like to contact her psychiatrist to tweak medications to increase improvement in behaviors and mood. There are also 2 other providers she identified as needing to follow up with to improve overall well-being. Counselor and Alencia identified new challenges for the week to build on her progress from last week. Amir was proud of herself and the work she is doing.   Assessment and Plan: Counselor will continue to meet with patient to address treatment plan goals. Patient will continue to follow recommendations of providers and implement skills learned in  session.  Follow Up Instructions: Counselor will send information for next session via Webex.     I discussed the assessment and treatment plan with the patient. The patient was provided an opportunity to ask questions and all were answered. The patient agreed with the plan and demonstrated an understanding of the instructions.   The patient was advised to call back or seek an in-person evaluation if the symptoms worsen or if the condition fails to improve as anticipated.  I provided 55 minutes of non-face-to-face time during this encounter.   [name], LCSW

## 2019-07-20 ENCOUNTER — Other Ambulatory Visit: Payer: Self-pay | Admitting: Family Medicine

## 2019-07-20 ENCOUNTER — Telehealth (HOSPITAL_COMMUNITY): Payer: Self-pay

## 2019-07-20 ENCOUNTER — Other Ambulatory Visit (HOSPITAL_COMMUNITY): Payer: Self-pay | Admitting: Psychiatry

## 2019-07-20 DIAGNOSIS — F319 Bipolar disorder, unspecified: Secondary | ICD-10-CM

## 2019-07-20 DIAGNOSIS — F4310 Post-traumatic stress disorder, unspecified: Secondary | ICD-10-CM

## 2019-07-20 DIAGNOSIS — F419 Anxiety disorder, unspecified: Secondary | ICD-10-CM

## 2019-07-20 MED ORDER — ESCITALOPRAM OXALATE 10 MG PO TABS: 10.0000 mg | ORAL_TABLET | Freq: Every day | ORAL | 0 refills | Status: DC

## 2019-07-20 NOTE — Telephone Encounter (Signed)
Patient called and stated that she's doing well  on her Lexapro 5mg  but feels like she would benefit more on an increased dosage. She stated that she also spoke with [facility] about it. Please review and advise. Thank you.

## 2019-07-20 NOTE — Telephone Encounter (Signed)
She can try Lexapro 10 mg but usually she is not able to tolerate SSRIs.  If she started increased dose of Lexapro causing irritability, tremors, shakes or any manic-like symptoms then she need to go back on 5 mg.  I called a 30-day supply of Lexapro 10 mg to her local pharmacy.

## 2019-07-22 NOTE — Telephone Encounter (Signed)
Notified patient and relayed message.

## 2019-07-26 ENCOUNTER — Encounter: Payer: Self-pay | Admitting: Family Medicine

## 2019-08-03 ENCOUNTER — Ambulatory Visit (INDEPENDENT_AMBULATORY_CARE_PROVIDER_SITE_OTHER): Payer: Medicare HMO | Admitting: Psychiatry

## 2019-08-03 ENCOUNTER — Encounter (HOSPITAL_COMMUNITY): Payer: Self-pay | Admitting: Psychiatry

## 2019-08-03 ENCOUNTER — Other Ambulatory Visit: Payer: Self-pay

## 2019-08-03 DIAGNOSIS — F4310 Post-traumatic stress disorder, unspecified: Secondary | ICD-10-CM | POA: Diagnosis not present

## 2019-08-03 DIAGNOSIS — F419 Anxiety disorder, unspecified: Secondary | ICD-10-CM

## 2019-08-03 NOTE — Progress Notes (Signed)
Virtual Visit via Video Note  I connected with Tiffany Reese on 08/03/19 at  1:00 PM EST by a video enabled telemedicine application and verified that I am speaking with the correct person using two identifiers.  Location: Patient: Tiffany Reese Provider: [name], LCSW   I discussed the limitations of evaluation and management by telemedicine and the availability of in person appointments. The patient expressed understanding and agreed to proceed.  History of Present Illness: MDD and PTSD   Observations/Objective: Counselor met with Tiffany Reese for individual therapy via Webex. Counselor assessed MH symptoms and progress on treatment plan goals. Tiffany Reese presented with moderate anxiety and severe depression. Tiffany Reese denied suicidal ideation or self-harm behaviors. Tiffany Reese shared that she was not feeling well today due to body pains and a headache. She stated that her depression has increased, as well as her anxiety over the past week. Counselor and Tiffany Reese explored various facets of her life that are impacting the increased symptoms. Tiffany Reese was able to identify several contributing factors. Counselor worked towards solution focused action plan to address and combat symptoms. Tiffany Reese made note of the things in her control to change and address. Tiffany Reese identified motivators to accomplish her goals. Counselor provided praise and encouragement.    Assessment and Plan: Counselor will continue to meet with patient to address treatment plan goals. Patient will continue to follow recommendations of providers and implement skills learned in session.  Follow Up Instructions: Counselor will send information for next session via Webex.     I discussed the assessment and treatment plan with the patient. The patient was provided an opportunity to ask questions and all were answered. The patient agreed with the plan and demonstrated an understanding of the instructions.   The patient was advised to call back  or seek an in-person evaluation if the symptoms worsen or if the condition fails to improve as anticipated.  I provided 45 minutes of non-face-to-face time during this encounter.   [name], LCSW

## 2019-08-11 ENCOUNTER — Other Ambulatory Visit: Payer: Self-pay | Admitting: Family Medicine

## 2019-08-11 DIAGNOSIS — M5136 Other intervertebral disc degeneration, lumbar region: Secondary | ICD-10-CM | POA: Diagnosis not present

## 2019-08-11 DIAGNOSIS — M7541 Impingement syndrome of right shoulder: Secondary | ICD-10-CM | POA: Diagnosis not present

## 2019-08-11 NOTE — Telephone Encounter (Signed)
She is due for visit with me for follow up.   Please see how often she is taking the methocarbamol. I have not previously written this for her, and this can be a very sedating medication. I want to make sure this is really needed and not contribute to her being dizzy, drowsy, or increased fall risk.

## 2019-08-12 NOTE — Telephone Encounter (Signed)
Left a message for the pt to return a call to the office.  CRM also created. 

## 2019-08-16 ENCOUNTER — Other Ambulatory Visit (HOSPITAL_COMMUNITY): Payer: Self-pay | Admitting: Psychiatry

## 2019-08-16 DIAGNOSIS — F4310 Post-traumatic stress disorder, unspecified: Secondary | ICD-10-CM

## 2019-08-16 DIAGNOSIS — F319 Bipolar disorder, unspecified: Secondary | ICD-10-CM

## 2019-08-16 DIAGNOSIS — F419 Anxiety disorder, unspecified: Secondary | ICD-10-CM

## 2019-08-22 ENCOUNTER — Ambulatory Visit (INDEPENDENT_AMBULATORY_CARE_PROVIDER_SITE_OTHER): Payer: Medicare HMO | Admitting: Psychiatry

## 2019-08-22 ENCOUNTER — Other Ambulatory Visit: Payer: Self-pay

## 2019-08-22 DIAGNOSIS — F4310 Post-traumatic stress disorder, unspecified: Secondary | ICD-10-CM

## 2019-08-22 DIAGNOSIS — F419 Anxiety disorder, unspecified: Secondary | ICD-10-CM | POA: Diagnosis not present

## 2019-08-23 ENCOUNTER — Encounter (HOSPITAL_COMMUNITY): Payer: Self-pay | Admitting: Psychiatry

## 2019-08-23 NOTE — Progress Notes (Signed)
Virtual Visit via Video Note  I connected with Tiffany Reese on 08/23/19 at  2:00 PM EST by a video enabled telemedicine application and verified that I am speaking with the correct person using two identifiers.  Location: Patient: Tiffany Reese Provider: [name], LCSW   I discussed the limitations of evaluation and management by telemedicine and the availability of in person appointments. The patient expressed understanding and agreed to proceed.  History of Present Illness: Anxiety and PTSD   Observations/Objective: Counselor met with Tiffany Reese for individual therapy via Webex. Counselor assessed MH symptoms and progress on treatment plan goals. Tiffany Reese presents with moderate depression and high anxiety. Tiffany Reese denied suicidal ideation or self-harm behaviors. Tiffany Reese shared that her anxiety has increased over the past 2 weeks including intrusive thoughts of shame and guilt related to her current functioning. Counselor assessed daily functioning. Counselor and Tiffany Reese focused on task oriented coping strategies to address feelings of inadequacy. Counselor explored her motivators to accomplish goals. Tiffany Reese was able to identify several and shared positive reports regarding her relationship with her daughter, son in law and granddaughter. She is feeling more hopeful about repairing those relationships and having more future interactions. Tiffany Reese identified barriers in her progress in relation to her PTSD symptoms. Counselor provided trauma-informed/CBT interventions to address barriers identified. Counselor provided Tiffany Reese with homework and tasks to attempt between now and the next session.   Assessment and Plan: Counselor will continue to meet with patient to address treatment plan goals. Patient will continue to follow recommendations of providers and implement skills learned in session. Tiffany Reese expressed motivation to work on elements.   Follow Up Instructions: Counselor will send information for  next session via Webex.     I discussed the assessment and treatment plan with the patient. The patient was provided an opportunity to ask questions and all were answered. The patient agreed with the plan and demonstrated an understanding of the instructions.   The patient was advised to call back or seek an in-person evaluation if the symptoms worsen or if the condition fails to improve as anticipated.  I provided 60 minutes of non-face-to-face time during this encounter.   [name], LCSW

## 2019-08-29 ENCOUNTER — Other Ambulatory Visit: Payer: Self-pay | Admitting: Family Medicine

## 2019-08-29 ENCOUNTER — Other Ambulatory Visit: Payer: Self-pay

## 2019-08-29 ENCOUNTER — Encounter (HOSPITAL_COMMUNITY): Payer: Self-pay | Admitting: Psychiatry

## 2019-08-29 ENCOUNTER — Ambulatory Visit (INDEPENDENT_AMBULATORY_CARE_PROVIDER_SITE_OTHER): Payer: Medicare HMO | Admitting: Psychiatry

## 2019-08-29 DIAGNOSIS — F419 Anxiety disorder, unspecified: Secondary | ICD-10-CM | POA: Diagnosis not present

## 2019-08-29 DIAGNOSIS — F4310 Post-traumatic stress disorder, unspecified: Secondary | ICD-10-CM

## 2019-08-29 DIAGNOSIS — M797 Fibromyalgia: Secondary | ICD-10-CM

## 2019-08-29 DIAGNOSIS — F319 Bipolar disorder, unspecified: Secondary | ICD-10-CM | POA: Diagnosis not present

## 2019-08-29 MED ORDER — LURASIDONE HCL 80 MG PO TABS: 80.0000 mg | ORAL_TABLET | Freq: Every day | ORAL | 1 refills | Status: DC

## 2019-08-29 MED ORDER — OMEPRAZOLE 40 MG PO CPDR: 40.0000 mg | DELAYED_RELEASE_CAPSULE | Freq: Every day | ORAL | 0 refills | Status: DC

## 2019-08-29 MED ORDER — AMITRIPTYLINE HCL 150 MG PO TABS: 150.0000 mg | ORAL_TABLET | Freq: Every day | ORAL | 1 refills | Status: DC

## 2019-08-29 MED ORDER — DIAZEPAM 2 MG PO TABS: ORAL_TABLET | ORAL | 1 refills | Status: DC

## 2019-08-29 NOTE — Telephone Encounter (Signed)
Medication Refill - Medication: omeprazole (PRILOSEC) 40 MG capsule    Preferred Pharmacy (with phone number or street name):  [id number] [address], [place] [phone] (Phone) [phone] (Fax)     Agent: Please be advised that RX refills may take up to 3 business days. We ask that you follow-up with your pharmacy.
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
## 2019-08-29 NOTE — Telephone Encounter (Signed)
Patient called to check the status of her medication refill.  She stated that she is all out of her Lyrica medication.  Please advise

## 2019-08-29 NOTE — Telephone Encounter (Signed)
Patient states she would like pregabalin (LYRICA) 50 MG capsule sent to pharmacy as soon as possible, patient would like to pick up all her medications up at the same time. Patient stated she's completely out and would like a follow up call today regarding the status.     [id number] [place], [place] - [address]

## 2019-08-30 ENCOUNTER — Ambulatory Visit (INDEPENDENT_AMBULATORY_CARE_PROVIDER_SITE_OTHER): Payer: Medicare HMO | Admitting: Psychiatry

## 2019-08-30 ENCOUNTER — Other Ambulatory Visit: Payer: Self-pay

## 2019-08-30 DIAGNOSIS — F4310 Post-traumatic stress disorder, unspecified: Secondary | ICD-10-CM | POA: Diagnosis not present

## 2019-08-30 DIAGNOSIS — F319 Bipolar disorder, unspecified: Secondary | ICD-10-CM

## 2019-08-30 DIAGNOSIS — F419 Anxiety disorder, unspecified: Secondary | ICD-10-CM | POA: Diagnosis not present

## 2019-08-30 NOTE — Progress Notes (Signed)
Virtual Visit via Video Note  I connected with Tiffany Reese on 08/30/19 at  3:00 PM EST by a video enabled telemedicine application and verified that I am speaking with the correct person using two identifiers.  Location: Patient: Tiffany Reese Provider: [name], LCSW   I discussed the limitations of evaluation and management by telemedicine and the availability of in person appointments. The patient expressed understanding and agreed to proceed.  History of Present Illness: Anxiety, Bipolar 1 DO and PTSD   Observations/Objective: Counselor met with Tiffany Reese for individual therapy via Webex. Counselor assessed MH symptoms and progress on treatment plan goals. Tiffany Reese presents with high anxiety and mild depression. Tiffany Reese denied suicidal ideation or self-harm behaviors. Tiffany Reese shared that she is currently experiencing a headache, but wanted to continue with therapy. Tiffany Reese reported increased stressors related to the health of her pet, causing her lack of sleep and lowered motivation in completion of treatment plan goals. Counselor processed daily functioning and promoted healthy sleep hygiene and routine. Tiffany Reese shared about medication changes and noted positive outcome in changes thus far. Counselor provided psychotherapeutic interventions to develop a plan to manage [facility] mental health as she travels with friends over the weekend. Tiffany Reese noted increased anxiety about potential challenges, changes and considerations. Tiffany Reese identified potential use of meditation, deep breathing, and doing tension and muscle relaxation exercises to manage symptoms.   Assessment and Plan: Counselor will continue to meet with patient to address treatment plan goals. Patient will continue to follow recommendations of providers and implement skills learned in session.  Follow Up Instructions: Counselor will send information for next session via Webex.     I discussed the assessment and treatment plan with  the patient. The patient was provided an opportunity to ask questions and all were answered. The patient agreed with the plan and demonstrated an understanding of the instructions.   The patient was advised to call back or seek an in-person evaluation if the symptoms worsen or if the condition fails to improve as anticipated.  I provided 55 minutes of non-face-to-face time during this encounter.   [name], LCSW

## 2019-08-31 ENCOUNTER — Encounter (HOSPITAL_COMMUNITY): Payer: Self-pay | Admitting: Psychiatry

## 2019-09-06 ENCOUNTER — Ambulatory Visit (INDEPENDENT_AMBULATORY_CARE_PROVIDER_SITE_OTHER): Payer: Medicare HMO | Admitting: Psychiatry

## 2019-09-06 ENCOUNTER — Other Ambulatory Visit: Payer: Self-pay

## 2019-09-06 DIAGNOSIS — F319 Bipolar disorder, unspecified: Secondary | ICD-10-CM | POA: Diagnosis not present

## 2019-09-06 DIAGNOSIS — F4310 Post-traumatic stress disorder, unspecified: Secondary | ICD-10-CM | POA: Diagnosis not present
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
## 2019-09-07 ENCOUNTER — Encounter (HOSPITAL_COMMUNITY): Payer: Self-pay | Admitting: Psychiatry

## 2019-09-08 ENCOUNTER — Other Ambulatory Visit: Payer: Self-pay | Admitting: Family Medicine

## 2019-09-09 ENCOUNTER — Other Ambulatory Visit: Payer: Self-pay | Admitting: Family Medicine

## 2019-09-09 DIAGNOSIS — M25572 Pain in left ankle and joints of left foot: Secondary | ICD-10-CM

## 2019-09-20 DIAGNOSIS — M5136 Other intervertebral disc degeneration, lumbar region: Secondary | ICD-10-CM | POA: Diagnosis not present

## 2019-09-29 ENCOUNTER — Encounter (HOSPITAL_COMMUNITY): Payer: Self-pay | Admitting: Psychiatry

## 2019-09-29 ENCOUNTER — Other Ambulatory Visit: Payer: Self-pay

## 2019-09-29 ENCOUNTER — Ambulatory Visit (INDEPENDENT_AMBULATORY_CARE_PROVIDER_SITE_OTHER): Payer: Medicare PPO | Admitting: Psychiatry

## 2019-09-29 DIAGNOSIS — F419 Anxiety disorder, unspecified: Secondary | ICD-10-CM | POA: Diagnosis not present

## 2019-09-29 DIAGNOSIS — F4310 Post-traumatic stress disorder, unspecified: Secondary | ICD-10-CM | POA: Diagnosis not present

## 2019-09-29 DIAGNOSIS — F319 Bipolar disorder, unspecified: Secondary | ICD-10-CM | POA: Diagnosis not present

## 2019-09-29 NOTE — Progress Notes (Signed)
Virtual Visit via Video Note  I connected with Carolan Avedisian Mercer on 09/29/19 at  1:00 PM EST by a video enabled telemedicine application and verified that I am speaking with the correct person using two identifiers.  Location: Patient: Tiffany Reese Provider: [name], LCSW   I discussed the limitations of evaluation and management by telemedicine and the availability of in person appointments. The patient expressed understanding and agreed to proceed.  History of Present Illness: Bipolar 1 DO, PTSD and Anxiety   Observations/Objective: Counselor met with [name] for individual therapy via Webex. Counselor assessed MH symptoms and progress on treatment plan goals. Lakecia presents with high anxiety and moderate depression. Shirla denied suicidal ideation or self-harm behaviors. Brittyn shared that she had a successful and meaningful time with her daughter, son-in-law and granddaughter over the holidays. Counselor processed thoughts and feelings regarding grief and loss in her relationship with her daughter and a new hope she has for their connection. Counselor and Ferrell discussed needs as we introduce her daughter to treatment in the next several sessions. We identified "stuck spots", themes, hurts, concerns, and strengths. Counselor discussed the family therapy process and what to expect. Harbour described concerns with her increasing anxiety and chronic depression, no energy, no drive, fears, having the desire/want without the action, isolation, "zoning out", etc. Counselor and Araeya identified coping strategies, self care and additional supports to implement to combat depression and anxiety.   Assessment and Plan: Counselor will continue to meet with patient to address treatment plan goals. Patient will continue to follow recommendations of providers and implement skills learned in session.  Follow Up Instructions: Counselor will send information for next session via Webex.     I discussed  the assessment and treatment plan with the patient. The patient was provided an opportunity to ask questions and all were answered. The patient agreed with the plan and demonstrated an understanding of the instructions.   The patient was advised to call back or seek an in-person evaluation if the symptoms worsen or if the condition fails to improve as anticipated.  I provided 60 minutes of non-face-to-face time during this encounter.   [name], LCSW

## 2019-10-05 ENCOUNTER — Other Ambulatory Visit (HOSPITAL_COMMUNITY): Payer: Self-pay | Admitting: Psychiatry

## 2019-10-05 DIAGNOSIS — F419 Anxiety disorder, unspecified: Secondary | ICD-10-CM

## 2019-10-05 DIAGNOSIS — F4310 Post-traumatic stress disorder, unspecified: Secondary | ICD-10-CM

## 2019-10-05 DIAGNOSIS — F319 Bipolar disorder, unspecified: Secondary | ICD-10-CM

## 2019-10-06 ENCOUNTER — Encounter (HOSPITAL_COMMUNITY): Payer: Self-pay | Admitting: Psychiatry

## 2019-10-06 ENCOUNTER — Ambulatory Visit (INDEPENDENT_AMBULATORY_CARE_PROVIDER_SITE_OTHER): Payer: Medicare PPO | Admitting: Psychiatry

## 2019-10-06 ENCOUNTER — Other Ambulatory Visit: Payer: Self-pay

## 2019-10-06 DIAGNOSIS — F419 Anxiety disorder, unspecified: Secondary | ICD-10-CM | POA: Diagnosis not present

## 2019-10-06 DIAGNOSIS — F319 Bipolar disorder, unspecified: Secondary | ICD-10-CM

## 2019-10-06 DIAGNOSIS — F4310 Post-traumatic stress disorder, unspecified: Secondary | ICD-10-CM

## 2019-10-06 NOTE — Progress Notes (Signed)
Virtual Visit via Video Note  I connected with Ellerie Arenz Posthumus on 10/06/19 at  1:00 PM EST by a video enabled telemedicine application and verified that I am speaking with the correct person using two identifiers.  Location: Patient: Tiffany Reese Provider: [name], LCSW   I discussed the limitations of evaluation and management by telemedicine and the availability of in person appointments. The patient expressed understanding and agreed to proceed.  History of Present Illness: MDD, GAD and PTSD   Observations/Objective: Counselor met with Tiffany Reese and her Daughter, Tiffany Reese for family therapy via Webex. Counselor assessed MH symptoms and progress on treatment plan goals. Tiffany Reese presents with moderate to severe depression and moderate anxiety. Kyleena denied suicidal ideation or self-harm behaviors. Counselor welcomed Tiffany Reese to the session, outlining purpose, goals and structure of the session. Counselor highlighted identified needs of Zykerria discussed in previous session and prompted Tiffany Reese to share her input on observable progress, current concerns, and ability to become part of [facility] treatment process. Counselor and [employer] with [employer] on observations from her perspective. Tiffany Reese identified that she doesn't understand her mother's mental health/lack of functioning and is willing to learn more about it to have more empathy and capacity to care. Counselor mediated a discussion around needs and boundaries, as well as local resources they can access to work towards goals outside of sessions. Counselor thanked Tiffany Reese for her involvement in the session and will follow up in two weeks on progress. Ilham to contact local resources and start addressing needs more directly.   Assessment and Plan: Counselor will continue to meet with patient to address treatment plan goals. Patient will continue to follow recommendations of providers and implement skills  learned in session.  Follow Up Instructions: Counselor will send information for next session via Webex.     I discussed the assessment and treatment plan with the patient. The patient was provided an opportunity to ask questions and all were answered. The patient agreed with the plan and demonstrated an understanding of the instructions.   The patient was advised to call back or seek an in-person evaluation if the symptoms worsen or if the condition fails to improve as anticipated.  I provided 60 minutes of non-face-to-face time during this encounter.   [name], LCSW

## 2019-10-13 ENCOUNTER — Other Ambulatory Visit: Payer: Self-pay

## 2019-10-13 ENCOUNTER — Encounter (HOSPITAL_COMMUNITY): Payer: Self-pay | Admitting: Psychiatry

## 2019-10-13 ENCOUNTER — Ambulatory Visit (INDEPENDENT_AMBULATORY_CARE_PROVIDER_SITE_OTHER): Payer: Medicare PPO | Admitting: Psychiatry

## 2019-10-13 DIAGNOSIS — F319 Bipolar disorder, unspecified: Secondary | ICD-10-CM | POA: Diagnosis not present

## 2019-10-13 DIAGNOSIS — F4310 Post-traumatic stress disorder, unspecified: Secondary | ICD-10-CM | POA: Diagnosis not present

## 2019-10-13 NOTE — Progress Notes (Signed)
Virtual Visit via Video Note  I connected with Tiffany Reese on 10/13/19 at  1:00 PM EST by a video enabled telemedicine application and verified that I am speaking with the correct person using two identifiers.  Location: Patient: Patient Home Provider: Home Office   I discussed the limitations of evaluation and management by telemedicine and the availability of in person appointments. The patient expressed understanding and agreed to proceed.  History of Present Illness: Bipolar 1 and PTSD   Treatment Plan Goals: Tiffany Reese wants to better manage her depression and anxiety symptoms by working through trauma history and developing healhy coping strategies.   Observations/Objective: Counselor met with Tiffany Reese for individual therapy via Webex. Counselor assessed MH symptoms and progress on treatment plan goals, with patient reporting having less trauma reminders and triggers since our last session. Tiffany Reese presents with moderate depression and moderate  anxiety.  Tiffany Reese denied suicidal ideation or self-harm behaviors.   Tiffany Reese and Counselor processed thoughts, feelings and reflections about the last session, which included the participation of her daughter. Tiffany Reese stated that it was productive and provided a since of relief and valadation, however, they had not followed through with homework of increased communication, as they only connected once briefly. Counselor and Tiffany Reese that were in her control which includes more attempts of regular communications just to check in, monthly face to face time, attempting to meet in public places and adhering to boundaries set by daughter.Counselor and Tiffany Reese discussed and identified personal goals of completing dental work, starting a checking account, cooking more meals at home and taking better care of her home and hygiene. Tiffany Reese reported being on board with addressing identified needs.  Assessment and Plan: Counselor will continue to meet with patient  to address treatment plan goals. Patient will continue to follow recommendations of providers and implement skills learned in session.  Follow Up Instructions: Counselor will send information for next session via Webex.    The patient was advised to call back or seek an in-person evaluation if the symptoms worsen or if the condition fails to improve as anticipated.  I provided 55 minutes of non-face-to-face time during this encounter.   [name], LCSW

## 2019-10-16 ENCOUNTER — Other Ambulatory Visit: Payer: Self-pay | Admitting: Family Medicine

## 2019-10-20 ENCOUNTER — Encounter (HOSPITAL_COMMUNITY): Payer: Self-pay | Admitting: Psychiatry

## 2019-10-20 ENCOUNTER — Ambulatory Visit (INDEPENDENT_AMBULATORY_CARE_PROVIDER_SITE_OTHER): Payer: Medicare PPO | Admitting: Psychiatry

## 2019-10-20 ENCOUNTER — Other Ambulatory Visit: Payer: Self-pay

## 2019-10-20 DIAGNOSIS — F319 Bipolar disorder, unspecified: Secondary | ICD-10-CM | POA: Diagnosis not present

## 2019-10-20 DIAGNOSIS — F4310 Post-traumatic stress disorder, unspecified: Secondary | ICD-10-CM | POA: Diagnosis not present

## 2019-10-20 NOTE — Progress Notes (Signed)
Virtual Visit via Video Note  I connected with Tiffany Reese on 10/20/19 at  1:00 PM EST by a video enabled telemedicine application and verified that I am speaking with the correct person using two identifiers.  Location: Patient: Patient Home Provider: Home Office   I discussed the limitations of evaluation and management by telemedicine and the availability of in person appointments. The patient expressed understanding and agreed to proceed.  History of Present Illness: Bipolar 1 and PTSD   Treatment Plan Goals: Tiffany Reese wants to better manage her depression and anxiety symptoms by working through trauma history and developing healthy coping strategies.   Observations/Objective: Counselor met with Tiffany Reese for individual therapy via Webex. Counselor assessed MH symptoms and progress on treatment plan goals, with patient reporting improvement in depression, with more productivity in her day. Tiffany Reese presents with moderate depression and moderate anxiety. Tiffany Reese denied suicidal ideation or self-harm behaviors.   Counselor discussed discharge planning with Tiffany Reese to solidify a transition to new therapist while Counselor is on leave. Tiffany Reese will be set up for appointments with Maurice Small through [facility] to continue work on treatment plan goals.   Counselor and Tiffany Reese processed trauma related concerns that has arose since the session with her daughter. Tiffany Reese discussed ongoing impacts of "dysfunctional family" living and interactions. Counselor used CBT processing to evaluate the parallels in her relationship with her daughter and the relationship she had with her mother. Tiffany Reese reported attempts to connect with her daughter in a healthier manner to rebuild relationship, vs causing her daughter to put up walls.  Tiffany Reese shared about high anxiety experience in relation to meeting her goal of getting dental work completed. Counselor processed thoughts, feelings and actions, with Tiffany Reese working to identify  positive cognitive coping strategies in overcoming the barriers to treatment. Tiffany Reese reported feeling more confident in outlook on situation.   Assessment and Plan: Counselor will continue to meet with patient to address treatment plan goals. Patient will continue to follow recommendations of providers and implement skills learned in session.  Follow Up Instructions: Counselor will send information for next session via Webex.    The patient was advised to call back or seek an in-person evaluation if the symptoms worsen or if the condition fails to improve as anticipated.  I provided 55 minutes of non-face-to-face time during this encounter.   [name], LCSW

## 2019-10-24 ENCOUNTER — Other Ambulatory Visit (HOSPITAL_COMMUNITY): Payer: Self-pay | Admitting: Psychiatry

## 2019-10-24 DIAGNOSIS — F4310 Post-traumatic stress disorder, unspecified: Secondary | ICD-10-CM

## 2019-10-24 DIAGNOSIS — F419 Anxiety disorder, unspecified: Secondary | ICD-10-CM

## 2019-11-02 ENCOUNTER — Other Ambulatory Visit: Payer: Self-pay

## 2019-11-02 ENCOUNTER — Encounter (HOSPITAL_COMMUNITY): Payer: Self-pay | Admitting: Psychiatry

## 2019-11-02 ENCOUNTER — Ambulatory Visit (INDEPENDENT_AMBULATORY_CARE_PROVIDER_SITE_OTHER): Payer: Medicare PPO | Admitting: Psychiatry

## 2019-11-02 DIAGNOSIS — F319 Bipolar disorder, unspecified: Secondary | ICD-10-CM

## 2019-11-02 DIAGNOSIS — F4310 Post-traumatic stress disorder, unspecified: Secondary | ICD-10-CM

## 2019-11-02 DIAGNOSIS — F419 Anxiety disorder, unspecified: Secondary | ICD-10-CM

## 2019-11-02 MED ORDER — LURASIDONE HCL 80 MG PO TABS: 80.0000 mg | ORAL_TABLET | Freq: Every day | ORAL | 2 refills | Status: DC

## 2019-11-02 MED ORDER — DIAZEPAM 2 MG PO TABS: ORAL_TABLET | ORAL | 1 refills | Status: DC

## 2019-11-02 MED ORDER — AMITRIPTYLINE HCL 150 MG PO TABS: 150.0000 mg | ORAL_TABLET | Freq: Every day | ORAL | 2 refills | Status: DC
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
## 2019-11-03 ENCOUNTER — Other Ambulatory Visit: Payer: Self-pay

## 2019-11-03 ENCOUNTER — Ambulatory Visit (INDEPENDENT_AMBULATORY_CARE_PROVIDER_SITE_OTHER): Payer: Medicare PPO | Admitting: Psychiatry

## 2019-11-03 ENCOUNTER — Encounter (HOSPITAL_COMMUNITY): Payer: Self-pay | Admitting: Psychiatry

## 2019-11-03 DIAGNOSIS — F319 Bipolar disorder, unspecified: Secondary | ICD-10-CM | POA: Diagnosis not present

## 2019-11-03 DIAGNOSIS — F419 Anxiety disorder, unspecified: Secondary | ICD-10-CM | POA: Diagnosis not present

## 2019-11-03 NOTE — Progress Notes (Signed)
Virtual Visit via Video Note  I connected with Tiffany Reese on 11/03/19 at  1:00 PM EST by a video enabled telemedicine application and verified that I am speaking with the correct person using two identifiers.  Location: Patient: Patient Home Provider: Home Office   I discussed the limitations of evaluation and management by telemedicine and the availability of in person appointments. The patient expressed understanding and agreed to proceed.  History of Present Illness: Bipolar 1 DO and PTSD   Treatment Plan Goals: Linzi wants to better manage her depression and anxiety symptoms by working through trauma history and developing healthy coping strategies.  Observations/Objective: Counselor met with Tiffany Reese for individual therapy via Webex. Counselor assessed MH symptoms and progress on treatment plan goals, with patient reporting increase in depression symptoms due to grief issues. Tiffany Reese presents with moderate depression and moderate anxiety. Tiffany Reese denied suicidal ideation or self-harm behaviors.   Tiffany Reese that her best friend had an accident and will be in rehab out of state for several months. Tiffany Reese reports having a grief response to this event, as this was her only friend she has seen in person weekly during the pandemic. Counselor assessed the impact of this even on her mental health, with Tiffany Reese noting that is has caused her to become more withdrawn, sad, lack of energy and motivation and more anxious. Counselor used MI interventions to explore internal motivations to continue working towards personal and therapy goals despite this loss, and to engage rational responses vs emotional responses. Tiffany Reese response was engaged, interactive and willing to implement alternative perspective/skills. Counselor assessed daily functioning and current trauma triggers, with Tiffany Reese noting that she is continuing to loose weight in a healthy manner, she is less active and productive, taking medications  as prescribed and has connected more with her family. Tiffany Reese noted more negative cognitions related to past traumas such as regret about financial decisions, lost relationships, her past addiction, and self-blame. Counselor Reese psychoeducation on the skill of self-forgiveness and discussed implementation of the practice. Tiffany Reese noted that she would journal or take note of areas she can implement forgiveness and report back at next session.   Assessment and Plan: Counselor will continue to meet with patient to address treatment plan goals. Patient will continue to follow recommendations of providers and implement skills learned in session.  Follow Up Instructions: Counselor will send information for next session via Webex.    The patient was advised to call back or seek an in-person evaluation if the symptoms worsen or if the condition fails to improve as anticipated.  I provided 55 minutes of non-face-to-face time during this encounter.   [name], LCSW

## 2019-11-12 ENCOUNTER — Other Ambulatory Visit: Payer: Self-pay | Admitting: Family Medicine

## 2019-11-15 ENCOUNTER — Other Ambulatory Visit: Payer: Self-pay | Admitting: Family Medicine

## 2019-11-17 ENCOUNTER — Ambulatory Visit (INDEPENDENT_AMBULATORY_CARE_PROVIDER_SITE_OTHER): Payer: Medicare PPO | Admitting: Psychiatry

## 2019-11-17 ENCOUNTER — Other Ambulatory Visit: Payer: Self-pay

## 2019-11-17 ENCOUNTER — Encounter (HOSPITAL_COMMUNITY): Payer: Self-pay | Admitting: Psychiatry

## 2019-11-17 DIAGNOSIS — F419 Anxiety disorder, unspecified: Secondary | ICD-10-CM

## 2019-11-17 DIAGNOSIS — F319 Bipolar disorder, unspecified: Secondary | ICD-10-CM
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
## 2019-11-21 ENCOUNTER — Encounter (HOSPITAL_COMMUNITY): Payer: Self-pay | Admitting: Psychiatry

## 2019-11-21 ENCOUNTER — Other Ambulatory Visit: Payer: Self-pay

## 2019-11-21 ENCOUNTER — Ambulatory Visit (INDEPENDENT_AMBULATORY_CARE_PROVIDER_SITE_OTHER): Payer: Medicare PPO | Admitting: Psychiatry

## 2019-11-21 DIAGNOSIS — F319 Bipolar disorder, unspecified: Secondary | ICD-10-CM | POA: Diagnosis not present

## 2019-11-21 DIAGNOSIS — F4310 Post-traumatic stress disorder, unspecified: Secondary | ICD-10-CM
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
## 2019-11-24 ENCOUNTER — Other Ambulatory Visit: Payer: Self-pay | Admitting: Family Medicine

## 2019-11-24 DIAGNOSIS — M797 Fibromyalgia: Secondary | ICD-10-CM

## 2019-12-05 ENCOUNTER — Other Ambulatory Visit: Payer: Self-pay | Admitting: Family Medicine

## 2019-12-20 ENCOUNTER — Other Ambulatory Visit: Payer: Self-pay | Admitting: Family Medicine

## 2019-12-20 DIAGNOSIS — M25572 Pain in left ankle and joints of left foot: Secondary | ICD-10-CM

## 2020-01-11 ENCOUNTER — Telehealth (INDEPENDENT_AMBULATORY_CARE_PROVIDER_SITE_OTHER): Payer: Medicare HMO | Admitting: Psychiatry

## 2020-01-11 ENCOUNTER — Other Ambulatory Visit: Payer: Self-pay

## 2020-01-11 DIAGNOSIS — F319 Bipolar disorder, unspecified: Secondary | ICD-10-CM | POA: Diagnosis not present

## 2020-01-11 DIAGNOSIS — F4310 Post-traumatic stress disorder, unspecified: Secondary | ICD-10-CM | POA: Diagnosis not present
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
## 2020-01-15 ENCOUNTER — Other Ambulatory Visit: Payer: Self-pay | Admitting: Family Medicine

## 2020-01-15 ENCOUNTER — Other Ambulatory Visit (HOSPITAL_COMMUNITY): Payer: Self-pay | Admitting: Psychiatry

## 2020-01-15 DIAGNOSIS — F4310 Post-traumatic stress disorder, unspecified: Secondary | ICD-10-CM

## 2020-01-15 DIAGNOSIS — F419 Anxiety disorder, unspecified: Secondary | ICD-10-CM

## 2020-01-16 ENCOUNTER — Other Ambulatory Visit: Payer: Self-pay | Admitting: Family Medicine

## 2020-01-16 DIAGNOSIS — M25572 Pain in left ankle and joints of left foot: Secondary | ICD-10-CM

## 2020-01-25 ENCOUNTER — Ambulatory Visit (HOSPITAL_COMMUNITY): Payer: Medicare HMO | Admitting: Psychiatry

## 2020-01-30 ENCOUNTER — Other Ambulatory Visit: Payer: Self-pay

## 2020-01-30 ENCOUNTER — Encounter (HOSPITAL_COMMUNITY): Payer: Self-pay | Admitting: Psychiatry

## 2020-01-30 ENCOUNTER — Telehealth (INDEPENDENT_AMBULATORY_CARE_PROVIDER_SITE_OTHER): Payer: Medicare HMO | Admitting: Psychiatry

## 2020-01-30 DIAGNOSIS — F319 Bipolar disorder, unspecified: Secondary | ICD-10-CM

## 2020-01-30 DIAGNOSIS — F419 Anxiety disorder, unspecified: Secondary | ICD-10-CM | POA: Diagnosis not present

## 2020-01-30 DIAGNOSIS — F4310 Post-traumatic stress disorder, unspecified: Secondary | ICD-10-CM

## 2020-01-30 MED ORDER — AMITRIPTYLINE HCL 150 MG PO TABS: 150.0000 mg | ORAL_TABLET | Freq: Every day | ORAL | 1 refills | Status: DC

## 2020-01-30 MED ORDER — DIAZEPAM 2 MG PO TABS: ORAL_TABLET | ORAL | 0 refills | Status: DC

## 2020-01-30 MED ORDER — LURASIDONE HCL 80 MG PO TABS: 80.0000 mg | ORAL_TABLET | Freq: Every day | ORAL | 1 refills | Status: DC
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
## 2020-02-07 ENCOUNTER — Telehealth: Payer: Self-pay | Admitting: Family Medicine

## 2020-02-07 NOTE — Chronic Care Management (AMB) (Signed)
°  Chronic Care Management   Note  02/07/2020 Name: Cariana Karge MRN: [id number] DOB: [birth year]  Horald Chestnut Sanson is a 59 y.o. year old female who is a primary care patient of Koberlein, Paris Lore, MD. I reached out to Sharen Counter by phone today in response to a referral sent by Ms. Horald Chestnut Westbrook's PCP, Wynn Banker, MD.   Ms. Sciarra was given information about Chronic Care Management services today including:  1. CCM service includes personalized support from designated clinical staff supervised by her physician, including individualized plan of care and coordination with other care providers 2. 24/7 contact phone numbers for assistance for urgent and routine care needs. 3. Service will only be billed when office clinical staff spend 20 minutes or more in a month to coordinate care. 4. Only one practitioner may furnish and bill the service in a calendar month. 5. The patient may stop CCM services at any time (effective at the end of the month) by phone call to the office staff.   Patient agreed to services and verbal consent obtained.   Follow up plan:   Prathima Ghanta [employer] Scheduler

## 2020-02-08 ENCOUNTER — Ambulatory Visit (INDEPENDENT_AMBULATORY_CARE_PROVIDER_SITE_OTHER): Payer: Medicare HMO | Admitting: Psychiatry

## 2020-02-08 ENCOUNTER — Other Ambulatory Visit: Payer: Self-pay

## 2020-02-08 DIAGNOSIS — F319 Bipolar disorder, unspecified: Secondary | ICD-10-CM
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
## 2020-02-09 ENCOUNTER — Other Ambulatory Visit: Payer: Self-pay | Admitting: Family Medicine

## 2020-02-15 ENCOUNTER — Other Ambulatory Visit: Payer: Self-pay | Admitting: Family Medicine

## 2020-02-15 DIAGNOSIS — M25572 Pain in left ankle and joints of left foot: Secondary | ICD-10-CM

## 2020-02-22 ENCOUNTER — Other Ambulatory Visit: Payer: Self-pay | Admitting: Family Medicine

## 2020-02-22 DIAGNOSIS — M797 Fibromyalgia: Secondary | ICD-10-CM

## 2020-02-23 ENCOUNTER — Other Ambulatory Visit: Payer: Self-pay

## 2020-02-23 ENCOUNTER — Ambulatory Visit (HOSPITAL_COMMUNITY): Payer: Medicare HMO | Admitting: Psychiatry

## 2020-02-23 NOTE — Telephone Encounter (Signed)
Pt is calling in stating that the pharmacy told her to contact her doctor to get the refill of pregabalin (LYRICA) 50 MG   Pharm: [facility]  [address].  Pt state that she will be out of it today and will be in to see Dr. Hassan Rowan on 02/27/2020

## 2020-02-24 ENCOUNTER — Other Ambulatory Visit: Payer: Self-pay

## 2020-02-27 ENCOUNTER — Other Ambulatory Visit: Payer: Self-pay

## 2020-02-27 ENCOUNTER — Ambulatory Visit (INDEPENDENT_AMBULATORY_CARE_PROVIDER_SITE_OTHER): Payer: Medicare HMO | Admitting: Family Medicine

## 2020-02-27 ENCOUNTER — Encounter: Payer: Self-pay | Admitting: Family Medicine

## 2020-02-27 VITALS — BP 120/80 | HR 90 | Temp 97.9°F | Ht 66.0 in | Wt 277.0 lb

## 2020-02-27 DIAGNOSIS — E538 Deficiency of other specified B group vitamins: Secondary | ICD-10-CM

## 2020-02-27 DIAGNOSIS — E118 Type 2 diabetes mellitus with unspecified complications: Secondary | ICD-10-CM

## 2020-02-27 DIAGNOSIS — R252 Cramp and spasm: Secondary | ICD-10-CM

## 2020-02-27 DIAGNOSIS — Z13220 Encounter for screening for lipoid disorders: Secondary | ICD-10-CM | POA: Diagnosis not present

## 2020-02-27 DIAGNOSIS — M2550 Pain in unspecified joint: Secondary | ICD-10-CM

## 2020-02-27 DIAGNOSIS — E039 Hypothyroidism, unspecified: Secondary | ICD-10-CM | POA: Diagnosis not present

## 2020-02-27 DIAGNOSIS — M5441 Lumbago with sciatica, right side: Secondary | ICD-10-CM | POA: Diagnosis not present

## 2020-02-27 DIAGNOSIS — F419 Anxiety disorder, unspecified: Secondary | ICD-10-CM | POA: Diagnosis not present

## 2020-02-27 DIAGNOSIS — E559 Vitamin D deficiency, unspecified: Secondary | ICD-10-CM | POA: Diagnosis not present

## 2020-02-27 DIAGNOSIS — F339 Major depressive disorder, recurrent, unspecified: Secondary | ICD-10-CM

## 2020-02-27 DIAGNOSIS — D509 Iron deficiency anemia, unspecified: Secondary | ICD-10-CM | POA: Diagnosis not present

## 2020-02-27 DIAGNOSIS — G8929 Other chronic pain: Secondary | ICD-10-CM

## 2020-02-27 DIAGNOSIS — I10 Essential (primary) hypertension: Secondary | ICD-10-CM

## 2020-02-27 DIAGNOSIS — Z6841 Body Mass Index (BMI) 40.0 and over, adult: Secondary | ICD-10-CM | POA: Diagnosis not present

## 2020-02-27 LAB — TSH: TSH: 1.89 u[IU]/mL (ref 0.35–4.50)

## 2020-02-27 LAB — COMPREHENSIVE METABOLIC PANEL
ALT: 85 U/L — ABNORMAL HIGH (ref 0–35)
AST: 50 U/L — ABNORMAL HIGH (ref 0–37)
Albumin: 4.6 g/dL (ref 3.5–5.2)
Alkaline Phosphatase: 140 U/L — ABNORMAL HIGH (ref 39–117)
BUN: 20 mg/dL (ref 6–23)
CO2: 26 mEq/L (ref 19–32)
Calcium: 9.6 mg/dL (ref 8.4–10.5)
Chloride: 102 mEq/L (ref 96–112)
Creatinine, Ser: 0.92 mg/dL (ref 0.40–1.20)
GFR: 62.99 mL/min (ref >60.00)
Glucose, Bld: 117 mg/dL — ABNORMAL HIGH (ref 70–99)
Potassium: 4.2 mEq/L (ref 3.5–5.1)
Sodium: 137 mEq/L (ref 135–145)
Total Bilirubin: 0.3 mg/dL (ref 0.2–1.2)
Total Protein: 7.2 g/dL (ref 6.0–8.3)

## 2020-02-27 LAB — LIPID PANEL
Cholesterol: 218 mg/dL — ABNORMAL HIGH (ref 0–200)
HDL: 58.7 mg/dL (ref >39.00)
LDL Cholesterol: 125 mg/dL — ABNORMAL HIGH (ref 0–99)
NonHDL: 159.36
Total CHOL/HDL Ratio: 4
Triglycerides: 174 mg/dL — ABNORMAL HIGH (ref 0.0–149.0)
VLDL: 34.8 mg/dL (ref 0.0–40.0)

## 2020-02-27 LAB — MICROALBUMIN / CREATININE URINE RATIO
Creatinine,U: 133.4 mg/dL
Microalb Creat Ratio: 0.8 mg/g (ref 0.0–30.0)
Microalb, Ur: 1.1 mg/dL (ref 0.0–1.9)

## 2020-02-27 LAB — CBC WITH DIFFERENTIAL/PLATELET
Basophils Absolute: 0.1 10*3/uL (ref 0.0–0.1)
Basophils Relative: 0.8 % (ref 0.0–3.0)
Eosinophils Absolute: 0.1 10*3/uL (ref 0.0–0.7)
Eosinophils Relative: 1.5 % (ref 0.0–5.0)
HCT: 39.7 % (ref 36.0–46.0)
Hemoglobin: 13.2 g/dL (ref 12.0–15.0)
Lymphocytes Relative: 27.4 % (ref 12.0–46.0)
Lymphs Abs: 2 10*3/uL (ref 0.7–4.0)
MCHC: 33.3 g/dL (ref 30.0–36.0)
MCV: 93.4 fl (ref 78.0–100.0)
Monocytes Absolute: 0.4 10*3/uL (ref 0.1–1.0)
Monocytes Relative: 5.9 % (ref 3.0–12.0)
Neutro Abs: 4.7 10*3/uL (ref 1.4–7.7)
Neutrophils Relative %: 64.4 % (ref 43.0–77.0)
Platelets: 210 10*3/uL (ref 150.0–400.0)
RBC: 4.24 Mil/uL (ref 3.87–5.11)
RDW: 14.3 % (ref 11.5–15.5)
WBC: 7.3 10*3/uL (ref 4.0–10.5)

## 2020-02-27 LAB — FOLATE: Folate: >24.8 ng/mL (ref >5.9)

## 2020-02-27 LAB — IBC + FERRITIN
Ferritin: 125.6 ng/mL (ref 10.0–291.0)
Iron: 67 ug/dL (ref 42–145)
Saturation Ratios: 22.3 % (ref 20.0–50.0)
Transferrin: 215 mg/dL (ref 212.0–360.0)

## 2020-02-27 LAB — VITAMIN D 25 HYDROXY (VIT D DEFICIENCY, FRACTURES): VITD: 49.43 ng/mL (ref 30.00–100.00)

## 2020-02-27 LAB — VITAMIN B12: Vitamin B-12: 1211 pg/mL — ABNORMAL HIGH (ref 211–911)

## 2020-02-27 LAB — SEDIMENTATION RATE: Sed Rate: 56 mm/hr — ABNORMAL HIGH (ref 0–30)

## 2020-02-27 LAB — HEMOGLOBIN A1C: Hgb A1c MFr Bld: 6.3 % (ref 4.6–6.5)

## 2020-02-27 MED ORDER — METHOCARBAMOL 750 MG PO TABS: 750.0000 mg | ORAL_TABLET | Freq: Three times a day (TID) | ORAL | 1 refills | Status: DC | PRN

## 2020-02-27 NOTE — Progress Notes (Signed)
Tiffany Reese DOB: 29-Sep-1960 Encounter date: 02/27/2020  This is a 59 y.o. female who presents with Chief Complaint  Patient presents with  . Follow-up    patient requests form to be completed to have her proper taxes decreased and renewal of handicapped placard  . Muscle Pain    xmonths  . Fatigue  . Labs Only    states her psychiatrist recommended she have labs and she requested these be done here    History of present illness: She is following with psychiatry and therapy on a regular basis.  Per psychiatry using Valium 2 mg up to 3 times daily for anxiety.  At last psychiatry visit, he encouraged her to follow-up with me for physical and blood work.  Has been having a lot of muscle cramps. Needs paperwork filled out.   Has been on disability (back dated) to 10/2014. Physical health and mental health.   Feels like fibromyalgia has been getting worse. Feels achy; just doesn't feel well. Tired all the time.   Completed [place] treatment for depression. Not sure if it helped her. Just doesn't feel like she can do anything. Just nothing to do, no connections. Just knows daughter, son in law. Daughter is working. She doesn't have any friends here.   She is awaiting oral surgery to get her dentures. She is awaiting coverage for dental insurance.   In normal day - gets up, takes care of dog. Sometimes gets on computer, watches tv. Sometimes still in "cocoon". She does meet her friend weekly to have lunch and to get groceries. This does help her with mood.   Has gained weight. Stopped following diet. Frustrated with this. Joints worse with weight gain.   Has had pain in cervical spine - knotting up and getting pain down right arm. Making muscles stiff. Lower back/lumbar disc issue as well. Following with Dr. Francee Piccolo. Has to use cart to get through store. Still tries to walk some. Pain gets so severe she starts to sweat. Usually at home she works until she can take a break and then feels  better and able to start working again. Has had lumbar injection with specialist. Has also had right shoulder injection. Hasn't seen them for neck. Didn't get much benefit with injection. Doesn't have followup. voltaren really does help her; she can really tell if she doesn't take this. Taking lyrica as well which helps. Also taking tylenol.     Allergies  Allergen Reactions  . Bupropion Other (See Comments)    Causes agitation  . Duloxetine Other (See Comments)    Causes involuntary movements  . Sulfa Antibiotics   . Metformin And Related     vomiting  . Paxil [Paroxetine Hcl]     Insomnia; manic  . Prozac [Fluoxetine Hcl]     Hyperactive, insomnia  . Trintellix [Vortioxetine]     Triggers fibromyalgia  . Zoloft [Sertraline Hcl]     Hyperactivity, insomnia   Current Meds  Medication Sig  . ACCU-CHEK AVIVA PLUS test strip USE AS DIRECTED UP TO 4 TIMES A DAY  . Accu-Chek Softclix Lancets lancets USE AS DIRECTED UP TO 4 TIMES A DAY  . amitriptyline (ELAVIL) 150 MG tablet Take 1 tablet (150 mg total) by mouth at bedtime.  . bismuth subsalicylate (PEPTO BISMOL) 262 MG/15ML suspension Take 30 mLs by mouth every 6 (six) hours as needed.  . blood glucose meter kit and supplies KIT Dispense based on patient and insurance preference. Use up to four times daily  as directed. (FOR ICD-9 250.00, 250.01).  . Blood Pressure Monitoring (BLOOD PRESSURE MONITOR/L CUFF) MISC 1 Device by Does not apply route daily.  . cetirizine (ZYRTEC) 10 MG tablet TAKE 1 TABLET BY MOUTH DAILY  . clotrimazole (LOTRIMIN) 1 % cream Apply 1 application topically 2 (two) times daily. Use on dry, clean skin. Treat for 14 days or 48 hours after rash clears.  . diazepam (VALIUM) 2 MG tablet Take two to three tab as needed for anxiety  . diclofenac (VOLTAREN) 75 MG EC tablet TAKE 1 TABLET (75 MG TOTAL) BY MOUTH 2 (TWO) TIMES DAILY AS NEEDED FOR MODERATE PAIN.  . furosemide (LASIX) 20 MG tablet TAKE 1 TABLET BY MOUTH EVERY  DAY  . levothyroxine (SYNTHROID) 50 MCG tablet TAKE 1 TABLET BY MOUTH EVERY DAY  . losartan (COZAAR) 50 MG tablet TAKE 1 TABLET BY MOUTH EVERY DAY  . lurasidone (LATUDA) 80 MG TABS tablet Take 1 tablet (80 mg total) by mouth daily with breakfast.  . Multiple Vitamins-Minerals (MULTIVITAMIN ADULT PO) Take 1 tablet by mouth daily.  Marland Kitchen omeprazole (PRILOSEC) 40 MG capsule TAKE 1 CAPSULE BY MOUTH EVERY DAY  . pregabalin (LYRICA) 50 MG capsule TAKE 1 CAPSULE BY MOUTH TWICE A DAY    Review of Systems  Constitutional: Positive for fatigue. Negative for chills and fever.  Respiratory: Negative for cough, chest tightness, shortness of breath and wheezing.   Cardiovascular: Negative for chest pain, palpitations and leg swelling.  Musculoskeletal: Positive for arthralgias, back pain, myalgias, neck pain and neck stiffness.  Psychiatric/Behavioral: Positive for decreased concentration. Negative for suicidal ideas. The patient is nervous/anxious.     Objective:  BP 120/80 (BP Location: Right Arm, Patient Position: Sitting, Cuff Size: Large)   Pulse 90   Temp 97.9 F (36.6 C) (Temporal)   Ht _0  (1.676 m)   Wt 277 lb (125.6 kg)   BMI 44.71 kg/m   Weight: 277 lb (125.6 kg)   BP Readings from Last 3 Encounters:  02/27/20 120/80  05/18/19 (!) 154/98  03/24/19 138/80   Wt Readings from Last 3 Encounters:  02/27/20 277 lb (125.6 kg)  01/30/20 265 lb (120.2 kg)  08/29/19 265 lb (120.2 kg)    Physical Exam Constitutional:      General: She is not in acute distress.    Appearance: She is well-developed.  Cardiovascular:     Rate and Rhythm: Normal rate and regular rhythm.     Heart sounds: Normal heart sounds. No murmur. No friction rub.  Pulmonary:     Effort: Pulmonary effort is normal. No respiratory distress.     Breath sounds: Normal breath sounds. No wheezing or rales.  Musculoskeletal:     Right lower leg: No edema.     Left lower leg: No edema.  Neurological:     Mental Status:  She is alert and oriented to person, place, and time.  Psychiatric:        Attention and Perception: Attention normal.        Mood and Affect: Mood is anxious and depressed. Affect is blunt.        Behavior: Behavior is slowed.        Cognition and Memory: Cognition normal.    Depression screen [facility] 2/9 02/27/2020 03/30/2019 03/24/2019 02/02/2019 11/01/2018  Decreased Interest _1 Down, Depressed, Hopeless _2 PHQ - 2 Score _3 Altered sleeping 1 0 3 2 1  Tired, decreased energy _0 Change in appetite 1 0 2 - 1  Feeling bad or failure about yourself  _1 Trouble concentrating 1 0 _2 Moving slowly or fidgety/restless 0 0 2 - 0  Suicidal thoughts 0 0 0 0 0  PHQ-9 Score _3 Difficult doing work/chores - - - - Extremely dIfficult    Assessment/Plan  1. Essential hypertension Blood pressures well controlled today.  Continue current medications. - CBC with Differential/Platelet; Future - Comprehensive metabolic panel; Future - Comprehensive metabolic panel - CBC with Differential/Platelet  2. Chronic bilateral low back pain with right-sided sciatica This is chronic for her.  She was doing better when she had Robaxin.  I refilled this today, but encouraged her to use sparingly as this can also create some fatigue.  She is following up with Ortho.  We discussed consideration for sports medicine management since she has a lot of musculoskeletal complaints and pain but would like to avoid surgery. - methocarbamol (ROBAXIN-750) 750 MG tablet; Take 1 tablet (750 mg total) by mouth every 8 (eight) hours as needed for muscle spasms.  Dispense: 270 tablet; Refill: 1  3. Hypothyroidism, unspecified type - TSH; Future - TSH  4. Iron deficiency anemia, unspecified iron deficiency anemia type - IBC + Ferritin; Future - Folate; Future - Folate - IBC + Ferritin  5. Anxiety She is following regularly with psychiatry and therapist.  She has been  stable on medications.  She wishes to have increased dose of Valium, but we discussed risk of this medication today and reasons to limit doses of benzodiazepines and controlled substances in general.  6. Depression, recurrent ([place]) She has been working very closely with psychiatry.  Her mood has improved, although she is still feeling challenged to accomplish tasks on a daily basis secondary to her mood.  We discussed making sure that all blood work looks good and that there is nothing else contributing to her fatigue.  7. Controlled type 2 diabetes mellitus with complication, without long-term current use of insulin ([place]) We need to recheck blood work to see how sugars have been controlled. - Hemoglobin A1c; Future - HM DIABETES FOOT EXAM; Future - Microalbumin / creatinine urine ratio; Future - Microalbumin / creatinine urine ratio - Hemoglobin A1c  8. Lipid screening - Lipid panel; Future - Lipid panel  9. B12 deficiency - Vitamin B12; Future - Vitamin B12  10. Vitamin D deficiency - VITAMIN D 25 Hydroxy (Vit-D Deficiency, Fractures); Future - VITAMIN D 25 Hydroxy (Vit-D Deficiency, Fractures)  11. Muscle cramps We will start with blood work and further results pending these results. - Magnesium, RBC; Future - Magnesium, RBC  12. Arthralgia, unspecified joint - Sedimentation rate; Future - Sedimentation rate  Return for pending bloodwork.      Micheline Rough, MD

## 2020-02-29 ENCOUNTER — Other Ambulatory Visit: Payer: Self-pay | Admitting: Family Medicine

## 2020-02-29 DIAGNOSIS — M2550 Pain in unspecified joint: Secondary | ICD-10-CM

## 2020-02-29 DIAGNOSIS — R748 Abnormal levels of other serum enzymes: Secondary | ICD-10-CM

## 2020-02-29 DIAGNOSIS — R700 Elevated erythrocyte sedimentation rate: Secondary | ICD-10-CM

## 2020-03-01 LAB — MAGNESIUM, RBC: Magnesium RBC: 6.4 mg/dL (ref 4.0–6.4)

## 2020-03-05 ENCOUNTER — Other Ambulatory Visit (INDEPENDENT_AMBULATORY_CARE_PROVIDER_SITE_OTHER): Payer: Medicare HMO

## 2020-03-05 DIAGNOSIS — R945 Abnormal results of liver function studies: Secondary | ICD-10-CM | POA: Diagnosis not present

## 2020-03-05 DIAGNOSIS — R700 Elevated erythrocyte sedimentation rate: Secondary | ICD-10-CM

## 2020-03-05 DIAGNOSIS — R748 Abnormal levels of other serum enzymes: Secondary | ICD-10-CM | POA: Diagnosis not present

## 2020-03-05 DIAGNOSIS — M2550 Pain in unspecified joint: Secondary | ICD-10-CM

## 2020-03-05 DIAGNOSIS — Z1159 Encounter for screening for other viral diseases: Secondary | ICD-10-CM | POA: Diagnosis not present

## 2020-03-05 LAB — URIC ACID: Uric Acid, Serum: 7.5 mg/dL — ABNORMAL HIGH (ref 2.4–7.0)

## 2020-03-05 LAB — C-REACTIVE PROTEIN: CRP: 2.3 mg/dL (ref 0.5–20.0)

## 2020-03-05 LAB — SEDIMENTATION RATE: Sed Rate: 64 mm/hr — ABNORMAL HIGH (ref 0–30)

## 2020-03-05 LAB — GAMMA GT: GGT: 81 U/L — ABNORMAL HIGH (ref 7–51)

## 2020-03-06 LAB — HEPATITIS PANEL, ACUTE
Hep A IgM: NONREACTIVE
Hep B C IgM: NONREACTIVE
Hepatitis B Surface Ag: NONREACTIVE
Hepatitis C Ab: NONREACTIVE
SIGNAL TO CUT-OFF: 0.03 (ref <1.00)

## 2020-03-06 LAB — RHEUMATOID FACTOR: Rheumatoid fact SerPl-aCnc: <14 IU/mL (ref <14)

## 2020-03-06 LAB — ANA: Anti Nuclear Antibody (ANA): NEGATIVE

## 2020-03-08 ENCOUNTER — Other Ambulatory Visit: Payer: Self-pay

## 2020-03-08 ENCOUNTER — Other Ambulatory Visit: Payer: Self-pay | Admitting: Family Medicine

## 2020-03-08 ENCOUNTER — Ambulatory Visit (INDEPENDENT_AMBULATORY_CARE_PROVIDER_SITE_OTHER): Payer: Medicare HMO | Admitting: Psychiatry

## 2020-03-08 DIAGNOSIS — F319 Bipolar disorder, unspecified: Secondary | ICD-10-CM | POA: Diagnosis not present

## 2020-03-08 DIAGNOSIS — R748 Abnormal levels of other serum enzymes: Secondary | ICD-10-CM
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
## 2020-03-09 ENCOUNTER — Other Ambulatory Visit: Payer: Self-pay | Admitting: Family Medicine

## 2020-03-09 DIAGNOSIS — M25572 Pain in left ankle and joints of left foot: Secondary | ICD-10-CM

## 2020-03-11 ENCOUNTER — Other Ambulatory Visit: Payer: Self-pay | Admitting: Family Medicine

## 2020-03-13 ENCOUNTER — Encounter (HOSPITAL_COMMUNITY): Payer: Self-pay | Admitting: Psychiatry

## 2020-03-13 ENCOUNTER — Telehealth (INDEPENDENT_AMBULATORY_CARE_PROVIDER_SITE_OTHER): Payer: Medicare HMO | Admitting: Psychiatry

## 2020-03-13 ENCOUNTER — Ambulatory Visit: Payer: Medicare HMO

## 2020-03-13 ENCOUNTER — Other Ambulatory Visit: Payer: Self-pay

## 2020-03-13 DIAGNOSIS — F319 Bipolar disorder, unspecified: Secondary | ICD-10-CM | POA: Diagnosis not present

## 2020-03-13 DIAGNOSIS — E039 Hypothyroidism, unspecified: Secondary | ICD-10-CM

## 2020-03-13 DIAGNOSIS — K219 Gastro-esophageal reflux disease without esophagitis: Secondary | ICD-10-CM

## 2020-03-13 DIAGNOSIS — E118 Type 2 diabetes mellitus with unspecified complications: Secondary | ICD-10-CM

## 2020-03-13 DIAGNOSIS — F4310 Post-traumatic stress disorder, unspecified: Secondary | ICD-10-CM | POA: Diagnosis not present

## 2020-03-13 DIAGNOSIS — F419 Anxiety disorder, unspecified: Secondary | ICD-10-CM | POA: Diagnosis not present

## 2020-03-13 DIAGNOSIS — F329 Major depressive disorder, single episode, unspecified: Secondary | ICD-10-CM

## 2020-03-13 DIAGNOSIS — I10 Essential (primary) hypertension: Secondary | ICD-10-CM

## 2020-03-13 MED ORDER — DIAZEPAM 2 MG PO TABS: ORAL_TABLET | ORAL | 1 refills | Status: DC

## 2020-03-13 MED ORDER — LURASIDONE HCL 80 MG PO TABS: 80.0000 mg | ORAL_TABLET | Freq: Every day | ORAL | 1 refills | Status: DC

## 2020-03-13 MED ORDER — AMITRIPTYLINE HCL 150 MG PO TABS: 150.0000 mg | ORAL_TABLET | Freq: Every day | ORAL | 1 refills | Status: DC
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
## 2020-03-13 NOTE — Chronic Care Management (AMB) (Signed)
Chronic Care Management Pharmacy  Name: Tiffany Reese  MRN: [id number] DOB: April 06, 1961  Initial Questions: 1. Have you seen any other providers since your last visit? NA 2. Any changes in your medicines or health? No   Chief Complaint/ HPI  Tiffany Reese,  59 y.o. , female presents for their Initial CCM visit with the clinical pharmacist via telephone due to COVID-19 Pandemic.  PCP : Caren Macadam, MD  Their chronic conditions include: Diabetes, HTN, Hypothyroidism, DDD, fibromyalgia, chronic bilateral low back pain, GERD, edema, depression, allergic rhinitis   Office Visits: 02/27/2020- Micheline Rough, MD- Patient presented for office visit for follow up. Patient to obtain blood work: CBC, CMP, TSH, IBC, folate, A1c, microalbumin, lipid panel, vitamin B12, vitamin D . Methocarbamol refilled, and discussed sports medicine management. Patient to return pending blood work.   Consult Visit: Sees routinely. Unable to access notes.  Maurice Small, [place], MD- Behaviorial health  Medications: Outpatient Encounter Medications as of 03/13/2020  Medication Sig  . ACCU-CHEK AVIVA PLUS test strip USE AS DIRECTED UP TO 4 TIMES A DAY  . Accu-Chek Softclix Lancets lancets USE AS DIRECTED UP TO 4 TIMES A DAY  . blood glucose meter kit and supplies KIT Dispense based on patient and insurance preference. Use up to four times daily as directed. (FOR ICD-9 250.00, 250.01).  . Blood Pressure Monitoring (BLOOD PRESSURE MONITOR/L CUFF) MISC 1 Device by Does not apply route daily.  . cetirizine (ZYRTEC) 10 MG tablet TAKE 1 TABLET BY MOUTH EVERY DAY  . Cholecalciferol (VITAMIN D-3) 125 MCG (5000 UT) TABS Take 1 tablet by mouth daily.  . diclofenac (VOLTAREN) 75 MG EC tablet TAKE 1 TABLET (75 MG TOTAL) BY MOUTH 2 (TWO) TIMES DAILY AS NEEDED FOR MODERATE PAIN.  Marland Kitchen diclofenac Sodium (VOLTAREN) 1 % GEL Apply topically as needed.  . Ferrous Sulfate (IRON) 325 (65  Fe) MG TABS Take 1 tablet by mouth daily.  . furosemide (LASIX) 20 MG tablet TAKE 1 TABLET BY MOUTH EVERY DAY  . levothyroxine (SYNTHROID) 50 MCG tablet TAKE 1 TABLET BY MOUTH EVERY DAY  . losartan (COZAAR) 50 MG tablet TAKE 1 TABLET BY MOUTH EVERY DAY  . methocarbamol (ROBAXIN-750) 750 MG tablet Take 1 tablet (750 mg total) by mouth every 8 (eight) hours as needed for muscle spasms.  . Multiple Vitamins-Minerals (MULTIVITAMIN ADULT PO) Take 1 tablet by mouth daily.  Marland Kitchen omeprazole (PRILOSEC) 40 MG capsule TAKE 1 CAPSULE BY MOUTH EVERY DAY  . pregabalin (LYRICA) 50 MG capsule TAKE 1 CAPSULE BY MOUTH TWICE A DAY  . [DISCONTINUED] amitriptyline (ELAVIL) 150 MG tablet Take 1 tablet (150 mg total) by mouth at bedtime.  . [DISCONTINUED] diazepam (VALIUM) 2 MG tablet Take two to three tab as needed for anxiety  . [DISCONTINUED] lurasidone (LATUDA) 80 MG TABS tablet Take 1 tablet (80 mg total) by mouth daily with breakfast.  . bismuth subsalicylate (PEPTO BISMOL) 262 MG/15ML suspension Take 30 mLs by mouth every 6 (six) hours as needed. (Patient not taking: Reported on 03/13/2020)  . clotrimazole (LOTRIMIN) 1 % cream Apply 1 application topically 2 (two) times daily. Use on dry, clean skin. Treat for 14 days or 48 hours after rash clears. (Patient not taking: Reported on 03/13/2020)   No facility-administered encounter medications on file as of 03/13/2020.     Current Diagnosis/Assessment:  Goals Addressed            This Visit's Progress   . Pharmacy  Care Plan       CARE PLAN ENTRY (see longitudinal plan of care for additional care plan information)  Current Barriers:  . Chronic Disease Management support, education, and care coordination needs related to Hypertension, Diabetes, GERD, Hypothyroidism, and Depression   Hypertension BP Readings from Last 3 Encounters:  02/27/20 120/80  05/18/19 (!) 154/98  03/24/19 138/80   . Pharmacist Clinical Goal(s): o Over the next 14 days, patient  will work with PharmD and providers to maintain BP goal <130/80 . Current regimen:  o Losartan 14m, 1 tablet once daily  . Interventions: o Recommended monitoring at home for 2 weeks.  . Patient self care activities - Over the next 14 days, patient will: o Check BP daily, document, and provide at future appointments o Ensure daily salt intake < 2300 mg/day  Diabetes Lab Results  Component Value Date/Time   HGBA1C 6.3 02/27/2020 12:54 PM   HGBA1C 6.4 02/07/2019 01:59 PM   . Pharmacist Clinical Goal(s): o Over the next 90 days, patient will work with PharmD and providers to maintain A1c goal <7% . Current regimen:  o No medications.  . Patient self care activities - Over the next 90 days, patient will: o Continue lifestyle modifications.   Hypothyroidism . Pharmacist Clinical Goal(s) o Over the next 90 days, patient will work with PharmD and providers to maintain TSH: 0.35 - 4.5 uIU/ mL . Current regimen:  o Levothyroxine 530m, 1 tablet once daily  . Interventions: o We discussed:  administration of levothyroxine ( at least 30 minutes before first meal)  . Patient self care activities - Over the next 90 days, patient will: o Continue current medications as instructed.   GERD . Pharmacist Clinical Goal(s) o Over the next 90 days, patient will work with PharmD and providers to minimize reflux symptoms.  . Current regimen:  . Omeprazole 4039m1 capsule once daily . Interventions: . Discussed non-pharmacological interventions for acid reflux. Take measures to prevent acid reflux, such as avoiding spicy foods, avoiding caffeine, avoid laying down a few hours after eating, and raising the head of the bed. . Patient self care activities o Patient will continue current medications.   Depression  . Pharmacist Clinical Goal(s) o Over the next 14 days, patient will work with PharmD and providers to Improve mood/ depression symptoms. . Current regimen:  . Amitriptyline 150m66m  tablet at bedtime  . Diazepam (Valium) 2mg,33mto 3 tablets as needed for anxiety . lurasidone (Latuda) 80mg,54mablet once daily with breakfast . Interventions: o Recommend discussing with Dr. Afeen Justine Null lurasidone (LatudAnette Guarnerieating 1t least 350 calories for better absorption.  Also discussed taking within 30 minutes of dinner (since that is her biggest meal) and ask Dr. ArfeenAdele Schilderurther guidance.  . Patient self care activities o Patient will discuss above with upcoming visit with Dr. ArfeenAdele Schilderdication management . Pharmacist Clinical Goal(s): o Over the next 90 days, patient will work with PharmD and providers to maintain optimal medication adherence . Current [facility] . Interventions o Comprehensive medication review performed. o Continue current medication management strategy . Patient self care activities - Over the next 90 days, patient will: o Take medications as prescribed o Report any questions or concerns to PharmD and/or provider(s)  Initial goal documentation       SDOH Interventions     Most Recent Value  SDOH Interventions  Transportation Interventions Intervention Not Indicated      Diabetes  Recent Relevant Labs: Lab Results  Component Value Date/Time   HGBA1C 6.3 02/27/2020 12:54 PM   HGBA1C 6.4 02/07/2019 01:59 PM   MICROALBUR 1.1 02/27/2020 12:54 PM    Patient has failed these meds in past: none  Patient is currently controlled on the following medications:   No medications   Last diabetic Eye exam:  Lab Results  Component Value Date/Time   HMDIABEYEEXA No Retinopathy 07/18/2019 12:00 AM    Last diabetic Foot exam: No results found for: HMDIABFOOTEX   Plan Continue current medications   Hypertension  Notes some dizziness/ lightheadedness. Checks BP at home. Does not remember SBP; states a bit high. DBP: 90s  Denies chest pain. Endorses some Headaches.   BP today is:  <140/90  Office blood pressures are  BP Readings from  Last 3 Encounters:  02/27/20 120/80  05/18/19 (!) 154/98  03/24/19 138/80   Patient has failed these meds in the past: none   Patient checks BP at home infrequently  Patient home BP readings are ranging: does not remember   Patient is controlled on (based on OV readings):   Losartan 24m, 1 tablet once daily   We discussed monitoring at home.   Plan Patient instucted to check BP at home. Will call patient in 2 weeks to reassess.  Continue current medications    Hypothyroidism   Lab Results  Component Value Date/Time   TSH 1.89 02/27/2020 12:54 PM   TSH 3.23 11/01/2018 11:12 AM   Patient is currently controlled on the following medications:  . Levothyroxine 519m, 1 tablet once daily   We discussed:  administration of levothyroxine ( at least 30 minutes before first meal)   Plan Continue current medications  DDD   Kidney Function Lab Results  Component Value Date/Time   CREATININE 0.92 02/27/2020 12:54 PM   CREATININE 0.98 02/07/2019 01:59 PM   GFR 62.99 02/27/2020 12:54 PM   GFRNONAA >60 11/05/2018 05:11 PM   GFRAA >60 11/05/2018 05:11 PM   K 4.2 02/27/2020 12:54 PM   K 4.1 02/07/2019 01:59 PM   Patient has failed these meds in past: oxycodone, APAP   Patient is currently controlled on the following medications:   Diclofenac 7522m1 tablet twice daily as needed for moderate pain   Plan Continue current medications  Chronic bilateral low back pain     Patient is currently controlled on the following medications:  . MMarland Kitchenthocarbamol 750m26m tablet every eight hours as needed for muscle spasms   We discussed:   - drowsiness with concomitant meds   Plan Continue current medications  Fibromyalgia    Patient reports fibromyalgia is acting up again (states it is when she feels she is having the flu).    Patient is currently uncontrolled on the following medications:  . pregabalin 50mg39mcapsule twice daily   Plan Recommend dose increase to  pregabalin 75mg,84mapsule twice daily.   Edema  Denies worsening of swelling. Patient states she has more leg swelling issues with left ankle due to hx of ankle broken   Patient is currently controlled on the following medications:  . Furosemide 20mg, 28mblet once daily (AM)   Plan Continue current medications  Depression  Patient reported being told to take with breakfast and cannot eat much for breakfast or she will feel nauseated.  Patient is currently managed on the following medications:  . Amitriptyline 150mg, 151mlet at bedtime  . Diazepam (Valium) 2mg, 2 t38m tablets as needed  for anxiety (patient reports not taking daily) . lurasidone (Latuda) 29m, 1 tablet once daily with breakfast (reports takes in the morning with a Slimfast)   We discussed:  Eating at least 350 calories for better absorption and if okay, by provider she is okay to continue as is if unable to eat at least 350 calories. Also discussed taking within 30 minutes of dinner (since that is her biggest meal).   Plan Managed by behavioral health (Dr. AAdele Schilder. Patient to discuss administration time with specialist. She reports having a visit scheduled for later today (6/22)   Continue current medications  GERD  Reports still having sx due to overeating.  Denies triggers.  Patient is currently controlled on the following medications:  . Omeprazole 468m 1 capsule once daily  Plan Continue current medications  Allergic rhinitis    Patient is currently controlled on the following medications:  . Cetirizine 106m1 tablet once daily   Plan Continue current medications  OTC/ supplements   Patient is currently on the following medications:  . MMarland Kitchenltivitamin, 1 tablet once daily   Plan Continue current medications  Medication Management  Patient organizes medications: patient reports having own strategy. She has medications on table and supplements in a bag. Since it is routine for her, she remembers  to take them as instructed. She has tried and cannot do daily/ weekly pill box [facility]  Adherence: no gaps in refill history (from medication dispense history from 09/23/19 to 03/16/2020)   Follow up Follow up visit with PharmD in 2 weeks     AnnAnson CroftsharmD Clinical Pharmacist [facility] [phone]

## 2020-03-14 ENCOUNTER — Other Ambulatory Visit: Payer: Self-pay | Admitting: Family Medicine

## 2020-03-14 ENCOUNTER — Ambulatory Visit
Admission: RE | Admit: 2020-03-14 | Discharge: 2020-03-14 | Disposition: A | Discharge status: Home / Self Care | Payer: Medicare HMO | Source: Ambulatory Visit | Attending: Family Medicine | Admitting: Family Medicine

## 2020-03-14 DIAGNOSIS — E118 Type 2 diabetes mellitus with unspecified complications: Secondary | ICD-10-CM

## 2020-03-14 DIAGNOSIS — K760 Fatty (change of) liver, not elsewhere classified: Secondary | ICD-10-CM | POA: Diagnosis not present

## 2020-03-14 DIAGNOSIS — R748 Abnormal levels of other serum enzymes: Secondary | ICD-10-CM

## 2020-03-14 DIAGNOSIS — F339 Major depressive disorder, recurrent, unspecified: Secondary | ICD-10-CM

## 2020-03-14 DIAGNOSIS — I10 Essential (primary) hypertension: Secondary | ICD-10-CM

## 2020-03-16 ENCOUNTER — Other Ambulatory Visit: Payer: Self-pay | Admitting: *Deleted

## 2020-03-16 DIAGNOSIS — Z1211 Encounter for screening for malignant neoplasm of colon: Secondary | ICD-10-CM

## 2020-03-16 DIAGNOSIS — R748 Abnormal levels of other serum enzymes: Secondary | ICD-10-CM

## 2020-03-21 NOTE — Progress Notes (Signed)
This encounter was created in error - please disregard.

## 2020-03-21 NOTE — Patient Instructions (Addendum)
Visit Information  Goals Addressed            This Visit's Progress   . Pharmacy Care Plan       CARE PLAN ENTRY (see longitudinal plan of care for additional care plan information)  Current Barriers:  . Chronic Disease Management support, education, and care coordination needs related to Hypertension, Diabetes, GERD, Hypothyroidism, and Depression   Hypertension BP Readings from Last 3 Encounters:  02/27/20 120/80  05/18/19 (!) 154/98  03/24/19 138/80   . Pharmacist Clinical Goal(s): o Over the next 14 days, patient will work with PharmD and providers to maintain BP goal <130/80 . Current regimen:  o Losartan 50mg , 1 tablet once daily  . Interventions: o Recommended monitoring at home for 2 weeks.  . Patient self care activities - Over the next 14 days, patient will: o Check BP daily, document, and provide at future appointments o Ensure daily salt intake < 2300 mg/day  Diabetes Lab Results  Component Value Date/Time   HGBA1C 6.3 02/27/2020 12:54 PM   HGBA1C 6.4 02/07/2019 01:59 PM   . Pharmacist Clinical Goal(s): o Over the next 90 days, patient will work with PharmD and providers to maintain A1c goal <7% . Current regimen:  o No medications.  . Patient self care activities - Over the next 90 days, patient will: o Continue lifestyle modifications.   Hypothyroidism . Pharmacist Clinical Goal(s) o Over the next 90 days, patient will work with PharmD and providers to maintain TSH: 0.35 - 4.5 uIU/ mL . Current regimen:  o Levothyroxine 02/09/2019, 1 tablet once daily  . Interventions: o We discussed:  administration of levothyroxine ( at least 30 minutes before first meal)  . Patient self care activities - Over the next 90 days, patient will: o Continue current medications as instructed.   GERD . Pharmacist Clinical Goal(s) o Over the next 90 days, patient will work with PharmD and providers to minimize reflux symptoms.  . Current regimen:  . Omeprazole 40mg , 1  capsule once daily . Interventions: . Discussed non-pharmacological interventions for acid reflux. Take measures to prevent acid reflux, such as avoiding spicy foods, avoiding caffeine, avoid laying down a few hours after eating, and raising the head of the bed. . Patient self care activities o Patient will continue current medications.   Depression  . Pharmacist Clinical Goal(s) o Over the next 14 days, patient will work with PharmD and providers to Improve mood/ depression symptoms. . Current regimen:  . Amitriptyline 150mg , 1 tablet at bedtime  . Diazepam (Valium) 2mg , 2 to 3 tablets as needed for anxiety . lurasidone (Latuda) 80mg , 1 tablet once daily with breakfast . Interventions: o Recommend discussing with Dr. about lurasidone ) and eating 1t least 350 calories for better absorption.  Also discussed taking within 30 minutes of dinner (since that is her biggest meal) and ask Dr. for further guidance.  . Patient self care activities o Patient will discuss above with upcoming visit with Dr. .   Medication management . Pharmacist Clinical Goal(s): o Over the next 90 days, patient will work with PharmD and providers to maintain optimal medication adherence . [facility] . Interventions o Comprehensive medication review performed. o Continue current medication management strategy . Patient self care activities - Over the next 90 days, patient will: o Take medications as prescribed o Report any questions or concerns to PharmD and/or provider(s)  Initial goal documentation        Tiffany Reese was  given information about Chronic Care Management services today including:  1. CCM service includes personalized support from designated clinical staff supervised by her physician, including individualized plan of care and coordination with other care providers 2. 24/7 contact phone numbers for assistance for urgent and routine care needs. 3. Standard  insurance, coinsurance, copays and deductibles apply for chronic care management only during months in which we provide at least 20 minutes of these services. Most insurances cover these services at 100%, however patients may be responsible for any copay, coinsurance and/or deductible if applicable. This service may help you avoid the need for more expensive face-to-face services. 4. Only one practitioner may furnish and bill the service in a calendar month. 5. The patient may stop CCM services at any time (effective at the end of the month) by phone call to the office staff.  Patient agreed to services and verbal consent obtained.   The patient verbalized understanding of instructions provided today and agreed to receive a mailed copy of patient instruction and/or educational materials.  Telephone follow up appointment with pharmacy team member scheduled for: 03/29/2020.   Laural Benes, PharmD Clinical Pharmacist [facility] [place] [phone]     Managing Your Hypertension Hypertension is commonly called high blood pressure. This is when the force of your blood pressing against the walls of your arteries is too strong. Arteries are blood vessels that carry blood from your heart throughout your body. Hypertension forces the heart to work harder to pump blood, and may cause the arteries to become narrow or stiff. Having untreated or uncontrolled hypertension can cause heart attack, stroke, kidney disease, and other problems. What are blood pressure readings? A blood pressure reading consists of a higher number over a lower number. Ideally, your blood pressure should be below 120/80. The first ("top") number is called the systolic pressure. It is a measure of the pressure in your arteries as your heart beats. The second ("bottom") number is called the diastolic pressure. It is a measure of the pressure in your arteries as the heart relaxes. What does my blood pressure  reading mean? Blood pressure is classified into four stages. Based on your blood pressure reading, your health care provider may use the following stages to determine what type of treatment you need, if any. Systolic pressure and diastolic pressure are measured in a unit called mm Hg. Normal  Systolic pressure: below 120.  Diastolic pressure: below 80. Elevated  Systolic pressure: 120-129.  Diastolic pressure: below 80. Hypertension stage 1  Systolic pressure: 130-139.  Diastolic pressure: 80-89. Hypertension stage 2  Systolic pressure: 140 or above.  Diastolic pressure: 90 or above. What health risks are associated with hypertension? Managing your hypertension is an important responsibility. Uncontrolled hypertension can lead to:  A heart attack.  A stroke.  A weakened blood vessel (aneurysm).  Heart failure.  Kidney damage.  Eye damage.  Metabolic syndrome.  Memory and concentration problems. What changes can I make to manage my hypertension? Hypertension can be managed by making lifestyle changes and possibly by taking medicines. Your health care provider will help you make a plan to bring your blood pressure within a normal range. Eating and drinking   Eat a diet that is high in fiber and potassium, and low in salt (sodium), added sugar, and fat. An example eating plan is called the DASH (Dietary Approaches to Stop Hypertension) diet. To eat this way: ? Eat plenty of fresh fruits and vegetables. Try to fill  half of your plate at each meal with fruits and vegetables. ? Eat whole grains, such as whole wheat pasta, brown rice, or whole grain bread. Fill about one quarter of your plate with whole grains. ? Eat low-fat diary products. ? Avoid fatty cuts of meat, processed or cured meats, and poultry with skin. Fill about one quarter of your plate with lean proteins such as fish, chicken without skin, beans, eggs, and tofu. ? Avoid premade and processed foods. These  tend to be higher in sodium, added sugar, and fat.  Reduce your daily sodium intake. Most people with hypertension should eat less than 1,500 mg of sodium a day.  Limit alcohol intake to no more than 1 drink a day for nonpregnant women and 2 drinks a day for men. One drink equals 12 oz of beer, 5 oz of wine, or 1 oz of hard liquor. Lifestyle  Work with your health care provider to maintain a healthy body weight, or to lose weight. Ask what an ideal weight is for you.  Get at least 30 minutes of exercise that causes your heart to beat faster (aerobic exercise) most days of the week. Activities may include walking, swimming, or biking.  Include exercise to strengthen your muscles (resistance exercise), such as weight lifting, as part of your weekly exercise routine. Try to do these types of exercises for 30 minutes at least 3 days a week.  Do not use any products that contain nicotine or tobacco, such as cigarettes and e-cigarettes. If you need help quitting, ask your health care provider.  Control any long-term (chronic) conditions you have, such as high cholesterol or diabetes. Monitoring  Monitor your blood pressure at home as told by your health care provider. Your personal target blood pressure may vary depending on your medical conditions, your age, and other factors.  Have your blood pressure checked regularly, as often as told by your health care provider. Working with your health care provider  Review all the medicines you take with your health care provider because there may be side effects or interactions.  Talk with your health care provider about your diet, exercise habits, and other lifestyle factors that may be contributing to hypertension.  Visit your health care provider regularly. Your health care provider can help you create and adjust your plan for managing hypertension. Will I need medicine to control my blood pressure? Your health care provider may prescribe medicine  if lifestyle changes are not enough to get your blood pressure under control, and if:  Your systolic blood pressure is 130 or higher.  Your diastolic blood pressure is 80 or higher. Take medicines only as told by your health care provider. Follow the directions carefully. Blood pressure medicines must be taken as prescribed. The medicine does not work as well when you skip doses. Skipping doses also puts you at risk for problems. Contact a health care provider if:  You think you are having a reaction to medicines you have taken.  You have repeated (recurrent) headaches.  You feel dizzy.  You have swelling in your ankles.  You have trouble with your vision. Get help right away if:  You develop a severe headache or confusion.  You have unusual weakness or numbness, or you feel faint.  You have severe pain in your chest or abdomen.  You vomit repeatedly.  You have trouble breathing. Summary  Hypertension is when the force of blood pumping through your arteries is too strong. If this condition is not  controlled, it may put you at risk for serious complications.  Your personal target blood pressure may vary depending on your medical conditions, your age, and other factors. For most people, a normal blood pressure is less than 120/80.  Hypertension is managed by lifestyle changes, medicines, or both. Lifestyle changes include weight loss, eating a healthy, low-sodium diet, exercising more, and limiting alcohol. This information is not intended to replace advice given to you by your health care provider. Make sure you discuss any questions you have with your health care provider. Document Revised: 12/31/2018 Document Reviewed: 08/06/2016 Elsevier Patient Education  2020 [employer]

## 2020-03-22 ENCOUNTER — Ambulatory Visit (HOSPITAL_COMMUNITY): Payer: Medicare HMO | Admitting: Psychiatry

## 2020-03-29 ENCOUNTER — Telehealth: Payer: Medicare HMO

## 2020-03-29 ENCOUNTER — Telehealth: Payer: Self-pay

## 2020-03-29 NOTE — Telephone Encounter (Signed)
°  Chronic Care Management   Outreach Note  03/29/2020 Name: Tiffany Reese MRN: [id number] DOB: 06-14-1961  Referred by: Wynn Banker, MD Reason for referral : Chronic Care Management   An unsuccessful telephone outreach was attempted today. The patient was referred to the pharmacist for assistance with care management and care coordination.   Follow Up Plan:  Left message for patient to return call for CCM follow up.    Laural Benes, PharmD Clinical Pharmacist [facility] [place] [phone]

## 2020-04-05 ENCOUNTER — Ambulatory Visit (INDEPENDENT_AMBULATORY_CARE_PROVIDER_SITE_OTHER): Payer: Medicare HMO | Admitting: Psychiatry

## 2020-04-05 ENCOUNTER — Other Ambulatory Visit: Payer: Self-pay

## 2020-04-05 DIAGNOSIS — F319 Bipolar disorder, unspecified: Secondary | ICD-10-CM
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
## 2020-04-09 ENCOUNTER — Telehealth (HOSPITAL_COMMUNITY): Payer: Self-pay | Admitting: *Deleted

## 2020-04-09 NOTE — Telephone Encounter (Signed)
Patient wants provider to please call her. Per pt she needs to go back to [place] due to her being back from her maternity. Per pt she thought about it over the weekend and she wants to go back. [phone].

## 2020-04-10 DIAGNOSIS — M25572 Pain in left ankle and joints of left foot: Secondary | ICD-10-CM | POA: Diagnosis not present

## 2020-04-15 ENCOUNTER — Other Ambulatory Visit: Payer: Self-pay | Admitting: Family Medicine

## 2020-04-18 ENCOUNTER — Other Ambulatory Visit: Payer: Self-pay | Admitting: Family Medicine

## 2020-04-19 ENCOUNTER — Other Ambulatory Visit: Payer: Self-pay

## 2020-04-19 ENCOUNTER — Ambulatory Visit (HOSPITAL_COMMUNITY): Payer: Medicare HMO | Admitting: Psychiatry

## 2020-04-28 ENCOUNTER — Other Ambulatory Visit: Payer: Self-pay | Admitting: Family Medicine

## 2020-04-28 DIAGNOSIS — M25572 Pain in left ankle and joints of left foot: Secondary | ICD-10-CM

## 2020-04-30 DIAGNOSIS — M25572 Pain in left ankle and joints of left foot: Secondary | ICD-10-CM | POA: Diagnosis not present

## 2020-05-01 ENCOUNTER — Other Ambulatory Visit: Payer: Self-pay | Admitting: Family Medicine

## 2020-05-03 ENCOUNTER — Ambulatory Visit (HOSPITAL_COMMUNITY): Payer: Medicare HMO | Admitting: Psychiatry

## 2020-05-07 ENCOUNTER — Other Ambulatory Visit: Payer: Self-pay | Admitting: Family Medicine

## 2020-05-07 ENCOUNTER — Other Ambulatory Visit: Payer: Self-pay

## 2020-05-07 ENCOUNTER — Encounter (HOSPITAL_COMMUNITY): Payer: Self-pay | Admitting: Psychiatry

## 2020-05-07 ENCOUNTER — Telehealth (INDEPENDENT_AMBULATORY_CARE_PROVIDER_SITE_OTHER): Payer: Medicare HMO | Admitting: Psychiatry

## 2020-05-07 DIAGNOSIS — F419 Anxiety disorder, unspecified: Secondary | ICD-10-CM | POA: Diagnosis not present

## 2020-05-07 DIAGNOSIS — F4310 Post-traumatic stress disorder, unspecified: Secondary | ICD-10-CM

## 2020-05-07 DIAGNOSIS — F319 Bipolar disorder, unspecified: Secondary | ICD-10-CM

## 2020-05-07 MED ORDER — DIAZEPAM 2 MG PO TABS: ORAL_TABLET | ORAL | 1 refills | Status: DC

## 2020-05-07 MED ORDER — AMITRIPTYLINE HCL 150 MG PO TABS: 150.0000 mg | ORAL_TABLET | Freq: Every day | ORAL | 1 refills | Status: DC

## 2020-05-07 MED ORDER — LURASIDONE HCL 80 MG PO TABS: 80.0000 mg | ORAL_TABLET | Freq: Every day | ORAL | 1 refills | Status: DC
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
## 2020-05-11 ENCOUNTER — Telehealth: Payer: Self-pay | Admitting: Family Medicine

## 2020-05-11 NOTE — Telephone Encounter (Signed)
Pt is saying that Dr. Hassan Rowan is not in the [place] [facility] program and they will not cover her visits. She does not want to lose her as a provider and doesn't know what to do.   She said that she gave them her MPI # and is not in the program and wont be covered  Pt is asking for someone to call her back  Please advise

## 2020-05-15 ENCOUNTER — Other Ambulatory Visit: Payer: Self-pay | Admitting: Family Medicine

## 2020-05-15 DIAGNOSIS — M797 Fibromyalgia: Secondary | ICD-10-CM

## 2020-05-17 ENCOUNTER — Other Ambulatory Visit: Payer: Self-pay

## 2020-05-17 ENCOUNTER — Ambulatory Visit (HOSPITAL_COMMUNITY): Payer: Medicare HMO | Admitting: Psychiatry

## 2020-05-29 ENCOUNTER — Ambulatory Visit (INDEPENDENT_AMBULATORY_CARE_PROVIDER_SITE_OTHER): Payer: Medicare HMO | Admitting: Psychiatry

## 2020-05-29 ENCOUNTER — Other Ambulatory Visit: Payer: Self-pay

## 2020-05-29 DIAGNOSIS — F419 Anxiety disorder, unspecified: Secondary | ICD-10-CM | POA: Diagnosis not present

## 2020-05-29 DIAGNOSIS — F319 Bipolar disorder, unspecified: Secondary | ICD-10-CM | POA: Diagnosis not present

## 2020-05-29 DIAGNOSIS — F4310 Post-traumatic stress disorder, unspecified: Secondary | ICD-10-CM

## 2020-05-29 NOTE — Progress Notes (Signed)
Virtual Visit via Video Note  I connected with [name] on 05/29/20 at  3:30 PM EDT by a video enabled telemedicine application and verified that I am speaking with the correct person using two identifiers.  Location: Patient: Patient Home Provider: Home Office   I discussed the limitations of evaluation and management by telemedicine and the availability of in person appointments. The patient expressed understanding and agreed to proceed.  History of Present Illness: Bipolar 1, Anxiety and PTSD   Treatment Plan Goals: 1) Rexanne would like to implement healthy habits, such as decrease sugar intake, walk daily, meal prep and plan grocery list to incorporated healthier purchases, in daily life to improve health, loose weight, and to feel more confident and less guilty.   2) Jakeia would like to improve relationship with her daughter and granddaughter, through communicating with them daily or every other day, cleaning and organizing home so they are more comfortable visiting her and through monthly face to face interactions else where.  Observations/Objective: Counselor met with Client for individual therapy via Webex. Counselor assessed MH symptoms and progress on treatment plan goals, with patient reporting on updates in life since our last session in Feb 2021. Client notes increased depression and anxiety, isolating, less physical activity, body aches, pain in body, and disconnected from reality at times. Client presents with moderate depression and moderate anxiety. Client denied suicidal ideation or self-harm behaviors.   Counselor reengaged in joining process with Client as she shared about medical issues with her beloved pet, Mimi, who she considers as her main support and friend. Client reports being very upset and anxious about her condition and thankful she has improved. Client discussed about this time of year being difficult for her due to a trauma reminder of when her favorite  cousin tragically passed in a car accident. Counselor provided truama and grief and loss coping strategies for Client. Counselor and Client spent the remainder of session discussing and developing new goals for updated treatment plan. Counselor and Client discussed frequency of upcoming appointments and treatment needs. Counselor challenged Client to take a walk in her neighborhood and to contact daughter at least once this week to reinitiate relationship.   Assessment and Plan: Counselor will continue to meet with patient to address treatment plan goals. Patient will continue to follow recommendations of providers and implement skills learned in session.  Follow Up Instructions: Counselor will send information for next session via Webex.    The patient was advised to call back or seek an in-person evaluation if the symptoms worsen or if the condition fails to improve as anticipated.  I provided 55 minutes of non-face-to-face time during this encounter.   [name], LCSW

## 2020-05-30 ENCOUNTER — Other Ambulatory Visit: Payer: Self-pay | Admitting: Family Medicine

## 2020-05-30 ENCOUNTER — Encounter (HOSPITAL_COMMUNITY): Payer: Self-pay | Admitting: Psychiatry

## 2020-05-30 DIAGNOSIS — M25572 Pain in left ankle and joints of left foot: Secondary | ICD-10-CM

## 2020-06-12 ENCOUNTER — Ambulatory Visit (INDEPENDENT_AMBULATORY_CARE_PROVIDER_SITE_OTHER): Payer: Medicare HMO | Admitting: Psychiatry

## 2020-06-12 ENCOUNTER — Encounter (HOSPITAL_COMMUNITY): Payer: Self-pay | Admitting: Psychiatry

## 2020-06-12 ENCOUNTER — Other Ambulatory Visit: Payer: Self-pay

## 2020-06-12 DIAGNOSIS — F319 Bipolar disorder, unspecified: Secondary | ICD-10-CM

## 2020-06-12 NOTE — Progress Notes (Signed)
Virtual Visit via Video Note  I connected with [name] on 06/12/20 at  3:30 PM EDT by a video enabled telemedicine application and verified that I am speaking with the correct person using two identifiers.  Location: Patient: Patient Home Provider: Home Office  I discussed the limitations of evaluation and management by telemedicine and the availability of in person appointments. The patient expressed understanding and agreed to proceed.  History of Present Illness: Bipolar 1, Anxiety and PTSD  Treatment Plan Goals: 1) Israel would like to implement healthy habits, such as decrease sugar intake, walk daily, meal prep and plan grocery list to incorporated healthier purchases, in daily life to improve health, loose weight, and to feel more confident and less guilty.   2) Geovana would like to improve relationship with her daughter and granddaughter, through communicating with them daily or every other day, cleaning and organizing home so they are more comfortable visiting her and through monthly face to face interactions else where.  Observations/Objective: Counselor met with Client for individual therapy via Webex. Counselor assessed MH symptoms and progress on treatment plan goals, with patient reporting that today is her 59th birthday, causing her to feel alone, sad and reflective. Client presents with moderate depression and moderate anxiety. Client denied suicidal ideation or self-harm behaviors.   Goal 1: Counselor acknowledged Client's birthday and discussed ways in which she can celebrate and feel celebrated today/this week. Client discussed that she had no appetite for anything in particular to eat and didn't feel like she had anyone to celebrate with other than the 2 friends that called her to wish her a happy birthday. Counselor assessed application of skills and challenges provided at the last session, with the client stating that she did not engage in healthy habit  forming or attempting to go to or get in the pool area for exercise. Client noted that she continues to purchase items that are not as healthy for her at the store, due to the comfort and taste they provide her. Client disappointed in her progress. Counselor and Client discussed focusing on one day at a time, one item, purchase, box at a time, instead of getting overwhelmed by big sweeping changes. Client felt more at ease looking at things through that lens and is willing to set smaller goals in this area.   Goal 2:  Counselor assessed progress towards growing relationship with her daughter, with client noting that they had a very close friend pass away, unexpectedly at a young age this week, causing her to have flashbacks to a happier time in her and her daughters lives. Counselor processed grief and loss feelings and memories with the client. Client very reflective and concerned that she and her daughter will never be close again and upset that her daughter seems to have forgotten the good times, instead focusing on how embarrassed and ashamed she is of her mothers state and regressed functioning. Counselor and Client discussed what areas the client can control and how she can make appropriate efforts to strengthen the relationship, through improving self and attempting to connect more, no matter her daughters response, or lack of response, while respecting her boundaries. Client addtionally asked for resources for completing a living will, with Counselor providing her information for classes with [employer] [place].   Assessment and Plan: Counselor will continue to meet with patient to address treatment plan goals. Patient will continue to follow recommendations of providers and implement skills learned in session.  Follow Up Instructions:  Counselor will send information for next session via Webex.    The patient was advised to call back or seek an in-person evaluation if the symptoms worsen or if the  condition fails to improve as anticipated.  I provided 60 minutes of non-face-to-face time during this encounter.   [name], LCSW

## 2020-06-26 ENCOUNTER — Ambulatory Visit (INDEPENDENT_AMBULATORY_CARE_PROVIDER_SITE_OTHER): Payer: Medicare HMO | Admitting: Psychiatry

## 2020-06-26 ENCOUNTER — Other Ambulatory Visit: Payer: Self-pay

## 2020-06-26 ENCOUNTER — Encounter (HOSPITAL_COMMUNITY): Payer: Self-pay | Admitting: Psychiatry

## 2020-06-26 DIAGNOSIS — F4310 Post-traumatic stress disorder, unspecified: Secondary | ICD-10-CM | POA: Diagnosis not present

## 2020-06-26 DIAGNOSIS — F319 Bipolar disorder, unspecified: Secondary | ICD-10-CM | POA: Diagnosis not present

## 2020-06-26 NOTE — Progress Notes (Signed)
Virtual Visit via Video Note  I connected with Tiffany Reese on 06/26/20 at  3:30 PM EDT by a video enabled telemedicine application and verified that I am speaking with the correct person using two identifiers.  Location: Patient: Patient Home Provider: Home Office  I discussed the limitations of evaluation and management by telemedicine and the availability of in person appointments. The patient expressed understanding and agreed to proceed.  History of Present Illness: Bipolar 1, Anxiety and PTSD  Treatment Plan Goals: 1) Tiffany Reese would like to implement healthy habits, such as decrease sugar intake, walk daily, meal prep and plan grocery list to incorporated healthier purchases, in daily life to improve health, loose weight, and to feel more confident and less guilty.   2) Tiffany Reese would like to improve relationship with her daughter and granddaughter, through communicating with them daily or every other day, cleaning and organizing home so they are more comfortable visiting her and through monthly face to face interactions else where.  Observations/Objective: Counselor met with Client for individual therapy via Webex. Counselor assessed MH symptoms and progress on treatment plan goals, client reports that she is feeling "terrible" today physically and emotionally. Client notes severe headache, inability to sleep the past two night despite efforts and taking medications as prescribed. Client attributes onset of issues to grieving the loss of a friend who passed recently which has cause PTSD symptoms to increase Client presents with severe depression and moderate anxiety. Client denied suicidal ideation or self-harm behaviors.   Goal 1: In assessing progress towards this goal Client states that she has made no efforts, regressing to old habits and giving into her depression. Counselor processed thoughts and feelings related to loss of friend that has caused her to spiral in thoughts,  getting stuck in progress. Client reports flashbacks, sadness, regret, disappointment, anxiety and reexperiencing-questioning past decisions that caused her health and lifestyle to decline. Counselor provided psychoeducation on impact of trauma and coping strategies for Client to consider implementing. Client expressed desire to be better and will attempt reaching out to passed friend's family to connect and express appreciation for friend, for corporate grieving experience.   Goal 2: Client shared that her daughter visited her on her birthday and "made her to feel bad about the condition of her home. Counselor processed thoughts and feelings experienced by the client in the interactions. Client stated that her daughter and her husband are finally understanding that her mental and physical conditions make it difficult to upkeep her home and have it to a livable/acceptiable standard. Counselor prompted the Client to identify the things she can do and manage, and alternatives to achieving this goal, such as hiring a cleaning services, in-home aid or having family come to help organize and clean. Client states she will contact daughter to make a more concrete plan to eliminate that barrier in her relationship. Client discussed grief and loss issues with downsize homes and lifestyle, difficulty in letting things go. Counselor to monitor progress in this area.  Assessment and Plan: Counselor will continue to meet with patient to address treatment plan goals. Patient will continue to follow recommendations of providers and implement skills learned in session.  Follow Up Instructions: Counselor will send information for next session via Webex.   The patient was advised to call back or seek an in-person evaluation if the symptoms worsen or if the condition fails to improve as anticipated.  I provided 55 minutes of non-face-to-face time during this encounter.   [name], LCSW

## 2020-07-02 ENCOUNTER — Other Ambulatory Visit: Payer: Self-pay

## 2020-07-02 ENCOUNTER — Encounter (HOSPITAL_COMMUNITY): Payer: Self-pay | Admitting: Psychiatry

## 2020-07-02 ENCOUNTER — Telehealth (INDEPENDENT_AMBULATORY_CARE_PROVIDER_SITE_OTHER): Payer: Medicare HMO | Admitting: Psychiatry

## 2020-07-02 DIAGNOSIS — F419 Anxiety disorder, unspecified: Secondary | ICD-10-CM | POA: Diagnosis not present

## 2020-07-02 DIAGNOSIS — F4310 Post-traumatic stress disorder, unspecified: Secondary | ICD-10-CM

## 2020-07-02 DIAGNOSIS — F319 Bipolar disorder, unspecified: Secondary | ICD-10-CM

## 2020-07-02 MED ORDER — AMITRIPTYLINE HCL 150 MG PO TABS: 150.0000 mg | ORAL_TABLET | Freq: Every day | ORAL | 1 refills | Status: DC

## 2020-07-02 MED ORDER — LURASIDONE HCL 80 MG PO TABS: 80.0000 mg | ORAL_TABLET | Freq: Every day | ORAL | 1 refills | Status: DC

## 2020-07-02 MED ORDER — DIAZEPAM 2 MG PO TABS: ORAL_TABLET | ORAL | 1 refills | Status: DC
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
## 2020-07-18 ENCOUNTER — Ambulatory Visit (INDEPENDENT_AMBULATORY_CARE_PROVIDER_SITE_OTHER): Payer: Medicare HMO | Admitting: Psychiatry

## 2020-07-18 ENCOUNTER — Other Ambulatory Visit: Payer: Self-pay

## 2020-07-18 ENCOUNTER — Other Ambulatory Visit: Payer: Self-pay | Admitting: Family Medicine

## 2020-07-18 ENCOUNTER — Encounter (HOSPITAL_COMMUNITY): Payer: Self-pay | Admitting: Psychiatry

## 2020-07-18 DIAGNOSIS — F319 Bipolar disorder, unspecified: Secondary | ICD-10-CM | POA: Diagnosis not present

## 2020-07-18 NOTE — Progress Notes (Signed)
Virtual Visit via Video Note  I connected with Tiffany Reese on 07/18/20 at  1:30 PM EDT by a video enabled telemedicine application and verified that I am speaking with the correct person using two identifiers.  Location: Patient: Patient Home Provider: Home Office  I discussed the limitations of evaluation and management by telemedicine and the availability of in person appointments. The patient expressed understanding and agreed to proceed.  History of Present Illness: Bipolar 1, Anxiety and PTSD  Treatment Plan Goals: 1)Tommy would like to implement healthy habits, such as decrease sugar intake, walk daily, meal prep and plan grocery list to incorporated healthier purchases, in daily life to improve health, loose weight, and to feel more confident and less guilty.  2)Tomesha would like to improve relationship with her daughter and granddaughter, through communicating with them daily or every other day, cleaning and organizing home so they are more comfortable visiting her and through monthly face to face interactions else where.  Observations/Objective: Counselor met with Client for individual therapy via Webex. Counselor assessed MH symptoms and progress on treatment plan goals, client reports that she recently she has been leaning on her faith for progress, stating that, "I pray to [place], Help Me Help Myself". Client notes lack of motivation and drive to make a change in her life and situation. Client was distractible during the session, fixated on issues of technology, making it difficult to process information, despite redirection from counselor. Client presents with severedepression and moderateanxiety. Client denied suicidal ideation or self-harm behaviors.   Goal 1: Client reports no progress in this area. Client states that she discussed need for help from daughter, with a discouraging response. Counselor processed thoughts and feelings using CBT interventions and MI  interventions to assess motivation to change. Client remains unmotivated and overwhelmed by situation. Counselor provided a series of worksheets for client to complete for homework on thought challenging and problem solving.  Goal 2: Counselor assessed progress in relationship building with daughter. Client reports that the state of her home is prohibiting her daughter for visiting more often. In last conversation, Client reports daughter stating that, "you are out of sight and out of mind." Client visibly upset by comments. Counselor validated feelings and challenged behaviors.  Assessment and Plan: Counselor will continue to meet with patient to address treatment plan goals. Patient will continue to follow recommendations of providers and implement skills learned in session.  Follow Up Instructions: Counselor will send information for next session via Webex.   The patient was advised to call back or seek an in-person evaluation if the symptoms worsen or if the condition fails to improve as anticipated.  I provided 55 minutes of non-face-to-face time during this encounter.   [name], LCSW

## 2020-07-20 ENCOUNTER — Other Ambulatory Visit: Payer: Self-pay | Admitting: Family Medicine

## 2020-07-20 DIAGNOSIS — M797 Fibromyalgia: Secondary | ICD-10-CM

## 2020-07-24 NOTE — Telephone Encounter (Signed)
She is due for follow up visit in December.

## 2020-07-25 ENCOUNTER — Ambulatory Visit (INDEPENDENT_AMBULATORY_CARE_PROVIDER_SITE_OTHER): Payer: Medicare HMO | Admitting: Psychiatry

## 2020-07-25 ENCOUNTER — Other Ambulatory Visit: Payer: Self-pay

## 2020-07-25 DIAGNOSIS — F319 Bipolar disorder, unspecified: Secondary | ICD-10-CM

## 2020-07-26 ENCOUNTER — Encounter (HOSPITAL_COMMUNITY): Payer: Self-pay | Admitting: Psychiatry

## 2020-07-26 NOTE — Progress Notes (Signed)
Virtual Visit via Video Note  I connected with Glorene Leitzke Tippets on 07/26/20 at  1:30 PM EDT by a video enabled telemedicine application and verified that I am speaking with the correct person using two identifiers.  Location: Patient: Patient Home Provider: Home Office  I discussed the limitations of evaluation and management by telemedicine and the availability of in person appointments. The patient expressed understanding and agreed to proceed.  History of Present Illness: Bipolar 1, Anxiety and PTSD  Treatment Plan Goals: 1)Ruchy would like to implement healthy habits, such as decrease sugar intake, walk daily, meal prep and plan grocery list to incorporated healthier purchases, in daily life to improve health, loose weight, and to feel more confident and less guilty.  2)Raylynn would like to improve relationship with her daughter and granddaughter, through communicating with them daily or every other day, cleaning and organizing home so they are more comfortable visiting her and through monthly face to face interactions else where.  Observations/Objective: Counselor met with Client for individual therapy via Webex. Counselor assessed MH symptoms and progress on treatment plan goals,client reports that she has been "very busy with caring for Mimi," her prized pet dog who has been severely ill. Client preoccupied with this life stressor throughout session. Client presents withmoderatedepression and severeanxiety. Client denied suicidal ideation or self-harm behaviors.  Goal 1: Counselor assessed progress towards goal, with client stating that she has not been able to focus on personal needs, due to caring for her sick dog. Client remarks that her pet is her best friend and like a child to her. Client very concerned about loosing her pet. Counselor processed thoughts and feelings associated with behaviors using CBT interventions. Client noted that she has been utilizing faith  practices to cope with fears of loosing pet. Counselor explored these practices with client to assess application and value in recovery.   Assessment and Plan: Counselor will continue to meet with patient to address treatment plan goals. Patient will continue to follow recommendations of providers and implement skills learned in session.  Follow Up Instructions: Counselor will send information for next session via Webex.   The patient was advised to call back or seek an in-person evaluation if the symptoms worsen or if the condition fails to improve as anticipated.  I provided 40 minutes of non-face-to-face time during this encounter.   [name], LCSW

## 2020-08-01 ENCOUNTER — Other Ambulatory Visit: Payer: Self-pay | Admitting: Family Medicine

## 2020-08-01 DIAGNOSIS — M25572 Pain in left ankle and joints of left foot: Secondary | ICD-10-CM

## 2020-08-08 ENCOUNTER — Ambulatory Visit (INDEPENDENT_AMBULATORY_CARE_PROVIDER_SITE_OTHER): Payer: Medicare HMO | Admitting: Psychiatry

## 2020-08-08 ENCOUNTER — Other Ambulatory Visit: Payer: Self-pay

## 2020-08-08 ENCOUNTER — Encounter (HOSPITAL_COMMUNITY): Payer: Self-pay | Admitting: Psychiatry

## 2020-08-08 DIAGNOSIS — F419 Anxiety disorder, unspecified: Secondary | ICD-10-CM | POA: Diagnosis not present

## 2020-08-08 NOTE — Progress Notes (Signed)
Virtual Visit via Video Note  I connected with Tiffany Reese on 08/08/20 at  2:30 PM EST by a video enabled telemedicine application and verified that I am speaking with the correct person using two identifiers.  Location: Patient: Patient Home Provider: Home Office  I discussed the limitations of evaluation and management by telemedicine and the availability of in person appointments. The patient expressed understanding and agreed to proceed.  History of Present Illness: Bipolar 1, Anxiety and PTSD  Treatment Plan Goals: 1)Tiffany Reese would like to implement healthy habits, such as decrease sugar intake, walk daily, meal prep and plan grocery list to incorporated healthier purchases, in daily life to improve health, loose weight, and to feel more confident and less guilty.  2)Tiffany Reese would like to improve relationship with her daughter and granddaughter, through communicating with them daily or every other day, cleaning and organizing home so they are more comfortable visiting her and through monthly face to face interactions else where.  Observations/Objective: Counselor met with Client for individual therapy via Webex. Counselor assessed MH symptoms and progress on treatment plan goals,client reportsbeing upset at the present by changes occurring in her neighborhood. Client presents withmoderatedepression and severeanxiety. Client denied suicidal ideation or self-harm behaviors.  Goal 1:Counselor assessed progress on goal, with Client noting that she has not re-implemented healthy habits to her routine. Counselor and Client worked toward creating a list and action steps towards shifting behaviors and thought patterns to a more healthy lifestyle. Client was very pre-occupied with changes being made within her community that she was opposed to. Client shifting and diverting work towards goal several times in session to focus on changes she was viewing from her window. Counselor  made efforts to keep focus on session without success.   Goal 2: Client notes that she and her daughter continue to communicate infrequently. Client saddened and lonely with the lack of communication and relationship development with her daughter and daughters family. Client discussed potiential emotional challenges with coming holidays. Counselor prompted Client to identify ways in which she can bring meaning and fulfillment in the coming holiday season with things within her sphere of control. Client stated she would attempt to keep these as her focus.   Assessment and Plan: Counselor will continue to meet with patient to address treatment plan goals. Patient will continue to follow recommendations of providers and implement skills learned in session.  Follow Up Instructions: Counselor will send information for next session via Webex.   The patient was advised to call back or seek an in-person evaluation if the symptoms worsen or if the condition fails to improve as anticipated.  I provided 40 minutes of non-face-to-face time during this encounter.   [name], LCSW

## 2020-08-28 ENCOUNTER — Other Ambulatory Visit: Payer: Self-pay

## 2020-08-28 ENCOUNTER — Ambulatory Visit (INDEPENDENT_AMBULATORY_CARE_PROVIDER_SITE_OTHER): Payer: Medicare HMO | Admitting: Psychiatry

## 2020-08-28 ENCOUNTER — Telehealth (HOSPITAL_COMMUNITY): Payer: Medicare HMO | Admitting: Psychiatry

## 2020-08-28 ENCOUNTER — Encounter (HOSPITAL_COMMUNITY): Payer: Self-pay | Admitting: Psychiatry

## 2020-08-28 DIAGNOSIS — F419 Anxiety disorder, unspecified: Secondary | ICD-10-CM

## 2020-08-28 NOTE — Progress Notes (Signed)
Virtual Visit via Video Note  I connected with Lashia Niese Mcclintic on 08/28/20 at  3:30 PM EST by a video enabled telemedicine application and verified that I am speaking with the correct person using two identifiers.  Location: Patient: Patient Home Provider: Home Office  I discussed the limitations of evaluation and management by telemedicine and the availability of in person appointments. The patient expressed understanding and agreed to proceed.  History of Present Illness: Bipolar 1, Anxiety and PTSD  Treatment Plan Goals: 1)Kayte would like to implement healthy habits, such as decrease sugar intake, walk daily, meal prep and plan grocery list to incorporated healthier purchases, in daily life to improve health, loose weight, and to feel more confident and less guilty.  2)Morghan would like to improve relationship with her daughter and granddaughter, through communicating with them daily or every other day, cleaning and organizing home so they are more comfortable visiting her and through monthly face to face interactions else where.  Observations/Objective: Counselor met with Client for individual therapy via Webex. Counselor assessed MH symptoms and progress on treatment plan goals,client reportsanxiety, sadness, lack of motivation, hopelessness, helplessness and frustration.Client presents withmoderatedepression andsevereanxiety. Client denied suicidal ideation or self-harm behaviors.  Goal 1:Counselor assessed progress on goal, with Client reporting that she feels "stuck", is un motivated and overwhelmed with the amount of life changes and improvements she needs to make. Client proceeded to break down in tears sharing about the fear she has of losing her dog of 14+ years, who she describes as her only friend and companion, the only one who loves her and that she is allowed to love. Counselor validated clients feelings and gave regulating prompts. Client able to  self-regulate in order to process thoughts and feelings.   Goal 2: Client reports no improvements in her relationship with her daughter. Client discussed about being stuck in the past, wanting to start over, do things differently, and to get her old life back. Counselor and client identified the peaks and valleys in her life and mental health. Client stating that "I have nothing now, no identity, nothing to look forward to." Counselor assessed safety and depressive thoughts, with client stating that she is not having suicidal or intrusive thoughts to harm self or end life. Client proceeded to share about recent conversation with daughter which highlighted the depth of their emotional block and issues. Counselor provided homework for client to reflect on and to write out thoughts and feelings to report back at next session. Counselor to reach out to medication provider to discuss symptom reduction through medication.      Assessment and Plan: Counselor will continue to meet with patient to address treatment plan goals. Patient will continue to follow recommendations of providers and implement skills learned in session.  Follow Up Instructions: Counselor will send information for next session via Webex.   The patient was advised to call back or seek an in-person evaluation if the symptoms worsen or if the condition fails to improve as anticipated.  I provided60 minutes of non-face-to-face time during this encounter.   [name], LCSW

## 2020-08-29 ENCOUNTER — Telehealth (HOSPITAL_COMMUNITY): Payer: Medicare HMO | Admitting: Psychiatry

## 2020-09-02 ENCOUNTER — Other Ambulatory Visit: Payer: Self-pay | Admitting: Family Medicine

## 2020-09-02 DIAGNOSIS — M5441 Lumbago with sciatica, right side: Secondary | ICD-10-CM

## 2020-09-02 DIAGNOSIS — G8929 Other chronic pain: Secondary | ICD-10-CM

## 2020-09-03 NOTE — Telephone Encounter (Signed)
Last OV 02/27/20 Last refill 02/27/20 #270/1 Next OV not scheduled

## 2020-09-05 ENCOUNTER — Other Ambulatory Visit: Payer: Self-pay | Admitting: Family Medicine

## 2020-09-06 ENCOUNTER — Other Ambulatory Visit: Payer: Self-pay | Admitting: Family Medicine

## 2020-09-10 NOTE — Telephone Encounter (Signed)
She needs a follow-up visit scheduled.  She is overdue.

## 2020-09-12 ENCOUNTER — Encounter (HOSPITAL_COMMUNITY): Payer: Self-pay | Admitting: Psychiatry

## 2020-09-12 ENCOUNTER — Other Ambulatory Visit: Payer: Self-pay

## 2020-09-12 ENCOUNTER — Ambulatory Visit (INDEPENDENT_AMBULATORY_CARE_PROVIDER_SITE_OTHER): Payer: Medicare HMO | Admitting: Psychiatry

## 2020-09-12 DIAGNOSIS — F4310 Post-traumatic stress disorder, unspecified: Secondary | ICD-10-CM | POA: Diagnosis not present

## 2020-09-12 NOTE — Progress Notes (Signed)
Virtual Visit via Video Note  I connected with Tiffany Reese on 09/12/20 at  1:30 PM EST by a video enabled telemedicine application and verified that I am speaking with the correct person using two identifiers.  Location: Patient: Patient Home Provider: Home Office  I discussed the limitations of evaluation and management by telemedicine and the availability of in person appointments. The patient expressed understanding and agreed to proceed.  History of Present Illness: Bipolar 1, Anxiety and PTSD  Treatment Plan Goals: 1)Tiffany Reese would like to implement healthy habits, such as decrease sugar intake, walk daily, meal prep and plan grocery list to incorporated healthier purchases, in daily life to improve health, loose weight, and to feel more confident and less guilty.  2)Tiffany Reese would like to improve relationship with her daughter and granddaughter, through communicating with them daily or every other day, cleaning and organizing home so they are more comfortable visiting her and through monthly face to face interactions else where.  Observations/Objective: Counselor met with Client for individual therapy via Webex. Counselor assessed MH symptoms and progress on treatment plan goals,client reports that she is feeling the best she has in a year, because she was able to spend time with her granddaughter and daughter for a holiday gathering.Client presents withmoderatedepression andmoderateanxiety. Client denied suicidal ideation or self-harm behaviors.  Goal 2:Client reports that she spent 4 hours at her daughters home over the weekend, which she hasn't been invited to in over a year. Counselor processed thoughts and feelings using CBT interventions as the Client shared about her experience. Client noted areas of clarity and improvement in communication/relatioships. Client reports that she addressed "hurt spots" head on with her daughter and was validated by her son-in-laws  responses/communication. Client proceeded to have reflections on her late grandmother, sharing the similarities and differences between the 5 generations of women/girls. Client reports being more hopeful and plans to be more direct about her needs and intentions to spend time together face to face more frequently in the coming year.  Assessment and Plan: Counselor will continue to meet with patient to address treatment plan goals. Patient will continue to follow recommendations of providers and implement skills learned in session.  Follow Up Instructions: Counselor will send information for next session via Webex.   The patient was advised to call back or seek an in-person evaluation if the symptoms worsen or if the condition fails to improve as anticipated.  I provided60 minutes of non-face-to-face time during this encounter.   [name], LCSW

## 2020-09-16 ENCOUNTER — Other Ambulatory Visit (HOSPITAL_COMMUNITY): Payer: Self-pay | Admitting: Psychiatry

## 2020-09-16 DIAGNOSIS — F4310 Post-traumatic stress disorder, unspecified: Secondary | ICD-10-CM

## 2020-09-16 DIAGNOSIS — F419 Anxiety disorder, unspecified: Secondary | ICD-10-CM

## 2020-09-17 NOTE — Telephone Encounter (Signed)
Appt scheduled for 10/31/2020 to arrive at 2:15pm.

## 2020-09-18 ENCOUNTER — Encounter (HOSPITAL_COMMUNITY): Payer: Self-pay | Admitting: Psychiatry

## 2020-09-18 ENCOUNTER — Telehealth (INDEPENDENT_AMBULATORY_CARE_PROVIDER_SITE_OTHER): Payer: Medicare HMO | Admitting: Psychiatry

## 2020-09-18 ENCOUNTER — Other Ambulatory Visit: Payer: Self-pay

## 2020-09-18 DIAGNOSIS — F4310 Post-traumatic stress disorder, unspecified: Secondary | ICD-10-CM

## 2020-09-18 DIAGNOSIS — F419 Anxiety disorder, unspecified: Secondary | ICD-10-CM | POA: Diagnosis not present

## 2020-09-18 DIAGNOSIS — F319 Bipolar disorder, unspecified: Secondary | ICD-10-CM | POA: Diagnosis not present

## 2020-09-18 MED ORDER — AMITRIPTYLINE HCL 150 MG PO TABS: 150.0000 mg | ORAL_TABLET | Freq: Every day | ORAL | 1 refills | Status: DC

## 2020-09-18 MED ORDER — DIAZEPAM 2 MG PO TABS: ORAL_TABLET | ORAL | 1 refills | Status: DC

## 2020-09-18 MED ORDER — LURASIDONE HCL 80 MG PO TABS: 80.0000 mg | ORAL_TABLET | Freq: Every day | ORAL | 1 refills | Status: DC
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
## 2020-09-26 ENCOUNTER — Other Ambulatory Visit: Payer: Self-pay

## 2020-09-26 ENCOUNTER — Encounter (HOSPITAL_COMMUNITY): Payer: Self-pay | Admitting: Psychiatry

## 2020-09-26 ENCOUNTER — Ambulatory Visit (INDEPENDENT_AMBULATORY_CARE_PROVIDER_SITE_OTHER): Payer: Medicare HMO | Admitting: Psychiatry

## 2020-09-26 DIAGNOSIS — F419 Anxiety disorder, unspecified: Secondary | ICD-10-CM

## 2020-09-26 DIAGNOSIS — F4310 Post-traumatic stress disorder, unspecified: Secondary | ICD-10-CM

## 2020-09-26 NOTE — Progress Notes (Signed)
Virtual Visit via Video Note  I connected with Tiffany Reese on 09/26/20 at  2:30 PM EST by a video enabled telemedicine application and verified that I am speaking with the correct person using two identifiers.  Location: Patient: Patient Home Provider: Home Office  I discussed the limitations of evaluation and management by telemedicine and the availability of in person appointments. The patient expressed understanding and agreed to proceed.  History of Present Illness: Bipolar 1, Anxiety and PTSD  Treatment Plan Goals: 1)Tiffany Reese would like to implement healthy habits, such as decrease sugar intake, walk daily, meal prep and plan grocery list to incorporated healthier purchases, in daily life to improve health, loose weight, and to feel more confident and less guilty.  2)Tiffany Reese would like to improve relationship with her daughter and granddaughter, through communicating with them daily or every other day, cleaning and organizing home so they are more comfortable visiting her and through monthly face to face interactions else where.  Observations/Objective: Counselor met with Client for individual therapy via Webex. Counselor assessed MH symptoms and progress on treatment plan goals,client reports that she recently she has been leaning on her faith for progress, stating that, "I pray to [place], Help Me Help Myself". Client notes lack of motivation and drive to make a change in her life and situation. Client was distractible during the session, fixated on issues of technology, making it difficult to process information, despite redirection from counselor. Client presents withseveredepression and moderateanxiety. Client denied suicidal ideation or self-harm behaviors.  Goal 1: Client reports no progress in this area. Client states that she discussed need for help from daughter, with a discouraging response. Counselor processed thoughts and feelings using CBT interventions and MI  interventions to assess motivation to change. Client remains unmotivated and overwhelmed by situation. Counselor provided a series of worksheets for client to complete for homework on thought challenging and problem solving.  Goal 2: Counselor assessed progress in relationship building with daughter. Client reports that the state of her home is prohibiting her daughter for visiting more often. In last conversation, Client reports daughter stating that, "you are out of sight and out of mind." Client visibly upset by comments. Counselor validated feelings and challenged behaviors.  Assessment and Plan: Counselor will continue to meet with patient to address treatment plan goals. Patient will continue to follow recommendations of providers and implement skills learned in session.  Follow Up Instructions: Counselor will send information for next session via Webex.   The patient was advised to call back or seek an in-person evaluation if the symptoms worsen or if the condition fails to improve as anticipated.  I provided 55 minutes of non-face-to-face time during this encounter.   [name], LCSW

## 2020-09-27 IMAGING — US US ABDOMEN LIMITED
1 series · 14 of 25 positions shown · non-contrast
Comparison: None.

CLINICAL DATA: Elevated liver function test.

EXAM:
ULTRASOUND ABDOMEN LIMITED RIGHT UPPER QUADRANT

[Series 1: us abdomen limited · 0.26mm/px · 14 of 45 slices shown]
[im 1/45]
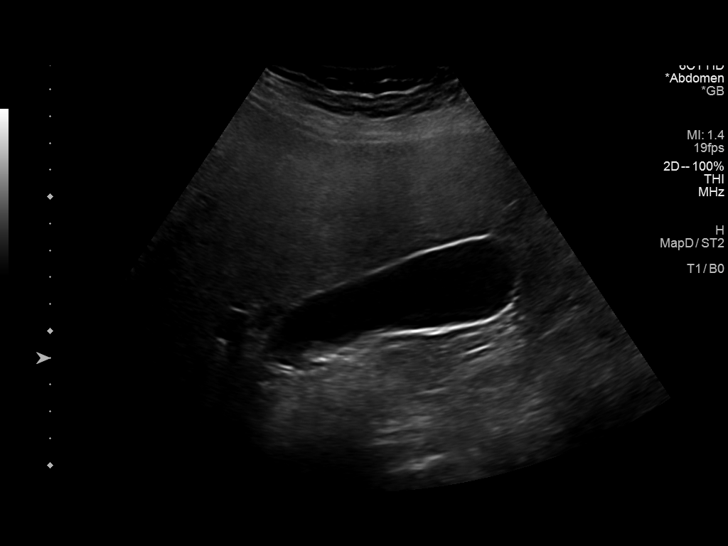
[im 4/45]
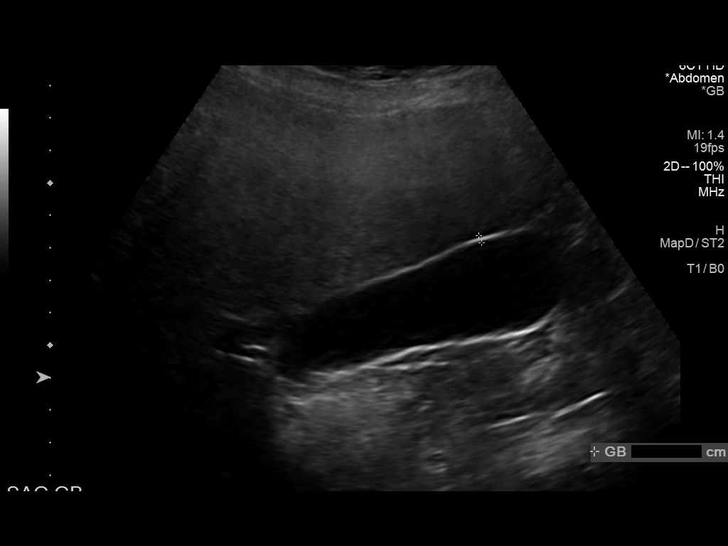
[im 8/45]
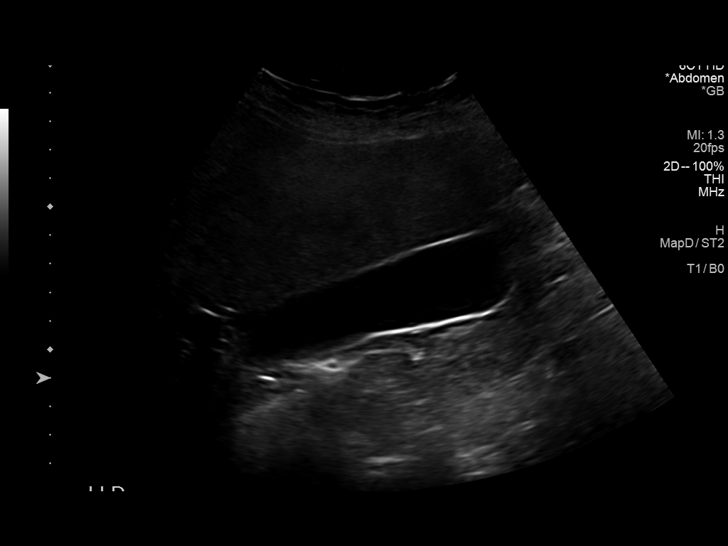
[im 12/45]
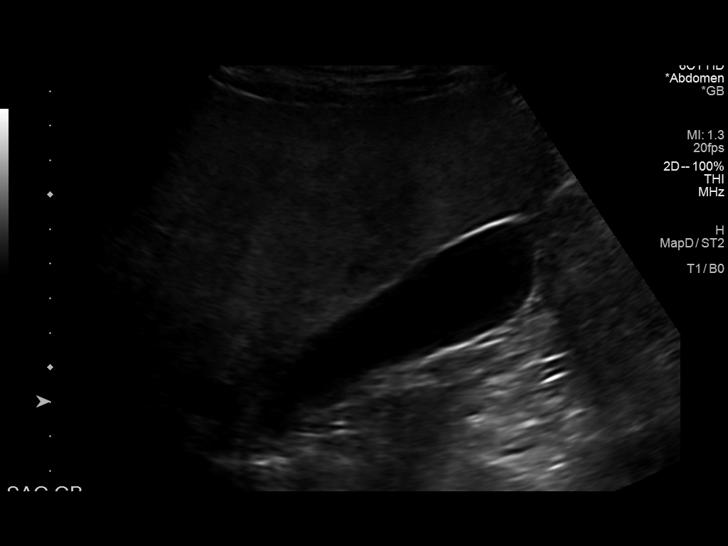
[im 15/45]
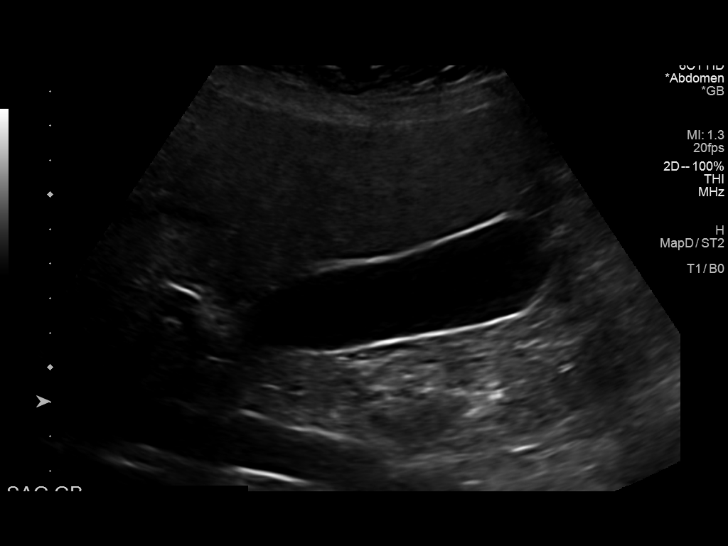
[im 17/45]
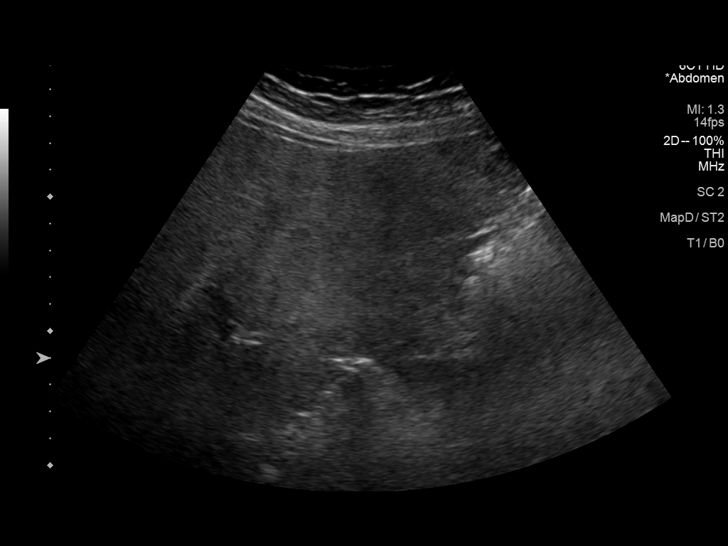
[im 21/45]
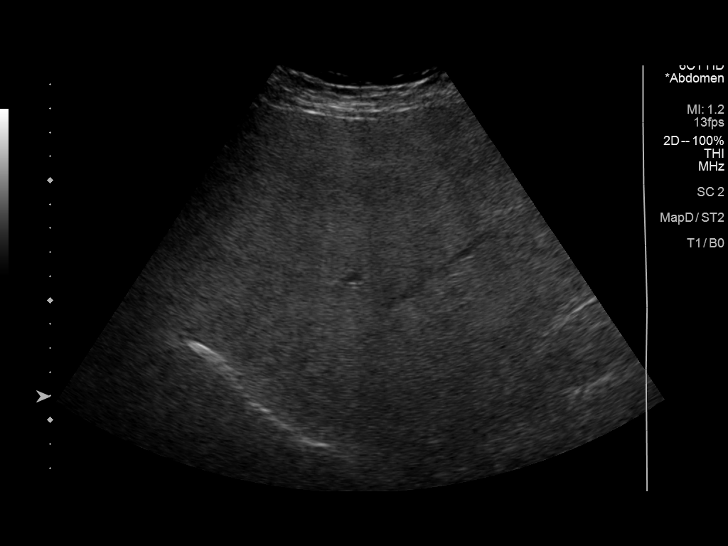
[im 24/45]
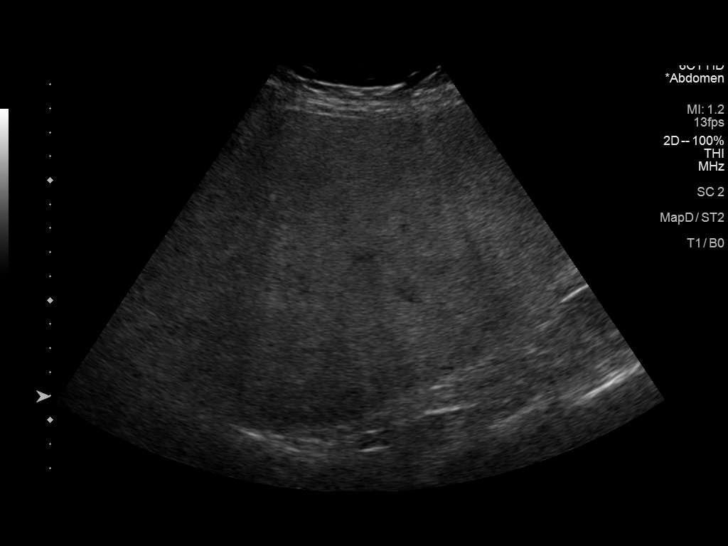
[im 28/45]
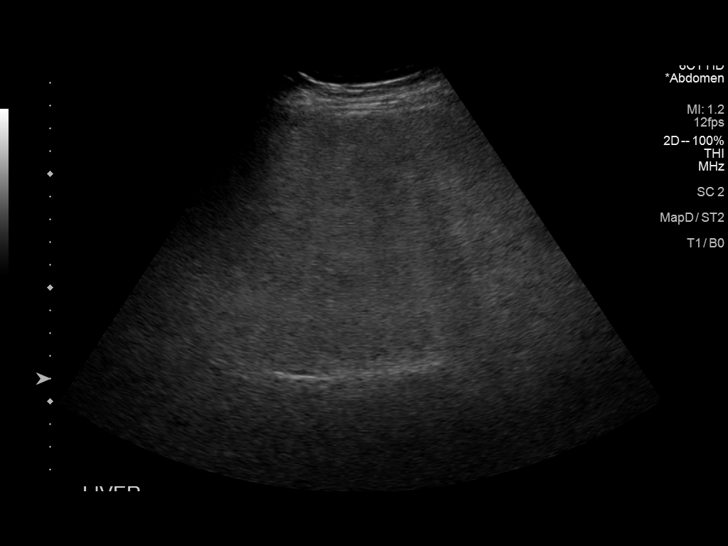
[im 30/45]
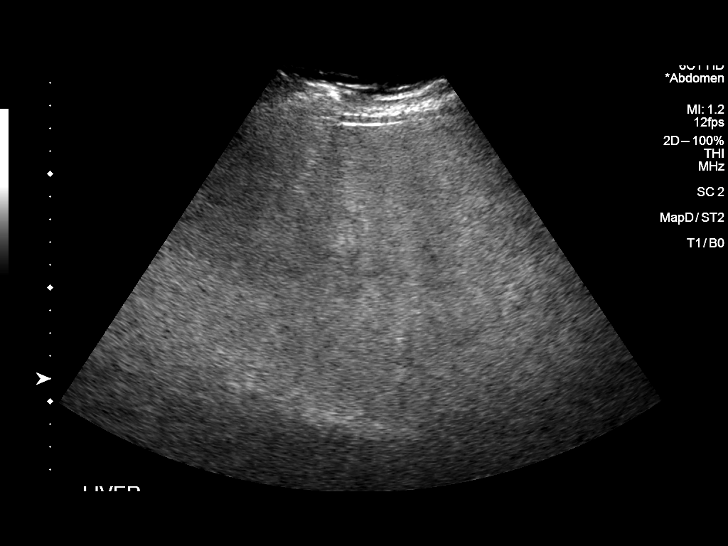
[im 34/45]
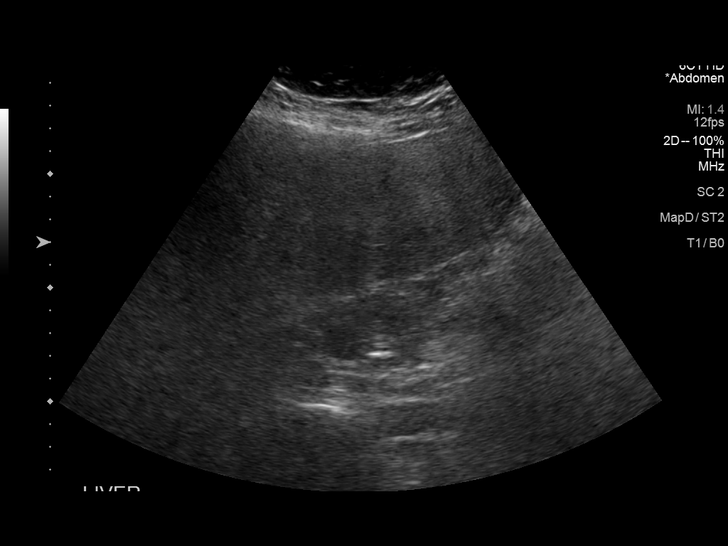
[im 37/45]
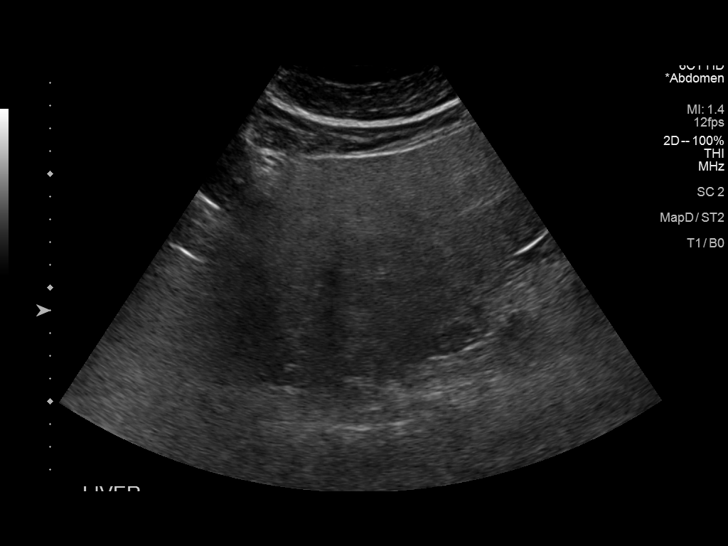
[im 41/45]
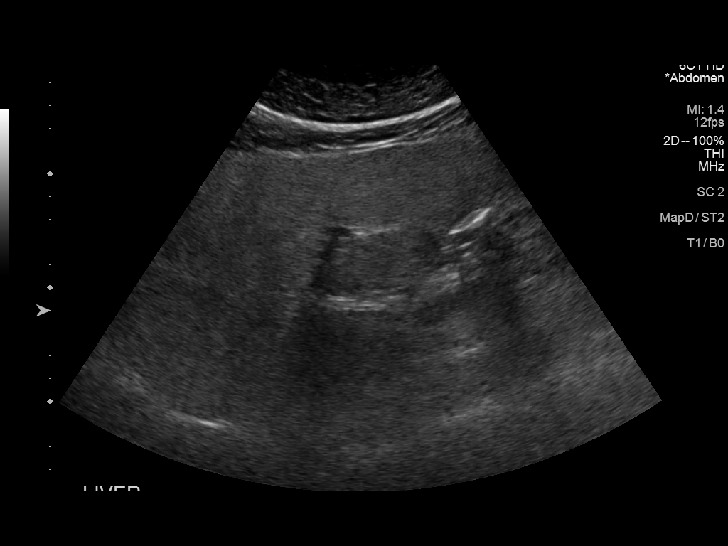
[im 45/45]
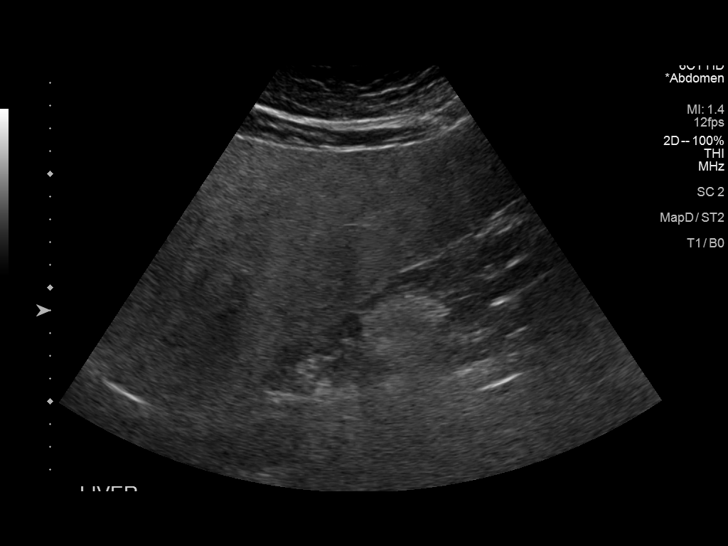

[14 of 25 positions shown; findings below may reference images not displayed]

FINDINGS: Gallbladder:

No gallstones or wall thickening visualized (2.4 mm). No sonographic
Murphy sign noted by sonographer.

Common bile duct:

Diameter: 2.2 mm

Liver:

No focal lesion identified. There is diffusely increased
echogenicity of the liver parenchyma. Portal vein is patent on color
Doppler imaging with normal direction of blood flow towards the
liver.

Other: None.
IMPRESSION: Fatty liver.

## 2020-10-01 ENCOUNTER — Other Ambulatory Visit: Payer: Self-pay | Admitting: Family Medicine

## 2020-10-01 DIAGNOSIS — M25572 Pain in left ankle and joints of left foot: Secondary | ICD-10-CM

## 2020-10-02 NOTE — Telephone Encounter (Signed)
Left a detailed message at the pts home number with the information below. 

## 2020-10-02 NOTE — Telephone Encounter (Signed)
Please advise patient to limit use of diclofenac when able. I do worry about this causing GI issues with continued use. We can discuss at her upcoming visit, but as I am refilling this I want to have her try to limit dose when able.

## 2020-10-09 ENCOUNTER — Encounter (HOSPITAL_COMMUNITY): Payer: Self-pay | Admitting: Psychiatry

## 2020-10-09 ENCOUNTER — Other Ambulatory Visit: Payer: Self-pay

## 2020-10-09 ENCOUNTER — Ambulatory Visit (INDEPENDENT_AMBULATORY_CARE_PROVIDER_SITE_OTHER): Payer: Medicare HMO | Admitting: Psychiatry

## 2020-10-09 DIAGNOSIS — F419 Anxiety disorder, unspecified: Secondary | ICD-10-CM | POA: Diagnosis not present

## 2020-10-09 NOTE — Progress Notes (Signed)
Virtual Visit via Video Note  I connected with Tiffany Reese on 10/09/20 at  3:30 PM EST by a video enabled telemedicine application and verified that I am speaking with the correct person using two identifiers.  Location: Patient: Patient Home Provider: Home Office  I discussed the limitations of evaluation and management by telemedicine and the availability of in person appointments. The patient expressed understanding and agreed to proceed.  History of Present Illness: Bipolar 1, Anxiety and PTSD  Treatment Plan Goals: 1)Tiffany Reese would like to implement healthy habits, such as decrease sugar intake, walk daily, meal prep and plan grocery list to incorporated healthier purchases, in daily life to improve health, loose weight, and to feel more confident and less guilty.  2)Tiffany Reese would like to improve relationship with her daughter and granddaughter, through communicating with them daily or every other day, cleaning and organizing home so they are more comfortable visiting her and through monthly face to face interactions else where.  Observations/Objective: Counselor met with Client for individual therapy via Webex. Counselor assessed MH symptoms and progress on treatment plan goals,client reportsthat she has mixed feelings about the winter weather and being confined to her home. Client presents as more animated and energetic. Client presents withmoderatedepression and moderateanxiety. Client denied suicidal ideation or self-harm behaviors.  Goal 1:Counselor assessed daily functioning and implementation of strategies. Client noted that she had energy to clean her kitchen well over the past few days. Counselor praised Client for her efforts. Counselor and Client discussed why other areas in the home are difficult to clean or manage. Client shared that the mail is overwhelming for her. Client and Counselor made plan for client to store mail closer to where she sits during the  day, to have better access to read over when she has downtime. Client shared that she had been in the same clothes for 3 days. Counselor challenged Client to take a shower today and choose new outfit to improve self-care practices. Client open to completing activity.  Goal 2:Counselor assessed progress in relationship building with daughter. Client shared that she and her daughter have has minimal contact since Christmas get together. Client has reached out in an attempt to connect, but daughter has been busy. Client discussed past memories of enjoyment with daughter in winter weather and we explored a variety of identities she developed over the years associated with her upbringing and role as a mother. Client plans to continue contacting daughter weekly to show intention about connecting deeper.  Assessment and Plan: Counselor will continue to meet with patient to address treatment plan goals. Patient will continue to follow recommendations of providers and implement skills learned in session.  Follow Up Instructions: Counselor will send information for next session via Webex.   The patient was advised to call back or seek an in-person evaluation if the symptoms worsen or if the condition fails to improve as anticipated.  I provided 55 minutes of non-face-to-face time during this encounter.   [name], LCSW

## 2020-10-12 ENCOUNTER — Other Ambulatory Visit: Payer: Self-pay | Admitting: Family Medicine

## 2020-10-15 ENCOUNTER — Other Ambulatory Visit: Payer: Self-pay | Admitting: Family Medicine

## 2020-10-15 DIAGNOSIS — M797 Fibromyalgia: Secondary | ICD-10-CM

## 2020-10-16 MED ORDER — OMEPRAZOLE 40 MG PO CPDR: DELAYED_RELEASE_CAPSULE | ORAL | 0 refills | Status: DC

## 2020-10-16 NOTE — Addendum Note (Signed)
Addended by: Johnella Moloney on: 10/16/2020 08:15 AM   Modules accepted: Orders

## 2020-10-22 ENCOUNTER — Other Ambulatory Visit: Payer: Self-pay | Admitting: Family Medicine

## 2020-10-22 DIAGNOSIS — G8929 Other chronic pain: Secondary | ICD-10-CM

## 2020-10-23 ENCOUNTER — Ambulatory Visit (HOSPITAL_COMMUNITY): Payer: Medicare HMO | Admitting: Psychiatry

## 2020-10-23 ENCOUNTER — Encounter (HOSPITAL_COMMUNITY): Payer: Self-pay | Admitting: Psychiatry

## 2020-10-23 ENCOUNTER — Other Ambulatory Visit: Payer: Self-pay

## 2020-10-23 DIAGNOSIS — F419 Anxiety disorder, unspecified: Secondary | ICD-10-CM

## 2020-10-23 NOTE — Progress Notes (Signed)
Counselor contacted Client who stated that she was experiencing a severe headache, causing her to be sensitive to sound and light. She requested that we reschedule today's appointment. Client confirmed she was safe and stable otherwise.   Hilbert Odor, LCSW

## 2020-10-31 ENCOUNTER — Ambulatory Visit (INDEPENDENT_AMBULATORY_CARE_PROVIDER_SITE_OTHER): Payer: Medicare HMO | Admitting: Family Medicine

## 2020-10-31 ENCOUNTER — Encounter: Payer: Self-pay | Admitting: Family Medicine

## 2020-10-31 ENCOUNTER — Other Ambulatory Visit: Payer: Self-pay

## 2020-10-31 VITALS — BP 120/82 | HR 90 | Temp 97.7°F | Ht 66.0 in | Wt 287.5 lb

## 2020-10-31 DIAGNOSIS — F4310 Post-traumatic stress disorder, unspecified: Secondary | ICD-10-CM

## 2020-10-31 DIAGNOSIS — K219 Gastro-esophageal reflux disease without esophagitis: Secondary | ICD-10-CM | POA: Diagnosis not present

## 2020-10-31 DIAGNOSIS — M5135 Other intervertebral disc degeneration, thoracolumbar region: Secondary | ICD-10-CM

## 2020-10-31 DIAGNOSIS — E039 Hypothyroidism, unspecified: Secondary | ICD-10-CM

## 2020-10-31 DIAGNOSIS — E118 Type 2 diabetes mellitus with unspecified complications: Secondary | ICD-10-CM

## 2020-10-31 DIAGNOSIS — E785 Hyperlipidemia, unspecified: Secondary | ICD-10-CM

## 2020-10-31 DIAGNOSIS — G894 Chronic pain syndrome: Secondary | ICD-10-CM | POA: Diagnosis not present

## 2020-10-31 DIAGNOSIS — E790 Hyperuricemia without signs of inflammatory arthritis and tophaceous disease: Secondary | ICD-10-CM

## 2020-10-31 DIAGNOSIS — F339 Major depressive disorder, recurrent, unspecified: Secondary | ICD-10-CM | POA: Diagnosis not present

## 2020-10-31 DIAGNOSIS — R748 Abnormal levels of other serum enzymes: Secondary | ICD-10-CM | POA: Diagnosis not present

## 2020-10-31 DIAGNOSIS — I10 Essential (primary) hypertension: Secondary | ICD-10-CM

## 2020-10-31 DIAGNOSIS — E1169 Type 2 diabetes mellitus with other specified complication: Secondary | ICD-10-CM | POA: Diagnosis not present

## 2020-10-31 MED ORDER — FAMOTIDINE 40 MG PO TABS: 40.0000 mg | ORAL_TABLET | Freq: Every day | ORAL | 1 refills | Status: DC

## 2020-10-31 MED ORDER — OMEPRAZOLE 40 MG PO CPDR: 40.0000 mg | DELAYED_RELEASE_CAPSULE | Freq: Every day | ORAL | 1 refills | Status: DC

## 2020-10-31 MED ORDER — TRAMADOL HCL 50 MG PO TABS: 50.0000 mg | ORAL_TABLET | Freq: Every day | ORAL | 0 refills | Status: AC | PRN

## 2020-10-31 NOTE — Progress Notes (Signed)
Tiffany Reese DOB: 08-Nov-1960 Encounter date: 10/31/2020  This is a 60 y.o. female who presents with Chief Complaint  Patient presents with  . Follow-up    States she discontinued Losartan due to low BP readings at home.    History of present illness: Tiffany Reese She is following with psychiatry and therapy on a regular basis.  Dr. Lolly Mustache - monitoring for PTSD, bipolar, anxiety. She would like to see if he will take her elavil down at next visit because she feels it is contributing to weight gain. Last summer she was down to 265 and frustrated with weight. She started back on sugary drinks and not watching what she is eating, so she knows this doesn't help.   She has pain all the time. Hard to do anything. Has to stop multiple times with cooking/shopping. Knees, back, shoulder. Something all the time.   Muscle cramps are improved with her Has been having a lot of muscle cramps. Robaxin bid helps keep her mobile. Pain with shopping is a 10. At home is 7-8, but has to sit down to keep pain from going to a 10. Household chores will flare (like sweeping). Usually doing more sitting during her day because she has to take breaks with everything. Friend is taking ultram; she is wondering about this medication because her friend has done well on this. She notes pain worse at night if she doesn't take the voltaren; but she states that she doesn't feel like it brings down pain when she takes it.   She saw Dr. Aundria Rud in past for shoulder; has had injection. Dr. Aundria Rud also did back injections for her. Also had ankle rehabilitated.   Has been on disability (back dated) to 10/2014. Physical health and mental health.   Feels like fibromyalgia continues to be bad. Just hurting and tired all the time.    Does a protein drink in the morning to take morning medication. Wondering if she should repeat in the afternoon and not eat lunch; then just do dinner.   She had seen a pain mgmt doc in past who was DO;  but she was always worse after manipulation.   When she takes voltaren she takes in morning with slim fast. Takes second with food on stomach.    Allergies  Allergen Reactions  . Bupropion Other (See Comments)    Causes agitation  . Duloxetine Other (See Comments)    Causes involuntary movements  . Sulfa Antibiotics   . Metformin And Related     vomiting  . Paxil [Paroxetine Hcl]     Insomnia; manic  . Prozac [Fluoxetine Hcl]     Hyperactive, insomnia  . Trintellix [Vortioxetine]     Triggers fibromyalgia  . Zoloft [Sertraline Hcl]     Hyperactivity, insomnia   No outpatient medications have been marked as taking for the 10/31/20 encounter (Office Visit) with Wynn Banker, MD.    Review of Systems  Constitutional: Positive for fatigue. Negative for chills and fever.  Respiratory: Negative for cough, chest tightness, shortness of breath and wheezing.   Cardiovascular: Negative for chest pain, palpitations and leg swelling.  Musculoskeletal: Positive for arthralgias, back pain and gait problem (secondary to back and joint pain).  Psychiatric/Behavioral: Positive for sleep disturbance. The patient is nervous/anxious.     Objective:  BP 120/82 (BP Location: Left Arm, Patient Position: Sitting, Cuff Size: Large)   Pulse 90   Temp 97.7 F (36.5 C) (Oral)   Ht 5\' 6"  (1.676 m)  Wt 287 lb 8 oz (130.4 kg)   SpO2 95%   BMI 46.40 kg/m   Weight: 287 lb 8 oz (130.4 kg)   BP Readings from Last 3 Encounters:  10/31/20 120/82  02/27/20 120/80  05/18/19 (!) 154/98   Wt Readings from Last 3 Encounters:  10/31/20 287 lb 8 oz (130.4 kg)  09/18/20 290 lb (131.5 kg)  07/02/20 287 lb (130.2 kg)    Physical Exam Constitutional:      General: She is not in acute distress.    Appearance: She is well-developed.  Cardiovascular:     Rate and Rhythm: Normal rate and regular rhythm.     Heart sounds: Normal heart sounds. No murmur heard. No friction rub.  Pulmonary:      Effort: Pulmonary effort is normal. No respiratory distress.     Breath sounds: Normal breath sounds. No wheezing or rales.  Musculoskeletal:     Right lower leg: No edema.     Left lower leg: No edema.     Comments: She has tenderness with palpation of shoulders/paraspinal musculature.   Neurological:     Mental Status: She is alert and oriented to person, place, and time.  Psychiatric:        Attention and Perception: Attention normal.        Mood and Affect: Mood is anxious and depressed.        Behavior: Behavior normal.     Comments: Although depressed and anxious, she is much more engaged with me than she has been during previous visits    Flowsheet Row Office Visit from 10/31/2020 in [facility] [place]  PHQ-9 Total Score 9      Assessment/Plan 1. Primary hypertension Blood pressure has been well controlled off losartan. I have taken this off her list.  - CBC with Differential/Platelet; Future - Comprehensive metabolic panel; Future - Comprehensive metabolic panel - CBC with Differential/Platelet  2. Hypothyroidism, unspecified type Recheck bloodwork today. Synthroid daily current dose. - TSH; Future - TSH  3. Controlled type 2 diabetes mellitus with complication, without long-term current use of insulin (HCC) We discussed potential treatments, some that may even help with weight loss, but she already has significant med burden. She is going to work on cutting out lunch (was high cal/high fat frozen meal) and doing meal replacement; and sensible dinner. I think this will also help w sugars.  - Hemoglobin A1c; Future - Ambulatory referral to Ophthalmology - Hemoglobin A1c  4. Depression, recurrent (HCC) Following with psych. Mood has been stable; but not good per patient.  5. PTSD (post-traumatic stress disorder) See above.   6. DDD (degenerative disc disease), thoracolumbar She has not had success with interventions in past including PT,  osteopathic manipulation. We are going to try ultram to see if it helps with functioning. If she is not able to increase activity level with this medication or we are not seeing overall improvement secondary to mobility/pain relief with this, then we will not continue. Discussed new medication(s) today with patient. Discussed potential side effects and patient verbalized understanding.   7. Chronic pain syndrome See above - traMADol (ULTRAM) 50 MG tablet; Take 1 tablet (50 mg total) by mouth daily as needed for up to 5 days for severe pain.  Dispense: 15 tablet; Refill: 0  8. Gastroesophageal reflux disease, unspecified whether esophagitis present Not well controlled. Add in pepcid during day; prilosec in evening. Discussed benefit of weight loss. Limit foods that aggravate (soda) - omeprazole (  PRILOSEC) 40 MG capsule; Take 1 capsule (40 mg total) by mouth daily.  Dispense: 90 capsule; Refill: 1 - famotidine (PEPCID) 40 MG tablet; Take 1 tablet (40 mg total) by mouth daily.  Dispense: 90 tablet; Refill: 1  9. Hyperlipidemia associated with type 2 diabetes mellitus (HCC) - Lipid panel; Future - Lipid panel  10. Elevated uric acid in blood - Uric acid; Future - Uric acid  11. Elevated serum GGT level Didn't follow up as previously directed; will recheck today - Gamma GT; Future - Gamma GT     Return for pending labs. 50 minutes spent in chart review, time with patient; discussion of medications/working on simplifying med list. Discussion of healthier lifestyle choices, charting, exam.   Theodis Shove, MD

## 2020-11-01 ENCOUNTER — Other Ambulatory Visit: Payer: Self-pay | Admitting: Family Medicine

## 2020-11-01 DIAGNOSIS — R748 Abnormal levels of other serum enzymes: Secondary | ICD-10-CM

## 2020-11-01 LAB — COMPREHENSIVE METABOLIC PANEL
ALT: 167 U/L — ABNORMAL HIGH (ref 0–35)
AST: 199 U/L — ABNORMAL HIGH (ref 0–37)
Albumin: 4.7 g/dL (ref 3.5–5.2)
Alkaline Phosphatase: 156 U/L — ABNORMAL HIGH (ref 39–117)
BUN: 18 mg/dL (ref 6–23)
CO2: 26 mEq/L (ref 19–32)
Calcium: 10.3 mg/dL (ref 8.4–10.5)
Chloride: 99 mEq/L (ref 96–112)
Creatinine, Ser: 1.04 mg/dL (ref 0.40–1.20)
GFR: 59.71 mL/min — ABNORMAL LOW (ref >60.00)
Glucose, Bld: 171 mg/dL — ABNORMAL HIGH (ref 70–99)
Potassium: 4.1 mEq/L (ref 3.5–5.1)
Sodium: 139 mEq/L (ref 135–145)
Total Bilirubin: 0.4 mg/dL (ref 0.2–1.2)
Total Protein: 7.7 g/dL (ref 6.0–8.3)

## 2020-11-01 LAB — CBC WITH DIFFERENTIAL/PLATELET
Basophils Absolute: 0.1 10*3/uL (ref 0.0–0.1)
Basophils Relative: 1.5 % (ref 0.0–3.0)
Eosinophils Absolute: 0.1 10*3/uL (ref 0.0–0.7)
Eosinophils Relative: 1.6 % (ref 0.0–5.0)
HCT: 43 % (ref 36.0–46.0)
Hemoglobin: 14.3 g/dL (ref 12.0–15.0)
Lymphocytes Relative: 27.6 % (ref 12.0–46.0)
Lymphs Abs: 2.1 10*3/uL (ref 0.7–4.0)
MCHC: 33.3 g/dL (ref 30.0–36.0)
MCV: 93.9 fl (ref 78.0–100.0)
Monocytes Absolute: 0.4 10*3/uL (ref 0.1–1.0)
Monocytes Relative: 4.8 % (ref 3.0–12.0)
Neutro Abs: 5 10*3/uL (ref 1.4–7.7)
Neutrophils Relative %: 64.5 % (ref 43.0–77.0)
Platelets: 212 10*3/uL (ref 150.0–400.0)
RBC: 4.58 Mil/uL (ref 3.87–5.11)
RDW: 13.9 % (ref 11.5–15.5)
WBC: 7.8 10*3/uL (ref 4.0–10.5)

## 2020-11-01 LAB — LIPID PANEL
Cholesterol: 233 mg/dL — ABNORMAL HIGH (ref 0–200)
HDL: 50.5 mg/dL (ref >39.00)
LDL Cholesterol: 143 mg/dL — ABNORMAL HIGH (ref 0–99)
NonHDL: 182.5
Total CHOL/HDL Ratio: 5
Triglycerides: 197 mg/dL — ABNORMAL HIGH (ref 0.0–149.0)
VLDL: 39.4 mg/dL (ref 0.0–40.0)

## 2020-11-01 LAB — HEMOGLOBIN A1C: Hgb A1c MFr Bld: 7.6 % — ABNORMAL HIGH (ref 4.6–6.5)

## 2020-11-01 LAB — TSH: TSH: 2.71 u[IU]/mL (ref 0.35–4.50)

## 2020-11-01 LAB — URIC ACID: Uric Acid, Serum: 8.1 mg/dL — ABNORMAL HIGH (ref 2.4–7.0)

## 2020-11-01 LAB — GAMMA GT: GGT: 142 U/L — ABNORMAL HIGH (ref 7–51)

## 2020-11-02 ENCOUNTER — Encounter: Payer: Self-pay | Admitting: Physician Assistant

## 2020-11-07 ENCOUNTER — Ambulatory Visit (INDEPENDENT_AMBULATORY_CARE_PROVIDER_SITE_OTHER): Payer: Medicare HMO | Admitting: Psychiatry

## 2020-11-07 ENCOUNTER — Encounter (HOSPITAL_COMMUNITY): Payer: Self-pay | Admitting: Psychiatry

## 2020-11-07 ENCOUNTER — Other Ambulatory Visit: Payer: Self-pay

## 2020-11-07 DIAGNOSIS — F419 Anxiety disorder, unspecified: Secondary | ICD-10-CM

## 2020-11-07 NOTE — Progress Notes (Signed)
Virtual Visit via Video Note  I connected with Tiffany Reese on 11/07/20 at  3:30 PM EST by a video enabled telemedicine application and verified that I am speaking with the correct person using two identifiers.  Location: Patient: Patient Home Provider: Home Office  I discussed the limitations of evaluation and management by telemedicine and the availability of in person appointments. The patient expressed understanding and agreed to proceed.  History of Present Illness: Bipolar 1, Anxiety and PTSD  Treatment Plan Goals: 1)Tiffany Reese would like to implement healthy habits, such as decrease sugar intake, walk daily, meal prep and plan grocery list to incorporated healthier purchases, in daily life to improve health, loose weight, and to feel more confident and less guilty.  2)Tiffany Reese would like to improve relationship with her daughter and granddaughter, through communicating with them daily or every other day, cleaning and organizing home so they are more comfortable visiting her and through monthly face to face interactions else where.  Observations/Objective: Counselor met with Client for individual therapy via Webex. Counselor assessed MH symptoms and progress on treatment plan goals,client reportsthat she has been experience physical health challenges recently which are impacting mental health and ability to function as desired. Client presents withmoderatedepression and moderateanxiety. Client denied suicidal ideation or self-harm behaviors.  Goal 1:Counselor assessed daily functioning and implementation of strategies. Client reported on series of phsycial health issues, which she is addressing. Client reports making a decision to stop drinking sugary drinks, replacing them with diet sods and water. Counselor and Client worked to identify a IT trainer of new eating habits over the course of the coming month. Counselor provided resources for successful  programs and apps.   Goal 2:Counselor assessed progress in relationship building with daughter. Client noted that she has had limited communication and interactions with family due to feeling unwell. Client plans to reach out to them this week to reconnect.  Assessment and Plan: Counselor will continue to meet with patient to address treatment plan goals. Patient will continue to follow recommendations of providers and implement skills learned in session.  Follow Up Instructions: Counselor will send information for next session via Webex.   The patient was advised to call back or seek an in-person evaluation if the symptoms worsen or if the condition fails to improve as anticipated.  I provided 55 minutes of non-face-to-face time during this encounter.   [name], LCSW

## 2020-11-09 ENCOUNTER — Encounter: Payer: Self-pay | Admitting: Family Medicine

## 2020-11-09 ENCOUNTER — Other Ambulatory Visit: Payer: Self-pay

## 2020-11-09 ENCOUNTER — Telehealth (INDEPENDENT_AMBULATORY_CARE_PROVIDER_SITE_OTHER): Payer: Medicare HMO | Admitting: Family Medicine

## 2020-11-09 VITALS — BP 141/83 | HR 86

## 2020-11-09 DIAGNOSIS — E1165 Type 2 diabetes mellitus with hyperglycemia: Secondary | ICD-10-CM | POA: Diagnosis not present

## 2020-11-09 DIAGNOSIS — E1169 Type 2 diabetes mellitus with other specified complication: Secondary | ICD-10-CM

## 2020-11-09 DIAGNOSIS — E785 Hyperlipidemia, unspecified: Secondary | ICD-10-CM | POA: Diagnosis not present

## 2020-11-09 DIAGNOSIS — I10 Essential (primary) hypertension: Secondary | ICD-10-CM

## 2020-11-09 DIAGNOSIS — R748 Abnormal levels of other serum enzymes: Secondary | ICD-10-CM

## 2020-11-09 DIAGNOSIS — E039 Hypothyroidism, unspecified: Secondary | ICD-10-CM | POA: Diagnosis not present

## 2020-11-09 NOTE — Progress Notes (Signed)
Virtual Visit via Telephone Note  I connected with Tiffany Reese  on 11/09/20 at 11:00 AM EST by telephone and verified that I am speaking with the correct person using two identifiers.   I discussed the limitations, risks, security and privacy concerns of performing an evaluation and management service by telephone and the availability of in person appointments. I also discussed with the patient that there may be a patient responsible charge related to this service. The patient expressed understanding and agreed to proceed.  Location patient: home Location provider:  [facility]  [address] [place], [place] [address]  Participants present for the call: patient, provider Patient did not have a visit in the prior 7 days to address this/these issue(s).   History of Present Illness: Right side hurting - when she breathes it hurts. Right underneath rib cage; can push and there is some soreness from under ribs to above hips. After pushing, then feels more sore. Started bothering her yesterday. Just woke up and had pain. Breathing in pain is 4-5. If pushing on it can be up to 8. Didn't injure this; not sure what caused it to flare.   We are discussing bloodwork today. She previously was not truly diabetic. She had been put on Januvia in the past due to slightly sugars.    Observations/Objective: Patient sounds cheerful and well on the phone. I do not appreciate any SOB. Speech and thought processing are grossly intact. Patient reported vitals: Vitals with BMI 11/09/2020 10/31/2020 02/27/2020  Height - 5\' 6"  5\' 6"   Weight - 287 lbs 8 oz 277 lbs  BMI - 46.43 44.73  Systolic 141 120  Diastolic 83 82 80  Pulse 86 90 90  Some encounter information is confidential and restricted. Go to Review Flowsheets activity to see all data.     Assessment and Plan:  1. Controlled type 2 diabetes mellitus with hyperglycemia, without long-term current use of insulin (HCC) At this  time, she would really like to work on healthier eating and, as we discussed at her last visit, with more activity. We discussed goals for A1c for her. Rather than adding medication at this time, we are going to have her complete follow-up for elevated liver enzymes and will readdress sugar with a follow-up A1c in 3 months time.  2. Elevated liver enzymes Referral has been placed for GI. She had an ultrasound last year (02/2020) that demonstrated fatty liver, but was otherwise normal. She had a negative hepatitis panel last year as well. We discussed reasons to seek care more quickly (vomiting, abdominal pain).   3. Hypothyroidism, unspecified type Thyroid has been well controlled.  4. Primary hypertension We will continue to monitor. Currently well controlled. The only medication she currently takes is furosemide. We did discuss an ACE/ARB protection, but off on starting any new medications until she has completed her GI evaluation.  5. Hyperlipidemia associated with type 2 diabetes mellitus (HCC) We discussed cholesterol goals, especially now in light of full-blown diabetes. She will need to be on a statin for preventative standpoint, but with her current elevation   Follow Up Instructions: We will have her follow up in office in 3 mo time to recheck A1C and discuss treatment plan. In meanwhile will have her see GI for eval of elevated liver enzymes.    [id number] [id number] [id number] 11-20 [id number] 21-30 I did not refer this patient for an OV in the next 24 hours for this/these issue(s).  I discussed  the assessment and treatment plan with the patient. The patient was provided an opportunity to ask questions and all were answered. The patient agreed with the plan and demonstrated an understanding of the instructions.   The patient was advised to call back or seek an in-person evaluation if the symptoms worsen or if the condition fails to improve as anticipated.  I provided 30 minutes of non-face-to-face  time during this encounter.   Theodis Shove, MD

## 2020-11-14 ENCOUNTER — Other Ambulatory Visit: Payer: Self-pay

## 2020-11-14 ENCOUNTER — Telehealth (INDEPENDENT_AMBULATORY_CARE_PROVIDER_SITE_OTHER): Payer: Medicare HMO | Admitting: Psychiatry

## 2020-11-14 DIAGNOSIS — F419 Anxiety disorder, unspecified: Secondary | ICD-10-CM

## 2020-11-14 DIAGNOSIS — F4310 Post-traumatic stress disorder, unspecified: Secondary | ICD-10-CM | POA: Diagnosis not present

## 2020-11-14 DIAGNOSIS — F319 Bipolar disorder, unspecified: Secondary | ICD-10-CM | POA: Diagnosis not present

## 2020-11-14 MED ORDER — DIAZEPAM 2 MG PO TABS: ORAL_TABLET | ORAL | 1 refills | Status: DC

## 2020-11-14 MED ORDER — AMITRIPTYLINE HCL 100 MG PO TABS: 100.0000 mg | ORAL_TABLET | Freq: Every day | ORAL | 1 refills | Status: DC

## 2020-11-14 MED ORDER — LURASIDONE HCL 60 MG PO TABS: 60.0000 mg | ORAL_TABLET | Freq: Every day | ORAL | 1 refills | Status: DC
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
## 2020-11-16 ENCOUNTER — Telehealth: Payer: Self-pay | Admitting: *Deleted

## 2020-11-16 NOTE — Telephone Encounter (Signed)
-----   Message from Wynn Banker, MD sent at 11/15/2020 10:59 PM EST ----- Please set up 3 mo follow up in office to recheck sugars.

## 2020-11-16 NOTE — Telephone Encounter (Signed)
Patient informed of the message below.

## 2020-11-16 NOTE — Telephone Encounter (Signed)
Appt scheduled for 02/16/2019 at 3:30pm.  Patient requested a message be sent to PCP as she stated Tramadol is not helping with her back pain and she requested a stronger medication.  Message sent to PCP.

## 2020-11-16 NOTE — Telephone Encounter (Signed)
If she still has some left; have her take 2 tramadol at a time (100mg ) and see if this helps with pain. Let me know. If that doesn't help, then I feel we are back to back specialist/pain management for her as I do not feel comfortable doing stronger pain medications (we also have to use caution with liver)

## 2020-11-21 ENCOUNTER — Ambulatory Visit (INDEPENDENT_AMBULATORY_CARE_PROVIDER_SITE_OTHER): Payer: Medicare HMO | Admitting: Psychiatry

## 2020-11-21 ENCOUNTER — Other Ambulatory Visit: Payer: Self-pay

## 2020-11-21 DIAGNOSIS — F4310 Post-traumatic stress disorder, unspecified: Secondary | ICD-10-CM

## 2020-11-21 DIAGNOSIS — F419 Anxiety disorder, unspecified: Secondary | ICD-10-CM | POA: Diagnosis not present

## 2020-11-28 ENCOUNTER — Ambulatory Visit: Payer: Medicare HMO | Admitting: Physician Assistant

## 2020-11-29 ENCOUNTER — Other Ambulatory Visit: Payer: Self-pay | Admitting: Family Medicine

## 2020-11-30 ENCOUNTER — Other Ambulatory Visit: Payer: Self-pay | Admitting: Family Medicine

## 2020-11-30 DIAGNOSIS — G894 Chronic pain syndrome: Secondary | ICD-10-CM

## 2020-11-30 NOTE — Telephone Encounter (Signed)
Per my last rx note; I wanted to see if she was getting relief with this? I had advised trying to take 100mg  to see if this gave back some mobility; helped with back pain? And if no improvement with that amount, I suggested we reconsider pain mgmt. I don't see where I heard back on status with this? Is med helping?   Keep follow up in may.   Please update me on benefits of med before I refill.

## 2020-12-02 ENCOUNTER — Other Ambulatory Visit: Payer: Self-pay | Admitting: Family Medicine

## 2020-12-02 DIAGNOSIS — M25572 Pain in left ankle and joints of left foot: Secondary | ICD-10-CM

## 2020-12-05 ENCOUNTER — Encounter (HOSPITAL_COMMUNITY): Payer: Self-pay | Admitting: Psychiatry

## 2020-12-05 ENCOUNTER — Ambulatory Visit (INDEPENDENT_AMBULATORY_CARE_PROVIDER_SITE_OTHER): Payer: Medicare HMO | Admitting: Psychiatry

## 2020-12-05 ENCOUNTER — Other Ambulatory Visit: Payer: Self-pay

## 2020-12-05 DIAGNOSIS — F4310 Post-traumatic stress disorder, unspecified: Secondary | ICD-10-CM

## 2020-12-05 DIAGNOSIS — F419 Anxiety disorder, unspecified: Secondary | ICD-10-CM | POA: Diagnosis not present

## 2020-12-05 NOTE — Progress Notes (Addendum)
Virtual Visit via Video Note  I connected with Tiffany Reese on 11/21/20 at  3:30 PM EST by a video enabled telemedicine application and verified that I am speaking with the correct person using two identifiers.  Location: Patient: Patient Home Provider: Home Office  I discussed the limitations of evaluation and management by telemedicine and the availability of in person appointments. The patient expressed understanding and agreed to proceed.  History of Present Illness: Bipolar 1, Anxiety and PTSD  Treatment Plan Goals: 1)Tiffany Reese would like to implement healthy habits, such as decrease sugar intake, walk daily, meal prep and plan grocery list to incorporated healthier purchases, in daily life to improve health, loose weight, and to feel more confident and less guilty.  2)Tiffany Reese would like to improve relationship with her daughter and granddaughter, through communicating with them daily or every other day, cleaning and organizing home so they are more comfortable visiting her and through monthly face to face interactions else where.  Observations/Objective: Counselor met with Client for individual therapy via Webex. Counselor assessed MH symptoms and progress on treatment plan goals,client reportsthat she "I'm not in a good place mentally."Client presents withmoderatedepression and moderateanxiety. Client denied suicidal ideation or self-harm behaviors.  Goal 1:Counselor assessed daily functioning and implementation of strategies. Client noted that her functioning has been minimal, dwelling on past, instead of focusing on what she can control in the here and now. Counselor applied solution-focused strategies and interventions to plan for action over the next two weeks to increase frequency of hygiene practices and exercise/movement.  Goal 2:Counselor assessed progress in relationship building with daughter.Client reports changes in the status of her ex-husbands living  plans, as he may return to the area in the next year. Client processed feelings of anger, trauma, regret, hurt, dismissal, etc in relation to how he has been impacted by their divorce vs how the divorce impacted her. Client reports being ashamed of decisions she's made and outcomes. Counselor processed grief and loss, current thoughts and feelings. Counselor reviewed cognitive coping skills and discussed behavior management strategies to alleviate reinforcing depressive behaviors. Client to follow up with daughter and to share desire to spend more time together to rebuild relationship.  Assessment and Plan: Counselor will continue to meet with patient to address treatment plan goals. Patient will continue to follow recommendations of providers and implement skills learned in session.  Follow Up Instructions: Counselor will send information for next session via Webex.   The patient was advised to call back or seek an in-person evaluation if the symptoms worsen or if the condition fails to improve as anticipated.  I provided 53 minutes of non-face-to-face time during this encounter.   [name], LCSW

## 2020-12-05 NOTE — Progress Notes (Signed)
Virtual Visit via Video Note  I connected with Tiffany Reese on 12/05/20 at  3:30 PM EDT by a video enabled telemedicine application and verified that I am speaking with the correct person using two identifiers.  Location: Patient: Patient Home Provider: Home Office  I discussed the limitations of evaluation and management by telemedicine and the availability of in person appointments. The patient expressed understanding and agreed to proceed.  History of Present Illness: Bipolar 1, Anxiety and PTSD  Treatment Plan Goals: 1)Tiffany Reese would like to implement healthy habits, such as decrease sugar intake, walk daily, meal prep and plan grocery list to incorporated healthier purchases, in daily life to improve health, loose weight, and to feel more confident and less guilty.  2)Tiffany Reese would like to improve relationship with her daughter and granddaughter, through communicating with them daily or every other day, cleaning and organizing home so they are more comfortable visiting her and through monthly face to face interactions else where.  Observations/Objective: Counselor met with Client for individual therapy via Webex. Counselor assessed MH symptoms and progress on treatment plan goals,client  presented as disoriented, being unaware of the time change or other current events, stating "I've been in my cocoon."  Client presents withmoderatedepression and moderateanxiety. Client denied suicidal ideation or self-harm behaviors.  Goal 1:Counselor assessed daily functioning and implementation of strategies.Client noted that she has lost 9 lbs since our last session, changing diet and counting carbs. Counselor celebrated her progress. Client noted that she is resistant to exercise and leaving the home at this time, due to sinus infection. Client reports regression in ADLs, including infrequent bathing and personal care. Counselor challenged Client to update clocks in home and to sort  through/check/address mail, which she shared is in multiple high piles in her dining room. Client plans to go on an outing with a friend tomorrow and is looking forward to their time together.   Goal 2:Counselor assessed progress in relationship building with daughter.Client noted that she has talked with daughter more frequently since last session. Client smiled and laughed as she shared about their conversations and interactions with her granddaughter. Client presented as more content with status of relationship, stating that she continues to contact on a more regular basis. Client shared about financial concerns with daughter to problem solve for future changes. Counselor praised Client for being proactive and used solution focused strategies to address the problem, including sharing local resources.   Assessment and Plan: Counselor will continue to meet with patient to address treatment plan goals. Patient will continue to follow recommendations of providers and implement skills learned in session.  Follow Up Instructions: Counselor will send information for next session via Webex.   The patient was advised to call back or seek an in-person evaluation if the symptoms worsen or if the condition fails to improve as anticipated.  I provided 59 minutes of non-face-to-face time during this encounter.    , LCSW 

## 2020-12-07 ENCOUNTER — Other Ambulatory Visit (HOSPITAL_COMMUNITY): Payer: Self-pay | Admitting: Psychiatry

## 2020-12-07 DIAGNOSIS — F419 Anxiety disorder, unspecified: Secondary | ICD-10-CM

## 2020-12-07 DIAGNOSIS — F4310 Post-traumatic stress disorder, unspecified: Secondary | ICD-10-CM

## 2020-12-13 ENCOUNTER — Other Ambulatory Visit: Payer: Self-pay | Admitting: Family Medicine

## 2020-12-13 DIAGNOSIS — M797 Fibromyalgia: Secondary | ICD-10-CM

## 2020-12-17 ENCOUNTER — Ambulatory Visit (INDEPENDENT_AMBULATORY_CARE_PROVIDER_SITE_OTHER): Payer: Medicare HMO | Admitting: Physician Assistant

## 2020-12-17 ENCOUNTER — Other Ambulatory Visit (INDEPENDENT_AMBULATORY_CARE_PROVIDER_SITE_OTHER): Payer: Medicare HMO

## 2020-12-17 ENCOUNTER — Encounter: Payer: Self-pay | Admitting: Physician Assistant

## 2020-12-17 VITALS — BP 140/80 | HR 90 | Ht 66.5 in | Wt 285.0 lb

## 2020-12-17 DIAGNOSIS — Z800 Family history of malignant neoplasm of digestive organs: Secondary | ICD-10-CM

## 2020-12-17 DIAGNOSIS — Z86010 Personal history of colonic polyps: Secondary | ICD-10-CM

## 2020-12-17 DIAGNOSIS — K760 Fatty (change of) liver, not elsewhere classified: Secondary | ICD-10-CM

## 2020-12-17 DIAGNOSIS — R748 Abnormal levels of other serum enzymes: Secondary | ICD-10-CM

## 2020-12-17 LAB — SEDIMENTATION RATE: Sed Rate: 65 mm/hr — ABNORMAL HIGH (ref 0–30)

## 2020-12-17 MED ORDER — PLENVU 140 G PO SOLR: 1.0000 | ORAL | 0 refills | Status: DC

## 2020-12-17 NOTE — Patient Instructions (Signed)
If you are age 58 or older, your body mass index should be between 23-30. Your Body mass index is 45.31 kg/m. If this is out of the aforementioned range listed, please consider follow up with your Primary Care Provider.  If you are age 77 or younger, your body mass index should be between 19-25. Your Body mass index is 45.31 kg/m. If this is out of the aformentioned range listed, please consider follow up with your Primary Care Provider.   Your provider has requested that you go to the basement level for lab work before leaving today. Press "B" on the elevator. The lab is located at the first door on the left as you exit the elevator.  You have been scheduled for a colonoscopy. Please follow written instructions given to you at your visit today.  Please pick up your prep supplies at the pharmacy within the next 1-3 days. If you use inhalers (even only as needed), please bring them with you on the day of your procedure.  Follow up pending at this time.  Thank you for entrusting me with your care and choosing [facility].  Amy Esterwood, PA-C

## 2020-12-18 ENCOUNTER — Other Ambulatory Visit: Payer: Self-pay | Admitting: Physician Assistant

## 2020-12-18 ENCOUNTER — Encounter: Payer: Self-pay | Admitting: Physician Assistant

## 2020-12-18 LAB — HEPATIC FUNCTION PANEL
ALT: 112 U/L — ABNORMAL HIGH (ref 0–35)
AST: 90 U/L — ABNORMAL HIGH (ref 0–37)
Albumin: 4.8 g/dL (ref 3.5–5.2)
Alkaline Phosphatase: 149 U/L — ABNORMAL HIGH (ref 39–117)
Bilirubin, Direct: 0.1 mg/dL (ref 0.0–0.3)
Total Bilirubin: 0.3 mg/dL (ref 0.2–1.2)
Total Protein: 8.3 g/dL (ref 6.0–8.3)

## 2020-12-18 LAB — FERRITIN: Ferritin: 355 ng/mL — ABNORMAL HIGH (ref 10.0–291.0)

## 2020-12-18 LAB — C-REACTIVE PROTEIN: CRP: 2.8 mg/dL (ref 0.5–20.0)
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
## 2020-12-19 ENCOUNTER — Ambulatory Visit (INDEPENDENT_AMBULATORY_CARE_PROVIDER_SITE_OTHER): Payer: Medicare HMO | Admitting: Psychiatry

## 2020-12-19 ENCOUNTER — Other Ambulatory Visit: Payer: Self-pay

## 2020-12-19 DIAGNOSIS — F419 Anxiety disorder, unspecified: Secondary | ICD-10-CM | POA: Diagnosis not present

## 2020-12-19 DIAGNOSIS — F4310 Post-traumatic stress disorder, unspecified: Secondary | ICD-10-CM | POA: Diagnosis not present

## 2020-12-19 LAB — CERULOPLASMIN: Ceruloplasmin: 39 mg/dL (ref 18–53)

## 2020-12-19 LAB — MITOCHONDRIAL ANTIBODIES: Mitochondrial M2 Ab, IgG: <=20 U

## 2020-12-19 LAB — HEPATITIS A ANTIBODY, TOTAL: Hepatitis A AB,Total: NONREACTIVE

## 2020-12-19 LAB — HEPATITIS C ANTIBODY
Hepatitis C Ab: NONREACTIVE
SIGNAL TO CUT-OFF: 0.04 (ref <1.00)

## 2020-12-19 LAB — ANA: Anti Nuclear Antibody (ANA): NEGATIVE

## 2020-12-19 LAB — HEPATITIS B SURFACE ANTIBODY,QUALITATIVE: Hep B S Ab: NONREACTIVE

## 2020-12-19 LAB — ALPHA-1-ANTITRYPSIN: A-1 Antitrypsin, Ser: 186 mg/dL (ref 83–199)

## 2020-12-19 LAB — HEPATITIS B SURFACE ANTIGEN: Hepatitis B Surface Ag: NONREACTIVE

## 2020-12-19 LAB — ANTI-SMOOTH MUSCLE ANTIBODY, IGG: Actin (Smooth Muscle) Antibody (IGG): <20 U (ref <20)

## 2020-12-19 NOTE — Telephone Encounter (Signed)
Patient called back and was informed of the message below.  Patient stated Tramadol helped a little but not on the severe pain. Patient also wanted to let Dr Hassan Rowan know she was seen by GI and has been scheduled for a colonoscopy.  Message sent to PCP.

## 2020-12-20 ENCOUNTER — Encounter (HOSPITAL_COMMUNITY): Payer: Self-pay | Admitting: Psychiatry

## 2020-12-20 NOTE — Telephone Encounter (Signed)
If not significant help, then not worth taking. If she wants to see pain mgmt we can refer her. 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 I know she has been through a lot with trying to manage pain; so if she doesn't want to act on this now; we can just discuss again at may visit. But I don't want her taking a controlled substance if it is not helping.

## 2020-12-20 NOTE — Telephone Encounter (Signed)
Patient informed of the message below and stated she will discuss this at her next visit in May.  Message sent to PCP.

## 2020-12-20 NOTE — Progress Notes (Signed)
Virtual Visit via Video Note  I connected with Tiffany Reese on 12/20/20 at  3:30 PM EDT by a video enabled telemedicine application and verified that I am speaking with the correct person using two identifiers.  Location: Patient: Patient Home Provider: Home Office  I discussed the limitations of evaluation and management by telemedicine and the availability of in person appointments. The patient expressed understanding and agreed to proceed.  History of Present Illness: Bipolar 1, Anxiety and PTSD  Treatment Plan Goals: 1)Tiffany Reese would like to implement healthy habits, such as decrease sugar intake, walk daily, meal prep and plan grocery list to incorporated healthier purchases, in daily life to improve health, loose weight, and to feel more confident and less guilty.  2)Tiffany Reese would like to improve relationship with her daughter and granddaughter, through communicating with them daily or every other day, cleaning and organizing home so they are more comfortable visiting her and through monthly face to face interactions else where.  Observations/Objective: Counselor met with Client for individual therapy via Webex. Counselor assessed MH symptoms and progress on treatment plan goals,client  presented as disoriented, being unaware of the time change or other current events, stating "I've been in my cocoon."  Client presents withmoderatedepression and moderateanxiety. Client denied suicidal ideation or self-harm behaviors.  Goal 1:Counselor assessed daily functioning and implementation of strategies.Client noted that she has lost 9 lbs since our last session, changing diet and counting carbs. Counselor celebrated her progress. Client noted that she is is showering 3 times per week and getting out of the house for shopping, dr appointments and to spend time with best friend. Client described some upcoming events they have planned and how she will prepare for those. Client  maintained weight this week and is working on being consistent with diet plan.   Goal 2:Counselor assessed progress in relationship building with daughter.Client noted that she has talked with daughter more frequently since last session. Client stated that she and daughter have communicated regularly and wrote down a few items to process with her the next time they speak. Client is considering having a neighbor move in temporarily and would like daughters feedback. Client feels confident in holding this conversation.   Assessment and Plan: Counselor will continue to meet with patient to address treatment plan goals. Patient will continue to follow recommendations of providers and implement skills learned in session.  Follow Up Instructions: Counselor will send information for next session via Webex.   The patient was advised to call back or seek an in-person evaluation if the symptoms worsen or if the condition fails to improve as anticipated.  I provided 32mnutes of non-face-to-face time during this encounter.   BLise Auer LCSW

## 2020-12-21 ENCOUNTER — Other Ambulatory Visit: Payer: Self-pay

## 2020-12-21 DIAGNOSIS — R748 Abnormal levels of other serum enzymes: Secondary | ICD-10-CM

## 2020-12-21 NOTE — Telephone Encounter (Signed)
noted 

## 2020-12-26 ENCOUNTER — Telehealth: Payer: Self-pay | Admitting: Family Medicine

## 2020-12-26 NOTE — Telephone Encounter (Signed)
Left message for patient to call back and schedule Medicare Annual Wellness Visit (AWV) either virtually or in office. No detailed message left    AWV-I per [place] 04/22/18  please schedule at anytime with [facility] Nurse Health Advisor 1 or 2   This should be a 45 minute visit. 

## 2021-01-02 ENCOUNTER — Other Ambulatory Visit: Payer: Self-pay

## 2021-01-02 ENCOUNTER — Ambulatory Visit (INDEPENDENT_AMBULATORY_CARE_PROVIDER_SITE_OTHER): Payer: Medicare HMO | Admitting: Psychiatry

## 2021-01-02 DIAGNOSIS — F419 Anxiety disorder, unspecified: Secondary | ICD-10-CM

## 2021-01-02 NOTE — Progress Notes (Signed)
Virtual Visit via Video Note  I connected with [name] on 01/02/21 at  3:30 PM EDT by a video enabled telemedicine application and verified that I am speaking with the correct person using two identifiers.  Location: Patient: Patient Home Provider: Home Office  I discussed the limitations of evaluation and management by telemedicine and the availability of in person appointments. The patient expressed understanding and agreed to proceed.  History of Present Illness: Bipolar 1, Anxiety and PTSD  Treatment Plan Goals: 1)Cherysh would like to implement healthy habits, such as decrease sugar intake, walk daily, meal prep and plan grocery list to incorporated healthier purchases, in daily life to improve health, loose weight, and to feel more confident and less guilty.  2)Clay would like to improve relationship with her daughter and granddaughter, through communicating with them daily or every other day, cleaning and organizing home so they are more comfortable visiting her and through monthly face to face interactions else where.  Observations/Objective: Counselor met with Client for individual therapy via Webex. Counselor assessed MH symptoms and progress on treatment plan goals,client reporting that she has been more engaged and active over the past week. Client currently tired due to lack of sleep to assist dog who is not feeling well. Client presents withmoderatedepression and moderateanxiety. Client denied suicidal ideation or self-harm behaviors.  Goal 1:Counselor assessed daily functioning and implementation of strategies.Client states that she has not made healthy consumption habits this week and has not engaged in exercise, more than walking her pet a few times a day. Client has a plan to reset and reengage in health plan. Client stated she is showering more frequently and is working on self-care practices as daily practices. Counselor provided feedback and  additional strategies on skills and application.   Goal 2:Counselor assessed progress in relationship building with daughter.Client noted that she has talked with daughter more frequently since last session. Client stated that she and daughter communicated about potential roommate and are keeping it an ongoing discussion. Client receptive to her feedback. Client expressed intention to spend time with granddaughter, with daughter allowing time to be together for Easter holiday. Client reports current satisfaction with relationship at this time.   Assessment and Plan: Counselor will continue to meet with patient to address treatment plan goals. Patient will continue to follow recommendations of providers and implement skills learned in session.  Follow Up Instructions: Counselor will send information for next session via Webex.   The patient was advised to call back or seek an in-person evaluation if the symptoms worsen or if the condition fails to improve as anticipated.  I provided 42mnutes of non-face-to-face time during this encounter.   BLise Auer LCSW

## 2021-01-03 ENCOUNTER — Ambulatory Visit (HOSPITAL_COMMUNITY): Payer: Medicare HMO | Admitting: Psychiatry

## 2021-01-07 ENCOUNTER — Encounter (HOSPITAL_COMMUNITY): Payer: Self-pay | Admitting: Psychiatry

## 2021-01-09 ENCOUNTER — Encounter (HOSPITAL_COMMUNITY): Payer: Self-pay | Admitting: Psychiatry

## 2021-01-09 ENCOUNTER — Telehealth (INDEPENDENT_AMBULATORY_CARE_PROVIDER_SITE_OTHER): Payer: Medicare HMO | Admitting: Psychiatry

## 2021-01-09 ENCOUNTER — Other Ambulatory Visit: Payer: Self-pay

## 2021-01-09 ENCOUNTER — Other Ambulatory Visit (HOSPITAL_COMMUNITY): Payer: Self-pay | Admitting: Psychiatry

## 2021-01-09 DIAGNOSIS — F419 Anxiety disorder, unspecified: Secondary | ICD-10-CM

## 2021-01-09 DIAGNOSIS — F319 Bipolar disorder, unspecified: Secondary | ICD-10-CM

## 2021-01-09 DIAGNOSIS — F4310 Post-traumatic stress disorder, unspecified: Secondary | ICD-10-CM
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
## 2021-01-10 ENCOUNTER — Telehealth (HOSPITAL_COMMUNITY): Payer: Medicare HMO | Admitting: Psychiatry

## 2021-01-13 ENCOUNTER — Other Ambulatory Visit (HOSPITAL_COMMUNITY): Payer: Self-pay | Admitting: Psychiatry

## 2021-01-13 DIAGNOSIS — F4310 Post-traumatic stress disorder, unspecified: Secondary | ICD-10-CM

## 2021-01-13 DIAGNOSIS — F419 Anxiety disorder, unspecified: Secondary | ICD-10-CM

## 2021-01-14 ENCOUNTER — Telehealth (HOSPITAL_COMMUNITY): Payer: Self-pay | Admitting: *Deleted

## 2021-01-14 ENCOUNTER — Other Ambulatory Visit (HOSPITAL_COMMUNITY): Payer: Self-pay | Admitting: Psychiatry

## 2021-01-14 DIAGNOSIS — F419 Anxiety disorder, unspecified: Secondary | ICD-10-CM

## 2021-01-14 DIAGNOSIS — F4310 Post-traumatic stress disorder, unspecified: Secondary | ICD-10-CM

## 2021-01-14 DIAGNOSIS — F319 Bipolar disorder, unspecified: Secondary | ICD-10-CM

## 2021-01-14 MED ORDER — LURASIDONE HCL 60 MG PO TABS: 60.0000 mg | ORAL_TABLET | Freq: Every day | ORAL | 1 refills | Status: DC

## 2021-01-14 MED ORDER — DIAZEPAM 2 MG PO TABS: ORAL_TABLET | ORAL | 1 refills | Status: DC

## 2021-01-14 MED ORDER — AMITRIPTYLINE HCL 75 MG PO TABS: 75.0000 mg | ORAL_TABLET | Freq: Every day | ORAL | 0 refills | Status: DC

## 2021-01-14 NOTE — Telephone Encounter (Signed)
Send to [facility] [address]

## 2021-01-14 NOTE — Telephone Encounter (Signed)
Done at [facility] [address]

## 2021-01-14 NOTE — Telephone Encounter (Signed)
Pt called also called requesting refills on Valium and Latuda refills. Pt has an appointment upcoming on 03/11/21. Ok to fill?

## 2021-01-14 NOTE — Telephone Encounter (Signed)
Patient needs refill of Elavil.  Next appt is 03/11/21.

## 2021-01-14 NOTE — Progress Notes (Signed)
Reviewed and agree with documentation and assessment and plan. K. Veena Floy Riegler , MD   

## 2021-01-16 ENCOUNTER — Other Ambulatory Visit: Payer: Self-pay

## 2021-01-16 ENCOUNTER — Ambulatory Visit (INDEPENDENT_AMBULATORY_CARE_PROVIDER_SITE_OTHER): Payer: Medicare HMO | Admitting: Psychiatry

## 2021-01-16 DIAGNOSIS — F419 Anxiety disorder, unspecified: Secondary | ICD-10-CM | POA: Diagnosis not present

## 2021-01-16 NOTE — Progress Notes (Signed)
Virtual Visit via Video Note  I connected with Tiffany Reese on 01/16/21 at  3:30 PM EDT by a video enabled telemedicine application and verified that I am speaking with the correct person using two identifiers.  Location: Patient: Patient Home Provider: Home Office  I discussed the limitations of evaluation and management by telemedicine and the availability of in person appointments. The patient expressed understanding and agreed to proceed.  History of Present Illness: Bipolar 1, Anxiety and PTSD  Treatment Plan Goals: 1)Tiffany Reese would like to implement healthy habits, such as decrease sugar intake, walk daily, meal prep and plan grocery list to incorporated healthier purchases, in daily life to improve health, loose weight, and to feel more confident and less guilty.  2)Tiffany Reese would like to improve relationship with her daughter and granddaughter, through communicating with them daily or every other day, cleaning and organizing home so they are more comfortable visiting her and through monthly face to face interactions else where.  Observations/Objective: Counselor met with Client for individual therapy via Webex. Counselor assessed MH symptoms and progress on treatment plan goals,client stating that she is currently concerned with her beloved pets declining health, which is troubling and emotionally distressing to her. Client presents withmoderatedepression and moderateanxiety. Client denied suicidal ideation or self-harm behaviors.  Goal 1:Counselor assessed daily functioning and implementation of strategies.Client reports no weight loss or adherence to a lifestyle plan to address weight and other health issues. Client assigned blame on increased concern with pets health, causing her to make unhealthy choices and lack of routine sleep. Counselor used CBT interventions to address cognitive distortions and behaviors. Client engaged well in awareness.  Goal 2:Counselor  assessed progress in relationship building with daughter.Client noted that she has talked with daughter more frequently since last session.Client shared updates on interactions with daughter. Client reports feeling loved by daughter for having a photo shoot for her sick and agin pet. Counselor discussed interactions and encouraged appreciation practices.  Client reports current satisfaction with relationship at this time.   Assessment and Plan: Counselor will continue to meet with patient to address treatment plan goals. Patient will continue to follow recommendations of providers and implement skills learned in session.  Follow Up Instructions: Counselor will send information for next session via Webex.   The patient was advised to call back or seek an in-person evaluation if the symptoms worsen or if the condition fails to improve as anticipated.  I provided 15mnutes of non-face-to-face time during this encounter.   BLise Auer LCSW

## 2021-01-17 ENCOUNTER — Ambulatory Visit (HOSPITAL_COMMUNITY): Payer: Medicare HMO | Admitting: Psychiatry

## 2021-01-17 ENCOUNTER — Encounter (HOSPITAL_COMMUNITY): Payer: Self-pay | Admitting: Psychiatry

## 2021-01-19 ENCOUNTER — Other Ambulatory Visit: Payer: Self-pay | Admitting: Family Medicine

## 2021-01-30 ENCOUNTER — Ambulatory Visit (INDEPENDENT_AMBULATORY_CARE_PROVIDER_SITE_OTHER): Payer: Medicare HMO | Admitting: Psychiatry

## 2021-01-30 ENCOUNTER — Other Ambulatory Visit: Payer: Self-pay

## 2021-01-30 DIAGNOSIS — F419 Anxiety disorder, unspecified: Secondary | ICD-10-CM

## 2021-02-01 ENCOUNTER — Other Ambulatory Visit (INDEPENDENT_AMBULATORY_CARE_PROVIDER_SITE_OTHER): Payer: Medicare HMO

## 2021-02-01 ENCOUNTER — Telehealth: Payer: Self-pay | Admitting: Physician Assistant

## 2021-02-01 DIAGNOSIS — R748 Abnormal levels of other serum enzymes: Secondary | ICD-10-CM

## 2021-02-01 LAB — HEPATIC FUNCTION PANEL
ALT: 125 U/L — ABNORMAL HIGH (ref 0–35)
AST: 94 U/L — ABNORMAL HIGH (ref 0–37)
Albumin: 4.7 g/dL (ref 3.5–5.2)
Alkaline Phosphatase: 179 U/L — ABNORMAL HIGH (ref 39–117)
Bilirubin, Direct: 0.1 mg/dL (ref 0.0–0.3)
Total Bilirubin: 0.6 mg/dL (ref 0.2–1.2)
Total Protein: 8.3 g/dL (ref 6.0–8.3)

## 2021-02-01 NOTE — Telephone Encounter (Signed)
Patient called to request an alternative prep medication said the Plenvu is too expensive.

## 2021-02-04 NOTE — Telephone Encounter (Signed)
Called patient's pharmacy and they were able to run her prep against her Medicaid. I have contact the patient and let her know. She has also been advised that they are going to order the prep and that it should be in tomorrow afternoon. The patient expressed understanding.

## 2021-02-05 ENCOUNTER — Other Ambulatory Visit (HOSPITAL_COMMUNITY): Payer: Self-pay | Admitting: Psychiatry

## 2021-02-05 ENCOUNTER — Other Ambulatory Visit: Payer: Self-pay | Admitting: Physician Assistant

## 2021-02-05 DIAGNOSIS — F4310 Post-traumatic stress disorder, unspecified: Secondary | ICD-10-CM

## 2021-02-05 DIAGNOSIS — F419 Anxiety disorder, unspecified: Secondary | ICD-10-CM

## 2021-02-08 ENCOUNTER — Other Ambulatory Visit: Payer: Self-pay | Admitting: Family Medicine

## 2021-02-08 ENCOUNTER — Encounter (HOSPITAL_COMMUNITY): Payer: Self-pay | Admitting: Psychiatry

## 2021-02-08 DIAGNOSIS — M25572 Pain in left ankle and joints of left foot: Secondary | ICD-10-CM

## 2021-02-08 NOTE — Progress Notes (Signed)
Virtual Visit via Video Note  I connected with Tiffany Reese on 02/08/21 at  3:30 PM EDT by a video enabled telemedicine application and verified that I am speaking with the correct person using two identifiers.  Location: Patient: Home Provider: Office   I discussed the limitations of evaluation and management by telemedicine and the availability of in person appointments. The patient expressed understanding and agreed to proceed.  History of Present Illness:    Observations/Objective:   Assessment and Plan:   Follow Up Instructions:    I discussed the assessment and treatment plan with the patient. The patient was provided an opportunity to ask questions and all were answered. The patient agreed with the plan and demonstrated an understanding of the instructions.   The patient was advised to call back or seek an in-person evaluation if the symptoms worsen or if the condition fails to improve as anticipated.  I provided 55 minutes of non-face-to-face time during this encounter.   Hilbert Odor, LCSW

## 2021-02-11 ENCOUNTER — Other Ambulatory Visit: Payer: Self-pay | Admitting: Family Medicine

## 2021-02-11 DIAGNOSIS — M797 Fibromyalgia: Secondary | ICD-10-CM

## 2021-02-12 ENCOUNTER — Other Ambulatory Visit (HOSPITAL_COMMUNITY): Payer: Self-pay | Admitting: *Deleted

## 2021-02-12 ENCOUNTER — Telehealth: Payer: Self-pay | Admitting: Pharmacist

## 2021-02-12 DIAGNOSIS — F419 Anxiety disorder, unspecified: Secondary | ICD-10-CM

## 2021-02-12 DIAGNOSIS — F4310 Post-traumatic stress disorder, unspecified: Secondary | ICD-10-CM

## 2021-02-12 MED ORDER — AMITRIPTYLINE HCL 50 MG PO TABS: 50.0000 mg | ORAL_TABLET | Freq: Every day | ORAL | 0 refills | Status: DC

## 2021-02-12 NOTE — Chronic Care Management (AMB) (Signed)
  Chronic Care Management Pharmacy Assistant   Name: Tiffany Reese  MRN: [id number] DOB: 07/16/1961  Reason for Encounter: General Adherence Call       Recent office visits:  . 02.18.2022 Koberlein, Junell C, MD video visit follow up on lab results and type 2 diabetes . 02.09.2022 Koberlein, Junell C, MD - present for follow-upon hypertension o Medication Changes o Famotidine 40 mg Oral Daily o Tramadol HCl 50 mg Oral Daily PRN o Omeprazole 40 mg Oral Daily  o Losartan 50 mg- patient reported not taking Recent consult visits . 05.11.2022 Morris, [name], LCSW Psychiatry-patient present for follow up visit for anxiety . 04.27.2022 Morris, [name], LCSW Psychiatry-patient present for follow up visit for anxiety . 04.20.2022 Arfeen, Syed T, MD Psychiatry follow up for anxiety and PTSD . 04.13.2022 Morris, [name], LCSW Psychiatry-patient present for follow up visit for anxiety . 03.30.2022 Morris, [name], LCSW Psychiatry-patient present for follow up visit for anxiety . 03.28.2022 Esterwood, Amy S, PA-C Gastroenterology initial appointment prep kit for colonoscopy prescribed . 03.16.2022 Morris, [name], LCSW Psychiatry-patient present for follow up visit for anxiety . 03.02.2022 Morris, [name], LCSW Psychiatry-patient present for follow up visit for anxiety . 02.23.2022 Arfeen, Syed T, MD Psychiatry follow up for anxiety and PTSD o Medication changes o Amitriptyline HCl 100 mg Oral Daily at bedtime  o Lurasidone HCl 60 mg Oral Daily with breakfast  . 02.01.2022 Morris, [name], LCSW Psychiatry-patient present for follow up visit for anxiety . 01.18.2022 Morris, [name], LCSW Psychiatry-patient present for follow up visit for anxiety, PTSD . 01.05.2022 Morris, [name], LCSW Psychiatry-patient present for follow up visit for anxiety . 12.28.2021 Arfeen, Syed T, MD Psychiatry follow up visit for anxiety, PTSD and bipolar . 12.22.2021 Morris, [name], LCSW Psychiatry-patient  present for follow up visit for anxiety . 12.07.2022 Morris, [name], LCSW Psychiatry-patient present for follow up visit for anxiety Hospital visits:  None in previous 6 months  Medications: Outpatient Encounter Medications as of 02/12/2021  Medication Sig  . ACCU-CHEK AVIVA PLUS test strip USE AS DIRECTED UP TO 4 TIMES A DAY  . Accu-Chek Softclix Lancets lancets USE AS DIRECTED UP TO 4 TIMES A DAY  . amitriptyline (ELAVIL) 75 MG tablet Take 1 tablet (75 mg total) by mouth at bedtime.  . bismuth subsalicylate (PEPTO BISMOL) 262 MG/15ML suspension Take 30 mLs by mouth every 6 (six) hours as needed.  . blood glucose meter kit and supplies KIT Dispense based on patient and insurance preference. Use up to four times daily as directed. (FOR ICD-9 250.00, 250.01).  . Blood Pressure Monitoring (BLOOD PRESSURE MONITOR/L CUFF) MISC 1 Device by Does not apply route daily.  . cetirizine (ZYRTEC) 10 MG tablet TAKE 1 TABLET BY MOUTH EVERY DAY  . Cholecalciferol (VITAMIN D-3) 125 MCG (5000 UT) TABS Take 1 tablet by mouth daily.  . diazepam (VALIUM) 2 MG tablet Take two to three tab as needed for anxiety  . diclofenac (VOLTAREN) 75 MG EC tablet TAKE 1 TABLET (75 MG TOTAL) BY MOUTH 2 (TWO) TIMES DAILY AS NEEDED FOR MODERATE PAIN.  . diclofenac Sodium (VOLTAREN) 1 % GEL Apply topically as needed.  . famotidine (PEPCID) 40 MG tablet Take 1 tablet (40 mg total) by mouth daily.  . Ferrous Sulfate (IRON) 325 (65 Fe) MG TABS Take 1 tablet by mouth daily.  . furosemide (LASIX) 20 MG tablet TAKE 1 TABLET BY MOUTH EVERY DAY  . levothyroxine (SYNTHROID) 50 MCG tablet TAKE 1 TABLET BY MOUTH EVERY DAY  . Lurasidone   HCl 60 MG TABS Take 1 tablet (60 mg total) by mouth daily with breakfast.  . methocarbamol (ROBAXIN) 750 MG tablet TAKE 1 TABLET (750 MG TOTAL) BY MOUTH EVERY 8 (EIGHT) HOURS AS NEEDED FOR MUSCLE SPASMS.  . Multiple Vitamins-Minerals (MULTIVITAMIN ADULT PO) Take 1 tablet by mouth daily.  . omeprazole  (PRILOSEC) 40 MG capsule Take 1 capsule (40 mg total) by mouth daily. (Patient taking differently: Take 40 mg by mouth daily. Takes as needed in the pm)  . PEG-KCl-NaCl-NaSulf-Na Asc-C (PLENVU) 140 g SOLR Take 1 kit by mouth as directed.  . pregabalin (LYRICA) 50 MG capsule TAKE 1 CAPSULE BY MOUTH TWICE A DAY   No facility-administered encounter medications on file as of 02/12/2021.   I spoke with the patient about medication inherence. She states that when she last saw her primary care in February, she was prescribed Pepcid. She states now that she had stopped taking the medication one month ago. She said her acid reflux was worse while taking this. The patient went back on omeprazole. There were also some changes from her psychiatrist her amitriptyline and Lurasidone were reduced. There have been no other side effects from her medications. There have been no changes to her current medications. She states that she is due for a colonoscopy on June 3rd. She states that she is trying to get her health back in order. She states that she has increased her water and vegetable intake. She has another follow-up visit with her PCP in June period there have been no urgent care or emergency department visits since her last PCP or CCP visit. She denies any current issues with her pharmacy. A new CCM appointment was made for July 20 th 22 at 3:00 PM.  Star Rating Drugs: None   Amilia (Mimi) Frazier, CMA Clinical Pharmacist Assistant [phone]    

## 2021-02-13 ENCOUNTER — Other Ambulatory Visit: Payer: Self-pay

## 2021-02-13 ENCOUNTER — Ambulatory Visit (INDEPENDENT_AMBULATORY_CARE_PROVIDER_SITE_OTHER): Payer: Medicare HMO | Admitting: Psychiatry

## 2021-02-13 ENCOUNTER — Encounter (HOSPITAL_COMMUNITY): Payer: Self-pay | Admitting: Psychiatry

## 2021-02-13 DIAGNOSIS — F419 Anxiety disorder, unspecified: Secondary | ICD-10-CM | POA: Diagnosis not present

## 2021-02-13 NOTE — Progress Notes (Signed)
Virtual Visit via Video Note  I connected with [name] on 02/13/21 at  3:30 PM EDT by a video enabled telemedicine application and verified that I am speaking with the correct person using two identifiers.  Location: Patient: Patient Home Provider: Home Office  I discussed the limitations of evaluation and management by telemedicine and the availability of in person appointments. The patient expressed understanding and agreed to proceed.  History of Present Illness: Bipolar 1, Anxiety and PTSD  Treatment Plan Goals: 1)Euva would like to implement healthy habits, such as decrease sugar intake, walk daily, meal prep and plan grocery list to incorporated healthier purchases, in daily life to improve health, loose weight, and to feel more confident and less guilty.  2)[name] would like to improve relationship with her daughter and granddaughter, through communicating with them daily or every other day, cleaning and organizing home so they are more comfortable visiting her and through monthly face to face interactions else where.  Observations/Objective: Counselor met with Client for individual therapy via Webex. Counselor assessed MH symptoms and progress on treatment plan goals,clientstating that she would participate in session today, however she is concerned that she may be sick, due to fatigue, digestive issues, headache and body aches. Counselor encouraged Client to seek medical attention is symptoms worsen and to contact her daughter to monitor symptoms, since she lives alone. Client presents withmoderatedepression and mildanxiety. Client denied suicidal ideation or self-harm behaviors.  Goal 1:Counselor assessed daily functioning and implementation of strategies.Client reports no weight loss or adherence to a lifestyle plan to address weight and other health issues. Client discussed that she has had limited progress on goal due to lack of motivation and physical  health symptoms. Counselor provided Client information on motivating thought processes and behaviors, as skills to consider once physical health has improved. Client willing to apply skills and observe outcomes.   Goal 2:Counselor assessed progress in relationship building with daughter.Client noted that she has talked with daughter more frequently since last session.Client to contact daughter to discuss physical health concerns and to call resources discussed in session together to better manage health and finances, which is a major life stressor contributing to overall mental health status.  Assessment and Plan: Counselor will continue to meet with patient to address treatment plan goals. Patient will continue to follow recommendations of providers and implement skills learned in session.  Follow Up Instructions: Counselor will send information for next session via Webex.   The patient was advised to call back or seek an in-person evaluation if the symptoms worsen or if the condition fails to improve as anticipated.  I provided84mnutes of non-face-to-face time during this encounter.   BLise Auer LCSW

## 2021-02-15 ENCOUNTER — Other Ambulatory Visit: Payer: Self-pay | Admitting: Family Medicine

## 2021-02-15 ENCOUNTER — Ambulatory Visit: Payer: Medicare HMO | Admitting: Family Medicine

## 2021-02-15 ENCOUNTER — Telehealth: Payer: Self-pay | Admitting: Pharmacist

## 2021-02-15 NOTE — Telephone Encounter (Signed)
I am ok with tresiba and ozempic. Maybe just start with tresiba for a few nights before trying ozempic (maybe tresiba through weekend alone).

## 2021-02-15 NOTE — Telephone Encounter (Signed)
Called patient to discuss significantly elevated blood sugar readings over the last 2 days. Patient is agreeable to start both a GLP1 and long acting insulin after discussion with PCP.  Educated on side effects and mechanism of action with both GLP1 and long acting insulin. Patient agreed to start insulin today and plans to start GLP1 tomorrow.  Scheduled follow up call for Wed June 1st to assess tolerability and BG readings.

## 2021-02-19 ENCOUNTER — Telehealth: Payer: Self-pay

## 2021-02-19 ENCOUNTER — Telehealth: Payer: Self-pay | Admitting: Pharmacist

## 2021-02-19 MED ORDER — ACCU-CHEK AVIVA PLUS VI STRP: ORAL_STRIP | 1 refills | Status: DC

## 2021-02-19 NOTE — Telephone Encounter (Signed)
Prescriptions called in for Ozempic 0.25 mg weekly and Tresiba 10 units daily along with pen needles and lancets per discussion with PCP.  Patient verbalized her understanding to start the [place] first (Friday 5/27)  and then start Ozempic on Monday 5/31.  Discussed side effects of medications and recommended continued checking of blood sugars at home. Follow up telephone appt scheduled for 6/1 @4 :30pm.

## 2021-02-19 NOTE — Chronic Care Management (AMB) (Signed)
    Chronic Care Management Pharmacy Assistant   Name: Tiffany Reese  MRN: [id number] DOB: Jan 27, 1961  02/19/21 - called patient to remind of appointment with (CPP) on (date and time of appointment)   No answer, left message of appointment date, time and type of appointment (either telephone or in person). Left message to have all medications, supplements, blood pressure and/or blood sugar logs available during appointment and to return call if need to reschedule.  Joycelyn Das CMA  Clinical Pharmacist Assistant [phone]

## 2021-02-19 NOTE — Telephone Encounter (Signed)
-----   Message from Verner Chol, [facility] sent at 02/19/2021 11:26 AM EDT ----- Regarding: Test strips refill Hi,  [facility] [address]? Should be accu-check aviva plus and same directions as testing 4 times a day.  Thank you! Maddie

## 2021-02-20 ENCOUNTER — Ambulatory Visit: Payer: Medicare HMO | Admitting: Pharmacist

## 2021-02-20 DIAGNOSIS — E1165 Type 2 diabetes mellitus with hyperglycemia: Secondary | ICD-10-CM

## 2021-02-20 DIAGNOSIS — K219 Gastro-esophageal reflux disease without esophagitis: Secondary | ICD-10-CM

## 2021-02-20 NOTE — Progress Notes (Signed)
 Chronic Care Management Pharmacy Note  02/20/2021 Name:  Tiffany Reese MRN:  [id number] DOB:  05/16/1961  Summary: BGs are elevated and consistently > 200  Recommendations/Changes made from today's visit: -Recommended continuation of Tresiba 10 units daily until Saturday and titrate up by 1 unit every day if fasting BGs > 130.  Plan: Follow up in 10 days for DM assessment.  Subjective: Tiffany Reese is an 59 y.o. year old female who is a primary patient of Koberlein, Junell C, MD.  The CCM team was consulted for assistance with disease management and care coordination needs.    Engaged with patient by telephone for follow up visit in response to provider referral for pharmacy case management and/or care coordination services.   Consent to Services:  The patient was given information about Chronic Care Management services, agreed to services, and gave verbal consent prior to initiation of services.  Please see initial visit note for detailed documentation.   Patient Care Team: Koberlein, Junell C, MD as PCP - General (Family Medicine) , Madeline G, RPH as Pharmacist (Pharmacist)  Recent office visits: 02.18.2022 Koberlein, Junell C, MD video visit: presented for follow up on lab results and type 2 diabetes. No med changes.  02.09.2022 Koberlein, Junell C, MD - presented for HTN follow-up. Started famotidine 40 mg Oral Daily. Patient stopped losartan due to low BP. Prescribed tramadol HCl 50 mg Oral Daily PRN.   Recent consult visits: 02/13/21 [name] Morris, LCSW (behavioral health): Patient presented for counseling follow up.   01/09/21 Syed Arfeen, MD (behavioral health): Patient presented for PTSD and anxiety follow up. Plan to keep the Valium 2 mg and she can take up to 3 times a day to help her anxiety and nervousness.  12/17/20 Amy Easterwood, PA-C (gastro): Patient presented for evaluation of LFTs. Plan to repeat labs and for colonoscopy.  Hospital  visits: None in previous 6 months   Objective:  Lab Results  Component Value Date   CREATININE 1.04 10/31/2020   BUN 18 10/31/2020   GFR 59.71 (L) 10/31/2020   GFRNONAA >60 11/05/2018   GFRAA >60 11/05/2018   NA 139 10/31/2020   K 4.1 10/31/2020   CALCIUM 10.3 10/31/2020   CO2 26 10/31/2020   GLUCOSE 171 (H) 10/31/2020    Lab Results  Component Value Date/Time   HGBA1C 7.6 (H) 10/31/2020 03:47 PM   HGBA1C 6.3 02/27/2020 12:54 PM   GFR 59.71 (L) 10/31/2020 03:47 PM   GFR 62.99 02/27/2020 12:54 PM   MICROALBUR 1.1 02/27/2020 12:54 PM    Last diabetic Eye exam:  Lab Results  Component Value Date/Time   HMDIABEYEEXA No Retinopathy 07/18/2019 12:00 AM    Last diabetic Foot exam: No results found for: HMDIABFOOTEX   Lab Results  Component Value Date   CHOL 233 (H) 10/31/2020   HDL 50.50 10/31/2020   LDLCALC 143 (H) 10/31/2020   TRIG 197.0 (H) 10/31/2020   CHOLHDL 5 10/31/2020    Hepatic Function Latest Ref Rng & Units 02/01/2021 12/17/2020 10/31/2020  Total Protein 6.0 - 8.3 g/dL 8.3 8.3 7.7  Albumin 3.5 - 5.2 g/dL 4.7 4.8 4.7  AST 0 - 37 U/L 94(H) 90(H) 199(H)  ALT 0 - 35 U/L 125(H) 112(H) 167(H)  Alk Phosphatase 39 - 117 U/L 179(H) 149(H) 156(H)  Total Bilirubin 0.2 - 1.2 mg/dL 0.6 0.3 0.4  Bilirubin, Direct 0.0 - 0.3 mg/dL 0.1 0.1 -    Lab Results  Component Value Date/Time   TSH 2.71 10/31/2020   03:47 PM   TSH 1.89 02/27/2020 12:54 PM    CBC Latest Ref Rng & Units 10/31/2020 02/27/2020 02/07/2019  WBC 4.0 - 10.5 K/uL 7.8 7.3 9.1  Hemoglobin 12.0 - 15.0 g/dL 14.3 13.2 12.4  Hematocrit 36.0 - 46.0 % 43.0 39.7 37.3  Platelets 150.0 - 400.0 K/uL 212.0 210.0 251.0    Lab Results  Component Value Date/Time   VD25OH 49.43 02/27/2020 12:54 PM   VD25OH 32.38 11/01/2018 11:12 AM    Clinical ASCVD: No  The 10-year ASCVD risk score (Goff DC Jr., et al., 2013) is: 8.8%   Values used to calculate the score:     Age: 59 years     Sex: Female     Is Non-Hispanic  African American: No     Diabetic: Yes     Tobacco smoker: No     Systolic Blood Pressure: 137 mmHg     Is BP treated: Yes     HDL Cholesterol: 50.5 mg/dL     Total Cholesterol: 233 mg/dL    Depression screen PHQ 2/9 10/31/2020 02/27/2020 03/30/2019  Decreased Interest 2 3 3  Down, Depressed, Hopeless 1 2 3  PHQ - 2 Score 3 5 6  Altered sleeping 0 1 0  Tired, decreased energy 3 3 3  Change in appetite 0 1 0  Feeling bad or failure about yourself  3 3 3  Trouble concentrating 0 1 0  Moving slowly or fidgety/restless 0 0 0  Suicidal thoughts 0 0 0  PHQ-9 Score 9 14 12  Difficult doing work/chores - - -  Some recent data might be hidden      Social History   Tobacco Use  Smoking Status Former   Pack years: 0.00   Types: Cigarettes  Smokeless Tobacco Never  Tobacco Comments   only smoked a couple of years   BP Readings from Last 3 Encounters:  02/22/21 137/83  12/17/20 140/80  11/09/20 (!) 141/83   Pulse Readings from Last 3 Encounters:  02/22/21 88  12/17/20 90  11/09/20 86   Wt Readings from Last 3 Encounters:  02/22/21 285 lb (129.3 kg)  01/09/21 287 lb (130.2 kg)  12/17/20 285 lb (129.3 kg)   BMI Readings from Last 3 Encounters:  02/22/21 45.31 kg/m  01/09/21 45.63 kg/m  12/17/20 45.31 kg/m    Assessment/Interventions: Review of patient past medical history, allergies, medications, health status, including review of consultants reports, laboratory and other test data, was performed as part of comprehensive evaluation and provision of chronic care management services.   SDOH:  (Social Determinants of Health) assessments and interventions performed: No  SDOH Screenings   Alcohol Screen: Not on file  Depression (PHQ2-9): Medium Risk   PHQ-2 Score: 9  Financial Resource Strain: Not on file  Food Insecurity: Not on file  Housing: Not on file  Physical Activity: Not on file  Social Connections: Not on file  Stress: Not on file  Tobacco Use: Medium Risk    Smoking Tobacco Use: Former   Smokeless Tobacco Use: Never  Transportation Needs: No Transportation Needs   Lack of Transportation (Medical): No   Lack of Transportation (Non-Medical): No    CCM Care Plan  Allergies  Allergen Reactions   Bupropion Other (See Comments)    Causes agitation   Duloxetine Other (See Comments)    Causes involuntary movements   Sulfa Antibiotics    Metformin And Related     vomiting   Paxil [Paroxetine Hcl]       Insomnia; manic   Prozac [Fluoxetine Hcl]     Hyperactive, insomnia   Trintellix [Vortioxetine]     Triggers fibromyalgia   Zoloft [Sertraline Hcl]     Hyperactivity, insomnia    Medications Reviewed Today     Reviewed by [name], LCSW (Social Worker) on 02/27/21 at 1653  Med List Status: <None>   Medication Order Taking? Sig Documenting Provider Last Dose Status Informant  Accu-Chek Softclix Lancets lancets [id number] No USE AS DIRECTED UP TO 4 TIMES A DAY Koberlein, Junell C, MD 02/22/2021 Active   amitriptyline (ELAVIL) 50 MG tablet [id number] No Take 1 tablet (50 mg total) by mouth at bedtime. [name], MD 02/21/2021 Active   bismuth subsalicylate (PEPTO BISMOL) 262 MG/15ML suspension [id number] No Take 30 mLs by mouth every 6 (six) hours as needed.  Patient not taking: Reported on 02/22/2021   [provider] Not Taking Active   blood glucose meter kit and supplies KIT [id number] No Dispense based on patient and insurance preference. Use up to four times daily as directed. (FOR ICD-9 250.00, 250.01). Caren Macadam, MD 02/22/2021 Active   Blood Pressure Monitoring (BLOOD PRESSURE MONITOR/L CUFF) MISC [id number] No 1 Device by Does not apply route daily. Caren Macadam, MD 02/22/2021 Active   cetirizine (ZYRTEC) 10 MG tablet [id number] No TAKE 1 TABLET BY MOUTH EVERY DAY Koberlein, Junell C, MD 02/22/2021 Active   Cholecalciferol (VITAMIN D-3) 125 MCG (5000 UT) TABS [id number] No Take 1 tablet by mouth daily. [provider] Past Week Active   diazepam (VALIUM) 2 MG tablet [id number] No Take two to three tab as needed for anxiety [name], MD 02/22/2021 Active   diclofenac (VOLTAREN) 75 MG EC tablet [id number] No TAKE 1 TABLET (75 MG TOTAL) BY MOUTH 2 (TWO) TIMES DAILY AS NEEDED FOR MODERATE PAIN. Caren Macadam, MD 02/22/2021 Active   diclofenac Sodium (VOLTAREN) 1 % GEL [id number] No Apply topically as needed.  Patient not taking: Reported on 02/22/2021   [provider] Not Taking Active Self  Dulaglutide (TRULICITY [place]) [id number] No Inject into the skin. [provider] 02/21/2021 Active   famotidine (PEPCID) 40 MG tablet [id number] No Take 1 tablet (40 mg total) by mouth daily.  Patient not taking: Reported on 02/22/2021   Caren Macadam, MD Not Taking Active   Ferrous Sulfate (IRON) 325 (65 Fe) MG TABS [id number] No Take 1 tablet by mouth daily. [provider] 02/21/2021 Active Self  furosemide (LASIX) 20 MG tablet [id number] No TAKE 1 TABLET BY MOUTH EVERY DAY Koberlein, Junell C, MD Past Week Active   glucose blood (ACCU-CHEK AVIVA PLUS) test strip [id number] No USE AS DIRECTED UP TO 4 TIMES A DAY Koberlein, Junell C, MD 02/22/2021 Active   hydrocortisone-pramoxine (ANALPRAM HC) 2.5-1 % rectal cream [id number]  Use Analpram HC cream 2.5% ,apply externally BID for 7 days Mauri Pole, MD  Active   levothyroxine (SYNTHROID) 50 MCG tablet [id number] No TAKE 1 TABLET BY MOUTH EVERY DAY Koberlein, Junell C, MD 02/22/2021 Active   Lurasidone HCl 60 MG TABS [id number] No Take 1 tablet (60 mg total) by mouth daily with breakfast. [name], MD 02/22/2021 Active   methocarbamol (ROBAXIN) 750 MG tablet [id number] No TAKE 1 TABLET (750 MG TOTAL) BY MOUTH EVERY 8 (EIGHT) HOURS AS NEEDED FOR MUSCLE SPASMS. Caren Macadam, MD 02/22/2021 Active   Multiple Vitamins-Minerals (MULTIVITAMIN ADULT PO) [id number] No Take 1 tablet by mouth daily. [provider] Past  Week Active  Self  omeprazole (PRILOSEC) 40 MG capsule [id number] No Take 1 capsule (40 mg total) by mouth daily.  Patient taking differently: Take 40 mg by mouth daily. Takes as needed in the pm   Caren Macadam, MD 02/22/2021 Active   pregabalin (LYRICA) 50 MG capsule [id number] No TAKE 1 CAPSULE BY MOUTH TWICE A DAY Koberlein, Junell C, MD 02/22/2021 Active   Semaglutide (OZEMPIC, 0.25 OR 0.5 MG/DOSE, [place]) [id number]  Inject 0.25 mg into the skin. [provider]  Active             Patient Active Problem List   Diagnosis Date Noted   Elevated d-dimer 11/16/2018   At risk for adverse drug reaction 11/10/2018   GERD (gastroesophageal reflux disease)    Hypertension    Diabetes mellitus type II, controlled ([place])    PTSD (post-traumatic stress disorder) 11/01/2018   Anxiety 11/01/2018   Depression, recurrent ([place]) 11/01/2018   Closed left ankle fracture 11/01/2018   Prediabetes 11/01/2018   Anemia 11/01/2018   Hypothyroid 11/01/2018   Fatty liver disease, nonalcoholic 81/44/8185   BMI 45.0-49.9, adult ([place]) 04/29/2018   DDD (degenerative disc disease), thoracolumbar 04/29/2018    Immunization History  Administered Date(s) Administered   Influenza,inj,Quad PF,6+ Mos 06/22/2018   Influenza-Unspecified 07/19/2020   Janssen (J&J) SARS-COV-2 Vaccination 01/25/2020, 07/19/2020    Conditions to be addressed/monitored:  Hypertension, Hyperlipidemia, Diabetes, GERD, Depression, Anxiety and Allergic Rhinitis  Care Plan : [place]  Updates made by Viona Gilmore, [place] since 03/01/2021 12:00 AM     Problem: Problem: Hypertension, Hyperlipidemia, Diabetes, GERD, Depression, Anxiety and Allergic Rhinitis      Long-Range Goal: Patient-Specific Goal   Start Date: 02/20/2021  Expected End Date: 02/20/2022  This Visit's Progress: On track  Priority: High  Note:   Current Barriers:  Unable to achieve control of diabetes   Pharmacist Clinical Goal(s):  Patient will achieve  control of diabetes as evidenced by A1c and home blood sugar readings  through collaboration with PharmD and provider.   Interventions: 1:1 collaboration with Caren Macadam, MD regarding development and update of comprehensive plan of care as evidenced by provider attestation and co-signature Inter-disciplinary care team collaboration (see longitudinal plan of care) Comprehensive medication review performed; medication list updated in electronic medical record  Hypertension (BP goal <130/80) -Not ideally controlled -Current treatment: Furosemide 20 mg 1 tablet daily -Medications previously tried: none  -Current home readings: did not provide -Current dietary habits: did not discuss -Current exercise habits: did not discuss -Denies hypotensive/hypertensive symptoms -Educated on Exercise goal of 150 minutes per week; Importance of home blood pressure monitoring; -Counseled to monitor BP at home weekly, document, and provide log at future appointments -Counseled on diet and exercise extensively Recommended to continue current medication  Hyperlipidemia: (LDL goal < 100) -Uncontrolled -Current treatment: No medications -Medications previously tried: none  -Current dietary patterns: did not discuss -Current exercise habits: did not discuss -Educated on Cholesterol goals;  Benefits of statin for ASCVD risk reduction; Importance of limiting foods high in cholesterol; Exercise goal of 150 minutes per week; -Counseled on diet and exercise extensively  Diabetes (A1c goal <7%) -Uncontrolled  -Current medications: Tresiba 100 units/ml inject 10 units daily Ozempic inject 0.25 mg once weekly -Medications previously tried: none  -Current home glucose readings fasting glucose: 482, 354, 292, 333, 334, 291 post prandial glucose: 390, 412, 450, 373, 343 -Denies hypoglycemic/hyperglycemic symptoms -Current meal patterns:  breakfast: slim fast lunch: did not  discuss  dinner: did  not discuss snacks: did not discuss drinks: sodas -Current exercise: did not discuss -Educated on A1c and blood sugar goals; Benefits of routine self-monitoring of blood sugar; Carbohydrate counting and/or plate method -Counseled to check feet daily and get yearly eye exams -Counseled on diet and exercise extensively Recommended increasing  Depression/Anxiety (Goal: minimize symptoms) -Not ideally controlled -Current treatment: Lurasidone 60 mg 1 tablet daily with breakfast Amitriptyline 50 mg 1 tablet daily at bedtime Diazepam 2 mg 2-3 tablets as needed for anxiety -Medications previously tried/failed: n/a -PHQ9: 9 -GAD7: n/a -Educated on Benefits of medication for symptom control Benefits of cognitive-behavioral therapy with or without medication -Recommended to continue current medication  GERD (Goal: minimize symptoms) -Controlled -Current treatment  Omeprazole 40 mg 1 capsule twice daily -Medications previously tried: Famotidine (side effects)  -Recommended to continue current medication Counseled on non-pharmacologic management of symptoms such as elevating the head of your bed, avoiding eating 2-3 hours before bed, avoiding triggering foods such as acidic, spicy, or fatty foods, eating smaller meals, and wearing clothes that are loose around the waist  Hypothyroidism (Goal: TSH 0.35-4.5) -Controlled -Current treatment  Levothyroxine 50 mcg 1 tablet daily -Medications previously tried: none  -Recommended to continue current medication  Pain/muscle spasms (Goal: minimize pain) -Controlled -Current treatment  Pregabalin 50 mg 1 capsule twice daily Methocarbamol 750 mg 1 tablet every 8 hours as needed Diclofenac 75 mg 1 tablet twice daily as needed Diclofenac gel 1% apply topically as needed -Medications previously tried: n/a  -Recommended to continue current medication  Allergic rhinitis (Goal: minimize symptoms) -Controlled -Current treatment  Cetirizine 10 mg  1 tablet daily -Medications previously tried: none  -Recommended to continue current medication   Health Maintenance -Vaccine gaps: shingrix, COVID booster, Pneumovax -Current therapy:  Multivitamin 1 tablet daily Ferrous sulfate 325 mg 1 tablet daily Pepto bismol as needed -Educated on Cost vs benefit of each product must be carefully weighed by individual consumer -Patient is satisfied with current therapy and denies issues -Recommended to continue current medication  Patient Goals/Self-Care Activities Patient will:  - take medications as prescribed check glucose 3 times daily, document, and provide at future appointments engage in dietary modifications by decreasing carb intake  Follow Up Plan: Telephone follow up appointment with care management team member scheduled for: 10 days      Medication Assistance: None required.  Patient affirms current coverage meets needs.  Compliance/Adherence/Medication fill history: Care Gaps: Shingrix, prevnar, covid booster, urine microalbumin, foot exam, pap smear, mammogram  Star-Rating Drugs: Losartan - [facility]  [facility]:  [id number] [place] [place] - [address] [place] [address] Phone: [phone] Fax: [phone]  Uses pill box? No - did not ask Pt endorses 90% compliance  We discussed: Current pharmacy is preferred with insurance plan and patient is satisfied with pharmacy services Patient decided to: Continue current medication management strategy  Care Plan and Follow Up Patient Decision:  Patient agrees to Care Plan and Follow-up.  Plan: Telephone follow up appointment with care management team member scheduled for:  10 days  Madeline , PharmD BCACP Clinical Pharmacist [facility] [phone]       

## 2021-02-21 ENCOUNTER — Telehealth: Payer: Self-pay | Admitting: Physician Assistant

## 2021-02-21 NOTE — Telephone Encounter (Signed)
Inbound call from patient. Have procedure scheduled for 02/22/21. States her blood sugar level is currently 289. She wants to talk to someone before she starts her procedure. Best contact [phone]

## 2021-02-21 NOTE — Telephone Encounter (Signed)
Called and spoke with patient. She was put on [place] and Ozempic. She has already taken her Ozempic and takes the [place] in the morning. I have advised her that she will need to hold it tomorrow morning. She is concerned about going through her prep and coming in tomorrow and not being able to do her Colonoscopy because her blood sugar is too high. Right now she is 289. Will this bee too high for her procedure tomorrow?

## 2021-02-21 NOTE — Telephone Encounter (Signed)
Called and spoke with patient, she advised that her blood sugar was fasting. While I was on the phone with her blood sugar was 273, which was not fasting as she recently had had chicken broth. We discussed that she needed to be sure that she avoids sugar to help keep her blood sugar down and make sure that she increases her water intake. She decided to keep her appointment for tomorrow, after advising her Dr. Elana Alm next appointment would be August.

## 2021-02-21 NOTE — Telephone Encounter (Signed)
Ok thanks 

## 2021-02-21 NOTE — Telephone Encounter (Signed)
Patient calling again to ask if she should begin prep/or if the procedure will be cancelled?  Plz advise  thanks

## 2021-02-21 NOTE — Telephone Encounter (Signed)
Please check if its fasting blood sugar of 289 or is after she had something to drink?  Our cut off is Blood sugar below 350

## 2021-02-22 ENCOUNTER — Ambulatory Visit (AMBULATORY_SURGERY_CENTER): Payer: Medicare HMO | Admitting: Gastroenterology

## 2021-02-22 ENCOUNTER — Encounter: Payer: Self-pay | Admitting: Gastroenterology

## 2021-02-22 ENCOUNTER — Other Ambulatory Visit: Payer: Self-pay

## 2021-02-22 VITALS — BP 137/83 | HR 88 | Temp 97.7°F | Resp 17 | Ht 66.5 in | Wt 285.0 lb

## 2021-02-22 DIAGNOSIS — Z860100 Personal history of colon polyps, unspecified: Secondary | ICD-10-CM

## 2021-02-22 DIAGNOSIS — Z800 Family history of malignant neoplasm of digestive organs: Secondary | ICD-10-CM

## 2021-02-22 MED ORDER — SODIUM CHLORIDE 0.9 % IV SOLN: 500.0000 mL | Freq: Once | INTRAVENOUS | Status: DC

## 2021-02-22 MED ORDER — HYDROCORT-PRAMOXINE (PERIANAL) 2.5-1 % EX CREA
TOPICAL_CREAM | CUTANEOUS | 0 refills | Status: DC
Start: 2021-02-22 — End: 2021-06-19

## 2021-02-22 NOTE — Patient Instructions (Signed)
Analpram HC cream 2.5% apply externally BID for 7 days- sent to your pharmacy  Recticare ( over the counter ) small amount per rectum three times daily as needed    Handout on hemorrhoids given to you today   YOU HAD AN ENDOSCOPIC PROCEDURE TODAY AT THE [facility]:   Refer to the procedure report that was given to you for any specific questions about what was found during the examination.  If the procedure report does not answer your questions, please call your gastroenterologist to clarify.  If you requested that your care partner not be given the details of your procedure findings, then the procedure report has been included in a sealed envelope for you to review at your convenience later.  YOU SHOULD EXPECT: Some feelings of bloating in the abdomen. Passage of more gas than usual.  Walking can help get rid of the air that was put into your GI tract during the procedure and reduce the bloating. If you had a lower endoscopy (such as a colonoscopy or flexible sigmoidoscopy) you may notice spotting of blood in your stool or on the toilet paper. If you underwent a bowel prep for your procedure, you may not have a normal bowel movement for a few days.  Please Note:  You might notice some irritation and congestion in your nose or some drainage.  This is from the oxygen used during your procedure.  There is no need for concern and it should clear up in a day or so.  SYMPTOMS TO REPORT IMMEDIATELY:   Following lower endoscopy (colonoscopy or flexible sigmoidoscopy):  Excessive amounts of blood in the stool  Significant tenderness or worsening of abdominal pains  Swelling of the abdomen that is new, acute  Fever of 100F or higher   Following upper endoscopy (EGD)  Vomiting of blood or coffee ground material  New chest pain or pain under the shoulder blades  Painful or persistently difficult swallowing  New shortness of breath  Fever of 100F or higher  Black, tarry-looking  stools  For urgent or emergent issues, a gastroenterologist can be reached at any hour by calling [phone]. Do not use MyChart messaging for urgent concerns.    DIET:  We do recommend a small meal at first, but then you may proceed to your regular diet.  Drink plenty of fluids but you should avoid alcoholic beverages for 24 hours.  ACTIVITY:  You should plan to take it easy for the rest of today and you should NOT DRIVE or use heavy machinery until tomorrow (because of the sedation medicines used during the test).    FOLLOW UP: Our staff will call the number listed on your records 48-72 hours following your procedure to check on you and address any questions or concerns that you may have regarding the information given to you following your procedure. If we do not reach you, we will leave a message.  We will attempt to reach you two times.  During this call, we will ask if you have developed any symptoms of COVID 19. If you develop any symptoms (ie: fever, flu-like symptoms, shortness of breath, cough etc.) before then, please call [phone].  If you test positive for Covid 19 in the 2 weeks post procedure, please call and report this information to [place].    If any biopsies were taken you will be contacted by phone or by letter within the next 1-3 weeks.  Please call us at [phone] if you  have not heard about the biopsies in 3 weeks.    SIGNATURES/CONFIDENTIALITY: You and/or your care partner have signed paperwork which will be entered into your electronic medical record.  These signatures attest to the fact that that the information above on your After Visit Summary has been reviewed and is understood.  Full responsibility of the confidentiality of this discharge information lies with you and/or your care-partner.

## 2021-02-22 NOTE — Progress Notes (Signed)
pt tolerated well. VSS. awake and to recovery. Report given to RN.  

## 2021-02-22 NOTE — Progress Notes (Signed)
Medical history reviewed with no changes noted. VS assessed by C.W 

## 2021-02-22 NOTE — Op Note (Signed)
[facility] Patient Name: Tiffany Reese Procedure Date: 02/22/2021 2:35 PM MRN: [id number] Endoscopist: Napoleon Form , MD Age: 59 Referring MD:  Date of Birth: 1960-12-16 Gender: Female Account #: [id number] Procedure:                Colonoscopy Indications:              Colon cancer screening in patient at increased                            risk: Colorectal cancer in father Medicines:                Monitored Anesthesia Care Procedure:                Pre-Anesthesia Assessment:                           - Prior to the procedure, a History and Physical                            was performed, and patient medications and                            allergies were reviewed. The patient's tolerance of                            previous anesthesia was also reviewed. The risks                            and benefits of the procedure and the sedation                            options and risks were discussed with the patient.                            All questions were answered, and informed consent                            was obtained. Prior Anticoagulants: The patient has                            taken no previous anticoagulant or antiplatelet                            agents. ASA Grade Assessment: II - A patient with                            mild systemic disease. After reviewing the risks                            and benefits, the patient was deemed in                            satisfactory condition to undergo the procedure.  After obtaining informed consent, the colonoscope                            was passed under direct vision. Throughout the                            procedure, the patient's blood pressure, pulse, and                            oxygen saturations were monitored continuously. The                            Olympus PFC-[id number] (#[id number]) Colonoscope was                            introduced through the anus  and advanced to the the                            cecum, identified by appendiceal orifice and                            ileocecal valve. The colonoscopy was performed                            without difficulty. The patient tolerated the                            procedure well. The quality of the bowel                            preparation was excellent. The ileocecal valve,                            appendiceal orifice, and rectum were photographed. Scope In: 2:49:57 PM Scope Out: 3:07:51 PM Scope Withdrawal Time: 0 hours 9 minutes 11 seconds  Total Procedure Duration: 0 hours 17 minutes 54 seconds  Findings:                 The perianal and digital rectal examinations were                            normal.                           Non-bleeding external and internal hemorrhoids were                            found during retroflexion. The hemorrhoids were                            medium-sized.                           The exam was otherwise without abnormality. Complications:            No immediate complications. Estimated Blood Loss:  Estimated blood loss was minimal. Impression:               - Non-bleeding external and internal hemorrhoids.                           - The examination was otherwise normal.                           - No specimens collected. Recommendation:           - Patient has a contact number available for                            emergencies. The signs and symptoms of potential                            delayed complications were discussed with the                            patient. Return to normal activities tomorrow.                            Written discharge instructions were provided to the                            patient.                           - Resume previous diet.                           - Continue present medications.                           - Repeat colonoscopy in 5 years for screening                             purposes.                           - Use Analpram HC Cream 2.5%: Apply externally BID                            for 7 days.                           -Recticare (OTC) small amount per rectum TID as                            needed Napoleon Form, MD 02/22/2021 3:15:41 PM This report has been signed electronically.

## 2021-02-26 ENCOUNTER — Telehealth: Payer: Self-pay

## 2021-02-26 NOTE — Telephone Encounter (Signed)
  Follow up Call-  Call back number 02/22/2021  Post procedure Call Back phone  # [phone]  Permission to leave phone message Yes  Some recent data might be hidden     Patient questions:  Do you have a fever, pain , or abdominal swelling? Yes.   Pain Score  4 *- patient complaining of abdominal pain that is getting better since yesterday.   Have you tolerated food without any problems? No.  Have you been able to return to your normal activities? Yes.    Do you have any questions about your discharge instructions: Diet   No. Medications  No. Follow up visit  No.  Do you have questions or concerns about your Care? Yes.    Actions: * If pain score is 4 or above:   Pt request that we call her back later today after she has time to wake up some

## 2021-02-26 NOTE — Telephone Encounter (Signed)
Agree with Gas X 1 capsule upto three times daily as needed. She had procedure on 6/3 and did not have polypectomy or any intervention.  Continue with increased water intake, small meals and call with worsening or change in symptoms. Thanks

## 2021-02-26 NOTE — Telephone Encounter (Signed)
Called patient and relayed Dr. Elana Alm advise.  Patient verbalized understanding and agrees to call [phone] if her symptoms worsen.

## 2021-02-26 NOTE — Telephone Encounter (Signed)
  Follow up Call-  Call back number 02/22/2021  Post procedure Call Back phone  # [phone]  Permission to leave phone message Yes  Some recent data might be hidden     Patient questions:  Do you have a fever, pain , or abdominal swelling? Yes.   Pain Score  4 * pt reports pain to be 4-5/10 today.  States she hurts on right side of intestines and burning in her stomach.  Advised patient to move around as much as possible, and that drinking warm liquids and gas medication may help her pass air.  Have you tolerated food without any problems? No.  Yesterday drank tomato soup, sweet potato, and pimento cheese sandwich.  Pt reports no vomiting but did experience nausea.  Encouraged patient to avoid acidic foods for the next day or so since she reports "burning" in her stomach.  Have you been able to return to your normal activities? Yes.  pt reports not doing many activities yesterday but has done more today.  Do you have any questions about your discharge instructions: Diet   No. Medications  No. Follow up visit  No.  Do you have questions or concerns about your Care? No.  Actions: * If pain score is 4 or above: Physician/ provider Notified : Marsa Aris, MD. Date 02/26/2021 at Time 1110.

## 2021-02-26 NOTE — Telephone Encounter (Signed)
1. Have you developed a fever since your procedure? no  2.   Have you had an respiratory symptoms (SOB or cough) since your procedure? no  3.   Have you tested positive for COVID 19 since your procedure no  4.   Have you had any family members/close contacts diagnosed with the COVID 19 since your procedure?  no   If yes to any of these questions please route to Tracy Walton, RN and Denise Buckner, RN 

## 2021-02-27 ENCOUNTER — Ambulatory Visit (INDEPENDENT_AMBULATORY_CARE_PROVIDER_SITE_OTHER): Payer: Medicare HMO | Admitting: Psychiatry

## 2021-02-27 ENCOUNTER — Encounter (HOSPITAL_COMMUNITY): Payer: Self-pay | Admitting: Psychiatry

## 2021-02-27 ENCOUNTER — Other Ambulatory Visit: Payer: Self-pay

## 2021-02-27 DIAGNOSIS — F419 Anxiety disorder, unspecified: Secondary | ICD-10-CM

## 2021-02-27 NOTE — Progress Notes (Signed)
Virtual Visit via Video Note  I connected with [name] on 02/27/21 at  3:30 PM EDT by a video enabled telemedicine application and verified that I am speaking with the correct person using two identifiers.  Location: Patient: Patient Home Provider: Home Office   I discussed the limitations of evaluation and management by telemedicine and the availability of in person appointments. The patient expressed understanding and agreed to proceed.   History of Present Illness: Bipolar 1, Anxiety and PTSD   Treatment Plan Goals: 1) Johnelle would like to implement healthy habits, such as decrease sugar intake, walk daily, meal prep and plan grocery list to incorporated healthier purchases, in daily life to improve health, loose weight, and to feel more confident and less guilty.    2) Serin would like to improve relationship with her daughter and granddaughter, through communicating with them daily or every other day, cleaning and organizing home so they are more comfortable visiting her and through monthly face to face interactions else where.   Observations/Objective: Counselor met with Client for individual therapy via Webex. Counselor assessed MH symptoms and progress on treatment plan goals, client stating that she sought medical treatment after our last session, finding out that her mood and concerning change in functioning was related to mismanagement of diabetes resulting in high blood sugar. Client states she is monitoring more closely and making changes in daily habits. Client presents with moderate depression and moderate anxiety. Client denied suicidal ideation or self-harm behaviors.    Goal 1: Counselor assessed daily functioning and implementation of strategies. Client reports no weight loss or adherence to a lifestyle plan to address weight and other health issues. Counselor discussed cognitive-behavioral interventions to improve management of health condition. Client receptive  and motivated to make changes due to recent health scare and side effects. Client plans to introduce healthier food and beverage options, is checking blood sugar 3x daily and taking medications as prescribed. Client concerned with lack of sleep. Counselor shared information on sleep hygiene for Client to consider implementing.    Goal 2: Counselor assessed progress in relationship building with daughter. Client noted that she has talked with daughter more frequently since last session. Client reports that her daughter identified the medical issue 2 weeks ago and helped her seek medical treatment. Daughter is coming to check on her and ensure Client has food and medications needed. Client pleased at response and concern.    Assessment and Plan: Counselor will continue to meet with patient to address treatment plan goals. Patient will continue to follow recommendations of providers and implement skills learned in session.   Follow Up Instructions: Counselor will send information for next session via Webex.    The patient was advised to call back or seek an in-person evaluation if the symptoms worsen or if the condition fails to improve as anticipated.   I provided 40 minutes of non-face-to-face time during this encounter.     [name], LCSW

## 2021-03-01 ENCOUNTER — Other Ambulatory Visit: Payer: Self-pay | Admitting: Family Medicine

## 2021-03-01 ENCOUNTER — Ambulatory Visit: Payer: Medicare HMO | Admitting: Pharmacist

## 2021-03-01 ENCOUNTER — Telehealth (HOSPITAL_COMMUNITY): Payer: Self-pay | Admitting: *Deleted

## 2021-03-01 DIAGNOSIS — E1165 Type 2 diabetes mellitus with hyperglycemia: Secondary | ICD-10-CM

## 2021-03-01 DIAGNOSIS — K219 Gastro-esophageal reflux disease without esophagitis: Secondary | ICD-10-CM

## 2021-03-01 NOTE — Telephone Encounter (Signed)
Will call pt.

## 2021-03-01 NOTE — Telephone Encounter (Signed)
Writer spoke with pt regarding desire to increase Elavil. Writer shared that Dr. Lolly Mustache is advising trying Melatonin no more than 5 mg at hs. Writer reiterated that LFT's are still elevated so no increase in Elavil right now. Pt verbalizes understanding and says she will try to pick up some Melatonin this weekend and will update nurse/provider  on Monday.

## 2021-03-01 NOTE — Telephone Encounter (Signed)
Her liver enzymes are still high. I will wait until enzymes get better.  She can try melatonin up to 5 mg.

## 2021-03-01 NOTE — Progress Notes (Signed)
Chronic Care Management Pharmacy Note   Name:  Tiffany Reese MRN:  [id number] DOB:  14-Sep-1961  Summary: BGs are elevated and consistently > 200  Recommendations/Changes made from today's visit: -Recommended continuation of Tresiba 17 units daily and continued titration up by 1 unit every day if fasting BGs > 130. -Encouraged patient to start exercising in her pool to help lower her blood sugars.  Plan: Follow up in 3 weeks for DM assessment.  Subjective: Tiffany Reese is an 60 y.o. year old female who is a primary patient of Koberlein, Steele Berg, MD.  The CCM team was consulted for assistance with disease management and care coordination needs.    Engaged with patient by telephone for follow up visit in response to provider referral for pharmacy case management and/or care coordination services.   Consent to Services:  The patient was given information about Chronic Care Management services, agreed to services, and gave verbal consent prior to initiation of services.  Please see initial visit note for detailed documentation.   Patient Care Team: Caren Macadam, MD as PCP - General (Family Medicine) Viona Gilmore, [facility] as Pharmacist (Pharmacist)  Recent office visits: 02.18.2022 Caren Macadam, MD video visit: presented for follow up on lab results and type 2 diabetes. No med changes.  02.09.2022 Caren Macadam, MD - presented for HTN follow-up. Started famotidine 40 mg Oral Daily. Patient stopped losartan due to low BP. Prescribed tramadol HCl 50 mg Oral Daily PRN.   Recent consult visits: 02/28/21 Behavioral health telephone encounter: Recommended for patient to trial melatonin 5 mg as liver enzymes are still elevated.  02/27/21 [name], LCSW (behavioral health): Patient presented for counseling follow up.   02/22/21 Patient presented for colonoscopy.  02/13/21 [name], LCSW (behavioral health): Patient presented for counseling follow  up.   01/09/21 Berniece Andreas, MD (behavioral health): Patient presented for PTSD and anxiety follow up. Plan to keep the Valium 2 mg and she can take up to 3 times a day to help her anxiety and nervousness.  12/17/20 Amy Easterwood, PA-C (gastro): Patient presented for evaluation of LFTs. Plan to repeat labs and for colonoscopy.  Hospital visits: None in previous 6 months   Objective:  Lab Results  Component Value Date   CREATININE 1.04 10/31/2020   BUN 18 10/31/2020   GFR 59.71 (L) 10/31/2020   GFRNONAA >60 11/05/2018   GFRAA >60 11/05/2018   NA 139 10/31/2020   K 4.1 10/31/2020   CALCIUM 10.3 10/31/2020   CO2 26 10/31/2020   GLUCOSE 171 (H) 10/31/2020    Lab Results  Component Value Date/Time   HGBA1C 7.6 (H) 10/31/2020 03:47 PM   HGBA1C 6.3 02/27/2020 12:54 PM   GFR 59.71 (L) 10/31/2020 03:47 PM   GFR 62.99 02/27/2020 12:54 PM   MICROALBUR 1.1 02/27/2020 12:54 PM    Last diabetic Eye exam:  Lab Results  Component Value Date/Time   HMDIABEYEEXA No Retinopathy 07/18/2019 12:00 AM    Last diabetic Foot exam: No results found for: HMDIABFOOTEX   Lab Results  Component Value Date   CHOL 233 (H) 10/31/2020   HDL 50.50 10/31/2020   LDLCALC 143 (H) 10/31/2020   TRIG 197.0 (H) 10/31/2020   CHOLHDL 5 10/31/2020    Hepatic Function Latest Ref Rng & Units 02/01/2021 12/17/2020 10/31/2020  Total Protein 6.0 - 8.3 g/dL 8.3 8.3 7.7  Albumin 3.5 - 5.2 g/dL 4.7 4.8 4.7  AST 0 - 37 U/L 94(H) 90(H) 199(H)  ALT 0 - 35 U/L 125(H) 112(H) 167(H)  Alk Phosphatase 39 - 117 U/L 179(H) 149(H) 156(H)  Total Bilirubin 0.2 - 1.2 mg/dL 0.6 0.3 0.4  Bilirubin, Direct 0.0 - 0.3 mg/dL 0.1 0.1 -    Lab Results  Component Value Date/Time   TSH 2.71 10/31/2020 03:47 PM   TSH 1.89 02/27/2020 12:54 PM    CBC Latest Ref Rng & Units 10/31/2020 02/27/2020 02/07/2019  WBC 4.0 - 10.5 K/uL 7.8 7.3 9.1  Hemoglobin 12.0 - 15.0 g/dL 14.3 13.2 12.4  Hematocrit 36.0 - 46.0 % 43.0 39.7 37.3  Platelets 150.0  - 400.0 K/uL 212.0 210.0 251.0    Lab Results  Component Value Date/Time   VD25OH 49.43 02/27/2020 12:54 PM   VD25OH 32.38 11/01/2018 11:12 AM    Clinical ASCVD: No  The 10-year ASCVD risk score Mikey Bussing DC Jr., et al., 2013) is: 8.8%   Values used to calculate the score:     Age: 23 years     Sex: Female     Is Non-Hispanic African American: No     Diabetic: Yes     Tobacco smoker: No     Systolic Blood Pressure: 109 mmHg     Is BP treated: Yes     HDL Cholesterol: 50.5 mg/dL     Total Cholesterol: 233 mg/dL    Depression screen [facility] [place] 2/9 10/31/2020 02/27/2020 03/30/2019  Decreased Interest _0 Down, Depressed, Hopeless _1 PHQ - 2 Score _2 Altered sleeping 0 1 0  Tired, decreased energy _3 Change in appetite 0 1 0  Feeling bad or failure about yourself  _4 Trouble concentrating 0 1 0  Moving slowly or fidgety/restless 0 0 0  Suicidal thoughts 0 0 0  PHQ-9 Score _5 Difficult doing work/chores - - -  Some recent data might be hidden      Social History   Tobacco Use  Smoking Status Former   Pack years: 0.00   Types: Cigarettes  Smokeless Tobacco Never  Tobacco Comments   only smoked a couple of years   BP Readings from Last 3 Encounters:  02/22/21 137/83  12/17/20 140/80  11/09/20 (!) 141/83   Pulse Readings from Last 3 Encounters:  02/22/21 88  12/17/20 90  11/09/20 86   Wt Readings from Last 3 Encounters:  02/22/21 285 lb (129.3 kg)  01/09/21 287 lb (130.2 kg)  12/17/20 285 lb (129.3 kg)   BMI Readings from Last 3 Encounters:  02/22/21 45.31 kg/m  01/09/21 45.63 kg/m  12/17/20 45.31 kg/m    Assessment/Interventions: Review of patient past medical history, allergies, medications, health status, including review of consultants reports, laboratory and other test data, was performed as part of comprehensive evaluation and provision of chronic care management services.   SDOH:  (Social Determinants of Health) assessments and  interventions performed: No  SDOH Screenings   Alcohol Screen: Not on file  Depression (PHQ2-9): Medium Risk   PHQ-2 Score: 9  Financial Resource Strain: Not on file  Food Insecurity: Not on file  Housing: Not on file  Physical Activity: Not on file  Social Connections: Not on file  Stress: Not on file  Tobacco Use: Medium Risk   Smoking Tobacco Use: Former   Smokeless Tobacco Use: Never  Transportation Needs: No Transportation Needs   Lack of Transportation (Medical): No   Lack of Transportation (Non-Medical): No    CCM Care  Plan  Allergies  Allergen Reactions   Bupropion Other (See Comments)    Causes agitation   Duloxetine Other (See Comments)    Causes involuntary movements   Sulfa Antibiotics    Metformin And Related     vomiting   Paxil [Paroxetine Hcl]     Insomnia; manic   Prozac [Fluoxetine Hcl]     Hyperactive, insomnia   Trintellix [Vortioxetine]     Triggers fibromyalgia   Zoloft [Sertraline Hcl]     Hyperactivity, insomnia    Medications Reviewed Today     Reviewed by [name], LCSW (Social Worker) on 02/27/21 at 1653  Med List Status: <None>   Medication Order Taking? Sig Documenting Provider Last Dose Status Informant  Accu-Chek Softclix Lancets lancets [id number] No USE AS DIRECTED UP TO 4 TIMES A DAY Koberlein, Junell C, MD 02/22/2021 Active   amitriptyline (ELAVIL) 50 MG tablet [id number] No Take 1 tablet (50 mg total) by mouth at bedtime. [name], MD 02/21/2021 Active   bismuth subsalicylate (PEPTO BISMOL) 262 MG/15ML suspension [id number] No Take 30 mLs by mouth every 6 (six) hours as needed.  Patient not taking: Reported on 02/22/2021   [provider] Not Taking Active   blood glucose meter kit and supplies KIT [id number] No Dispense based on patient and insurance preference. Use up to four times daily as directed. (FOR ICD-9 250.00, 250.01). Caren Macadam, MD 02/22/2021 Active   Blood Pressure Monitoring (BLOOD PRESSURE  MONITOR/L CUFF) MISC [id number] No 1 Device by Does not apply route daily. Caren Macadam, MD 02/22/2021 Active   cetirizine (ZYRTEC) 10 MG tablet [id number] No TAKE 1 TABLET BY MOUTH EVERY DAY Koberlein, Junell C, MD 02/22/2021 Active   Cholecalciferol (VITAMIN D-3) 125 MCG (5000 UT) TABS [id number] No Take 1 tablet by mouth daily. [provider] Past Week Active   diazepam (VALIUM) 2 MG tablet [id number] No Take two to three tab as needed for anxiety [name], MD 02/22/2021 Active   diclofenac (VOLTAREN) 75 MG EC tablet [id number] No TAKE 1 TABLET (75 MG TOTAL) BY MOUTH 2 (TWO) TIMES DAILY AS NEEDED FOR MODERATE PAIN. Caren Macadam, MD 02/22/2021 Active   diclofenac Sodium (VOLTAREN) 1 % GEL [id number] No Apply topically as needed.  Patient not taking: Reported on 02/22/2021   [provider] Not Taking Active Self  Dulaglutide (TRULICITY Waves) [id number] No Inject into the skin. [provider] 02/21/2021 Active   famotidine (PEPCID) 40 MG tablet [id number] No Take 1 tablet (40 mg total) by mouth daily.  Patient not taking: Reported on 02/22/2021   Caren Macadam, MD Not Taking Active   Ferrous Sulfate (IRON) 325 (65 Fe) MG TABS [id number] No Take 1 tablet by mouth daily. [provider] 02/21/2021 Active Self  furosemide (LASIX) 20 MG tablet [id number] No TAKE 1 TABLET BY MOUTH EVERY DAY Koberlein, Junell C, MD Past Week Active   glucose blood (ACCU-CHEK AVIVA PLUS) test strip [id number] No USE AS DIRECTED UP TO 4 TIMES A DAY Koberlein, Junell C, MD 02/22/2021 Active   hydrocortisone-pramoxine (ANALPRAM HC) 2.5-1 % rectal cream [id number]  Use Analpram HC cream 2.5% ,apply externally BID for 7 days Mauri Pole, MD  Active   levothyroxine (SYNTHROID) 50 MCG tablet [id number] No TAKE 1 TABLET BY MOUTH EVERY DAY Koberlein, Junell C, MD 02/22/2021 Active   Lurasidone HCl 60 MG TABS [id number] No Take 1 tablet (60 mg total) by mouth daily with breakfast. Arfeen, Arlyce Harman,  MD 02/22/2021 Active   methocarbamol (ROBAXIN) 750 MG tablet [id number] No TAKE 1 TABLET (750 MG TOTAL) BY MOUTH EVERY 8 (EIGHT) HOURS AS NEEDED FOR MUSCLE SPASMS. Caren Macadam, MD 02/22/2021 Active   Multiple Vitamins-Minerals (MULTIVITAMIN ADULT PO) [id number] No Take 1 tablet by mouth daily. [provider] Past Week Active Self  omeprazole (PRILOSEC) 40 MG capsule [id number] No Take 1 capsule (40 mg total) by mouth daily.  Patient taking differently: Take 40 mg by mouth daily. Takes as needed in the pm   Caren Macadam, MD 02/22/2021 Active   pregabalin (LYRICA) 50 MG capsule [id number] No TAKE 1 CAPSULE BY MOUTH TWICE A DAY Koberlein, Junell C, MD 02/22/2021 Active   Semaglutide (OZEMPIC, 0.25 OR 0.5 MG/DOSE, [place]) [id number]  Inject 0.25 mg into the skin. [provider]  Active             Patient Active Problem List   Diagnosis Date Noted   Elevated d-dimer 11/16/2018   At risk for adverse drug reaction 11/10/2018   GERD (gastroesophageal reflux disease)    Hypertension    Diabetes mellitus type II, controlled ([place])    PTSD (post-traumatic stress disorder) 11/01/2018   Anxiety 11/01/2018   Depression, recurrent ([place]) 11/01/2018   Closed left ankle fracture 11/01/2018   Prediabetes 11/01/2018   Anemia 11/01/2018   Hypothyroid 11/01/2018   Fatty liver disease, nonalcoholic 24/40/1027   BMI 45.0-49.9, adult ([place]) 04/29/2018   DDD (degenerative disc disease), thoracolumbar 04/29/2018    Immunization History  Administered Date(s) Administered   Influenza,inj,Quad PF,6+ Mos 06/22/2018   Influenza-Unspecified 07/19/2020   Janssen (J&J) SARS-COV-2 Vaccination 01/25/2020, 07/19/2020   Patient's stomach woke her up on Sunday and it was burning and stinging. She took gas-x which provided some relief. She is feeling much better today and was able to finally pass gas. She also had a lot of nausea after the colonoscopy.  Patient's vision has worsened significantly.  Discussed how this could be from high blood sugars and to avoid getting new glasses/new prescription until they are better under control as this could be contributing.  Conditions to be addressed/monitored:  Hypertension, Hyperlipidemia, Diabetes, GERD, Depression, Anxiety and Allergic Rhinitis  Patient Care Plan: CCM Pharmacy Care Plan     Problem Identified: Problem: Hypertension, Hyperlipidemia, Diabetes, GERD, Depression, Anxiety and Allergic Rhinitis      Long-Range Goal: Patient-Specific Goal   Start Date: 02/20/2021  Expected End Date: 02/20/2022  Recent Progress: On track  Priority: High  Note:   Current Barriers:  Unable to achieve control of diabetes   Pharmacist Clinical Goal(s):  Patient will achieve control of diabetes as evidenced by A1c and home blood sugar readings  through collaboration with PharmD and provider.   Interventions: 1:1 collaboration with Caren Macadam, MD regarding development and update of comprehensive plan of care as evidenced by provider attestation and co-signature Inter-disciplinary care team collaboration (see longitudinal plan of care) Comprehensive medication review performed; medication list updated in electronic medical record  Hypertension (BP goal <130/80) -Not ideally controlled -Current treatment: Furosemide 20 mg 1 tablet daily -Medications previously tried: none  -Current home readings: did not provide -Current dietary habits: did not discuss -Current exercise habits: did not discuss -Denies hypotensive/hypertensive symptoms -Educated on Exercise goal of 150 minutes per week; Importance of home blood pressure monitoring; -Counseled to monitor BP at home weekly, document, and provide log at future appointments -Counseled on diet and exercise extensively Recommended to continue current medication  Hyperlipidemia: (LDL  goal < 100) -Uncontrolled -Current treatment: No medications -Medications previously tried: none   -Current dietary patterns: did not discuss -Current exercise habits: did not discuss -Educated on Cholesterol goals;  Benefits of statin for ASCVD risk reduction; Importance of limiting foods high in cholesterol; Exercise goal of 150 minutes per week; -Counseled on diet and exercise extensively  Diabetes (A1c goal <7%) -Uncontrolled  -Current medications: Tresiba 100 units/ml inject 17 units daily Ozempic inject 0.25 mg once weekly -Medications previously tried: none  -Current home glucose readings fasting glucose: 246, 213, 292, 266 post prandial glucose: 299, 239, 334, 328 -Denies hypoglycemic/hyperglycemic symptoms -Current meal patterns:  breakfast: slim fast lunch: did not discuss  dinner: [place] steak and mashed potatoes snacks: did not discuss drinks: sodas -Current exercise: did not discuss -Educated on A1c and blood sugar goals; Benefits of routine self-monitoring of blood sugar; Carbohydrate counting and/or plate method -Counseled to check feet daily and get yearly eye exams -Counseled on diet and exercise extensively Recommended increasing  Depression/Anxiety (Goal: minimize symptoms) -Not ideally controlled -Current treatment: Lurasidone 60 mg 1 tablet daily with breakfast Amitriptyline 50 mg 1 tablet daily at bedtime Diazepam 2 mg 2-3 tablets as needed for anxiety -Medications previously tried/failed: n/a -PHQ9: 9 -GAD7: n/a -Educated on Benefits of medication for symptom control Benefits of cognitive-behavioral therapy with or without medication -Recommended to continue current medication  GERD (Goal: minimize symptoms) -Controlled -Current treatment  Omeprazole 40 mg 1 capsule twice daily -Medications previously tried: Famotidine (side effects)  -Recommended to continue current medication Counseled on non-pharmacologic management of symptoms such as elevating the head of your bed, avoiding eating 2-3 hours before bed, avoiding triggering foods  such as acidic, spicy, or fatty foods, eating smaller meals, and wearing clothes that are loose around the waist  Hypothyroidism (Goal: TSH 0.35-4.5) -Controlled -Current treatment  Levothyroxine 50 mcg 1 tablet daily -Medications previously tried: none  -Recommended to continue current medication  Pain/muscle spasms (Goal: minimize pain) -Controlled -Current treatment  Pregabalin 50 mg 1 capsule twice daily Methocarbamol 750 mg 1 tablet every 8 hours as needed Diclofenac 75 mg 1 tablet twice daily as needed Diclofenac gel 1% apply topically as needed -Medications previously tried: n/a  -Recommended to continue current medication  Allergic rhinitis (Goal: minimize symptoms) -Controlled -Current treatment  Cetirizine 10 mg 1 tablet daily -Medications previously tried: none  -Recommended to continue current medication   Health Maintenance -Vaccine gaps: shingrix, COVID booster, Pneumovax -Current therapy:  Multivitamin 1 tablet daily Ferrous sulfate 325 mg 1 tablet daily Pepto bismol as needed -Educated on Cost vs benefit of each product must be carefully weighed by individual consumer -Patient is satisfied with current therapy and denies issues -Recommended to continue current medication  Patient Goals/Self-Care Activities Patient will:  - take medications as prescribed check glucose 3 times daily, document, and provide at future appointments engage in dietary modifications by decreasing carb intake  Follow Up Plan: Telephone follow up appointment with care management team member scheduled for: < 30 days      Medication Assistance: None required.  Patient affirms current coverage meets needs.  Compliance/Adherence/Medication fill history: Care Gaps: Shingrix, prevnar, covid booster, urine microalbumin, foot exam, pap smear, mammogram  Star-Rating Drugs: None  Patient's preferred pharmacy is:  [id number] [id number]: [phone]: [phone] Uses pill box? No - did not ask Pt endorses 90% compliance  We discussed: Current pharmacy is preferred with  insurance plan and patient is satisfied with pharmacy services Patient decided to: Continue current medication management strategy  Care Plan and Follow Up Patient Decision:  Patient agrees to Care Plan and Follow-up.  Plan: Telephone follow up appointment with care management team member scheduled for:  10 days  Jeni Salles, PharmD [facility]ist [place] [facility] [place] [phone]

## 2021-03-01 NOTE — Telephone Encounter (Signed)
Pt called stating that she wants the Elavil increased back up to "at least 75 mg" as she has not be able to sleep since decreasing this medication.please review and advise. Pt previously 0n 150 mg, then 75 mg, then mostly recently to 50 mg. Thanks.

## 2021-03-06 ENCOUNTER — Other Ambulatory Visit (HOSPITAL_COMMUNITY): Payer: Self-pay | Admitting: Psychiatry

## 2021-03-06 DIAGNOSIS — F419 Anxiety disorder, unspecified: Secondary | ICD-10-CM

## 2021-03-06 DIAGNOSIS — F4310 Post-traumatic stress disorder, unspecified: Secondary | ICD-10-CM

## 2021-03-08 ENCOUNTER — Encounter: Payer: Self-pay | Admitting: Family Medicine

## 2021-03-08 ENCOUNTER — Ambulatory Visit (INDEPENDENT_AMBULATORY_CARE_PROVIDER_SITE_OTHER): Payer: Medicare HMO | Admitting: Family Medicine

## 2021-03-08 ENCOUNTER — Other Ambulatory Visit: Payer: Self-pay

## 2021-03-08 VITALS — BP 118/76 | HR 86 | Temp 98.1°F | Ht 66.5 in | Wt 270.1 lb

## 2021-03-08 DIAGNOSIS — Z794 Long term (current) use of insulin: Secondary | ICD-10-CM

## 2021-03-08 DIAGNOSIS — F339 Major depressive disorder, recurrent, unspecified: Secondary | ICD-10-CM | POA: Diagnosis not present

## 2021-03-08 DIAGNOSIS — E039 Hypothyroidism, unspecified: Secondary | ICD-10-CM

## 2021-03-08 DIAGNOSIS — Z6841 Body Mass Index (BMI) 40.0 and over, adult: Secondary | ICD-10-CM | POA: Diagnosis not present

## 2021-03-08 DIAGNOSIS — I10 Essential (primary) hypertension: Secondary | ICD-10-CM | POA: Diagnosis not present

## 2021-03-08 DIAGNOSIS — K760 Fatty (change of) liver, not elsewhere classified: Secondary | ICD-10-CM

## 2021-03-08 DIAGNOSIS — E1165 Type 2 diabetes mellitus with hyperglycemia: Secondary | ICD-10-CM | POA: Diagnosis not present

## 2021-03-08 DIAGNOSIS — F419 Anxiety disorder, unspecified: Secondary | ICD-10-CM

## 2021-03-08 LAB — POCT GLYCOSYLATED HEMOGLOBIN (HGB A1C): Hemoglobin A1C: 10.2 % — AB (ref 4.0–5.6)

## 2021-03-08 NOTE — Patient Instructions (Addendum)
*  check vision plan for ophthalmologist (rather than optometrist)

## 2021-03-08 NOTE — Progress Notes (Signed)
Tiffany Reese DOB: 06/17/1961 Encounter date: 03/08/2021  This is a 60 y.o. female who presents with Chief Complaint  Patient presents with   Follow-up    History of present illness: She had started doing unhealthy things - like making sweet tea. She was feeling extremely tired, thirsty, chugging anything she could get a hold of. She checked sugar randomly and it was 400. That day all she had was slim fast and pimento cheese sandwich. She was really scared. Sugar was 173 fasting this morning; was 174 after eating cheese toast at 1:30. No issues with low sugars since starting the tresiba. She is up to 24 units at bedtime. She is doing ozempic -.73m as well now and tolerating this well. Highest sugar in this past week was 276 - on weds after eating clam chowder - over 2 hours later. (A1C from 10/31/20 was 7.6 and A1C 03/08/21 was 10.2)  Can get monitoring supplies [facility] free through [facility] She has ordered this through them and they stated approved and would send provider fax to complete. Confirmation #: [id number] Started buying atkins meals; they are pretty good.   She had significant discomfort with last colonoscopy - took a long time to get out gas.   Hypothyroid: continues to take synthroid  Allergies  Allergen Reactions   Bupropion Other (See Comments)    Causes agitation   Duloxetine Other (See Comments)    Causes involuntary movements   Sulfa Antibiotics    Metformin And Related     vomiting   Paxil [Paroxetine Hcl]     Insomnia; manic   Prozac [Fluoxetine Hcl]     Hyperactive, insomnia   Trintellix [Vortioxetine]     Triggers fibromyalgia   Zoloft [Sertraline Hcl]     Hyperactivity, insomnia   Current Meds  Medication Sig   Accu-Chek Softclix Lancets lancets USE AS DIRECTED UP TO 4 TIMES A DAY   bismuth subsalicylate (PEPTO BISMOL) 262 MG/15ML suspension Take 30 mLs by mouth every 6 (six) hours as needed.   blood glucose meter kit  and supplies KIT Dispense based on patient and insurance preference. Use up to four times daily as directed. (FOR ICD-9 250.00, 250.01).   Blood Pressure Monitoring (BLOOD PRESSURE MONITOR/L CUFF) MISC 1 Device by Does not apply route daily.   cetirizine (ZYRTEC) 10 MG tablet TAKE 1 TABLET BY MOUTH EVERY DAY   Cholecalciferol (VITAMIN D-3) 125 MCG (5000 UT) TABS Take 1 tablet by mouth daily.   diclofenac (VOLTAREN) 75 MG EC tablet TAKE 1 TABLET (75 MG TOTAL) BY MOUTH 2 (TWO) TIMES DAILY AS NEEDED FOR MODERATE PAIN.   diclofenac Sodium (VOLTAREN) 1 % GEL Apply topically as needed.   famotidine (PEPCID) 40 MG tablet Take 1 tablet (40 mg total) by mouth daily.   Ferrous Sulfate (IRON) 325 (65 Fe) MG TABS Take 1 tablet by mouth daily.   furosemide (LASIX) 20 MG tablet TAKE 1 TABLET BY MOUTH EVERY DAY   glucose blood (ACCU-CHEK AVIVA PLUS) test strip USE AS DIRECTED UP TO 4 TIMES A DAY   hydrocortisone-pramoxine (ANALPRAM HC) 2.5-1 % rectal cream Use Analpram HC cream 2.5% ,apply externally BID for 7 days   levothyroxine (SYNTHROID) 50 MCG tablet TAKE 1 TABLET BY MOUTH EVERY DAY   Lurasidone HCl 60 MG TABS Take 1 tablet (60 mg total) by mouth daily with breakfast. (Patient not taking: Reported on 03/11/2021)   methocarbamol (ROBAXIN) 750 MG tablet TAKE 1 TABLET (750 MG TOTAL) BY MOUTH  EVERY 8 (EIGHT) HOURS AS NEEDED FOR MUSCLE SPASMS.   Multiple Vitamins-Minerals (MULTIVITAMIN ADULT PO) Take 1 tablet by mouth daily.   omeprazole (PRILOSEC) 40 MG capsule Take 1 capsule (40 mg total) by mouth daily. (Patient taking differently: Take 40 mg by mouth daily. Takes as needed in the pm)   OZEMPIC, 0.25 OR 0.5 MG/DOSE, 2 MG/1.5ML SOPN INJECT 0.25MG ONCE A WEEK   pregabalin (LYRICA) 50 MG capsule TAKE 1 CAPSULE BY MOUTH TWICE A DAY   [DISCONTINUED] amitriptyline (ELAVIL) 50 MG tablet Take 1 tablet (50 mg total) by mouth at bedtime.   [DISCONTINUED] diazepam (VALIUM) 2 MG tablet Take two to three tab as needed for  anxiety    Review of Systems  Constitutional:  Positive for fatigue. Negative for chills and fever.  Respiratory:  Negative for cough, chest tightness, shortness of breath and wheezing.   Cardiovascular:  Negative for chest pain, palpitations and leg swelling.  Genitourinary:  Positive for frequency (improved as sugars are improving).  Musculoskeletal:  Positive for arthralgias.  Neurological:  Negative for dizziness.  Psychiatric/Behavioral:  Negative for suicidal ideas. The patient is nervous/anxious.    Objective:  BP 118/76 (BP Location: Left Arm, Patient Position: Sitting, Cuff Size: Large)   Pulse 86   Temp 98.1 F (36.7 C) (Oral)   Ht 5' 6.5" (1.689 m)   Wt 270 lb 1.6 oz (122.5 kg)   SpO2 95%   BMI 42.94 kg/m   Weight: 270 lb 1.6 oz (122.5 kg)   BP Readings from Last 3 Encounters:  03/08/21 118/76  02/22/21 137/83  12/17/20 140/80   Wt Readings from Last 3 Encounters:  03/08/21 270 lb 1.6 oz (122.5 kg)  02/22/21 285 lb (129.3 kg)  12/17/20 285 lb (129.3 kg)    Physical Exam Constitutional:      General: She is not in acute distress.    Appearance: She is well-developed.  Cardiovascular:     Rate and Rhythm: Normal rate and regular rhythm.     Heart sounds: Normal heart sounds. No murmur heard.   No friction rub.  Pulmonary:     Effort: Pulmonary effort is normal. No respiratory distress.     Breath sounds: Normal breath sounds. No wheezing or rales.  Musculoskeletal:     Right lower leg: No edema.     Left lower leg: No edema.  Neurological:     Mental Status: She is alert and oriented to person, place, and time.  Psychiatric:        Behavior: Behavior normal.    Assessment/Plan  1. Controlled type 2 diabetes mellitus with hyperglycemia, with long-term current use of insulin (HCC) Sugars are improving for her. Continue with tresiba titration up (goal of morning fasting 126 or less) and continue ozempic. Keep up great work with low carb diet. She has  lost weight and is feeling better already. We will keep close follow up on sugars/progress and adjust medications as needed.  - POC HgB A1c  2. Primary hypertension Well controlled. Currently not on medication. Would like to add ace/arb for renal protection, but I will wait to let her adjust to current medication regimen and we can discuss at next visit.   3. Fatty liver disease, nonalcoholic Elevated liver enzymes may affect medication management of her psychiatrist who is wanting to decrease amitriptyline dose at present. We discussed that with weight loss and healthier eating her liver enzymes may also improve. We will plan to recheck these in next month for  new baseline.  - Hepatic function panel; Future  4. Anxiety Following with psychiatry. Things have been very hard for her in last few years, but she is doing better overall with mood and is motivated to work on health. I do think that with feeling generally better overall health wise; this will help mood.   5. Depression, recurrent ([place]) See above. Currently managed by psychiatry.   6. Hypothyroidism, unspecified type Has been stable. Continue current dose of synthroid.   Return in about 3 months (around 06/08/2021) for bloodwork visit and then office visit week after.       Micheline Rough, MD

## 2021-03-11 ENCOUNTER — Encounter (HOSPITAL_COMMUNITY): Payer: Self-pay | Admitting: Psychiatry

## 2021-03-11 ENCOUNTER — Other Ambulatory Visit: Payer: Self-pay

## 2021-03-11 ENCOUNTER — Telehealth (INDEPENDENT_AMBULATORY_CARE_PROVIDER_SITE_OTHER): Payer: Medicare HMO | Admitting: Psychiatry

## 2021-03-11 VITALS — Wt 270.0 lb

## 2021-03-11 DIAGNOSIS — F319 Bipolar disorder, unspecified: Secondary | ICD-10-CM | POA: Diagnosis not present

## 2021-03-11 DIAGNOSIS — F4310 Post-traumatic stress disorder, unspecified: Secondary | ICD-10-CM

## 2021-03-11 DIAGNOSIS — F419 Anxiety disorder, unspecified: Secondary | ICD-10-CM

## 2021-03-11 MED ORDER — AMITRIPTYLINE HCL 50 MG PO TABS
50.0000 mg | ORAL_TABLET | Freq: Every day | ORAL | 0 refills | Status: DC
Start: 2021-03-11 — End: 2021-04-23

## 2021-03-11 MED ORDER — DIAZEPAM 2 MG PO TABS: ORAL_TABLET | ORAL | 1 refills | Status: DC

## 2021-03-11 MED ORDER — LURASIDONE HCL 40 MG PO TABS: 40.0000 mg | ORAL_TABLET | Freq: Every day | ORAL | 1 refills | Status: DC
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
## 2021-03-13 ENCOUNTER — Ambulatory Visit (INDEPENDENT_AMBULATORY_CARE_PROVIDER_SITE_OTHER): Payer: Medicare HMO | Admitting: Psychiatry

## 2021-03-13 ENCOUNTER — Other Ambulatory Visit: Payer: Self-pay | Admitting: Family Medicine

## 2021-03-13 ENCOUNTER — Telehealth: Payer: Self-pay | Admitting: Pharmacist

## 2021-03-13 DIAGNOSIS — F419 Anxiety disorder, unspecified: Secondary | ICD-10-CM | POA: Diagnosis not present

## 2021-03-13 DIAGNOSIS — F319 Bipolar disorder, unspecified: Secondary | ICD-10-CM | POA: Diagnosis not present

## 2021-03-13 MED ORDER — OZEMPIC (0.25 OR 0.5 MG/DOSE) 2 MG/1.5ML ~~LOC~~ SOPN: 0.5000 mg | PEN_INJECTOR | SUBCUTANEOUS | 2 refills | Status: DC

## 2021-03-13 NOTE — Telephone Encounter (Signed)
Patient called as she had a blood sugar reading of 104 one hour after eating 2 pieces of cheese toast. She did feel a little lightheaded which is likely due to how high her blood sugars were previously and explained this to the patient.  Patient reports fasting blood sugars of 143, 140, 130, 129 with taking Tresiba 30 units.   Communicated with PCP and she is good with the plan to decrease Tresiba to 20 units with increasing Ozempic on Sunday to 0.5 mg weekly. New prescription for Ozempic sent to [facility] per patient request as current pharmacy is on backorder.   Patient is aware of the plan and verbalized her understanding. Plan to follow up next week for CCM visit.

## 2021-03-14 ENCOUNTER — Other Ambulatory Visit: Payer: Self-pay

## 2021-03-14 ENCOUNTER — Ambulatory Visit (HOSPITAL_COMMUNITY): Payer: Medicare HMO | Admitting: Psychiatry

## 2021-03-15 ENCOUNTER — Other Ambulatory Visit: Payer: Self-pay | Admitting: Family Medicine

## 2021-03-15 ENCOUNTER — Encounter (HOSPITAL_COMMUNITY): Payer: Self-pay | Admitting: Psychiatry

## 2021-03-15 DIAGNOSIS — E119 Type 2 diabetes mellitus without complications: Secondary | ICD-10-CM

## 2021-03-15 DIAGNOSIS — Z794 Long term (current) use of insulin: Secondary | ICD-10-CM

## 2021-03-15 MED ORDER — BLOOD GLUCOSE MONITOR KIT: PACK | 0 refills | Status: DC

## 2021-03-15 NOTE — Progress Notes (Signed)
Virtual Visit via Video Note  I connected with Tiffany Reese on 03/15/21 at  4:30 PM EDT by a video enabled telemedicine application and verified that I am speaking with the correct person using two identifiers.  Location: Patient: Patient Home Provider: Home Office   I discussed the limitations of evaluation and management by telemedicine and the availability of in person appointments. The patient expressed understanding and agreed to proceed.   History of Present Illness: Bipolar 1, Anxiety and PTSD   Treatment Plan Goals: 1) Tiffany Reese would like to implement healthy habits, such as decrease sugar intake, walk daily, meal prep and plan grocery list to incorporated healthier purchases, in daily life to improve health, loose weight, and to feel more confident and less guilty.    2) Tiffany Reese would like to improve relationship with her daughter and granddaughter, through communicating with them daily or every other day, cleaning and organizing home so they are more comfortable visiting her and through monthly face to face interactions else where.   Observations/Objective: Counselor met with Client for individual therapy via Webex. Counselor assessed MH symptoms and progress on treatment plan goals, client stating that she has been more intentional with monitoring her blood sugar levels, having a positive affect on other daily functioning. Client reports losing 15 lbs, more energy and more motivation. Counselor and Client worked to update, revise and make additions to Client CCA and treatment plan. Client motivated to continue in treatment. Client reports being medication compliant. Client would like to keep current goals as core goals. Client in communication with specialist regarding specific health concerns. Client continues to decline group treatment due to dental issues. Client presents with mild depression and moderate anxiety. Client denied suicidal ideation or self-harm behaviors.     Assessment and Plan: Counselor will continue to meet with patient to address treatment plan goals. Patient will continue to follow recommendations of providers and implement skills learned in session.   Follow Up Instructions: Counselor will send information for next session via Webex.   The patient was advised to call back or seek an in-person evaluation if the symptoms worsen or if the condition fails to improve as anticipated.   I provided 45 minutes of non-face-to-face time during this encounter.     [name], LCSW

## 2021-03-19 ENCOUNTER — Other Ambulatory Visit: Payer: Self-pay | Admitting: *Deleted

## 2021-03-19 MED ORDER — TRUE METRIX METER W/DEVICE KIT: 1.0000 | PACK | Freq: Every day | 1 refills | Status: DC

## 2021-03-19 MED ORDER — TRUEPLUS LANCETS 33G MISC: 1 refills | Status: DC

## 2021-03-19 MED ORDER — TRUE METRIX BLOOD GLUCOSE TEST VI STRP: ORAL_STRIP | 1 refills | Status: DC

## 2021-03-19 NOTE — Telephone Encounter (Signed)
Patient request Rx for True Metrix Glucometer and supplies

## 2021-03-20 ENCOUNTER — Other Ambulatory Visit: Payer: Self-pay | Admitting: Family Medicine

## 2021-03-21 ENCOUNTER — Other Ambulatory Visit: Payer: Self-pay

## 2021-03-21 ENCOUNTER — Telehealth: Payer: Self-pay | Admitting: Pharmacist

## 2021-03-21 NOTE — Chronic Care Management (AMB) (Signed)
Date- Patient called to remind of appointment with Sheran Lawless on 07.01.2022 at 2:00 pm : Patient aware of appointment date, time, and type of appointment ( in person). Patient aware to have/bring all medications, supplements, blood pressure and/or blood sugar logs to visit.  Questions: Have you had any recent office visit or specialist visit outside of [facility] systems? No Are there any concerns you would like to discuss during your office visit? No Are you having any problems obtaining your medications? (Whether it pharmacy issues or cost) No  Star Rating Drug: None  Any gaps in medications fill history?  Berenice Bouton, CMA Clinical Pharmacist Assistant [phone]

## 2021-03-22 ENCOUNTER — Ambulatory Visit (INDEPENDENT_AMBULATORY_CARE_PROVIDER_SITE_OTHER): Payer: Medicare HMO | Admitting: Pharmacist

## 2021-03-22 ENCOUNTER — Other Ambulatory Visit (INDEPENDENT_AMBULATORY_CARE_PROVIDER_SITE_OTHER): Payer: Medicare HMO

## 2021-03-22 DIAGNOSIS — K760 Fatty (change of) liver, not elsewhere classified: Secondary | ICD-10-CM | POA: Diagnosis not present

## 2021-03-22 DIAGNOSIS — F339 Major depressive disorder, recurrent, unspecified: Secondary | ICD-10-CM | POA: Diagnosis not present

## 2021-03-22 DIAGNOSIS — I10 Essential (primary) hypertension: Secondary | ICD-10-CM | POA: Diagnosis not present

## 2021-03-22 DIAGNOSIS — E119 Type 2 diabetes mellitus without complications: Secondary | ICD-10-CM | POA: Diagnosis not present

## 2021-03-22 DIAGNOSIS — Z794 Long term (current) use of insulin: Secondary | ICD-10-CM

## 2021-03-22 NOTE — Progress Notes (Signed)
Chronic Care Management Pharmacy Note   Name:  Tiffany Reese MRN:  [id number] DOB:  [birth year]  Summary: FBGs are at goal 80-130 and post prandial BGs are low Patient requested a podiatrist referral  Recommendations/Changes made from today's visit: -Recommended decreasing Tresiba by 5 units to avoid low BGs during the day. -Recommend moderate intensity statin after resolution of elevated LFTs. -Recommended adding more carbs to breakfast as patient is having lower (<100) post prandial blood sugars.  Plan: Follow up in 3 weeks for DM assessment.  Subjective: Tiffany Reese is an 60 y.o. year old female who is a primary patient of Tiffany Reese, Tiffany Berg, MD.  The CCM team was consulted for assistance with disease management and care coordination needs.    Engaged with patient by telephone for follow up visit in response to provider referral for pharmacy case management and/or care coordination services.   Consent to Services:  The patient was given information about Chronic Care Management services, agreed to services, and gave verbal consent prior to initiation of services.  Please see initial visit note for detailed documentation.   Patient Care Team: Caren Macadam, MD as PCP - General (Family Medicine) Viona Gilmore, [facility] as Pharmacist (Pharmacist)  Recent office visits: 03/13/21 Telephone encounter: Recommended decreasing Tyler Aas to 20 units when increasing Ozempic to 0.5 mg weekly.  02/22/21 Micheline Rough, MD: Patient presented for diabetes follow up. A1c increased to 10.2%. Recommended increasing Tresiba to 30 units daily.  11/09/20 Caren Macadam, MD video visit: presented for follow up on lab results and type 2 diabetes. No med changes.  10/31/20 Caren Macadam, MD - presented for HTN follow-up. Started famotidine 40 mg Oral Daily. Patient stopped losartan due to low BP. Prescribed tramadol HCl 50 mg Oral Daily PRN.   Recent consult  visits: 03/13/21 [name], LCSW (behavioral health): Patient presented for counseling follow up.  03/11/21 Berniece Andreas, MD (behavioral health): Patient presented for PTSD and anxiety follow up. Decreased Latuda due to elevated liver enzymes.  02/28/21 Behavioral health telephone encounter: Recommended for patient to trial melatonin 5 mg as liver enzymes are still elevated.  02/27/21 [name], LCSW (behavioral health): Patient presented for counseling follow up.   02/22/21 Patient presented for colonoscopy.  02/13/21 [name], LCSW (behavioral health): Patient presented for counseling follow up.   01/09/21 Berniece Andreas, MD (behavioral health): Patient presented for PTSD and anxiety follow up. Plan to keep the Valium 2 mg and she can take up to 3 times a day to help her anxiety and nervousness.  12/17/20 Amy Easterwood, PA-C (gastro): Patient presented for evaluation of LFTs. Plan to repeat labs and for colonoscopy.  Hospital visits: None in previous 6 months   Objective:  Lab Results  Component Value Date   CREATININE 1.04 10/31/2020   BUN 18 10/31/2020   GFR 59.71 (L) 10/31/2020   GFRNONAA >60 11/05/2018   GFRAA >60 11/05/2018   NA 139 10/31/2020   K 4.1 10/31/2020   CALCIUM 10.3 10/31/2020   CO2 26 10/31/2020   GLUCOSE 171 (H) 10/31/2020    Lab Results  Component Value Date/Time   HGBA1C 10.2 (A) 03/08/2021 04:57 PM   HGBA1C 7.6 (H) 10/31/2020 03:47 PM   HGBA1C 6.3 02/27/2020 12:54 PM   GFR 59.71 (L) 10/31/2020 03:47 PM   GFR 62.99 02/27/2020 12:54 PM   MICROALBUR 1.1 02/27/2020 12:54 PM    Last diabetic Eye exam:  Lab Results  Component Value Date/Time   HMDIABEYEEXA  No Retinopathy 07/18/2019 12:00 AM    Last diabetic Foot exam: No results found for: HMDIABFOOTEX   Lab Results  Component Value Date   CHOL 233 (H) 10/31/2020   HDL 50.50 10/31/2020   LDLCALC 143 (H) 10/31/2020   TRIG 197.0 (H) 10/31/2020   CHOLHDL 5 10/31/2020    Hepatic Function  Latest Ref Rng & Units 03/22/2021 02/01/2021 12/17/2020  Total Protein 6.1 - 8.1 g/dL 7.7 8.3 8.3  Albumin 3.5 - 5.2 g/dL - 4.7 4.8  AST 10 - 35 U/L 76(H) 94(H) 90(H)  ALT 6 - 29 U/L 91(H) 125(H) 112(H)  Alk Phosphatase 39 - 117 U/L - 179(H) 149(H)  Total Bilirubin 0.2 - 1.2 mg/dL 0.5 0.6 0.3  Bilirubin, Direct 0.0 - 0.2 mg/dL 0.1 0.1 0.1    Lab Results  Component Value Date/Time   TSH 2.71 10/31/2020 03:47 PM   TSH 1.89 02/27/2020 12:54 PM    CBC Latest Ref Rng & Units 10/31/2020 02/27/2020 02/07/2019  WBC 4.0 - 10.5 K/uL 7.8 7.3 9.1  Hemoglobin 12.0 - 15.0 g/dL 14.3 13.2 12.4  Hematocrit 36.0 - 46.0 % 43.0 39.7 37.3  Platelets 150.0 - 400.0 K/uL 212.0 210.0 251.0    Lab Results  Component Value Date/Time   VD25OH 49.43 02/27/2020 12:54 PM   VD25OH 32.38 11/01/2018 11:12 AM    Clinical ASCVD: No  The 10-year ASCVD risk score Mikey Bussing DC Jr., et al., 2013) is: 6.6%   Values used to calculate the score:     Age: 60 years     Sex: Female     Is Non-Hispanic African American: No     Diabetic: Yes     Tobacco smoker: No     Systolic Blood Pressure: 476 mmHg     Is BP treated: Yes     HDL Cholesterol: 50.5 mg/dL     Total Cholesterol: 233 mg/dL    Depression screen [facility] 2/9 03/20/2021 03/12/2021 10/31/2020  Decreased Interest '1 1 2  ' Down, Depressed, Hopeless '1 1 1  ' PHQ - 2 Score '2 2 3  ' Altered sleeping 0 1 0  Tired, decreased energy '1 3 3  ' Change in appetite 1 0 0  Feeling bad or failure about yourself  '1 1 3  ' Trouble concentrating 1 0 0  Moving slowly or fidgety/restless 0 - 0  Suicidal thoughts 0 0 0  PHQ-9 Score '6 7 9  ' Difficult doing work/chores Somewhat difficult - -  Some recent data might be hidden      Social History   Tobacco Use  Smoking Status Former   Pack years: 0.00   Types: Cigarettes  Smokeless Tobacco Never  Tobacco Comments   only smoked a couple of years   BP Readings from Last 3 Encounters:  03/08/21 118/76  02/22/21 137/83  12/17/20 140/80    Pulse Readings from Last 3 Encounters:  03/08/21 86  02/22/21 88  12/17/20 90   Wt Readings from Last 3 Encounters:  03/11/21 270 lb (122.5 kg)  03/08/21 270 lb 1.6 oz (122.5 kg)  02/22/21 285 lb (129.3 kg)   BMI Readings from Last 3 Encounters:  03/11/21 42.93 kg/m  03/08/21 42.94 kg/m  02/22/21 45.31 kg/m    Assessment/Interventions: Review of patient past medical history, allergies, medications, health status, including review of consultants reports, laboratory and other test data, was performed as part of comprehensive evaluation and provision of chronic care management services.   SDOH:  (Social Determinants of Health) assessments and interventions performed: No  SDOH Screenings   Alcohol Screen: Not on file  Depression (PHQ2-9): Medium Risk   PHQ-2 Score: 6  Financial Resource Strain: Not on file  Food Insecurity: Not on file  Housing: Not on file  Physical Activity: Not on file  Social Connections: Not on file  Stress: Not on file  Tobacco Use: Medium Risk   Smoking Tobacco Use: Former   Smokeless Tobacco Use: Never  Transportation Needs: Not on file    [place]  Allergies  Allergen Reactions   Bupropion Other (See Comments)    Causes agitation   Duloxetine Other (See Comments)    Causes involuntary movements   Sulfa Antibiotics    Metformin And Related     vomiting   Paxil [Paroxetine Hcl]     Insomnia; manic   Prozac [Fluoxetine Hcl]     Hyperactive, insomnia   Trintellix [Vortioxetine]     Triggers fibromyalgia   Zoloft [Sertraline Hcl]     Hyperactivity, insomnia    Medications Reviewed Today     Reviewed by [name], LCSW (Social Worker) on 03/15/21 at 1316  Med List Status: <None>   Medication Order Taking? Sig Documenting Provider Last Dose Status Informant  Accu-Chek Softclix Lancets lancets [id number] No USE AS DIRECTED UP TO 4 TIMES A DAY Tiffany Reese, Junell C, MD Taking Active   amitriptyline (ELAVIL) 50 MG tablet  [id number]  Take 1 tablet (50 mg total) by mouth at bedtime. Arfeen, Arlyce Harman, MD  Active   bismuth subsalicylate (PEPTO BISMOL) 262 MG/15ML suspension [id number] No Take 30 mLs by mouth every 6 (six) hours as needed. [provider] Taking Active   blood glucose meter kit and supplies KIT [id number]  Dispense based on patient and insurance preference. Use up to four times daily as directed. Caren Macadam, MD  Active   Blood Pressure Monitoring (BLOOD PRESSURE MONITOR/L CUFF) MISC [id number] No 1 Device by Does not apply route daily. Caren Macadam, MD Taking Active   cetirizine (ZYRTEC) 10 MG tablet [id number] No TAKE 1 TABLET BY MOUTH EVERY DAY Tiffany Reese, Junell C, MD Taking Active   Cholecalciferol (VITAMIN D-3) 125 MCG (5000 UT) TABS [id number] No Take 1 tablet by mouth daily. [provider] Taking Active   diazepam (VALIUM) 2 MG tablet [id number]  Take two to three tab as needed for anxiety Arfeen, Arlyce Harman, MD  Active   diclofenac (VOLTAREN) 75 MG EC tablet [id number] No TAKE 1 TABLET (75 MG TOTAL) BY MOUTH 2 (TWO) TIMES DAILY AS NEEDED FOR MODERATE PAIN. Caren Macadam, MD Taking Active   diclofenac Sodium (VOLTAREN) 1 % GEL [id number] No Apply topically as needed. [provider] Taking Active   famotidine (PEPCID) 40 MG tablet [id number] No Take 1 tablet (40 mg total) by mouth daily. Caren Macadam, MD Taking Active   Ferrous Sulfate (IRON) 325 (65 Fe) MG TABS [id number] No Take 1 tablet by mouth daily. [provider] Taking Active Self  furosemide (LASIX) 20 MG tablet [id number] No TAKE 1 TABLET BY MOUTH EVERY DAY Tiffany Reese, Junell C, MD Taking Active   glucose blood (ACCU-CHEK AVIVA PLUS) test strip [id number] No USE AS DIRECTED UP TO 4 TIMES A DAY Tiffany Reese, Junell C, MD Taking Active   hydrocortisone-pramoxine (ANALPRAM HC) 2.5-1 % rectal cream [id number] No Use Analpram HC cream 2.5% ,apply externally BID for 7 days Mauri Pole, MD Taking  Active   levothyroxine (SYNTHROID) 50 MCG tablet [id number] No TAKE 1 TABLET BY MOUTH EVERY  DAY Caren Macadam, MD Taking Active   lurasidone (LATUDA) 40 MG TABS tablet [id number]  Take 1 tablet (40 mg total) by mouth daily with breakfast. Arfeen, Arlyce Harman, MD  Active   Lurasidone HCl 60 MG TABS [id number] No Take 1 tablet (60 mg total) by mouth daily with breakfast.  Patient not taking: Reported on 03/11/2021   [name], MD Not Taking Active   methocarbamol (ROBAXIN) 750 MG tablet [id number] No TAKE 1 TABLET (750 MG TOTAL) BY MOUTH EVERY 8 (EIGHT) HOURS AS NEEDED FOR MUSCLE SPASMS. Caren Macadam, MD Taking Active   Multiple Vitamins-Minerals (MULTIVITAMIN ADULT PO) [id number] No Take 1 tablet by mouth daily. [provider] Taking Active Self  omeprazole (PRILOSEC) 40 MG capsule [id number] No Take 1 capsule (40 mg total) by mouth daily.  Patient taking differently: Take 40 mg by mouth daily. Takes as needed in the pm   Tiffany Reese, Tiffany Berg, MD Taking Active   pregabalin (LYRICA) 50 MG capsule [id number] No TAKE 1 CAPSULE BY MOUTH TWICE A DAY Tiffany Reese, Junell C, MD Taking Active   Semaglutide,0.25 or 0.5MG/DOS, (OZEMPIC, 0.25 OR 0.5 MG/DOSE,) 2 MG/1.5ML SOPN [id number]  Inject 0.5 mg into the skin once a week. Caren Macadam, MD  Active             Patient Active Problem List   Diagnosis Date Noted   Elevated d-dimer 11/16/2018   At risk for adverse drug reaction 11/10/2018   GERD (gastroesophageal reflux disease)    Hypertension    Diabetes mellitus type II, controlled ([place])    PTSD (post-traumatic stress disorder) 11/01/2018   Anxiety 11/01/2018   Depression, recurrent ([place]) 11/01/2018   Closed left ankle fracture 11/01/2018   Anemia 11/01/2018   Hypothyroid 11/01/2018   Fatty liver disease, nonalcoholic 00/92/3300   BMI 45.0-49.9, adult ([place]) 04/29/2018   DDD (degenerative disc disease), thoracolumbar 04/29/2018    Immunization History  Administered  Date(s) Administered   Influenza,inj,Quad PF,6+ Mos 06/22/2018   Influenza-Unspecified 07/19/2020   Janssen (J&J) SARS-COV-2 Vaccination 01/25/2020, 07/19/2020   Patient is still having some stomach burning. Recommended trial of Gas-x again for further relief.  Conditions to be addressed/monitored:  Hypertension, Hyperlipidemia, Diabetes, GERD, Depression, Anxiety and Allergic Rhinitis  Patient Care Plan: CCM Pharmacy Care Plan     Problem Identified: Problem: Hypertension, Hyperlipidemia, Diabetes, GERD, Depression, Anxiety and Allergic Rhinitis      Long-Range Goal: Patient-Specific Goal   Start Date: 02/20/2021  Expected End Date: 02/20/2022  Recent Progress: On track  Priority: High  Note:   Current Barriers:  Unable to achieve control of diabetes   Pharmacist Clinical Goal(s):  Patient will achieve control of diabetes as evidenced by A1c and home blood sugar readings  through collaboration with PharmD and provider.   Interventions: 1:1 collaboration with Caren Macadam, MD regarding development and update of comprehensive plan of care as evidenced by provider attestation and co-signature Inter-disciplinary care team collaboration (see longitudinal plan of care) Comprehensive medication review performed; medication list updated in electronic medical record  Hypertension (BP goal <130/80) -Not ideally controlled -Current treatment: Furosemide 20 mg 1 tablet daily -Medications previously tried: none  -Current home readings: did not provide -Current dietary habits: did not discuss -Current exercise habits: did not discuss -Denies hypotensive/hypertensive symptoms -Educated on Exercise goal of 150 minutes per week; Importance of home blood pressure monitoring; -Counseled to monitor BP at home weekly, document, and provide log at future appointments -Counseled on diet and exercise  extensively Recommended to continue current medication  Hyperlipidemia: (LDL goal <  100) -Uncontrolled -Current treatment: No medications -Medications previously tried: none  -Current dietary patterns: did not discuss -Current exercise habits: did not discuss -Educated on Cholesterol goals;  Benefits of statin for ASCVD risk reduction; Importance of limiting foods high in cholesterol; Exercise goal of 150 minutes per week; -Counseled on diet and exercise extensively  Diabetes (A1c goal <7%) -Uncontrolled  -Current medications: Tresiba 100 units/ml inject 20 units daily Ozempic inject 0.5 mg once weekly -Medications previously tried: none  -Current home glucose readings fasting glucose: 122, 127, 121, 126, 110, 127 post prandial glucose: 104, 111, 94, 115, 96, 101, 115  -Reports hypoglycemic/hyperglycemic symptoms -Current meal patterns:  breakfast: slim fast lunch: did not discuss  dinner: [place] steak and mashed potatoes snacks: did not discuss drinks: no more soda -Current exercise: did not discuss -Educated on A1c and blood sugar goals; Prevention and management of hypoglycemic episodes; Benefits of routine self-monitoring of blood sugar; Carbohydrate counting and/or plate method -Counseled to check feet daily and get yearly eye exams -Counseled on diet and exercise extensively Recommended decreasing Tresiba by 5 units to avoid lows during the day.  Depression/Anxiety (Goal: minimize symptoms) -Not ideally controlled -Current treatment: Lurasidone 40 mg 1 tablet daily with breakfast Amitriptyline 50 mg 1 tablet daily at bedtime Diazepam 2 mg 2-3 tablets as needed for anxiety -Medications previously tried/failed: n/a -PHQ9: 9 -GAD7: n/a -Educated on Benefits of medication for symptom control Benefits of cognitive-behavioral therapy with or without medication -Recommended to continue current medication  GERD (Goal: minimize symptoms) -Controlled -Current treatment  Omeprazole 40 mg 1 capsule twice daily -Medications previously tried:  Famotidine (side effects)  -Recommended to continue current medication Counseled on non-pharmacologic management of symptoms such as elevating the head of your bed, avoiding eating 2-3 hours before bed, avoiding triggering foods such as acidic, spicy, or fatty foods, eating smaller meals, and wearing clothes that are loose around the waist  Hypothyroidism (Goal: TSH 0.35-4.5) -Controlled -Current treatment  Levothyroxine 50 mcg 1 tablet daily -Medications previously tried: none  -Recommended to continue current medication  Pain/muscle spasms (Goal: minimize pain) -Controlled -Current treatment  Pregabalin 50 mg 1 capsule twice daily Methocarbamol 750 mg 1 tablet every 8 hours as needed Diclofenac 75 mg 1 tablet twice daily as needed Diclofenac gel 1% apply topically as needed -Medications previously tried: n/a  -Recommended to continue current medication  Allergic rhinitis (Goal: minimize symptoms) -Controlled -Current treatment  Cetirizine 10 mg 1 tablet daily -Medications previously tried: none  -Recommended to continue current medication   Health Maintenance -Vaccine gaps: shingrix, COVID booster, Pneumovax -Current therapy:  Multivitamin 1 tablet daily Ferrous sulfate 325 mg 1 tablet daily Pepto bismol as needed -Educated on Cost vs benefit of each product must be carefully weighed by individual consumer -Patient is satisfied with current therapy and denies issues -Recommended to continue current medication  Patient Goals/Self-Care Activities Patient will:  - take medications as prescribed check glucose 3 times daily, document, and provide at future appointments engage in dietary modifications by decreasing carb intake  Follow Up Plan: Telephone follow up appointment with care management team member scheduled for: < 30 days        Medication Assistance: None required.  Patient affirms current coverage meets needs.  Compliance/Adherence/Medication fill  history: Care Gaps: Shingrix, prevnar, covid booster, urine microalbumin, foot exam, pap smear, mammogram  Star-Rating Drugs: None  Patient's preferred pharmacy is:  [id number]- [place], [place] -  [address] [place]  [address] Phone: [phone] Fax: [phone]  [facility] [place], [place] - [address] AT [address] [place] [address] Phone: [phone] Fax: [phone]  [facility] - [place], [address] [place] [address] Phone: [phone] Fax: [phone]  Uses pill box? No - did not ask Pt endorses 90% compliance  We discussed: Current pharmacy is preferred with insurance plan and patient is satisfied with pharmacy services Patient decided to: Continue current medication management strategy  Care Plan and Follow Up Patient Decision:  Patient agrees to Care Plan and Follow-up.  Plan: Telephone follow up appointment with care management team member scheduled for:  10 days  Jeni Salles, PharmD [place] Pharmacist [place] [facility] [place] [phone]

## 2021-03-22 NOTE — Addendum Note (Signed)
Addended by: Kandra Nicolas on: 03/22/2021 02:46 PM   Modules accepted: Orders

## 2021-03-23 LAB — HEPATIC FUNCTION PANEL
AG Ratio: 1.8 (calc) (ref 1.0–2.5)
ALT: 91 U/L — ABNORMAL HIGH (ref 6–29)
AST: 76 U/L — ABNORMAL HIGH (ref 10–35)
Albumin: 4.9 g/dL (ref 3.6–5.1)
Alkaline phosphatase (APISO): 174 U/L — ABNORMAL HIGH (ref 37–153)
Bilirubin, Direct: 0.1 mg/dL (ref 0.0–0.2)
Globulin: 2.8 g/dL (calc) (ref 1.9–3.7)
Indirect Bilirubin: 0.4 mg/dL (calc) (ref 0.2–1.2)
Total Bilirubin: 0.5 mg/dL (ref 0.2–1.2)
Total Protein: 7.7 g/dL (ref 6.1–8.1)

## 2021-03-27 ENCOUNTER — Telehealth: Payer: Self-pay | Admitting: Pharmacist

## 2021-03-27 ENCOUNTER — Other Ambulatory Visit: Payer: Self-pay | Admitting: Family Medicine

## 2021-03-27 DIAGNOSIS — E118 Type 2 diabetes mellitus with unspecified complications: Secondary | ICD-10-CM

## 2021-03-27 NOTE — Telephone Encounter (Addendum)
Called patient to discuss plan from CCM visit regarding insulin after discussion with PCP. Recommended for patient to decrease Tresiba to 10 units daily.   Scheduled follow up call in 2 weeks. Patient is aware to call back if she is having any lows or has any questions or concerns with the plan. Plan to stop insulin at that time if BGs are still running on the lower side.

## 2021-04-01 ENCOUNTER — Telehealth: Payer: Self-pay | Admitting: Family Medicine

## 2021-04-01 NOTE — Telephone Encounter (Signed)
Left message for patient to call back and schedule Medicare Annual Wellness Visit (AWV) either virtually or in office.    AWV-I per [place] 04/22/18  please schedule at anytime with [facility] Nurse Health Advisor 1 or 2   This should be a 45 minute visit. 

## 2021-04-03 ENCOUNTER — Encounter (INDEPENDENT_AMBULATORY_CARE_PROVIDER_SITE_OTHER): Payer: Self-pay

## 2021-04-03 ENCOUNTER — Encounter: Payer: Self-pay | Admitting: Podiatry

## 2021-04-03 ENCOUNTER — Other Ambulatory Visit: Payer: Self-pay

## 2021-04-03 ENCOUNTER — Ambulatory Visit (INDEPENDENT_AMBULATORY_CARE_PROVIDER_SITE_OTHER): Payer: Medicare HMO | Admitting: Podiatry

## 2021-04-03 DIAGNOSIS — M79675 Pain in left toe(s): Secondary | ICD-10-CM | POA: Diagnosis not present

## 2021-04-03 DIAGNOSIS — M21611 Bunion of right foot: Secondary | ICD-10-CM

## 2021-04-03 DIAGNOSIS — L600 Ingrowing nail: Secondary | ICD-10-CM

## 2021-04-03 DIAGNOSIS — M79674 Pain in right toe(s): Secondary | ICD-10-CM

## 2021-04-03 DIAGNOSIS — B351 Tinea unguium: Secondary | ICD-10-CM | POA: Diagnosis not present

## 2021-04-03 DIAGNOSIS — M21612 Bunion of left foot: Secondary | ICD-10-CM

## 2021-04-03 NOTE — Patient Instructions (Signed)

## 2021-04-04 NOTE — Progress Notes (Signed)
Subjective:   Patient ID: Tiffany Reese, female   DOB: 60 y.o.   MRN: [id number]   HPI Patient presents stating she is got a chronic ingrown toenail of her right big toe she knows she has bunion deformity bilateral and nail disease 1-5 both feet that she cannot take care of and get thick and make shoe gear difficult.  Patient does not have any other significant plaints   Review of Systems  All other systems reviewed and are negative.      Objective:  Physical Exam Vitals and nursing note reviewed.  Constitutional:      Appearance: She is well-developed.  Pulmonary:     Effort: Pulmonary effort is normal.  Musculoskeletal:        General: Normal range of motion.  Skin:    General: Skin is warm.  Neurological:     Mental Status: She is alert.    Neurovascular status intact muscle strength was found to be within normal limits with patient found to have an incurvated right hallux nail medial border structural bunion rotation of the toe and thickened nailbeds 1-5 both feet that can become painful with shoe gear and are possible for her to cut     Assessment:  On an ingrown toenail deformity right hallux structural bunion hammertoe and mycotic nail infection 1-5 both feet     Plan:  NP reviewed condition and educated her on this recommended correction of nail and had her sign consent form.  I infiltrated the right hallux 60 mg like Marcaine mixture sterile prep done removed the medial border exposed matrix applied phenol 3 applications 30 seconds followed by alcohol by sterile dressing gave instructions on soaks and went ahead debrided remaining nailbed and nail structure and reappoint to recheck

## 2021-04-08 ENCOUNTER — Encounter (HOSPITAL_COMMUNITY): Payer: Self-pay | Admitting: Psychiatry

## 2021-04-08 ENCOUNTER — Ambulatory Visit (INDEPENDENT_AMBULATORY_CARE_PROVIDER_SITE_OTHER): Payer: Medicare HMO | Admitting: Psychiatry

## 2021-04-08 ENCOUNTER — Other Ambulatory Visit (HOSPITAL_COMMUNITY): Payer: Self-pay | Admitting: Psychiatry

## 2021-04-08 DIAGNOSIS — F419 Anxiety disorder, unspecified: Secondary | ICD-10-CM

## 2021-04-08 DIAGNOSIS — F319 Bipolar disorder, unspecified: Secondary | ICD-10-CM | POA: Diagnosis not present

## 2021-04-08 DIAGNOSIS — F4310 Post-traumatic stress disorder, unspecified: Secondary | ICD-10-CM

## 2021-04-08 NOTE — Progress Notes (Signed)
Virtual Visit via Video Note  I connected with Tiffany Reese on 04/08/21 at  3:30 PM EDT by a video enabled telemedicine application and verified that I am speaking with the correct person using two identifiers.  Location: Patient: Patient Home Provider: Home Office   I discussed the limitations of evaluation and management by telemedicine and the availability of in person appointments. The patient expressed understanding and agreed to proceed.   History of Present Illness: Bipolar 1, Anxiety and PTSD   Treatment Plan Goals: 1) Tiffany Reese would like to implement healthy habits, such as decrease sugar intake, walk daily, meal prep and plan grocery list to incorporated healthier purchases, in daily life to improve health, loose weight, and to feel more confident and less guilty.    2) Tiffany Reese would like to improve relationship with her daughter and granddaughter, through communicating with them daily or every other day, cleaning and organizing home so they are more comfortable visiting her and through monthly face to face interactions else where.   Observations/Objective: Counselor met with Client for individual therapy via Webex. Counselor assessed MH symptoms and progress on treatment plan goals, client stating that she is very anxious and upset today over the upcoming vet appointment for her pet dog. Client presents with mild depression and moderate anxiety. Client denied suicidal ideation or self-harm behaviors. Client reports being behavorial health medication compliant 7 out of 7 days per week.    Goal 1: Counselor assessed implementation of daily health habits of managing diabetes, walking daily, meal prepping and making a grocery list for her weekly shopping trip. Client reports losing 21 lbs since May 2022, having more energy and following recommended diet. Client discussed side effects related to insulin and her plan to report to dr on Friday. Client reports starting to feel changes in  her body, and the continuation of guilt for "letting herself go like this." Counselor prompted Client to stay in the here and now, focusing on what she can control now and the concept of become the best version of her current self, vs recreating a former presentation of self.   Goal 2: Counselor assessed frequency of contact with daughter and process current relationship concerns at this time using CBT and MI interventions. Client reports that she has not seen daughter in over 6 weeks, when her daughter stated she is ashamed of the condition of her mother. Counselor and Client processed feelings and thoughts associated with statement, identifying root and underlying meanings behid the statement. Client reports not hearing from daughter in over 3 weeks via phone, despite Client's desire to celebrate her granddaughters recent birthday with a gift and shared time. Client became upset and hurt by these actions. Counselor and Client discussed ways she can communicate feelings with daughter and addressed safety concerns of living alone with health issues. Counselor prompted Client to look into a life alert system with insurance provider. Client to communicate concerns with daughter at next interaction.    Assessment and Plan: Counselor will continue to meet with patient to address treatment plan goals. Patient will continue to follow recommendations of providers and implement skills learned in session.   Follow Up Instructions: Counselor will send information for next session via Webex.   The patient was advised to call back or seek an in-person evaluation if the symptoms worsen or if the condition fails to improve as anticipated.   I provided 60 minutes of non-face-to-face time during this encounter.     [name], LCSW

## 2021-04-11 ENCOUNTER — Other Ambulatory Visit: Payer: Self-pay | Admitting: Family Medicine

## 2021-04-11 DIAGNOSIS — M25572 Pain in left ankle and joints of left foot: Secondary | ICD-10-CM

## 2021-04-12 ENCOUNTER — Ambulatory Visit: Payer: Medicare HMO | Admitting: Pharmacist

## 2021-04-12 DIAGNOSIS — F339 Major depressive disorder, recurrent, unspecified: Secondary | ICD-10-CM

## 2021-04-12 DIAGNOSIS — E119 Type 2 diabetes mellitus without complications: Secondary | ICD-10-CM

## 2021-04-12 DIAGNOSIS — Z794 Long term (current) use of insulin: Secondary | ICD-10-CM

## 2021-04-12 NOTE — Progress Notes (Signed)
Chronic Care Management Pharmacy Note   Name:  Tiffany Reese MRN:  [id number] DOB:  1961-05-27  Summary: FBGs are at goal 80-130 and post prandial BGs are at goal < 180  Recommendations/Changes made from today's visit: -Recommended continuing to hold Tresiba -Recommend moderate intensity statin after resolution of elevated LFTs. -Recommended adding more carbs to breakfast as patient is having lower (<100) post prandial blood sugars.  Plan: Follow up in 4 weeks for DM assessment.  Subjective: Tiffany Reese is an 60 y.o. year old female who is a primary patient of Tiffany, Steele Berg, MD.  The CCM team was consulted for assistance with disease management and care coordination needs.    Engaged with patient by telephone for follow up visit in response to provider referral for pharmacy case management and/or care coordination services.   Consent to Services:  The patient was given information about Chronic Care Management services, agreed to services, and gave verbal consent prior to initiation of services.  Please see initial visit note for detailed documentation.   Patient Care Team: Caren Macadam, MD as PCP - General (Family Medicine) Viona Gilmore, [facility] as Pharmacist (Pharmacist)  Recent office visits: 03/27/21 Telephone encounter: Recommended decreasing Tyler Aas to 10 units and can decrease further depending on AM blood sugars.  03/13/21 Telephone encounter: Recommended decreasing Tyler Aas to 20 units when increasing Ozempic to 0.5 mg weekly.  02/22/21 Micheline Rough, MD: Patient presented for diabetes follow up. A1c increased to 10.2%. Recommended increasing Tresiba to 30 units daily.  11/09/20 Caren Macadam, MD video visit: presented for follow up on lab results and type 2 diabetes. No med changes.  10/31/20 Caren Macadam, MD - presented for HTN follow-up. Started famotidine 40 mg Oral Daily. Patient stopped losartan due to low BP. Prescribed  tramadol HCl 50 mg Oral Daily PRN.  Recent consult visits: 04/08/21 [name], LCSW (behavioral health): Patient presented for counseling follow up.  04/03/21 Ila Mcgill, DPM: Patient presented for ingrown toenail removal.  03/13/21 [name], LCSW (behavioral health): Patient presented for counseling follow up.  03/11/21 Berniece Andreas, MD (behavioral health): Patient presented for PTSD and anxiety follow up. Decreased Latuda due to elevated liver enzymes.  02/28/21 Behavioral health telephone encounter: Recommended for patient to trial melatonin 5 mg as liver enzymes are still elevated.  02/27/21 [name], LCSW (behavioral health): Patient presented for counseling follow up.   02/22/21 Patient presented for colonoscopy.  02/13/21 [name], LCSW (behavioral health): Patient presented for counseling follow up.   01/09/21 Berniece Andreas, MD (behavioral health): Patient presented for PTSD and anxiety follow up. Plan to keep the Valium 2 mg and she can take up to 3 times a day to help her anxiety and nervousness.  12/17/20 Amy Easterwood, PA-C (gastro): Patient presented for evaluation of LFTs. Plan to repeat labs and for colonoscopy.  Hospital visits: None in previous 6 months   Objective:  Lab Results  Component Value Date   CREATININE 1.04 10/31/2020   BUN 18 10/31/2020   GFR 59.71 (L) 10/31/2020   GFRNONAA >60 11/05/2018   GFRAA >60 11/05/2018   NA 139 10/31/2020   K 4.1 10/31/2020   CALCIUM 10.3 10/31/2020   CO2 26 10/31/2020   GLUCOSE 171 (H) 10/31/2020    Lab Results  Component Value Date/Time   HGBA1C 10.2 (A) 03/08/2021 04:57 PM   HGBA1C 7.6 (H) 10/31/2020 03:47 PM   HGBA1C 6.3 02/27/2020 12:54 PM   GFR 59.71 (L) 10/31/2020 03:47  PM   GFR 62.99 02/27/2020 12:54 PM   MICROALBUR 1.1 02/27/2020 12:54 PM    Last diabetic Eye exam:  Lab Results  Component Value Date/Time   HMDIABEYEEXA No Retinopathy 07/18/2019 12:00 AM    Last diabetic Foot exam: No  results found for: HMDIABFOOTEX   Lab Results  Component Value Date   CHOL 233 (H) 10/31/2020   HDL 50.50 10/31/2020   LDLCALC 143 (H) 10/31/2020   TRIG 197.0 (H) 10/31/2020   CHOLHDL 5 10/31/2020    Hepatic Function Latest Ref Rng & Units 03/22/2021 02/01/2021 12/17/2020  Total Protein 6.1 - 8.1 g/dL 7.7 8.3 8.3  Albumin 3.5 - 5.2 g/dL - 4.7 4.8  AST 10 - 35 U/L 76(H) 94(H) 90(H)  ALT 6 - 29 U/L 91(H) 125(H) 112(H)  Alk Phosphatase 39 - 117 U/L - 179(H) 149(H)  Total Bilirubin 0.2 - 1.2 mg/dL 0.5 0.6 0.3  Bilirubin, Direct 0.0 - 0.2 mg/dL 0.1 0.1 0.1    Lab Results  Component Value Date/Time   TSH 2.71 10/31/2020 03:47 PM   TSH 1.89 02/27/2020 12:54 PM    CBC Latest Ref Rng & Units 10/31/2020 02/27/2020 02/07/2019  WBC 4.0 - 10.5 K/uL 7.8 7.3 9.1  Hemoglobin 12.0 - 15.0 g/dL 14.3 13.2 12.4  Hematocrit 36.0 - 46.0 % 43.0 39.7 37.3  Platelets 150.0 - 400.0 K/uL 212.0 210.0 251.0    Lab Results  Component Value Date/Time   VD25OH 49.43 02/27/2020 12:54 PM   VD25OH 32.38 11/01/2018 11:12 AM    Clinical ASCVD: No  The 10-year ASCVD risk score Mikey Bussing DC Jr., et al., 2013) is: 6.6%   Values used to calculate the score:     Age: 60 years     Sex: Female     Is Non-Hispanic African American: No     Diabetic: Yes     Tobacco smoker: No     Systolic Blood Pressure: 561 mmHg     Is BP treated: Yes     HDL Cholesterol: 50.5 mg/dL     Total Cholesterol: 233 mg/dL    Depression screen [facility] 2/9 03/20/2021 03/12/2021 10/31/2020  Decreased Interest '1 1 2  ' Down, Depressed, Hopeless '1 1 1  ' PHQ - 2 Score '2 2 3  ' Altered sleeping 0 1 0  Tired, decreased energy '1 3 3  ' Change in appetite 1 0 0  Feeling bad or failure about yourself  '1 1 3  ' Trouble concentrating 1 0 0  Moving slowly or fidgety/restless 0 - 0  Suicidal thoughts 0 0 0  PHQ-9 Score '6 7 9  ' Difficult doing work/chores Somewhat difficult - -  Some recent data might be hidden      Social History   Tobacco Use  Smoking  Status Former   Types: Cigarettes  Smokeless Tobacco Never  Tobacco Comments   only smoked a couple of years   BP Readings from Last 3 Encounters:  03/08/21 118/76  02/22/21 137/83  12/17/20 140/80   Pulse Readings from Last 3 Encounters:  03/08/21 86  02/22/21 88  12/17/20 90   Wt Readings from Last 3 Encounters:  03/11/21 270 lb (122.5 kg)  03/08/21 270 lb 1.6 oz (122.5 kg)  02/22/21 285 lb (129.3 kg)   BMI Readings from Last 3 Encounters:  03/11/21 42.93 kg/m  03/08/21 42.94 kg/m  02/22/21 45.31 kg/m    Assessment/Interventions: Review of patient past medical history, allergies, medications, health status, including review of consultants reports, laboratory and other test data,  was performed as part of comprehensive evaluation and provision of chronic care management services.   SDOH:  (Social Determinants of Health) assessments and interventions performed: No  SDOH Screenings   Alcohol Screen: Not on file  Depression (PHQ2-9): Medium Risk   PHQ-2 Score: 6  Financial Resource Strain: Not on file  Food Insecurity: Not on file  Housing: Not on file  Physical Activity: Not on file  Social Connections: Not on file  Stress: Not on file  Tobacco Use: Medium Risk   Smoking Tobacco Use: Former   Smokeless Tobacco Use: Never  Transportation Needs: Not on file    [place]  Allergies  Allergen Reactions   Bupropion Other (See Comments)    Causes agitation   Duloxetine Other (See Comments)    Causes involuntary movements   Sulfa Antibiotics    Metformin And Related     vomiting   Paxil [Paroxetine Hcl]     Insomnia; manic   Prozac [Fluoxetine Hcl]     Hyperactive, insomnia   Trintellix [Vortioxetine]     Triggers fibromyalgia   Zoloft [Sertraline Hcl]     Hyperactivity, insomnia    Medications Reviewed Today     Reviewed by [name], LCSW (Social Worker) on 04/08/21 at 2326  Med List Status: <None>   Medication Order Taking? Sig  Documenting Provider Last Dose Status Informant  amitriptyline (ELAVIL) 50 MG tablet [id number]  Take 1 tablet (50 mg total) by mouth at bedtime. Arfeen, Arlyce Harman, MD  Active   bismuth subsalicylate (PEPTO BISMOL) 262 MG/15ML suspension [id number] No Take 30 mLs by mouth every 6 (six) hours as needed. [provider] Taking Active   Blood Glucose Monitoring Suppl (TRUE METRIX METER) w/Device KIT [id number]  1 each by Does not apply route daily. Caren Macadam, MD  Active   Blood Pressure Monitoring (BLOOD PRESSURE MONITOR/L CUFF) MISC [id number] No 1 Device by Does not apply route daily. Caren Macadam, MD Taking Active   cetirizine (ZYRTEC) 10 MG tablet [id number] No TAKE 1 TABLET BY MOUTH EVERY DAY Tiffany, Junell C, MD Taking Active   Cholecalciferol (VITAMIN D-3) 125 MCG (5000 UT) TABS [id number] No Take 1 tablet by mouth daily. [provider] Taking Active   diazepam (VALIUM) 2 MG tablet [id number]  Take two to three tab as needed for anxiety Arfeen, Arlyce Harman, MD  Active   diclofenac (VOLTAREN) 75 MG EC tablet [id number] No TAKE 1 TABLET (75 MG TOTAL) BY MOUTH 2 (TWO) TIMES DAILY AS NEEDED FOR MODERATE PAIN. Caren Macadam, MD Taking Active   diclofenac Sodium (VOLTAREN) 1 % GEL [id number] No Apply topically as needed. [provider] Taking Active   famotidine (PEPCID) 40 MG tablet [id number] No Take 1 tablet (40 mg total) by mouth daily. Caren Macadam, MD Taking Active   Ferrous Sulfate (IRON) 325 (65 Fe) MG TABS [id number] No Take 1 tablet by mouth daily. [provider] Taking Active Self  furosemide (LASIX) 20 MG tablet [id number]  TAKE 1 TABLET BY MOUTH EVERY DAY Tiffany, Junell C, MD  Active   glucose blood (TRUE METRIX BLOOD GLUCOSE TEST) test strip [id number]  USE AS DIRECTED UP TO 4 TIMES A DAY Tiffany, Junell C, MD  Active   hydrocortisone-pramoxine (ANALPRAM HC) 2.5-1 % rectal cream [id number] No Use Analpram HC cream 2.5% ,apply externally  BID for 7 days Mauri Pole, MD Taking Active   levothyroxine (SYNTHROID) 50 MCG tablet [id number] No TAKE 1 TABLET BY  MOUTH EVERY DAY Tiffany, Steele Berg, MD Taking Active   lurasidone (LATUDA) 40 MG TABS tablet [id number] No Take 1 tablet (40 mg total) by mouth daily with breakfast. Arfeen, Arlyce Harman, MD Taking Active   Lurasidone HCl 60 MG TABS [id number] No Take 1 tablet (60 mg total) by mouth daily with breakfast.  Patient not taking: Reported on 03/11/2021   [name], MD Not Taking Active   methocarbamol (ROBAXIN) 750 MG tablet [id number] No TAKE 1 TABLET (750 MG TOTAL) BY MOUTH EVERY 8 (EIGHT) HOURS AS NEEDED FOR MUSCLE SPASMS. Caren Macadam, MD Taking Active   Multiple Vitamins-Minerals (MULTIVITAMIN ADULT PO) [id number] No Take 1 tablet by mouth daily. [provider] Taking Active Self  omeprazole (PRILOSEC) 40 MG capsule [id number] No Take 1 capsule (40 mg total) by mouth daily.  Patient taking differently: Take 40 mg by mouth daily. Takes as needed in the pm   Tiffany, Steele Berg, MD Taking Active   pregabalin (LYRICA) 50 MG capsule [id number] No TAKE 1 CAPSULE BY MOUTH TWICE A DAY Tiffany, Junell C, MD Taking Active   Semaglutide,0.25 or 0.5MG/DOS, (OZEMPIC, 0.25 OR 0.5 MG/DOSE,) 2 MG/1.5ML SOPN [id number]  Inject 0.5 mg into the skin once a week. Caren Macadam, MD  Active   TRUEplus Lancets 33G MISC [id number]  USE AS DIRECTED UP TO 4 TIMES A DAY Caren Macadam, MD  Active             Patient Active Problem List   Diagnosis Date Noted   Elevated d-dimer 11/16/2018   At risk for adverse drug reaction 11/10/2018   GERD (gastroesophageal reflux disease)    Hypertension    Diabetes mellitus type II, controlled ([place])    PTSD (post-traumatic stress disorder) 11/01/2018   Anxiety 11/01/2018   Depression, recurrent ([place]) 11/01/2018   Closed left ankle fracture 11/01/2018   Anemia 11/01/2018   Hypothyroid 11/01/2018   Fatty liver disease,  nonalcoholic 07/24/1116   BMI 45.0-49.9, adult ([place]) 04/29/2018   DDD (degenerative disc disease), thoracolumbar 04/29/2018    Immunization History  Administered Date(s) Administered   Influenza,inj,Quad PF,6+ Mos 06/22/2018   Influenza-Unspecified 07/19/2020   Janssen (J&J) SARS-COV-2 Vaccination 01/25/2020, 07/19/2020   Patient has been more upset lately as her 12 year old dog, Mimi, has an ear infection. Her eyes have been darting which is not a good sign.   Conditions to be addressed/monitored:  Hypertension, Hyperlipidemia, Diabetes, GERD, Depression, Anxiety and Allergic Rhinitis  Conditions addressed this visit: Diabetes, Depression  Patient Care Plan: CCM Pharmacy Care Plan     Problem Identified: Problem: Hypertension, Hyperlipidemia, Diabetes, GERD, Depression, Anxiety and Allergic Rhinitis      Long-Range Goal: Patient-Specific Goal   Start Date: 02/20/2021  Expected End Date: 02/20/2022  Recent Progress: On track  Priority: High  Note:   Current Barriers:  Unable to achieve control of diabetes   Pharmacist Clinical Goal(s):  Patient will achieve control of diabetes as evidenced by A1c and home blood sugar readings  through collaboration with PharmD and provider.   Interventions: 1:1 collaboration with Caren Macadam, MD regarding development and update of comprehensive plan of care as evidenced by provider attestation and co-signature Inter-disciplinary care team collaboration (see longitudinal plan of care) Comprehensive medication review performed; medication list updated in electronic medical record  Hypertension (BP goal <130/80) -Not ideally controlled -Current treatment: Furosemide 20 mg 1 tablet daily -Medications previously tried: none  -Current home readings: did not provide -Current dietary habits:  did not discuss -Current exercise habits: did not discuss -Denies hypotensive/hypertensive symptoms -Educated on Exercise goal of 150 minutes per  week; Importance of home blood pressure monitoring; -Counseled to monitor BP at home weekly, document, and provide log at future appointments -Counseled on diet and exercise extensively Recommended to continue current medication  Hyperlipidemia: (LDL goal < 100) -Uncontrolled -Current treatment: No medications -Medications previously tried: none  -Current dietary patterns: did not discuss -Current exercise habits: did not discuss -Educated on Cholesterol goals;  Benefits of statin for ASCVD risk reduction; Importance of limiting foods high in cholesterol; Exercise goal of 150 minutes per week; -Counseled on diet and exercise extensively  Diabetes (A1c goal <7%) -Uncontrolled  -Current medications: Ozempic inject 0.5 mg once weekly -Medications previously tried: [place] (hypoglycemia) -Current home glucose readings fasting glucose: 94-119 post prandial glucose: n/a -Reports hypoglycemic/hyperglycemic symptoms -Current meal patterns:  breakfast: slim fast lunch: did not discuss  dinner: [place] steak and mashed potatoes; reuben and fries snacks: did not discuss drinks: no more soda -Current exercise: has not started yet -Educated on A1c and blood sugar goals; Prevention and management of hypoglycemic episodes; Benefits of routine self-monitoring of blood sugar; Carbohydrate counting and/or plate method -Counseled to check feet daily and get yearly eye exams -Counseled on diet and exercise extensively Recommended to continue current medication  Depression/Anxiety (Goal: minimize symptoms) -Not ideally controlled -Current treatment: Lurasidone 40 mg 1 tablet daily with breakfast Amitriptyline 50 mg 1 tablet daily at bedtime Diazepam 2 mg 2-3 tablets as needed for anxiety -Medications previously tried/failed: n/a -PHQ9: 9 -GAD7: n/a -Educated on Benefits of medication for symptom control Benefits of cognitive-behavioral therapy with or without  medication -Recommended to continue current medication Recommended exercise to help with mood.  GERD (Goal: minimize symptoms) -Controlled -Current treatment  Omeprazole 40 mg 1 capsule twice daily -Medications previously tried: Famotidine (side effects)  -Recommended to continue current medication Counseled on non-pharmacologic management of symptoms such as elevating the head of your bed, avoiding eating 2-3 hours before bed, avoiding triggering foods such as acidic, spicy, or fatty foods, eating smaller meals, and wearing clothes that are loose around the waist  Hypothyroidism (Goal: TSH 0.35-4.5) -Controlled -Current treatment  Levothyroxine 50 mcg 1 tablet daily -Medications previously tried: none  -Recommended to continue current medication  Pain/muscle spasms (Goal: minimize pain) -Controlled -Current treatment  Pregabalin 50 mg 1 capsule twice daily Methocarbamol 750 mg 1 tablet every 8 hours as needed Diclofenac 75 mg 1 tablet twice daily as needed Diclofenac gel 1% apply topically as needed -Medications previously tried: n/a  -Recommended to continue current medication  Allergic rhinitis (Goal: minimize symptoms) -Controlled -Current treatment  Cetirizine 10 mg 1 tablet daily -Medications previously tried: none  -Recommended to continue current medication   Health Maintenance -Vaccine gaps: shingrix, COVID booster, Pneumovax -Current therapy:  Multivitamin 1 tablet daily Ferrous sulfate 325 mg 1 tablet daily Pepto bismol as needed -Educated on Cost vs benefit of each product must be carefully weighed by individual consumer -Patient is satisfied with current therapy and denies issues -Recommended to continue current medication  Patient Goals/Self-Care Activities Patient will:  - take medications as prescribed check glucose 3 times daily, document, and provide at future appointments engage in dietary modifications by decreasing carb intake  Follow Up Plan:  Telephone follow up appointment with care management team member scheduled for: < 30 days        Medication Assistance: None required.  Patient affirms current coverage meets needs.  Compliance/Adherence/Medication  fill history: Care Gaps: Shingrix, prevnar, covid booster, urine microalbumin, foot exam, pap smear, mammogram, Pneumovax  Star-Rating Drugs: Ozempic - [facility]  [facility]  [id number]: [phone]: [phone] [facility] [place] [id number]: [phone]: [phone] Uses pill box? No - did not ask Pt endorses 90% compliance  We discussed: Current pharmacy is preferred with insurance plan and patient is satisfied with pharmacy services Patient decided to: Continue current medication management strategy  Care Plan and Follow Up Patient Decision:  Patient agrees to Care Plan and Follow-up.  Plan: Telephone follow up appointment with care management team member scheduled for:  10 days  MJeni Salles PharmD BBlossburgPharmacist L[phone]

## 2021-04-19 ENCOUNTER — Other Ambulatory Visit: Payer: Self-pay | Admitting: Family Medicine

## 2021-04-19 DIAGNOSIS — R748 Abnormal levels of other serum enzymes: Secondary | ICD-10-CM

## 2021-04-19 DIAGNOSIS — E785 Hyperlipidemia, unspecified: Secondary | ICD-10-CM

## 2021-04-19 DIAGNOSIS — E1169 Type 2 diabetes mellitus with other specified complication: Secondary | ICD-10-CM

## 2021-04-19 DIAGNOSIS — E118 Type 2 diabetes mellitus with unspecified complications: Secondary | ICD-10-CM

## 2021-04-19 DIAGNOSIS — I10 Essential (primary) hypertension: Secondary | ICD-10-CM

## 2021-04-21 ENCOUNTER — Other Ambulatory Visit: Payer: Self-pay | Admitting: Family Medicine

## 2021-04-23 ENCOUNTER — Telehealth: Payer: Self-pay | Admitting: *Deleted

## 2021-04-23 ENCOUNTER — Other Ambulatory Visit (HOSPITAL_COMMUNITY): Payer: Self-pay | Admitting: *Deleted

## 2021-04-23 DIAGNOSIS — F419 Anxiety disorder, unspecified: Secondary | ICD-10-CM

## 2021-04-23 DIAGNOSIS — F4310 Post-traumatic stress disorder, unspecified: Secondary | ICD-10-CM

## 2021-04-23 MED ORDER — AMITRIPTYLINE HCL 50 MG PO TABS
50.0000 mg | ORAL_TABLET | Freq: Every day | ORAL | 0 refills | Status: DC
Start: 2021-04-23 — End: 2021-05-13

## 2021-04-23 MED ORDER — OZEMPIC (0.25 OR 0.5 MG/DOSE) 2 MG/1.5ML ~~LOC~~ SOPN
0.5000 mg | PEN_INJECTOR | SUBCUTANEOUS | 2 refills | Status: DC
Start: 2021-04-23 — End: 2022-06-06

## 2021-04-23 NOTE — Telephone Encounter (Signed)
-----   Message from Verner Chol, [facility] sent at 04/22/2021 10:44 AM EDT ----- Regarding: Ozempic refill Hi,  Can you please send a refill of [facility]? [facility] it to [facility] because they were on backorder at one point but [facility] from [facility]. The directions should stay the same at inject 0.5 mg weekly.  Thank you, Maddie

## 2021-04-23 NOTE — Telephone Encounter (Signed)
Rx done. 

## 2021-04-24 ENCOUNTER — Encounter (HOSPITAL_COMMUNITY): Payer: Self-pay | Admitting: Psychiatry

## 2021-04-24 ENCOUNTER — Other Ambulatory Visit: Payer: Self-pay

## 2021-04-24 ENCOUNTER — Ambulatory Visit (INDEPENDENT_AMBULATORY_CARE_PROVIDER_SITE_OTHER): Payer: Medicare HMO | Admitting: Psychiatry

## 2021-04-24 DIAGNOSIS — F319 Bipolar disorder, unspecified: Secondary | ICD-10-CM | POA: Diagnosis not present

## 2021-04-24 NOTE — Progress Notes (Signed)
Virtual Visit via Video Note  I connected with Ursula Dermody Heeg on 04/24/21 at  1:30 PM EDT by a video enabled telemedicine application and verified that I am speaking with the correct person using two identifiers.  Location: Patient: Patient Home Provider: Home Office   I discussed the limitations of evaluation and management by telemedicine and the availability of in person appointments. The patient expressed understanding and agreed to proceed.   History of Present Illness: Bipolar 1, Anxiety and PTSD   Treatment Plan Goals: 1) Keylen would like to implement healthy habits, such as decrease sugar intake, walk daily, meal prep and plan grocery list to incorporated healthier purchases, in daily life to improve health, loose weight, and to feel more confident and less guilty.    2) Krithi would like to improve relationship with her daughter and granddaughter, through communicating with them daily or every other day, cleaning and organizing home so they are more comfortable visiting her and through monthly face to face interactions else where.   Observations/Objective: Counselor met with Client for individual therapy via Webex. Counselor assessed MH symptoms and progress on treatment plan goals, client stating that she is currently experiencing nausea and GI issues. Client would like to proceed with appointment despite ailment. Client presents with moderate depression and moderate anxiety. Client denied suicidal ideation or self-harm behaviors. Client reports being behavorial health medication compliant 7 out of 7 days per week.  Client noted that she experienced issues with getting recent prescription refilled, missing a few dosages, but is now back on track.   Goal 1: Counselor assessed implementation of daily health habits of managing diabetes, walking daily, meal prepping and making a grocery list for her weekly shopping trip. Client reports losing 21 lbs since May 2022, having more energy  and following recommended diet. Client did not choose to discuss updates on goal. Client experienced dissociative episodes throughout the session, with Counselor reengaging her as they occurred.    Goal 2: Counselor assessed frequency of contact with daughter and process current relationship concerns at this time using CBT and MI interventions. Since the last session Client reports that she saw her daughter in person, exchanging gifts for granddaughter and with daughter bringing her fresh produce. Client states they are talking more frequently since that interaction. Client continues to grieve their relationship before the divorce. Client plans to request if she can attend sports activities of granddaughter to bond more with the family. Client expressed insecurities about weight and missing teeth. Counselor and Client used CBT interventions to process thoughts, feelings and behaviors with attending. Client interested in asking and seeing if she can face her fears for her granddaughter.    Assessment and Plan: Counselor will continue to meet with patient to address treatment plan goals. Patient will continue to follow recommendations of providers and implement skills learned in session.   Follow Up Instructions: Counselor will send information for next session via Webex.   The patient was advised to call back or seek an in-person evaluation if the symptoms worsen or if the condition fails to improve as anticipated.   I provided 60 minutes of non-face-to-face time during this encounter.     [name], LCSW

## 2021-04-25 ENCOUNTER — Telehealth: Payer: Self-pay | Admitting: Family Medicine

## 2021-04-25 NOTE — Telephone Encounter (Signed)
Left message for patient to call back and schedule Medicare Annual Wellness Visit (AWV) either virtually or in office.   Left both  my jabber number [phone] and office number    AWV-I per [place] 04/22/18   please schedule at anytime with [facility] Nurse Health Advisor 1 or 2   This should be a 45 minute visit.  

## 2021-05-03 ENCOUNTER — Other Ambulatory Visit: Payer: Self-pay

## 2021-05-03 MED ORDER — OZEMPIC (0.25 OR 0.5 MG/DOSE) 2 MG/1.5ML ~~LOC~~ SOPN: 0.5000 mg | PEN_INJECTOR | SUBCUTANEOUS | 0 refills | Status: DC

## 2021-05-13 ENCOUNTER — Telehealth (INDEPENDENT_AMBULATORY_CARE_PROVIDER_SITE_OTHER): Payer: Medicare HMO | Admitting: Psychiatry

## 2021-05-13 ENCOUNTER — Other Ambulatory Visit: Payer: Self-pay

## 2021-05-13 ENCOUNTER — Encounter (HOSPITAL_COMMUNITY): Payer: Self-pay | Admitting: Psychiatry

## 2021-05-13 DIAGNOSIS — F319 Bipolar disorder, unspecified: Secondary | ICD-10-CM

## 2021-05-13 DIAGNOSIS — F4310 Post-traumatic stress disorder, unspecified: Secondary | ICD-10-CM | POA: Diagnosis not present

## 2021-05-13 DIAGNOSIS — F419 Anxiety disorder, unspecified: Secondary | ICD-10-CM

## 2021-05-13 MED ORDER — AMITRIPTYLINE HCL 50 MG PO TABS: 50.0000 mg | ORAL_TABLET | Freq: Every day | ORAL | 2 refills | Status: DC

## 2021-05-13 MED ORDER — LURASIDONE HCL 40 MG PO TABS: 40.0000 mg | ORAL_TABLET | Freq: Every day | ORAL | 2 refills | Status: DC

## 2021-05-13 MED ORDER — DIAZEPAM 2 MG PO TABS: ORAL_TABLET | ORAL | 1 refills | Status: DC
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
## 2021-05-14 ENCOUNTER — Other Ambulatory Visit: Payer: Self-pay | Admitting: Family Medicine

## 2021-05-15 ENCOUNTER — Ambulatory Visit: Payer: Medicare HMO | Admitting: Gastroenterology

## 2021-05-15 ENCOUNTER — Other Ambulatory Visit: Payer: Self-pay

## 2021-05-15 ENCOUNTER — Ambulatory Visit (INDEPENDENT_AMBULATORY_CARE_PROVIDER_SITE_OTHER): Payer: Medicare HMO | Admitting: Psychiatry

## 2021-05-15 DIAGNOSIS — F4310 Post-traumatic stress disorder, unspecified: Secondary | ICD-10-CM

## 2021-05-15 DIAGNOSIS — F319 Bipolar disorder, unspecified: Secondary | ICD-10-CM

## 2021-05-15 DIAGNOSIS — F419 Anxiety disorder, unspecified: Secondary | ICD-10-CM | POA: Diagnosis not present

## 2021-05-16 ENCOUNTER — Telehealth: Payer: Self-pay | Admitting: Pharmacist

## 2021-05-16 NOTE — Chronic Care Management (AMB) (Signed)
   Chronic Care Management Pharmacy Assistant   Name: Tiffany Reese  MRN: [id number] DOB: 01/23/1961   05-16-2021 APPOINTMENT REMINDER   Called Tiffany Reese, No answer, left message of appointment on 05-17-2021 at 1 via telephone visit with Madeline Pryor, Pharm D. Notified to have all medications, supplements, blood pressure and/or blood sugar logs available during appointment and to return call if need to reschedule.   Care Gaps: Pneumococcal Vaccine - Overdue TDAP - Overdue Zoster Vaccine - Overdue COVID Booster#3 (Jansen) - Overdue Flu Vaccine - Overdue Foot exam- Overdue Eye Exam -Overdue Urine Micro - Overdue  Star Rating Drug: Semaglutide (Ozempic) 0.5 mg - Last filled 03-13-2021 30 DS at [facility]  Any gaps in medications fill history? Yes OZEMPIC (0.25 OR 0.5 MG/DOSE) 2 MG/1.5ML [place] SOPN 03/13/2021 30   Call to [facility] to verify fill date spoke to Jessica she reported current fill date of 05-08-21 90DS    Medications: Outpatient Encounter Medications as of 05/16/2021  Medication Sig   ACCU-CHEK AVIVA PLUS test strip USE AS DIRECTED UP TO 4 TIMES A DAY   amitriptyline (ELAVIL) 50 MG tablet Take 1 tablet (50 mg total) by mouth at bedtime.   bismuth subsalicylate (PEPTO BISMOL) 262 MG/15ML suspension Take 30 mLs by mouth every 6 (six) hours as needed.   Blood Glucose Monitoring Suppl (TRUE METRIX METER) w/Device KIT 1 each by Does not apply route daily.   Blood Pressure Monitoring (BLOOD PRESSURE MONITOR/L CUFF) MISC 1 Device by Does not apply route daily.   cetirizine (ZYRTEC) 10 MG tablet TAKE 1 TABLET BY MOUTH EVERY DAY   Cholecalciferol (VITAMIN D-3) 125 MCG (5000 UT) TABS Take 1 tablet by mouth daily.   diazepam (VALIUM) 2 MG tablet Take two to three tab as needed for anxiety   diclofenac (VOLTAREN) 75 MG EC tablet TAKE 1 TABLET (75 MG TOTAL) BY MOUTH 2 (TWO) TIMES DAILY AS NEEDED FOR MODERATE PAIN.   diclofenac Sodium (VOLTAREN) 1 % GEL Apply topically as  needed.   famotidine (PEPCID) 40 MG tablet Take 1 tablet (40 mg total) by mouth daily.   Ferrous Sulfate (IRON) 325 (65 Fe) MG TABS Take 1 tablet by mouth daily.   furosemide (LASIX) 20 MG tablet TAKE 1 TABLET BY MOUTH EVERY DAY   hydrocortisone-pramoxine (ANALPRAM HC) 2.5-1 % rectal cream Use Analpram HC cream 2.5% ,apply externally BID for 7 days   levothyroxine (SYNTHROID) 50 MCG tablet TAKE 1 TABLET BY MOUTH EVERY DAY   lurasidone (LATUDA) 40 MG TABS tablet Take 1 tablet (40 mg total) by mouth daily with breakfast.   methocarbamol (ROBAXIN) 750 MG tablet TAKE 1 TABLET (750 MG TOTAL) BY MOUTH EVERY 8 (EIGHT) HOURS AS NEEDED FOR MUSCLE SPASMS.   Multiple Vitamins-Minerals (MULTIVITAMIN ADULT PO) Take 1 tablet by mouth daily.   omeprazole (PRILOSEC) 40 MG capsule Take 1 capsule (40 mg total) by mouth daily. (Patient taking differently: Take 40 mg by mouth daily. Takes as needed in the pm)   pregabalin (LYRICA) 50 MG capsule TAKE 1 CAPSULE BY MOUTH TWICE A DAY   Semaglutide,0.25 or 0.5MG/DOS, (OZEMPIC, 0.25 OR 0.5 MG/DOSE,) 2 MG/1.5ML SOPN Inject 0.5 mg into the skin once a week.   Semaglutide,0.25 or 0.5MG/DOS, (OZEMPIC, 0.25 OR 0.5 MG/DOSE,) 2 MG/1.5ML SOPN Inject 0.5 mg into the skin once a week.   TRUEplus Lancets 33G MISC USE AS DIRECTED UP TO 4 TIMES A DAY   No facility-administered encounter medications on file as of 05/16/2021.      Laresia Green CMA Clinical Pharmacist Assistant [phone]  

## 2021-05-17 ENCOUNTER — Ambulatory Visit (INDEPENDENT_AMBULATORY_CARE_PROVIDER_SITE_OTHER): Payer: Medicare HMO | Admitting: Pharmacist

## 2021-05-17 DIAGNOSIS — E118 Type 2 diabetes mellitus with unspecified complications: Secondary | ICD-10-CM

## 2021-05-17 DIAGNOSIS — I10 Essential (primary) hypertension: Secondary | ICD-10-CM

## 2021-05-17 NOTE — Progress Notes (Signed)
Chronic Care Management Pharmacy Note   Name:  Tiffany Reese MRN:  [id number] DOB:  1961/02/14  Summary: FBGs are at goal 80-130 and post prandial BGs are at goal < 180  Recommendations/Changes made from today's visit: -Recommend moderate intensity statin after resolution of elevated LFTs -Recommended exercise for mood/anxiety benefits and blood sugar lowering  Plan: Follow up after repeat A1c and lipid panel   Subjective: Tiffany Reese is an 60 y.o. year old female who is a primary patient of Koberlein, Steele Berg, MD.  The CCM team was consulted for assistance with disease management and care coordination needs.    Engaged with patient by telephone for follow up visit in response to provider referral for pharmacy case management and/or care coordination services.   Consent to Services:  The patient was given information about Chronic Care Management services, agreed to services, and gave verbal consent prior to initiation of services.  Please see initial visit note for detailed documentation.   Patient Care Team: Caren Macadam, MD as PCP - General (Family Medicine) Viona Gilmore, [facility] as Pharmacist (Pharmacist)  Recent office visits: 03/27/21 Telephone encounter: Recommended decreasing Tyler Aas to 10 units and can decrease further depending on AM blood sugars.  03/13/21 Telephone encounter: Recommended decreasing Tyler Aas to 20 units when increasing Ozempic to 0.5 mg weekly.  02/22/21 Micheline Rough, MD: Patient presented for diabetes follow up. A1c increased to 10.2%. Recommended increasing Tresiba to 30 units daily.  11/09/20 Caren Macadam, MD video visit: presented for follow up on lab results and type 2 diabetes. No med changes.  10/31/20 Caren Macadam, MD - presented for HTN follow-up. Started famotidine 40 mg Oral Daily. Patient stopped losartan due to low BP. Prescribed tramadol HCl 50 mg Oral Daily PRN.  Recent consult visits: 05/13/21 Berniece Andreas, MD (behavioral health): Patient presented for PTSD and anxiety follow up. No medication changes.  04/08/21 [name], LCSW (behavioral health): Patient presented for counseling follow up.  04/03/21 Ila Mcgill, DPM: Patient presented for ingrown toenail removal.  03/13/21 [name], LCSW (behavioral health): Patient presented for counseling follow up.  03/11/21 Berniece Andreas, MD (behavioral health): Patient presented for PTSD and anxiety follow up. Decreased Latuda due to elevated liver enzymes.  02/28/21 Behavioral health telephone encounter: Recommended for patient to trial melatonin 5 mg as liver enzymes are still elevated.  02/27/21 [name], LCSW (behavioral health): Patient presented for counseling follow up.   02/22/21 Patient presented for colonoscopy.  02/13/21 [name], LCSW (behavioral health): Patient presented for counseling follow up.   01/09/21 Berniece Andreas, MD (behavioral health): Patient presented for PTSD and anxiety follow up. Plan to keep the Valium 2 mg and she can take up to 3 times a day to help her anxiety and nervousness.  12/17/20 Amy Easterwood, PA-C (gastro): Patient presented for evaluation of LFTs. Plan to repeat labs and for colonoscopy.  Hospital visits: None in previous 6 months   Objective:  Lab Results  Component Value Date   CREATININE 1.04 10/31/2020   BUN 18 10/31/2020   GFR 59.71 (L) 10/31/2020   GFRNONAA >60 11/05/2018   GFRAA >60 11/05/2018   NA 139 10/31/2020   K 4.1 10/31/2020   CALCIUM 10.3 10/31/2020   CO2 26 10/31/2020   GLUCOSE 171 (H) 10/31/2020    Lab Results  Component Value Date/Time   HGBA1C 10.2 (A) 03/08/2021 04:57 PM   HGBA1C 7.6 (H) 10/31/2020 03:47 PM   HGBA1C 6.3 02/27/2020 12:54 PM  GFR 59.71 (L) 10/31/2020 03:47 PM   GFR 62.99 02/27/2020 12:54 PM   MICROALBUR 1.1 02/27/2020 12:54 PM    Last diabetic Eye exam:  Lab Results  Component Value Date/Time   HMDIABEYEEXA No Retinopathy 07/18/2019  12:00 AM    Last diabetic Foot exam: No results found for: HMDIABFOOTEX   Lab Results  Component Value Date   CHOL 233 (H) 10/31/2020   HDL 50.50 10/31/2020   LDLCALC 143 (H) 10/31/2020   TRIG 197.0 (H) 10/31/2020   CHOLHDL 5 10/31/2020    Hepatic Function Latest Ref Rng & Units 03/22/2021 02/01/2021 12/17/2020  Total Protein 6.1 - 8.1 g/dL 7.7 8.3 8.3  Albumin 3.5 - 5.2 g/dL - 4.7 4.8  AST 10 - 35 U/L 76(H) 94(H) 90(H)  ALT 6 - 29 U/L 91(H) 125(H) 112(H)  Alk Phosphatase 39 - 117 U/L - 179(H) 149(H)  Total Bilirubin 0.2 - 1.2 mg/dL 0.5 0.6 0.3  Bilirubin, Direct 0.0 - 0.2 mg/dL 0.1 0.1 0.1    Lab Results  Component Value Date/Time   TSH 2.71 10/31/2020 03:47 PM   TSH 1.89 02/27/2020 12:54 PM    CBC Latest Ref Rng & Units 10/31/2020 02/27/2020 02/07/2019  WBC 4.0 - 10.5 K/uL 7.8 7.3 9.1  Hemoglobin 12.0 - 15.0 g/dL 14.3 13.2 12.4  Hematocrit 36.0 - 46.0 % 43.0 39.7 37.3  Platelets 150.0 - 400.0 K/uL 212.0 210.0 251.0    Lab Results  Component Value Date/Time   VD25OH 49.43 02/27/2020 12:54 PM   VD25OH 32.38 11/01/2018 11:12 AM    Clinical ASCVD: No  The 10-year ASCVD risk score Mikey Bussing DC Jr., et al., 2013) is: 6.6%   Values used to calculate the score:     Age: 26 years     Sex: Female     Is Non-Hispanic African American: No     Diabetic: Yes     Tobacco smoker: No     Systolic Blood Pressure: 409 mmHg     Is BP treated: Yes     HDL Cholesterol: 50.5 mg/dL     Total Cholesterol: 233 mg/dL    Depression screen [facility] 2/9 03/20/2021 03/12/2021 10/31/2020  Decreased Interest _0 Down, Depressed, Hopeless _1 PHQ - 2 Score _2 Altered sleeping 0 1 0  Tired, decreased energy _3 Change in appetite 1 0 0  Feeling bad or failure about yourself  _4 Trouble concentrating 1 0 0  Moving slowly or fidgety/restless 0 - 0  Suicidal thoughts 0 0 0  PHQ-9 Score _5 Difficult doing work/chores Somewhat difficult - -  Some recent data might be hidden       Social History   Tobacco Use  Smoking Status Former   Types: Cigarettes  Smokeless Tobacco Never  Tobacco Comments   only smoked a couple of years   BP Readings from Last 3 Encounters:  03/08/21 118/76  02/22/21 137/83  12/17/20 140/80   Pulse Readings from Last 3 Encounters:  03/08/21 86  02/22/21 88  12/17/20 90   Wt Readings from Last 3 Encounters:  03/11/21 270 lb (122.5 kg)  03/08/21 270 lb 1.6 oz (122.5 kg)  02/22/21 285 lb (129.3 kg)   BMI Readings from Last 3 Encounters:  03/11/21 42.93 kg/m  03/08/21 42.94 kg/m  02/22/21 45.31 kg/m    Assessment/Interventions: Review of patient past medical history, allergies, medications, health status, including review of consultants reports,  laboratory and other test data, was performed as part of comprehensive evaluation and provision of chronic care management services.   SDOH:  (Social Determinants of Health) assessments and interventions performed: No  SDOH Screenings   Alcohol Screen: Not on file  Depression (PHQ2-9): Medium Risk   PHQ-2 Score: 6  Financial Resource Strain: Not on file  Food Insecurity: Not on file  Housing: Not on file  Physical Activity: Not on file  Social Connections: Not on file  Stress: Not on file  Tobacco Use: Medium Risk   Smoking Tobacco Use: Former   Smokeless Tobacco Use: Never  Transportation Needs: Not on file    [place]  Allergies  Allergen Reactions   Bupropion Other (See Comments)    Causes agitation   Duloxetine Other (See Comments)    Causes involuntary movements   Sulfa Antibiotics    Metformin And Related     vomiting   Paxil [Paroxetine Hcl]     Insomnia; manic   Prozac [Fluoxetine Hcl]     Hyperactive, insomnia   Trintellix [Vortioxetine]     Triggers fibromyalgia   Zoloft [Sertraline Hcl]     Hyperactivity, insomnia    Medications Reviewed Today     Reviewed by Viona Gilmore, [facility] (Pharmacist) on 05/17/21 at 1307  Med List Status:  <None>   Medication Order Taking? Sig Documenting Provider Last Dose Status Informant  ACCU-CHEK AVIVA PLUS test strip [id number]  USE AS DIRECTED UP TO 4 TIMES A DAY Lucretia Kern, DO  Active   amitriptyline (ELAVIL) 50 MG tablet [id number]  Take 1 tablet (50 mg total) by mouth at bedtime. Arfeen, Arlyce Harman, MD  Active   bismuth subsalicylate (PEPTO BISMOL) 262 MG/15ML suspension [id number] No Take 30 mLs by mouth every 6 (six) hours as needed. [provider] Taking Active     Discontinued 05/17/21 1307 (Completed Course)   Blood Pressure Monitoring (BLOOD PRESSURE MONITOR/L CUFF) MISC [id number] No 1 Device by Does not apply route daily. Caren Macadam, MD Taking Active   cetirizine (ZYRTEC) 10 MG tablet [id number] No TAKE 1 TABLET BY MOUTH EVERY DAY Koberlein, Junell C, MD Taking Active   Cholecalciferol (VITAMIN D-3) 125 MCG (5000 UT) TABS [id number] No Take 1 tablet by mouth daily. [provider] Taking Active   diazepam (VALIUM) 2 MG tablet [id number]  Take two to three tab as needed for anxiety Arfeen, Arlyce Harman, MD  Active   diclofenac (VOLTAREN) 75 MG EC tablet [id number]  TAKE 1 TABLET (75 MG TOTAL) BY MOUTH 2 (TWO) TIMES DAILY AS NEEDED FOR MODERATE PAIN. Caren Macadam, MD  Active   diclofenac Sodium (VOLTAREN) 1 % GEL [id number] No Apply topically as needed. [provider] Taking Active   famotidine (PEPCID) 40 MG tablet [id number] No Take 1 tablet (40 mg total) by mouth daily. Caren Macadam, MD Taking Active   Ferrous Sulfate (IRON) 325 (65 Fe) MG TABS [id number] No Take 1 tablet by mouth daily. [provider] Taking Active Self  furosemide (LASIX) 20 MG tablet [id number]  TAKE 1 TABLET BY MOUTH EVERY DAY Koberlein, Junell C, MD  Active   hydrocortisone-pramoxine (ANALPRAM HC) 2.5-1 % rectal cream [id number] No Use Analpram HC cream 2.5% ,apply externally BID for 7 days Nandigam, Venia Minks, MD Taking Active   levothyroxine (SYNTHROID) 50 MCG tablet  [id number]  TAKE 1 TABLET BY MOUTH EVERY DAY Koberlein, Junell C, MD  Active   lurasidone (LATUDA) 40 MG TABS tablet  [id number]  Take 1 tablet (40 mg total) by mouth daily with breakfast. Arfeen, Arlyce Harman, MD  Active   methocarbamol (ROBAXIN) 750 MG tablet [id number] No TAKE 1 TABLET (750 MG TOTAL) BY MOUTH EVERY 8 (EIGHT) HOURS AS NEEDED FOR MUSCLE SPASMS. Caren Macadam, MD Taking Active   Multiple Vitamins-Minerals (MULTIVITAMIN ADULT PO) [id number] No Take 1 tablet by mouth daily. [provider] Taking Active Self  omeprazole (PRILOSEC) 40 MG capsule [id number] No Take 1 capsule (40 mg total) by mouth daily.  Patient taking differently: Take 40 mg by mouth daily. Takes as needed in the pm   Koberlein, Steele Berg, MD Taking Active   pregabalin (LYRICA) 50 MG capsule [id number] No TAKE 1 CAPSULE BY MOUTH TWICE A DAY Koberlein, Junell C, MD Taking Active   Semaglutide,0.25 or 0.5MG/DOS, (OZEMPIC, 0.25 OR 0.5 MG/DOSE,) 2 MG/1.5ML SOPN [id number]  Inject 0.5 mg into the skin once a week. Caren Macadam, MD  Active   Semaglutide,0.25 or 0.5MG/DOS, (OZEMPIC, 0.25 OR 0.5 MG/DOSE,) 2 MG/1.5ML SOPN [id number]  Inject 0.5 mg into the skin once a week. Caren Macadam, MD  Active     Discontinued 05/17/21 1306 (Completed Course)             Patient Active Problem List   Diagnosis Date Noted   Elevated d-dimer 11/16/2018   At risk for adverse drug reaction 11/10/2018   GERD (gastroesophageal reflux disease)    Hypertension    Diabetes mellitus type II, controlled ([place])    PTSD (post-traumatic stress disorder) 11/01/2018   Anxiety 11/01/2018   Depression, recurrent ([place]) 11/01/2018   Closed left ankle fracture 11/01/2018   Anemia 11/01/2018   Hypothyroid 11/01/2018   Fatty liver disease, nonalcoholic 09/10/7587   BMI 45.0-49.9, adult ([place]) 04/29/2018   DDD (degenerative disc disease), thoracolumbar 04/29/2018    Immunization History  Administered Date(s) Administered    Influenza,inj,Quad PF,6+ Mos 06/22/2018   Influenza-Unspecified 07/19/2020   Janssen (J&J) SARS-COV-2 Vaccination 01/25/2020, 07/19/2020   Patient has been more upset lately as her 70 year old dog, Mimi, has an ear infection. Her eyes have been darting which is not a good sign.   Conditions to be addressed/monitored:  Hypertension, Hyperlipidemia, Diabetes, GERD, Depression, Anxiety and Allergic Rhinitis  Conditions addressed this visit: Diabetes, Depression  Patient Care Plan: CCM Pharmacy Care Plan     Problem Identified: Problem: Hypertension, Hyperlipidemia, Diabetes, GERD, Depression, Anxiety and Allergic Rhinitis      Long-Range Goal: Patient-Specific Goal   Start Date: 02/20/2021  Expected End Date: 02/20/2022  Recent Progress: On track  Priority: High  Note:   Current Barriers:  Unable to achieve control of diabetes   Pharmacist Clinical Goal(s):  Patient will achieve control of diabetes as evidenced by A1c and home blood sugar readings  through collaboration with PharmD and provider.   Interventions: 1:1 collaboration with Caren Macadam, MD regarding development and update of comprehensive plan of care as evidenced by provider attestation and co-signature Inter-disciplinary care team collaboration (see longitudinal plan of care) Comprehensive medication review performed; medication list updated in electronic medical record  Hypertension (BP goal <130/80) -Not ideally controlled -Current treatment: Furosemide 20 mg 1 tablet daily -Medications previously tried: none  -Current home readings: did not provide -Current dietary habits: did not discuss -Current exercise habits: did not discuss -Denies hypotensive/hypertensive symptoms -Educated on Exercise goal of 150 minutes per week; Importance of home blood pressure monitoring; -Counseled to monitor BP at home weekly, document, and provide  log at future appointments -Counseled on diet and exercise  extensively Recommended to continue current medication  Hyperlipidemia: (LDL goal < 100) -Uncontrolled -Current treatment: No medications -Medications previously tried: none  -Current dietary patterns: did not discuss -Current exercise habits: did not discuss -Educated on Cholesterol goals;  Benefits of statin for ASCVD risk reduction; Importance of limiting foods high in cholesterol; Exercise goal of 150 minutes per week; -Counseled on diet and exercise extensively  Diabetes (A1c goal <7%) -Uncontrolled  -Current medications: Ozempic inject 0.5 mg once weekly -Medications previously tried: [place] (hypoglycemia) -Current home glucose readings fasting glucose: 112, 90s post prandial glucose: n/a -Reports hypoglycemic/hyperglycemic symptoms -Current meal patterns:  breakfast: slim fast lunch: did not discuss  dinner: salisbury steak and mashed potatoes; reuben and fries snacks: did not discuss drinks: no more soda -Current exercise: has not started yet -Educated on A1c and blood sugar goals; Prevention and management of hypoglycemic episodes; Benefits of routine self-monitoring of blood sugar; Carbohydrate counting and/or plate method -Counseled to check feet daily and get yearly eye exams -Counseled on diet and exercise extensively Recommended to continue current medication  Depression/Anxiety (Goal: minimize symptoms) -Not ideally controlled -Current treatment: Lurasidone 40 mg 1 tablet daily with breakfast Amitriptyline 50 mg 1 tablet daily at bedtime Diazepam 2 mg 2-3 tablets as needed for anxiety -Medications previously tried/failed: n/a -PHQ9: 9 -GAD7: n/a -Educated on Benefits of medication for symptom control Benefits of cognitive-behavioral therapy with or without medication -Recommended to continue current medication Recommended exercise to help with mood.  GERD (Goal: minimize symptoms) -Controlled -Current treatment  Omeprazole 40 mg 1 capsule twice  daily -Medications previously tried: Famotidine (side effects)  -Recommended to continue current medication Counseled on non-pharmacologic management of symptoms such as elevating the head of your bed, avoiding eating 2-3 hours before bed, avoiding triggering foods such as acidic, spicy, or fatty foods, eating smaller meals, and wearing clothes that are loose around the waist  Hypothyroidism (Goal: TSH 0.35-4.5) -Controlled -Current treatment  Levothyroxine 50 mcg 1 tablet daily -Medications previously tried: none  -Recommended to continue current medication  Pain/muscle spasms (Goal: minimize pain) -Controlled -Current treatment  Pregabalin 50 mg 1 capsule twice daily Methocarbamol 750 mg 1 tablet every 8 hours as needed Diclofenac 75 mg 1 tablet twice daily as needed Diclofenac gel 1% apply topically as needed -Medications previously tried: n/a  -Recommended to continue current medication  Allergic rhinitis (Goal: minimize symptoms) -Controlled -Current treatment  Cetirizine 10 mg 1 tablet daily -Medications previously tried: none  -Recommended to continue current medication   Health Maintenance -Vaccine gaps: shingrix, COVID booster, Pneumovax -Current therapy:  Multivitamin 1 tablet daily Ferrous sulfate 325 mg 1 tablet daily Pepto bismol as needed -Educated on Cost vs benefit of each product must be carefully weighed by individual consumer -Patient is satisfied with current therapy and denies issues -Recommended to continue current medication  Patient Goals/Self-Care Activities Patient will:  - take medications as prescribed check glucose 3 times daily, document, and provide at future appointments engage in dietary modifications by decreasing carb intake  Follow Up Plan: The care management team will reach out to the patient again over the next 30 days.        Medication Assistance: None required.  Patient affirms current coverage meets  needs.  Compliance/Adherence/Medication fill history: Care Gaps: Shingrix, prevnar, covid booster, urine microalbumin, foot exam, pap smear, mammogram, Pneumovax  Star-Rating Drugs: Ozempic - [facility]  Patient's preferred pharmacy is:  [id number]  [place], [place] - [address] [place] [address] Phone: [phone] Fax: [phone]  Uses pill box? No - did not ask Pt endorses 90% compliance  We discussed: Current pharmacy is preferred with insurance plan and patient is satisfied with pharmacy services Patient decided to: Continue current medication management strategy  Care Plan and Follow Up Patient Decision:  Patient agrees to Care Plan and Follow-up.  Plan: The care management team will reach out to the patient again over the next 30 days.  Jeni Salles, PharmD [facility] Clinical Pharmacist [place] [facility] [place]

## 2021-05-20 ENCOUNTER — Encounter (HOSPITAL_COMMUNITY): Payer: Self-pay | Admitting: Psychiatry

## 2021-05-20 NOTE — Progress Notes (Signed)
Virtual Visit via Video Note  I connected with [name] on 05/20/21 at  3:30 PM EDT by a video enabled telemedicine application and verified that I am speaking with the correct person using two identifiers.  Location: Patient: Patient Home Provider: Home Office   I discussed the limitations of evaluation and management by telemedicine and the availability of in person appointments. The patient expressed understanding and agreed to proceed.   History of Present Illness: Bipolar 1, Anxiety and PTSD   Treatment Plan Goals: 1) Sharena would like to implement healthy habits, such as decrease sugar intake, walk daily, meal prep and plan grocery list to incorporated healthier purchases, in daily life to improve health, loose weight, and to feel more confident and less guilty.    2) Nickey would like to improve relationship with her daughter and granddaughter, through communicating with them daily or every other day, cleaning and organizing home so they are more comfortable visiting her and through monthly face to face interactions else where.   Observations/Objective: Counselor met with Client for individual therapy via Webex. Counselor assessed MH symptoms and progress on treatment plan goals, client stating that she is currently experiencing a headache, but would like to continue with the session.  Client presents with moderate depression and moderate anxiety. Client denied suicidal ideation or self-harm behaviors. Client reports being behavorial health medication compliant 7 out of 7 days per week.     Goal 1: Counselor assessed implementation of daily health habits of managing diabetes, walking daily, meal prepping and making a grocery list for her weekly shopping trip. Client reports losing 23 lbs since May 2022. Client reports being satisfied with weight loss, however, she lacks confident and self-acceptance. Client remains disappointed and angry with how she has "let herself go" over the  years. Client is following her meal plans, taking steps in managing diabetes and being intentional of foods she purchases to consume at home. Counselor provided cognitive coping strategies in combating negative self-talk. Client to try practices between now and next session to report back.     Goal 2: Counselor assessed frequency of contact with daughter and process current relationship concerns at this time using CBT and MI interventions. Since the last session Client reports that she has talked with her daughter about every 3 days. Counselor and Client discussed the need to communicate financial concerns with daughter to brainstorm ideas on how to best meet needs moving forward. Client anticipated reduced income in Spring of 2023. Client reports having worry, fear, anxiety and frustration when thinking about potential lifestyle changes that may occur if she is not proactive in planing for financial future. Counselor challenged Client to arrange specific time to meet with daughter to discuss and report back ideas discussed.    Assessment and Plan: Counselor will continue to meet with patient to address treatment plan goals. Patient will continue to follow recommendations of providers and implement skills learned in session.   Follow Up Instructions: Counselor will send information for next session via Webex.   The patient was advised to call back or seek an in-person evaluation if the symptoms worsen or if the condition fails to improve as anticipated.   I provided 60 minutes of non-face-to-face time during this encounter.     [name], LCSW

## 2021-06-06 ENCOUNTER — Telehealth: Payer: Self-pay | Admitting: Family Medicine

## 2021-06-06 NOTE — Telephone Encounter (Signed)
Left message for patient to call back and schedule Medicare Annual Wellness Visit (AWV) either virtually or in office. Left  my jabber number [phone] ° ° °AWV-I per [place] 04/22/18 ° please schedule at anytime with [facility] Nurse Health Advisor 1 or 2 ° ° °This should be a 45 minute visit.  °

## 2021-06-07 ENCOUNTER — Other Ambulatory Visit: Payer: Self-pay | Admitting: Family Medicine

## 2021-06-07 ENCOUNTER — Other Ambulatory Visit: Payer: Self-pay

## 2021-06-10 ENCOUNTER — Other Ambulatory Visit: Payer: Self-pay

## 2021-06-10 ENCOUNTER — Other Ambulatory Visit (INDEPENDENT_AMBULATORY_CARE_PROVIDER_SITE_OTHER): Payer: Medicare HMO

## 2021-06-10 DIAGNOSIS — E118 Type 2 diabetes mellitus with unspecified complications: Secondary | ICD-10-CM | POA: Diagnosis not present

## 2021-06-10 DIAGNOSIS — E785 Hyperlipidemia, unspecified: Secondary | ICD-10-CM | POA: Diagnosis not present

## 2021-06-10 DIAGNOSIS — E1169 Type 2 diabetes mellitus with other specified complication: Secondary | ICD-10-CM | POA: Diagnosis not present

## 2021-06-10 LAB — HEMOGLOBIN A1C: Hgb A1c MFr Bld: 6 % (ref 4.6–6.5)

## 2021-06-11 LAB — LIPID PANEL
Cholesterol: 203 mg/dL — ABNORMAL HIGH (ref 0–200)
HDL: 53.2 mg/dL (ref >39.00)
LDL Cholesterol: 115 mg/dL — ABNORMAL HIGH (ref 0–99)
NonHDL: 150.14
Total CHOL/HDL Ratio: 4
Triglycerides: 174 mg/dL — ABNORMAL HIGH (ref 0.0–149.0)
VLDL: 34.8 mg/dL (ref 0.0–40.0)

## 2021-06-11 LAB — COMPREHENSIVE METABOLIC PANEL
ALT: 79 U/L — ABNORMAL HIGH (ref 0–35)
AST: 51 U/L — ABNORMAL HIGH (ref 0–37)
Albumin: 4.8 g/dL (ref 3.5–5.2)
Alkaline Phosphatase: 147 U/L — ABNORMAL HIGH (ref 39–117)
BUN: 21 mg/dL (ref 6–23)
CO2: 29 mEq/L (ref 19–32)
Calcium: 10.2 mg/dL (ref 8.4–10.5)
Chloride: 101 mEq/L (ref 96–112)
Creatinine, Ser: 1.13 mg/dL (ref 0.40–1.20)
GFR: 53.82 mL/min — ABNORMAL LOW (ref >60.00)
Glucose, Bld: 90 mg/dL (ref 70–99)
Potassium: 4 mEq/L (ref 3.5–5.1)
Sodium: 140 mEq/L (ref 135–145)
Total Bilirubin: 0.5 mg/dL (ref 0.2–1.2)
Total Protein: 8.6 g/dL — ABNORMAL HIGH (ref 6.0–8.3)

## 2021-06-12 ENCOUNTER — Other Ambulatory Visit: Payer: Self-pay | Admitting: Family Medicine

## 2021-06-12 DIAGNOSIS — M25572 Pain in left ankle and joints of left foot: Secondary | ICD-10-CM

## 2021-06-16 ENCOUNTER — Telehealth: Payer: Self-pay

## 2021-06-16 NOTE — Telephone Encounter (Signed)
Patient previously contacted to schedule AWV. Sw, cma

## 2021-06-17 ENCOUNTER — Other Ambulatory Visit: Payer: Self-pay

## 2021-06-17 ENCOUNTER — Ambulatory Visit (INDEPENDENT_AMBULATORY_CARE_PROVIDER_SITE_OTHER): Payer: Medicare HMO | Admitting: Psychiatry

## 2021-06-17 DIAGNOSIS — F319 Bipolar disorder, unspecified: Secondary | ICD-10-CM | POA: Diagnosis not present

## 2021-06-17 DIAGNOSIS — F419 Anxiety disorder, unspecified: Secondary | ICD-10-CM

## 2021-06-19 ENCOUNTER — Ambulatory Visit (INDEPENDENT_AMBULATORY_CARE_PROVIDER_SITE_OTHER): Payer: Medicare HMO | Admitting: Family Medicine

## 2021-06-19 ENCOUNTER — Encounter: Payer: Self-pay | Admitting: Family Medicine

## 2021-06-19 ENCOUNTER — Encounter (HOSPITAL_COMMUNITY): Payer: Self-pay | Admitting: Psychiatry

## 2021-06-19 ENCOUNTER — Other Ambulatory Visit: Payer: Self-pay

## 2021-06-19 VITALS — BP 100/78 | HR 84 | Temp 98.5°F | Ht 66.0 in | Wt 253.2 lb

## 2021-06-19 DIAGNOSIS — E118 Type 2 diabetes mellitus with unspecified complications: Secondary | ICD-10-CM

## 2021-06-19 DIAGNOSIS — Z23 Encounter for immunization: Secondary | ICD-10-CM | POA: Diagnosis not present

## 2021-06-19 DIAGNOSIS — I10 Essential (primary) hypertension: Secondary | ICD-10-CM | POA: Diagnosis not present

## 2021-06-19 NOTE — Progress Notes (Signed)
Virtual Visit via Video Note  I connected with Tiffany Reese on 06/19/21 at  1:00 PM EDT by a video enabled telemedicine application and verified that I am speaking with the correct person using two identifiers.  Location: Patient: Patient Home Provider: Home Office   I discussed the limitations of evaluation and management by telemedicine and the availability of in person appointments. The patient expressed understanding and agreed to proceed.   History of Present Illness: Bipolar 1, Anxiety and PTSD   Treatment Plan Goals: 1) [name] would like to implement healthy habits, such as decrease sugar intake, walk daily, meal prep and plan grocery list to incorporated healthier purchases, in daily life to improve health, loose weight, and to feel more confident and less guilty.    2) [name] would like to improve relationship with her daughter and granddaughter, through communicating with them daily or every other day, cleaning and organizing home so they are more comfortable visiting her and through monthly face to face interactions else where.   Observations/Objective: Counselor met with Client for individual therapy via Webex. Counselor assessed MH symptoms and progress on treatment plan goals, client stating that she is anxious about today being our last session and would like to focus on discharge planning. Client presents with moderate depression and moderate anxiety. Client denied suicidal ideation or self-harm behaviors. Client reports being behavorial health medication compliant 7 out of 7 days per week.     Goal 1: Counselor assessed implementation of daily health habits of managing diabetes, walking daily, meal prepping and making a grocery list for her weekly shopping trip. Client reports losing 23 lbs since May 2022, however in the past month client admits to veering from weight loss plan, choosing to make unhealthy choices, for comfort. Client reports that she plans to get  reorganized and focused on plan again. Client reports lacking confident and self-acceptance in current self -image due to dental issues and morbid obesity. Client remains disappointed and angry with how she has "let herself go" over the years. Counselor provided cognitive coping strategies in combating negative self-talk. Counselor shared psychoeducation on radical acceptance as a daily practice. Client willing to utilize skill.   Goal 2: Counselor assessed frequency of contact with daughter and process current relationship concerns at this time using CBT and MI interventions. Since the last session Client reports that she has talked with her daughter around once a week. Counselor followed up with the need to communicate financial concerns with daughter to brainstorm ideas on how to best meet needs moving forward. Client states that she has neglected to have the conversation thus far, but will work to remember discussing this week when she sees her daughter in person.   Counselor shared that today would be our last session, as Counselor with be ending time with [facility] effective October 1. Client was understanding and we discussed an appropriate transfer of care. Client to remain established with practice for counseling and psychiatry. Client aware of how to follow up and follow safety plan if needed. Counselor reflected on progress Client made and consistent engagement in treatment. Client expressed appreciation of therapeutic relationship and named takeaways from treatment.   Assessment and Plan: New Counselor will continue to meet with patient to address and reestablish treatment plan goals. Patient will continue to follow recommendations of providers and implement skills learned in session.   Follow Up Instructions: New Counselor will send information for next session via Webex.    The patient was advised to call   back or seek an in-person evaluation if the symptoms worsen or if the condition fails to  improve as anticipated.   The patient was advised to call back or seek an in-person evaluation if the symptoms worsen or if the condition fails to improve as anticipated.   I provided 60 minutes of non-face-to-face time during this encounter.      , LCSW 

## 2021-06-19 NOTE — Progress Notes (Signed)
Tiffany Reese DOB: [birth year] Encounter date: 06/19/2021  This is a 60 y.o. female who presents with Chief Complaint  Patient presents with   Follow-up     History of present illness: At last visit with pharmacy 03/27/21 tresiba decreased to 10 units with continued decrease per fasting sugars and now only on ozempic 0.5mg  weekly. Last A1C on 9/19 was 6.0 down from 10.2 03/08/21. Sugars are in the 100-120 range at home. Had lows on the tresiba; not since stopping. She feels good overall.   Has appointment 10/31 with Dr. Lavon Paganini re elevated liver enzymes. Her liver enzymes looked better on recent bloodwork with AST down from 76 to 51 and ALT down from 91 to 79.   Depression: mood is doing good overall. Her therapist is changing jobs so she has to find another therapist. Monday was last visit with her.   Overall eating has been pretty good. Had to add some carbs into diet. Has been cheating a little. She has been eating cinammon twirls. Doing slim fast in the morning; eating sandwich or salad, atkins or stouffers for dinner.  Back is tolerable with medications at home, but hard when out or with any walking. Had injection about a year ago; she isn't sure if it helped.   Allergies  Allergen Reactions   Bupropion Other (See Comments)    Causes agitation   Duloxetine Other (See Comments)    Causes involuntary movements   Sulfa Antibiotics    Metformin And Related     vomiting   Paxil [Paroxetine Hcl]     Insomnia; manic   Prozac [Fluoxetine Hcl]     Hyperactive, insomnia   Trintellix [Vortioxetine]     Triggers fibromyalgia   Zoloft [Sertraline Hcl]     Hyperactivity, insomnia   Current Meds  Medication Sig   ACCU-CHEK AVIVA PLUS test strip USE AS DIRECTED UP TO 4 TIMES A DAY   amitriptyline (ELAVIL) 50 MG tablet Take 1 tablet (50 mg total) by mouth at bedtime.   Blood Pressure Monitoring (BLOOD PRESSURE MONITOR/L CUFF) MISC 1 Device by Does not apply route daily.    cetirizine (ZYRTEC) 10 MG tablet TAKE 1 TABLET EVERY DAY   Cholecalciferol (VITAMIN D-3) 125 MCG (5000 UT) TABS Take 1 tablet by mouth daily.   diazepam (VALIUM) 2 MG tablet Take two to three tab as needed for anxiety   diclofenac (VOLTAREN) 75 MG EC tablet TAKE 1 TABLET (75 MG TOTAL) BY MOUTH 2 (TWO) TIMES DAILY AS NEEDED FOR MODERATE PAIN.   diclofenac Sodium (VOLTAREN) 1 % GEL Apply topically as needed.   Ferrous Sulfate (IRON) 325 (65 Fe) MG TABS Take 1 tablet by mouth daily.   furosemide (LASIX) 20 MG tablet TAKE 1 TABLET BY MOUTH EVERY DAY   levothyroxine (SYNTHROID) 50 MCG tablet TAKE 1 TABLET BY MOUTH EVERY DAY   lurasidone (LATUDA) 40 MG TABS tablet Take 1 tablet (40 mg total) by mouth daily with breakfast.   methocarbamol (ROBAXIN) 750 MG tablet TAKE 1 TABLET (750 MG TOTAL) BY MOUTH EVERY 8 (EIGHT) HOURS AS NEEDED FOR MUSCLE SPASMS.   Multiple Vitamins-Minerals (MULTIVITAMIN ADULT PO) Take 1 tablet by mouth daily.   Omega-3 Fatty Acids (FISH OIL PO) Take by mouth daily.   omeprazole (PRILOSEC) 40 MG capsule Take 1 capsule (40 mg total) by mouth daily. (Patient taking differently: Take 40 mg by mouth daily. Takes as needed in the pm)   pregabalin (LYRICA) 50 MG capsule TAKE 1 CAPSULE BY MOUTH  TWICE A DAY   Semaglutide,0.25 or 0.5MG /DOS, (OZEMPIC, 0.25 OR 0.5 MG/DOSE,) 2 MG/1.5ML SOPN Inject 0.5 mg into the skin once a week.    Review of Systems  Constitutional:  Negative for chills, fatigue and fever.  Respiratory:  Negative for cough, chest tightness, shortness of breath and wheezing.   Cardiovascular:  Negative for chest pain, palpitations and leg swelling.  Psychiatric/Behavioral:         Mood has been stable overall.   Objective:  BP 100/78 (BP Location: Left Arm, Patient Position: Sitting, Cuff Size: Large)   Pulse 84   Temp 98.5 F (36.9 C) (Oral)   Ht 5\' 6"  (1.676 m)   Wt 253 lb 3.2 oz (114.9 kg)   SpO2 96%   BMI 40.87 kg/m   Weight: 253 lb 3.2 oz (114.9 kg)   BP  Readings from Last 3 Encounters:  06/19/21 100/78  03/08/21 118/76  02/22/21 137/83   Wt Readings from Last 3 Encounters:  06/19/21 253 lb 3.2 oz (114.9 kg)  03/08/21 270 lb 1.6 oz (122.5 kg)  02/22/21 285 lb (129.3 kg)    Physical Exam Constitutional:      General: She is not in acute distress.    Appearance: She is well-developed.  HENT:     Head: Normocephalic and atraumatic.  Cardiovascular:     Rate and Rhythm: Normal rate and regular rhythm.     Heart sounds: Normal heart sounds. No murmur heard. Pulmonary:     Effort: Pulmonary effort is normal.     Breath sounds: Normal breath sounds.  Abdominal:     General: Bowel sounds are normal. There is no distension.     Palpations: Abdomen is soft.     Tenderness: There is no abdominal tenderness. There is no guarding.  Skin:    General: Skin is warm and dry.     Comments: Sensory exam of the foot is normal, tested with the monofilament. Good pulses, no lesions or ulcers, good peripheral pulses.  Psychiatric:        Judgment: Judgment normal.    Assessment/Plan 1. Primary hypertension Blood pressure today is very well controlled.  She is not currently on medications.  Ideally, we would have her on an ACE or ARB for renal protection, but I worry with her low blood pressure that she may not tolerate this well.  We will continue to monitor.  2. Need for immunization against influenza - Flu Vaccine QUAD 6+ mos PF IM (Fluarix Quad PF)  3. Need for vaccination against Streptococcus pneumoniae - Pneumococcal conjugate vaccine 20-valent (Prevnar 20)  4. Controlled type 2 diabetes mellitus with complication, without long-term current use of insulin (HCC) Significant improvement in blood sugar control with her dietary changes as well as medications that are been started.  We may have some slight bounce back since she has stopped the 04/24/21 and is no longer having low sugars.  We will plan to follow-up in 3 months and recheck A1c at  that time.  Continue to monitor at home, continue healthy eating, continue Ozempic at current dose (gave her option of cutting back, but she is happy with ongoing weight loss so would like to keep medications at current dosing).  Let me know if is getting any low sugars at home.  She is regularly following with pharmacology as well for sugar monitoring. - Microalbumin/Creatinine Ratio, Urine - HM DIABETES FOOT EXAM   Return in about 3 months (around 09/18/2021) for Chronic condition visit.  Micheline Rough, MD

## 2021-06-20 LAB — MICROALBUMIN / CREATININE URINE RATIO
Creatinine,U: 67.1 mg/dL
Microalb Creat Ratio: 1 mg/g (ref 0.0–30.0)
Microalb, Ur: <0.7 mg/dL (ref 0.0–1.9)

## 2021-07-10 ENCOUNTER — Other Ambulatory Visit: Payer: Self-pay | Admitting: Family Medicine

## 2021-07-22 ENCOUNTER — Ambulatory Visit: Payer: Medicare HMO | Admitting: Gastroenterology

## 2021-07-22 ENCOUNTER — Other Ambulatory Visit (HOSPITAL_COMMUNITY): Payer: Self-pay | Admitting: Psychiatry

## 2021-07-22 ENCOUNTER — Telehealth (HOSPITAL_COMMUNITY): Payer: Self-pay | Admitting: *Deleted

## 2021-07-22 DIAGNOSIS — F419 Anxiety disorder, unspecified: Secondary | ICD-10-CM

## 2021-07-22 DIAGNOSIS — F4310 Post-traumatic stress disorder, unspecified: Secondary | ICD-10-CM

## 2021-07-22 MED ORDER — DIAZEPAM 2 MG PO TABS: ORAL_TABLET | ORAL | 0 refills | Status: DC

## 2021-07-22 NOTE — Telephone Encounter (Signed)
Pt called requesting refill of Valium 2 mg. Per pt Pharmacy Rx last filled on 06/19/21. Pt has an upcoming appointment on 08/12/21. Please review and advise.

## 2021-07-22 NOTE — Telephone Encounter (Signed)
Done

## 2021-07-23 ENCOUNTER — Other Ambulatory Visit: Payer: Self-pay | Admitting: Family Medicine

## 2021-08-07 ENCOUNTER — Other Ambulatory Visit: Payer: Self-pay | Admitting: Family Medicine

## 2021-08-07 ENCOUNTER — Other Ambulatory Visit (HOSPITAL_COMMUNITY): Payer: Self-pay | Admitting: Psychiatry

## 2021-08-07 DIAGNOSIS — F319 Bipolar disorder, unspecified: Secondary | ICD-10-CM

## 2021-08-07 DIAGNOSIS — F4310 Post-traumatic stress disorder, unspecified: Secondary | ICD-10-CM

## 2021-08-07 DIAGNOSIS — M25572 Pain in left ankle and joints of left foot: Secondary | ICD-10-CM

## 2021-08-07 DIAGNOSIS — F419 Anxiety disorder, unspecified: Secondary | ICD-10-CM

## 2021-08-07 DIAGNOSIS — M797 Fibromyalgia: Secondary | ICD-10-CM

## 2021-08-12 ENCOUNTER — Encounter (HOSPITAL_COMMUNITY): Payer: Self-pay | Admitting: Psychiatry

## 2021-08-12 ENCOUNTER — Other Ambulatory Visit: Payer: Self-pay

## 2021-08-12 ENCOUNTER — Telehealth (HOSPITAL_BASED_OUTPATIENT_CLINIC_OR_DEPARTMENT_OTHER): Payer: Medicare HMO | Admitting: Psychiatry

## 2021-08-12 DIAGNOSIS — F419 Anxiety disorder, unspecified: Secondary | ICD-10-CM

## 2021-08-12 DIAGNOSIS — F319 Bipolar disorder, unspecified: Secondary | ICD-10-CM

## 2021-08-12 DIAGNOSIS — F4310 Post-traumatic stress disorder, unspecified: Secondary | ICD-10-CM | POA: Diagnosis not present

## 2021-08-12 MED ORDER — AMITRIPTYLINE HCL 50 MG PO TABS: 50.0000 mg | ORAL_TABLET | Freq: Every day | ORAL | 2 refills | Status: DC

## 2021-08-12 MED ORDER — LURASIDONE HCL 40 MG PO TABS: 40.0000 mg | ORAL_TABLET | Freq: Every day | ORAL | 2 refills | Status: DC

## 2021-08-12 MED ORDER — DIAZEPAM 2 MG PO TABS: ORAL_TABLET | ORAL | 2 refills | Status: DC
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
## 2021-08-14 ENCOUNTER — Telehealth: Payer: Self-pay | Admitting: Pharmacist

## 2021-08-14 NOTE — Chronic Care Management (AMB) (Signed)
    Chronic Care Management Pharmacy Assistant   Name: Tiffany Reese  MRN: [id number] DOB: 11/12/1960  08/14/21 APPOINTMENT REMINDER   Patient was reminded to have all medications, supplements and any blood glucose and blood pressure readings available for review with Gaylord Shih, Pharm. D, for telephone visit on 11/28 at 4:30.   Care Gaps: BP- 100/78 (06/19/21) AWV- MSG sent to Carrie Mew CMA to schedule. PAP S.- Overdue TDAP - Overdue COVID Booster#3 - Overdue Lab Results  Component Value Date   HGBA1C 6.0 06/10/2021     Star Rating Drug: Semaglutide (Ozempic) 0.5 mg - Last filled 07/27/2021 90 DS at [facility]   Any gaps in medications fill history? None     Medications: Outpatient Encounter Medications as of 08/14/2021  Medication Sig   pregabalin (LYRICA) 50 MG capsule TAKE 1 CAPSULE BY MOUTH TWICE A DAY   ACCU-CHEK AVIVA PLUS test strip USE AS DIRECTED UP TO 4 TIMES A DAY   amitriptyline (ELAVIL) 50 MG tablet Take 1 tablet (50 mg total) by mouth at bedtime.   Blood Pressure Monitoring (BLOOD PRESSURE MONITOR/L CUFF) MISC 1 Device by Does not apply route daily.   cetirizine (ZYRTEC) 10 MG tablet TAKE 1 TABLET EVERY DAY   Cholecalciferol (VITAMIN D-3) 125 MCG (5000 UT) TABS Take 1 tablet by mouth daily.   diazepam (VALIUM) 2 MG tablet Take two to three tab as needed for anxiety   diclofenac (VOLTAREN) 75 MG EC tablet TAKE 1 TABLET (75 MG TOTAL) BY MOUTH 2 (TWO) TIMES DAILY AS NEEDED FOR MODERATE PAIN.   diclofenac Sodium (VOLTAREN) 1 % GEL Apply topically as needed.   Ferrous Sulfate (IRON) 325 (65 Fe) MG TABS Take 1 tablet by mouth daily.   furosemide (LASIX) 20 MG tablet TAKE 1 TABLET BY MOUTH EVERY DAY   levothyroxine (SYNTHROID) 50 MCG tablet TAKE 1 TABLET BY MOUTH EVERY DAY   lurasidone (LATUDA) 40 MG TABS tablet Take 1 tablet (40 mg total) by mouth daily with breakfast.   methocarbamol (ROBAXIN) 750 MG tablet TAKE 1 TABLET (750 MG TOTAL) BY MOUTH EVERY 8  (EIGHT) HOURS AS NEEDED FOR MUSCLE SPASMS.   Multiple Vitamins-Minerals (MULTIVITAMIN ADULT PO) Take 1 tablet by mouth daily.   Omega-3 Fatty Acids (FISH OIL PO) Take by mouth daily.   omeprazole (PRILOSEC) 40 MG capsule Take 1 capsule (40 mg total) by mouth daily. (Patient taking differently: Take 40 mg by mouth daily. Takes as needed in the pm)   Semaglutide,0.25 or 0.5MG /DOS, (OZEMPIC, 0.25 OR 0.5 MG/DOSE,) 2 MG/1.5ML SOPN Inject 0.5 mg into the skin once a week.   No facility-administered encounter medications on file as of 08/14/2021.     Pamala Duffel CMA Clinical Pharmacist Assistant [phone]

## 2021-08-19 ENCOUNTER — Ambulatory Visit (INDEPENDENT_AMBULATORY_CARE_PROVIDER_SITE_OTHER): Payer: Medicare HMO | Admitting: Pharmacist

## 2021-08-19 DIAGNOSIS — F339 Major depressive disorder, recurrent, unspecified: Secondary | ICD-10-CM

## 2021-08-19 DIAGNOSIS — E118 Type 2 diabetes mellitus with unspecified complications: Secondary | ICD-10-CM

## 2021-08-19 NOTE — Progress Notes (Signed)
Chronic Care Management Pharmacy Note   Name:  Tiffany Reese MRN:  [id number] DOB:  12-25-1960  Summary: A1c at goal < 7 FBGs are at goal 80-130 and post prandial BGs are at goal < 180  Recommendations/Changes made from today's visit: -Recommend moderate intensity statin after resolution of elevated LFTs -Recommended exercise for mood/anxiety benefits and blood sugar lowering -Provided telephone number for [place]: Follow up in 4 months   Subjective: Tiffany Reese is an 60 y.o. year old female who is a primary patient of Koberlein, Steele Berg, MD.  The CCM team was consulted for assistance with disease management and care coordination needs.    Engaged with patient by telephone for follow up visit in response to provider referral for pharmacy case management and/or care coordination services.   Consent to Services:  The patient was given information about Chronic Care Management services, agreed to services, and gave verbal consent prior to initiation of services.  Please see initial visit note for detailed documentation.   Patient Care Team: Caren Macadam, MD as PCP - General (Family Medicine) Viona Gilmore, [facility] as Pharmacist (Pharmacist)  Recent office visits: 06/19/21 Micheline Rough, MD: Patient presented for diabetes follow up. A1c decreased to 6.0%. Administered influenza vaccine and Prevnar 20.  03/27/21 Telephone encounter: Recommended decreasing Tyler Aas to 10 units and can decrease further depending on AM blood sugars.  03/13/21 Telephone encounter: Recommended decreasing Tyler Aas to 20 units when increasing Ozempic to 0.5 mg weekly.  02/22/21 Micheline Rough, MD: Patient presented for diabetes follow up. A1c increased to 10.2%. Recommended increasing Tresiba to 30 units daily.  Recent consult visits: 08/12/21 Berniece Andreas, MD (behavioral health): Patient presented for PTSD and anxiety follow up. Recommended therapy. No  medication changes.  06/17/21 [name], LCSW (behavioral health): Patient presented for counseling follow up.  05/13/21 Berniece Andreas, MD (behavioral health): Patient presented for PTSD and anxiety follow up. No medication changes.  04/08/21 [name], LCSW (behavioral health): Patient presented for counseling follow up.  04/03/21 Ila Mcgill, DPM: Patient presented for ingrown toenail removal.  03/13/21 [name], LCSW (behavioral health): Patient presented for counseling follow up.  03/11/21 Berniece Andreas, MD (behavioral health): Patient presented for PTSD and anxiety follow up. Decreased Latuda due to elevated liver enzymes.  02/28/21 Behavioral health telephone encounter: Recommended for patient to trial melatonin 5 mg as liver enzymes are still elevated.  02/27/21 [name], LCSW (behavioral health): Patient presented for counseling follow up.   02/22/21 Patient presented for colonoscopy.  12/17/20 Amy Easterwood, PA-C (gastro): Patient presented for evaluation of LFTs. Plan to repeat labs and for colonoscopy.  Hospital visits: None in previous 6 months   Objective:  Lab Results  Component Value Date   CREATININE 1.13 06/10/2021   BUN 21 06/10/2021   GFR 53.82 (L) 06/10/2021   GFRNONAA >60 11/05/2018   GFRAA >60 11/05/2018   NA 140 06/10/2021   K 4.0 06/10/2021   CALCIUM 10.2 06/10/2021   CO2 29 06/10/2021   GLUCOSE 90 06/10/2021    Lab Results  Component Value Date/Time   HGBA1C 6.0 06/10/2021 02:25 PM   HGBA1C 10.2 (A) 03/08/2021 04:57 PM   HGBA1C 7.6 (H) 10/31/2020 03:47 PM   GFR 53.82 (L) 06/10/2021 02:25 PM   GFR 59.71 (L) 10/31/2020 03:47 PM   MICROALBUR <0.7 06/19/2021 04:05 PM   MICROALBUR 1.1 02/27/2020 12:54 PM    Last diabetic Eye exam:  Lab Results  Component Value Date/Time  HMDIABEYEEXA No Retinopathy 07/18/2019 12:00 AM    Last diabetic Foot exam: No results found for: HMDIABFOOTEX   Lab Results  Component Value Date   CHOL 203  (H) 06/10/2021   HDL 53.20 06/10/2021   LDLCALC 115 (H) 06/10/2021   TRIG 174.0 (H) 06/10/2021   CHOLHDL 4 06/10/2021    Hepatic Function Latest Ref Rng & Units 06/10/2021 03/22/2021 02/01/2021  Total Protein 6.0 - 8.3 g/dL 8.6(H) 7.7 8.3  Albumin 3.5 - 5.2 g/dL 4.8 - 4.7  AST 0 - 37 U/L 51(H) 76(H) 94(H)  ALT 0 - 35 U/L 79(H) 91(H) 125(H)  Alk Phosphatase 39 - 117 U/L 147(H) - 179(H)  Total Bilirubin 0.2 - 1.2 mg/dL 0.5 0.5 0.6  Bilirubin, Direct 0.0 - 0.2 mg/dL - 0.1 0.1    Lab Results  Component Value Date/Time   TSH 2.71 10/31/2020 03:47 PM   TSH 1.89 02/27/2020 12:54 PM    CBC Latest Ref Rng & Units 10/31/2020 02/27/2020 02/07/2019  WBC 4.0 - 10.5 K/uL 7.8 7.3 9.1  Hemoglobin 12.0 - 15.0 g/dL 14.3 13.2 12.4  Hematocrit 36.0 - 46.0 % 43.0 39.7 37.3  Platelets 150.0 - 400.0 K/uL 212.0 210.0 251.0    Lab Results  Component Value Date/Time   VD25OH 49.43 02/27/2020 12:54 PM   VD25OH 32.38 11/01/2018 11:12 AM    Clinical ASCVD: No  The 10-year ASCVD risk score (Arnett DK, et al., 2019) is: 4.4%   Values used to calculate the score:     Age: 32 years     Sex: Female     Is Non-Hispanic African American: No     Diabetic: Yes     Tobacco smoker: No     Systolic Blood Pressure: 119 mmHg     Is BP treated: Yes     HDL Cholesterol: 53.2 mg/dL     Total Cholesterol: 203 mg/dL    Depression screen [facility] 2/9 05/20/2021 03/20/2021 03/12/2021  Decreased Interest '1 1 1  ' Down, Depressed, Hopeless '1 1 1  ' PHQ - 2 Score '2 2 2  ' Altered sleeping 1 0 1  Tired, decreased energy '1 1 3  ' Change in appetite 0 1 0  Feeling bad or failure about yourself  '1 1 1  ' Trouble concentrating 1 1 0  Moving slowly or fidgety/restless 0 0 -  Suicidal thoughts 1 0 0  PHQ-9 Score '7 6 7  ' Difficult doing work/chores Extremely dIfficult Somewhat difficult -  Some recent data might be hidden      Social History   Tobacco Use  Smoking Status Former   Types: Cigarettes  Smokeless Tobacco Never  Tobacco  Comments   only smoked a couple of years   BP Readings from Last 3 Encounters:  06/19/21 100/78  03/08/21 118/76  02/22/21 137/83   Pulse Readings from Last 3 Encounters:  06/19/21 84  03/08/21 86  02/22/21 88   Wt Readings from Last 3 Encounters:  08/12/21 245 lb (111.1 kg)  06/19/21 253 lb 3.2 oz (114.9 kg)  03/11/21 270 lb (122.5 kg)   BMI Readings from Last 3 Encounters:  08/12/21 39.54 kg/m  06/19/21 40.87 kg/m  03/11/21 42.93 kg/m    Assessment/Interventions: Review of patient past medical history, allergies, medications, health status, including review of consultants reports, laboratory and other test data, was performed as part of comprehensive evaluation and provision of chronic care management services.   SDOH:  (Social Determinants of Health) assessments and interventions performed: No  SDOH Screenings  Alcohol Screen: Not on file  Depression (PHQ2-9): Medium Risk   PHQ-2 Score: 7  Financial Resource Strain: Not on file  Food Insecurity: Not on file  Housing: Not on file  Physical Activity: Not on file  Social Connections: Not on file  Stress: Not on file  Tobacco Use: Medium Risk   Smoking Tobacco Use: Former   Smokeless Tobacco Use: Never   Passive Exposure: Not on file  Transportation Needs: Not on file    [place]  Allergies  Allergen Reactions   Bupropion Other (See Comments)    Causes agitation   Duloxetine Other (See Comments)    Causes involuntary movements   Sulfa Antibiotics    Metformin And Related     vomiting   Paxil [Paroxetine Hcl]     Insomnia; manic   Prozac [Fluoxetine Hcl]     Hyperactive, insomnia   Trintellix [Vortioxetine]     Triggers fibromyalgia   Zoloft [Sertraline Hcl]     Hyperactivity, insomnia    Medications Reviewed Today     Reviewed by [name], MD (Psychiatrist) on 08/12/21 at [place] List Status: <None>   Medication Order Taking? Sig Documenting Provider Last Dose Status Informant   ACCU-CHEK AVIVA PLUS test strip [id number] No USE AS DIRECTED UP TO 4 TIMES A DAY Lucretia Kern, DO Taking Active   amitriptyline (ELAVIL) 50 MG tablet [id number] No Take 1 tablet (50 mg total) by mouth at bedtime. [name], MD Taking Active   Blood Pressure Monitoring (BLOOD PRESSURE MONITOR/L CUFF) MISC [id number] No 1 Device by Does not apply route daily. Caren Macadam, MD Taking Active   cetirizine (ZYRTEC) 10 MG tablet [id number] No TAKE 1 TABLET EVERY DAY Koberlein, Junell C, MD Taking Active   Cholecalciferol (VITAMIN D-3) 125 MCG (5000 UT) TABS [id number] No Take 1 tablet by mouth daily. [provider] Taking Active   diazepam (VALIUM) 2 MG tablet [id number]  Take two to three tab as needed for anxiety Arfeen, Arlyce Harman, MD  Active   diclofenac (VOLTAREN) 75 MG EC tablet [id number]  TAKE 1 TABLET (75 MG TOTAL) BY MOUTH 2 (TWO) TIMES DAILY AS NEEDED FOR MODERATE PAIN. Caren Macadam, MD  Active   diclofenac Sodium (VOLTAREN) 1 % GEL [id number] No Apply topically as needed. [provider] Taking Active   Ferrous Sulfate (IRON) 325 (65 Fe) MG TABS [id number] No Take 1 tablet by mouth daily. [provider] Taking Active Self  furosemide (LASIX) 20 MG tablet [id number]  TAKE 1 TABLET BY MOUTH EVERY DAY Koberlein, Junell C, MD  Active   levothyroxine (SYNTHROID) 50 MCG tablet [id number]  TAKE 1 TABLET BY MOUTH EVERY DAY Koberlein, Junell C, MD  Active   lurasidone (LATUDA) 40 MG TABS tablet [id number] No Take 1 tablet (40 mg total) by mouth daily with breakfast. Arfeen, Arlyce Harman, MD Taking Active   methocarbamol (ROBAXIN) 750 MG tablet [id number] No TAKE 1 TABLET (750 MG TOTAL) BY MOUTH EVERY 8 (EIGHT) HOURS AS NEEDED FOR MUSCLE SPASMS. Caren Macadam, MD Taking Active   Multiple Vitamins-Minerals (MULTIVITAMIN ADULT PO) [id number] No Take 1 tablet by mouth daily. [provider] Taking Active Self  Omega-3 Fatty Acids (FISH OIL PO) [id number] No Take by  mouth daily. [provider] Taking Active   omeprazole (PRILOSEC) 40 MG capsule [id number] No Take 1 capsule (40 mg total) by mouth daily.  Patient taking differently: Take 40 mg by mouth daily. Takes as  needed in the pm   Koberlein, Steele Berg, MD Taking Active   pregabalin (LYRICA) 50 MG capsule [id number]  TAKE 1 CAPSULE BY MOUTH TWICE A DAY Koberlein, Junell C, MD  Active   Semaglutide,0.25 or 0.5MG/DOS, (OZEMPIC, 0.25 OR 0.5 MG/DOSE,) 2 MG/1.5ML SOPN [id number] No Inject 0.5 mg into the skin once a week. Caren Macadam, MD Taking Active             Patient Active Problem List   Diagnosis Date Noted   Elevated d-dimer 11/16/2018   At risk for adverse drug reaction 11/10/2018   GERD (gastroesophageal reflux disease)    Hypertension    Diabetes mellitus type II, controlled ([place])    PTSD (post-traumatic stress disorder) 11/01/2018   Anxiety 11/01/2018   Depression, recurrent ([place]) 11/01/2018   Closed left ankle fracture 11/01/2018   Anemia 11/01/2018   Hypothyroid 11/01/2018   Fatty liver disease, nonalcoholic 08/65/7846   BMI 45.0-49.9, adult ([place]) 04/29/2018   DDD (degenerative disc disease), thoracolumbar 04/29/2018    Immunization History  Administered Date(s) Administered   Influenza,inj,Quad PF,6+ Mos 06/22/2018, 06/19/2021   Influenza-Unspecified 07/19/2020   Janssen (J&J) SARS-COV-2 Vaccination 01/25/2020, 07/19/2020   Moderna SARS-COV2 Booster Vaccination 03/14/2021   PNEUMOCOCCAL CONJUGATE-20 06/19/2021   Zoster Recombinat (Shingrix) 05/05/2019, 07/18/2019   Patient's dog Mimi has been going to the bathroom more often. Patient has been more depressed lately and her anxiety is worse.   Conditions to be addressed/monitored:  Hypertension, Hyperlipidemia, Diabetes, GERD, Depression, Anxiety and Allergic Rhinitis  Conditions addressed this visit: Diabetes, Depression  Patient Care Plan: CCM Pharmacy Care Plan     Problem Identified: Problem:  Hypertension, Hyperlipidemia, Diabetes, GERD, Depression, Anxiety and Allergic Rhinitis      Long-Range Goal: Patient-Specific Goal   Start Date: 02/20/2021  Expected End Date: 02/20/2022  Recent Progress: On track  Priority: High  Note:   Current Barriers:  Unable to independently monitor therapeutic efficacy Suboptimal therapeutic regimen for cholesterol  Pharmacist Clinical Goal(s):  Patient will maintain control of diabetes as evidenced by A1c and blood sugars  through collaboration with PharmD and provider.   Interventions: 1:1 collaboration with Caren Macadam, MD regarding development and update of comprehensive plan of care as evidenced by provider attestation and co-signature Inter-disciplinary care team collaboration (see longitudinal plan of care) Comprehensive medication review performed; medication list updated in electronic medical record  Hypertension (BP goal <130/80) -Not ideally controlled -Current treatment: Furosemide 20 mg 1 tablet daily -Medications previously tried: none  -Current home readings: did not provide -Current dietary habits: did not discuss -Current exercise habits: did not discuss -Denies hypotensive/hypertensive symptoms -Educated on Exercise goal of 150 minutes per week; Importance of home blood pressure monitoring; -Counseled to monitor BP at home weekly, document, and provide log at future appointments -Counseled on diet and exercise extensively Recommended to continue current medication  Hyperlipidemia: (LDL goal < 100) -Uncontrolled -Current treatment: No medications -Medications previously tried: none  -Current dietary patterns: did not discuss -Current exercise habits: did not discuss -Educated on Cholesterol goals;  Benefits of statin for ASCVD risk reduction; Importance of limiting foods high in cholesterol; Exercise goal of 150 minutes per week; -Counseled on diet and exercise extensively Recommended moderate intensity  statin therapy based on DM diagnosis.  Diabetes (A1c goal <7%) -Controlled  -Current medications: Ozempic inject 0.5 mg once weekly -Medications previously tried: [place] (hypoglycemia) -Current home glucose readings fasting glucose: 113, 117 (Friday)  post prandial glucose: n/a -Reports hypoglycemic/hyperglycemic symptoms -Current meal  patterns:  breakfast: slim fast lunch: did not discuss  dinner: salisbury steak and mashed potatoes; reuben and fries snacks: did not discuss drinks: no more soda -Current exercise: has not started yet -Educated on A1c and blood sugar goals; Prevention and management of hypoglycemic episodes; Benefits of routine self-monitoring of blood sugar; Carbohydrate counting and/or plate method -Counseled to check feet daily and get yearly eye exams -Counseled on diet and exercise extensively Recommended to continue current medication.  Depression/Anxiety (Goal: minimize symptoms) -Not ideally controlled -Current treatment: Lurasidone 40 mg 1 tablet daily with breakfast Amitriptyline 50 mg 1 tablet daily at bedtime Diazepam 2 mg 2-3 tablets as needed for anxiety -Medications previously tried/failed: n/a -PHQ9: 9 -GAD7: n/a -Educated on Benefits of medication for symptom control Benefits of cognitive-behavioral therapy with or without medication -Recommended to continue current medication Recommended exercise to help with mood. Provided telephone number for [facility].  GERD (Goal: minimize symptoms) -Controlled -Current treatment  Omeprazole 40 mg 1 capsule twice daily -Medications previously tried: Famotidine (side effects)  -Recommended to continue current medication Counseled on non-pharmacologic management of symptoms such as elevating the head of your bed, avoiding eating 2-3 hours before bed, avoiding triggering foods such as acidic, spicy, or fatty foods, eating smaller meals, and wearing clothes that are loose around the  waist  Hypothyroidism (Goal: TSH 0.35-4.5) -Controlled -Current treatment  Levothyroxine 50 mcg 1 tablet daily -Medications previously tried: none  -Recommended to continue current medication  Pain/muscle spasms (Goal: minimize pain) -Controlled -Current treatment  Pregabalin 50 mg 1 capsule twice daily Methocarbamol 750 mg 1 tablet every 8 hours as needed Diclofenac 75 mg 1 tablet twice daily as needed Diclofenac gel 1% apply topically as needed -Medications previously tried: n/a  -Recommended to continue current medication  Allergic rhinitis (Goal: minimize symptoms) -Controlled -Current treatment  Cetirizine 10 mg 1 tablet daily -Medications previously tried: none  -Recommended to continue current medication   Health Maintenance -Vaccine gaps: shingrix, COVID booster, Pneumovax -Current therapy:  Multivitamin 1 tablet daily Ferrous sulfate 325 mg 1 tablet daily Pepto bismol as needed -Educated on Cost vs benefit of each product must be carefully weighed by individual consumer -Patient is satisfied with current therapy and denies issues -Recommended to continue current medication  Patient Goals/Self-Care Activities Patient will:  - take medications as prescribed check glucose 3 times daily, document, and provide at future appointments engage in dietary modifications by decreasing carb intake  Follow Up Plan: Telephone follow up appointment with care management team member scheduled for:4 months        Medication Assistance: None required.  Patient affirms current coverage meets needs.  Compliance/Adherence/Medication fill history: Care Gaps: Shingrix, prevnar, covid booster, urine microalbumin, foot exam, pap smear, mammogram, Pneumovax A1c: 6.0 (06/10/21) BP- 100/78 (06/19/21)  Star-Rating Drugs: Semaglutide (Ozempic) 0.5 mg - Last [facility]/2021 [facility]  [facility]  [id number]Phone: [phone]: [phone] Uses pill box? No - did not ask Pt endorses 90% compliance  We discussed: Current pharmacy is preferred with insurance plan and patient is satisfied with pharmacy services Patient decided to: Continue current medication management strategy  Care Plan and Follow Up Patient Decision:  Patient agrees to Care Plan and Follow-up.  Plan: Telephone follow up appointment with care management team member scheduled for:  4 months  MJeni Salles PharmD BVandenberg AFBPharmacist LNew Underwoodat [id number] [phone]

## 2021-08-21 DIAGNOSIS — F339 Major depressive disorder, recurrent, unspecified: Secondary | ICD-10-CM | POA: Diagnosis not present

## 2021-08-21 DIAGNOSIS — Z87891 Personal history of nicotine dependence: Secondary | ICD-10-CM | POA: Diagnosis not present

## 2021-08-21 DIAGNOSIS — E118 Type 2 diabetes mellitus with unspecified complications: Secondary | ICD-10-CM

## 2021-08-21 DIAGNOSIS — Z7985 Long-term (current) use of injectable non-insulin antidiabetic drugs: Secondary | ICD-10-CM | POA: Diagnosis not present

## 2021-08-21 DIAGNOSIS — I10 Essential (primary) hypertension: Secondary | ICD-10-CM

## 2021-08-27 ENCOUNTER — Ambulatory Visit: Payer: Medicare HMO

## 2021-09-06 DIAGNOSIS — E119 Type 2 diabetes mellitus without complications: Secondary | ICD-10-CM | POA: Diagnosis not present

## 2021-09-16 ENCOUNTER — Other Ambulatory Visit: Payer: Self-pay | Admitting: Family Medicine

## 2021-09-16 DIAGNOSIS — G8929 Other chronic pain: Secondary | ICD-10-CM

## 2021-09-18 ENCOUNTER — Ambulatory Visit: Payer: Medicare HMO | Admitting: Family Medicine

## 2021-10-03 ENCOUNTER — Other Ambulatory Visit (HOSPITAL_COMMUNITY): Payer: Self-pay | Admitting: Psychiatry

## 2021-10-03 DIAGNOSIS — F419 Anxiety disorder, unspecified: Secondary | ICD-10-CM

## 2021-10-03 DIAGNOSIS — F4310 Post-traumatic stress disorder, unspecified: Secondary | ICD-10-CM

## 2021-10-04 ENCOUNTER — Ambulatory Visit: Payer: Medicare HMO | Admitting: Gastroenterology

## 2021-10-04 ENCOUNTER — Telehealth: Payer: Self-pay | Admitting: Gastroenterology

## 2021-10-04 NOTE — Telephone Encounter (Signed)
Patient called states she is not feeling well and she is not able to attend the appt for today but she is requesting to speak with a nurse to get advise on what to do said she feels dizzy and weak.

## 2021-10-04 NOTE — Telephone Encounter (Signed)
Spoke with the patient. Complains of vomiting and diarrhea that started last night. She states she last vomited at 4 am. No vomiting or diarrhea since. Very fatigued and has a headache. She is fasting. Afebrile. Patient is on Ozempic injections. Discussed re-introduction of PO fluids. Stay on clears for today and advance as tolerated. If she acutely worsens or fails to improve, she will seek medical care. Appointment rescheduled to 11/19/21. Patient declines sooner appointment because she needs an afternoon visit.

## 2021-10-14 ENCOUNTER — Other Ambulatory Visit: Payer: Self-pay | Admitting: Family Medicine

## 2021-10-14 ENCOUNTER — Telehealth: Payer: Self-pay | Admitting: Family Medicine

## 2021-10-14 DIAGNOSIS — M25572 Pain in left ankle and joints of left foot: Secondary | ICD-10-CM

## 2021-10-14 NOTE — Telephone Encounter (Signed)
Pt call and stated she need a refill on diclofenac (VOLTAREN) 75 MG EC tablet  sent to  [id number] [place], [place] - [address] Phone:  [phone]  Fax:  [phone]

## 2021-10-14 NOTE — Telephone Encounter (Signed)
I don't have anything scheduled with her until April. Are you ok with waiting until then? I will ask her about Celebrex at that point and see what she thinks about trying that.

## 2021-10-14 NOTE — Telephone Encounter (Signed)
Rx previously sent by PCP. °

## 2021-10-14 NOTE — Telephone Encounter (Signed)
I am refilling med; but I would really like her to follow up with specialist to make sure they are addressing all other treatment options because I worry about her stomach health taking regular anti-inflammatories.

## 2021-10-16 ENCOUNTER — Other Ambulatory Visit: Payer: Self-pay | Admitting: Family Medicine

## 2021-10-17 NOTE — Telephone Encounter (Signed)
Please let patient know I did refill her diclofenac.I would like her to try and use sparingly as this can be harsh on stomach especially. When she is due for a refill; please have her let me know. I talked with Maddie (our pharmacist) about changing to a different med if she is still needing on a regular basis, so Tiffany Reese and I can talk through this at her next visit (or sooner) when she needs her next refill.

## 2021-10-17 NOTE — Telephone Encounter (Signed)
Spoke with the patient and informed her of the message below.  Patient stated she is needing to take this due to having a lot of pain and I offered to schedule an appt.  Patient stated she is in the restroom at this time and will call back for an appt.

## 2021-10-23 DIAGNOSIS — H532 Diplopia: Secondary | ICD-10-CM | POA: Diagnosis not present

## 2021-10-23 DIAGNOSIS — E119 Type 2 diabetes mellitus without complications: Secondary | ICD-10-CM | POA: Diagnosis not present

## 2021-10-23 DIAGNOSIS — H524 Presbyopia: Secondary | ICD-10-CM | POA: Diagnosis not present

## 2021-10-23 DIAGNOSIS — H2513 Age-related nuclear cataract, bilateral: Secondary | ICD-10-CM | POA: Diagnosis not present

## 2021-10-23 DIAGNOSIS — Z0100 Encounter for examination of eyes and vision without abnormal findings: Secondary | ICD-10-CM | POA: Diagnosis not present

## 2021-10-23 DIAGNOSIS — H5010 Unspecified exotropia: Secondary | ICD-10-CM | POA: Diagnosis not present

## 2021-10-23 LAB — HM DIABETES EYE EXAM

## 2021-10-24 ENCOUNTER — Telehealth: Payer: Self-pay | Admitting: Family Medicine

## 2021-10-24 ENCOUNTER — Encounter: Payer: Self-pay | Admitting: Family Medicine

## 2021-10-24 NOTE — Telephone Encounter (Signed)
Left message for patient to call back and schedule Medicare Annual Wellness Visit (AWV) either virtually or in office. Left  my jabber number [phone] ° ° °AWV-I per [place] 04/22/18 ° please schedule at anytime with [facility] Nurse Health Advisor 1 or 2 ° ° °This should be a 45 minute visit.  °

## 2021-11-01 ENCOUNTER — Telehealth: Payer: Self-pay | Admitting: Pharmacist

## 2021-11-01 NOTE — Chronic Care Management (AMB) (Signed)
Chronic Care Management Pharmacy Assistant   Name: Tiffany Reese  MRN: [id number] DOB: 05-Aug-1961  Reason for Encounter: Disease State / Diabetes Assessment   Conditions to be addressed/monitored: DMII  Recent office visits:  None   Recent consult visits:  None  Hospital visits:  None in previous 6 months  Medications: Outpatient Encounter Medications as of 11/01/2021  Medication Sig   pregabalin (LYRICA) 50 MG capsule TAKE 1 CAPSULE BY MOUTH TWICE A DAY   ACCU-CHEK AVIVA PLUS test strip USE AS DIRECTED UP TO 4 TIMES A DAY   amitriptyline (ELAVIL) 50 MG tablet Take 1 tablet (50 mg total) by mouth at bedtime.   Blood Pressure Monitoring (BLOOD PRESSURE MONITOR/L CUFF) MISC 1 Device by Does not apply route daily.   cetirizine (ZYRTEC) 10 MG tablet TAKE 1 TABLET EVERY DAY   Cholecalciferol (VITAMIN D-3) 125 MCG (5000 UT) TABS Take 1 tablet by mouth daily.   diazepam (VALIUM) 2 MG tablet Take two to three tab as needed for anxiety   diclofenac (VOLTAREN) 75 MG EC tablet TAKE 1 TABLET (75 MG TOTAL) BY MOUTH 2 (TWO) TIMES DAILY AS NEEDED FOR MODERATE PAIN.   diclofenac Sodium (VOLTAREN) 1 % GEL Apply topically as needed.   Ferrous Sulfate (IRON) 325 (65 Fe) MG TABS Take 1 tablet by mouth daily.   furosemide (LASIX) 20 MG tablet TAKE 1 TABLET BY MOUTH EVERY DAY   levothyroxine (SYNTHROID) 50 MCG tablet TAKE 1 TABLET BY MOUTH EVERY DAY   lurasidone (LATUDA) 40 MG TABS tablet Take 1 tablet (40 mg total) by mouth daily with breakfast.   methocarbamol (ROBAXIN) 750 MG tablet TAKE 1 TABLET EVERY 8 HOURS AS NEEDED FOR MUSCLE SPASMS   Multiple Vitamins-Minerals (MULTIVITAMIN ADULT PO) Take 1 tablet by mouth daily.   Omega-3 Fatty Acids (FISH OIL PO) Take by mouth daily.   omeprazole (PRILOSEC) 40 MG capsule Take 1 capsule (40 mg total) by mouth daily. (Patient taking differently: Take 40 mg by mouth daily. Takes as needed in the pm)   Semaglutide,0.25 or 0.5MG /DOS, (OZEMPIC, 0.25 OR  0.5 MG/DOSE,) 2 MG/1.5ML SOPN Inject 0.5 mg into the skin once a week.   No facility-administered encounter medications on file as of 11/01/2021.  Recent Relevant Labs: Lab Results  Component Value Date/Time   HGBA1C 6.0 06/10/2021 02:25 PM   HGBA1C 10.2 (A) 03/08/2021 04:57 PM   HGBA1C 7.6 (H) 10/31/2020 03:47 PM   MICROALBUR <0.7 06/19/2021 04:05 PM   MICROALBUR 1.1 02/27/2020 12:54 PM    Kidney Function Lab Results  Component Value Date/Time   CREATININE 1.13 06/10/2021 02:25 PM   CREATININE 1.04 10/31/2020 03:47 PM   GFR 53.82 (L) 06/10/2021 02:25 PM   GFRNONAA >60 11/05/2018 05:11 PM   GFRAA >60 11/05/2018 05:11 PM  11/01/21  Current antihyperglycemic regimen:  Ozempic inject 0.5 mg once weekly What recent interventions/DTPs have been made to improve glycemic control:  Patient reports no changes Have there been any recent hospitalizations or ED visits since last visit with CPP? No Patient denies hypoglycemic symptoms, including None Patient denies hyperglycemic symptoms, including none How often are you checking your blood sugar? Patient reports she has not checked for several weeks reports she has been feeling fine so did not want to stick herself. Counseled that she should be checking at least a few times a week to be sure that her sugars are where they should be otherwise she may not know. She voiced understanding, stated that she also  has not yet gotten her Ozempic from the pharmacy yet. Advised her I would check back in with her in a week or so as she will start checking during that time What are your blood sugars ranging?  During the week, how often does your blood glucose drop below 70? Never   Adherence Review: Is the patient currently on a STATIN medication? No Is the patient currently on ACE/ARB medication? No Does the patient have >5 day gap between last estimated fill dates? Yes  11/08/21 Call to patient she reports she has not yet picked up her Ozempic or  checked her blood sugars says she can try to do both over the weekend.  11/18/21 Call to patient in ref to the above did not reach left voincemail  Care Gaps: HIV Screening - Overdue Tetanus - Overdue PAP Smear - Overdue Mammogram - Overdue COVID Booster - Overdue BP- 100/78 ( 06/19/21) AWV- office aware to sched (10/24/21) CCM- 4/23 Lab Results  Component Value Date   HGBA1C 6.0 06/10/2021    Star Rating Drugs: Semaglutide (Ozempic) 0.5 mg - Last filled 11/13/21 90 DS at [facility] Pharmacist Assistant [phone]

## 2021-11-05 ENCOUNTER — Other Ambulatory Visit (HOSPITAL_COMMUNITY): Payer: Self-pay | Admitting: Psychiatry

## 2021-11-05 DIAGNOSIS — F419 Anxiety disorder, unspecified: Secondary | ICD-10-CM

## 2021-11-05 DIAGNOSIS — F4310 Post-traumatic stress disorder, unspecified: Secondary | ICD-10-CM

## 2021-11-12 ENCOUNTER — Telehealth (HOSPITAL_BASED_OUTPATIENT_CLINIC_OR_DEPARTMENT_OTHER): Payer: Medicare HMO | Admitting: Psychiatry

## 2021-11-12 ENCOUNTER — Other Ambulatory Visit: Payer: Self-pay

## 2021-11-12 ENCOUNTER — Encounter (HOSPITAL_COMMUNITY): Payer: Self-pay | Admitting: Psychiatry

## 2021-11-12 DIAGNOSIS — F319 Bipolar disorder, unspecified: Secondary | ICD-10-CM

## 2021-11-12 DIAGNOSIS — F4310 Post-traumatic stress disorder, unspecified: Secondary | ICD-10-CM | POA: Diagnosis not present

## 2021-11-12 DIAGNOSIS — F419 Anxiety disorder, unspecified: Secondary | ICD-10-CM

## 2021-11-12 MED ORDER — AMITRIPTYLINE HCL 50 MG PO TABS: 50.0000 mg | ORAL_TABLET | Freq: Every day | ORAL | 2 refills | Status: DC

## 2021-11-12 MED ORDER — LURASIDONE HCL 40 MG PO TABS: 40.0000 mg | ORAL_TABLET | Freq: Every day | ORAL | 2 refills | Status: DC

## 2021-11-12 MED ORDER — DIAZEPAM 2 MG PO TABS: ORAL_TABLET | ORAL | 2 refills | Status: DC
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
## 2021-11-13 ENCOUNTER — Other Ambulatory Visit: Payer: Self-pay | Admitting: Family Medicine

## 2021-11-13 DIAGNOSIS — M25572 Pain in left ankle and joints of left foot: Secondary | ICD-10-CM

## 2021-11-19 ENCOUNTER — Other Ambulatory Visit (INDEPENDENT_AMBULATORY_CARE_PROVIDER_SITE_OTHER): Payer: Medicare HMO

## 2021-11-19 ENCOUNTER — Ambulatory Visit (INDEPENDENT_AMBULATORY_CARE_PROVIDER_SITE_OTHER): Payer: Medicare HMO | Admitting: Gastroenterology

## 2021-11-19 ENCOUNTER — Encounter: Payer: Self-pay | Admitting: Gastroenterology

## 2021-11-19 VITALS — BP 124/64 | HR 85 | Ht 66.5 in | Wt 240.0 lb

## 2021-11-19 DIAGNOSIS — K760 Fatty (change of) liver, not elsewhere classified: Secondary | ICD-10-CM

## 2021-11-19 DIAGNOSIS — R7989 Other specified abnormal findings of blood chemistry: Secondary | ICD-10-CM

## 2021-11-19 DIAGNOSIS — K582 Mixed irritable bowel syndrome: Secondary | ICD-10-CM

## 2021-11-19 LAB — PROTIME-INR
INR: 1.1 ratio — ABNORMAL HIGH (ref 0.8–1.0)
Prothrombin Time: 11.8 s (ref 9.6–13.1)

## 2021-11-19 LAB — COMPREHENSIVE METABOLIC PANEL
ALT: 59 U/L — ABNORMAL HIGH (ref 0–35)
AST: 41 U/L — ABNORMAL HIGH (ref 0–37)
Albumin: 4.9 g/dL (ref 3.5–5.2)
Alkaline Phosphatase: 163 U/L — ABNORMAL HIGH (ref 39–117)
BUN: 21 mg/dL (ref 6–23)
CO2: 25 mEq/L (ref 19–32)
Calcium: 10.5 mg/dL (ref 8.4–10.5)
Chloride: 100 mEq/L (ref 96–112)
Creatinine, Ser: 1.02 mg/dL (ref 0.40–1.20)
GFR: 60.67 mL/min (ref >60.00)
Glucose, Bld: 105 mg/dL — ABNORMAL HIGH (ref 70–99)
Potassium: 4.3 mEq/L (ref 3.5–5.1)
Sodium: 137 mEq/L (ref 135–145)
Total Bilirubin: 0.5 mg/dL (ref 0.2–1.2)
Total Protein: 8.5 g/dL — ABNORMAL HIGH (ref 6.0–8.3)

## 2021-11-19 LAB — CBC WITH DIFFERENTIAL/PLATELET
Basophils Absolute: 0.1 10*3/uL (ref 0.0–0.1)
Basophils Relative: 1.2 % (ref 0.0–3.0)
Eosinophils Absolute: 0.1 10*3/uL (ref 0.0–0.7)
Eosinophils Relative: 1.5 % (ref 0.0–5.0)
HCT: 45.3 % (ref 36.0–46.0)
Hemoglobin: 15.3 g/dL — ABNORMAL HIGH (ref 12.0–15.0)
Lymphocytes Relative: 35.7 % (ref 12.0–46.0)
Lymphs Abs: 2.8 10*3/uL (ref 0.7–4.0)
MCHC: 33.8 g/dL (ref 30.0–36.0)
MCV: 90.4 fl (ref 78.0–100.0)
Monocytes Absolute: 0.5 10*3/uL (ref 0.1–1.0)
Monocytes Relative: 5.8 % (ref 3.0–12.0)
Neutro Abs: 4.4 10*3/uL (ref 1.4–7.7)
Neutrophils Relative %: 55.8 % (ref 43.0–77.0)
Platelets: 252 10*3/uL (ref 150.0–400.0)
RBC: 5 Mil/uL (ref 3.87–5.11)
RDW: 13.2 % (ref 11.5–15.5)
WBC: 7.9 10*3/uL (ref 4.0–10.5)

## 2021-11-19 LAB — IBC + FERRITIN
Ferritin: 218.9 ng/mL (ref 10.0–291.0)
Iron: 91 ug/dL (ref 42–145)
Saturation Ratios: 31.7 % (ref 20.0–50.0)
TIBC: 287 ug/dL (ref 250.0–450.0)
Transferrin: 205 mg/dL — ABNORMAL LOW (ref 212.0–360.0)

## 2021-11-19 NOTE — Patient Instructions (Signed)
Your provider has requested that you go to the basement level for lab work before leaving today. Press "B" on the elevator. The lab is located at the first door on the left as you exit the elevator.   You have been scheduled for an abdominal ultrasound RUQ at [facility] (1st floor of hospital) on 11/26/2021 at 3:00pm. Please arrive 15 minutes prior to your appointment for registration. Make certain not to have anything to eat or drink 8  hours prior to your appointment. Should you need to reschedule your appointment, please contact radiology at [phone]. This test typically takes about 30 minutes to perform.   Take Vitamin E 400 IU daily  Increase dietary fiber and water   Follow up in 3 months   Due to recent changes in healthcare laws, you may see the results of your imaging and laboratory studies on MyChart before your provider has had a chance to review them.  We understand that in some cases there may be results that are confusing or concerning to you. Not all laboratory results come back in the same time frame and the provider may be waiting for multiple results in order to interpret others.  Please give [place] 48 hours in order for your provider to thoroughly review all the results before contacting the office for clarification of your results.    I appreciate the  opportunity to care for you  Thank You   Harl Bowie , MD
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
## 2021-11-26 ENCOUNTER — Ambulatory Visit (HOSPITAL_COMMUNITY): Payer: Medicare HMO

## 2021-12-12 ENCOUNTER — Other Ambulatory Visit: Payer: Self-pay | Admitting: Family Medicine

## 2021-12-12 DIAGNOSIS — M25572 Pain in left ankle and joints of left foot: Secondary | ICD-10-CM

## 2021-12-12 DIAGNOSIS — K219 Gastro-esophageal reflux disease without esophagitis: Secondary | ICD-10-CM

## 2021-12-20 ENCOUNTER — Telehealth: Payer: Self-pay | Admitting: Family Medicine

## 2021-12-20 NOTE — Telephone Encounter (Signed)
Left message for patient to call back and schedule Medicare Annual Wellness Visit (AWV) either virtually or in office. Left  my jabber number [phone] ° ° °AWV-I per [place] 04/22/18 ° please schedule at anytime with [facility] Nurse Health Advisor 1 or 2 ° ° °This should be a 45 minute visit.  °

## 2021-12-24 ENCOUNTER — Telehealth: Payer: Self-pay | Admitting: Pharmacist

## 2021-12-24 NOTE — Progress Notes (Signed)
? ?Chronic Care Management ?Pharmacy Note ? ? ?Name:  Tiffany Reese MRN:  [id number] DOB:  [birth year] ? ?Summary: ?A1c at goal < 7% ?FBGs are at goal 80-130  ?Pt is overdue for follow up with PCP ? ?Recommendations/Changes made from today's visit: ?-Recommend moderate intensity statin after resolution of elevated LFTs ?-Recommended repeat A1c ?-Provided telephone number for [place] ? ?Plan: ?Follow up DM assessment in 3 months ?Follow up in 6 months ? ?Subjective: ?Tiffany Reese is an 61 y.o. year old female who is a primary patient of Koberlein, Steele Berg, MD.  The CCM team was consulted for assistance with disease management and care coordination needs.   ? ?Engaged with patient by telephone for follow up visit in response to provider referral for pharmacy case management and/or care coordination services.  ? ?Consent to Services:  ?The patient was given information about Chronic Care Management services, agreed to services, and gave verbal consent prior to initiation of services.  Please see initial visit note for detailed documentation.  ? ?Patient Care Team: ?Caren Macadam, MD as PCP - General (Family Medicine) ?Viona Gilmore, [facility] as Pharmacist (Pharmacist) ? ?Recent office visits: ?06/19/21 Micheline Rough, MD: Patient presented for diabetes follow up. A1c decreased to 6.0%. Administered influenza vaccine and Prevnar 20. ? ?Recent consult visits: ?11/19/21 Harl Bowie, MD (gastro): Patient presented for elevated LFTs and fatty liver visit. Recommended abdominal ultrasound RUQ and starting vitamin E 400 units daily. ? ?11/12/21 Berniece Andreas, MD (behavioral health): Patient presented for PTSD and anxiety follow up. Recommended therapy. Follow up in 3 months. ? ?10/23/21 Clent Jacks (ophthalmology): Patient presented for eye exam. ? ?08/12/21 Berniece Andreas, MD (behavioral health): Patient presented for PTSD and anxiety follow up. Recommended therapy. No medication  changes. ? ?06/17/21 [name], LCSW (behavioral health): Patient presented for counseling follow up. ? ?04/03/21 Ila Mcgill, DPM: Patient presented for ingrown toenail removal. ? ?Hospital visits: ?None in previous 6 months ? ? ?Objective: ? ?Lab Results  ?Component Value Date  ? CREATININE 1.02 11/19/2021  ? BUN 21 11/19/2021  ? GFR 60.67 11/19/2021  ? GFRNONAA >60 11/05/2018  ? GFRAA >60 11/05/2018  ? NA 137 11/19/2021  ? K 4.3 11/19/2021  ? CALCIUM 10.5 11/19/2021  ? CO2 25 11/19/2021  ? GLUCOSE 105 (H) 11/19/2021  ? ? ?Lab Results  ?Component Value Date/Time  ? HGBA1C 6.0 06/10/2021 02:25 PM  ? HGBA1C 10.2 (A) 03/08/2021 04:57 PM  ? HGBA1C 7.6 (H) 10/31/2020 03:47 PM  ? GFR 60.67 11/19/2021 02:41 PM  ? GFR 53.82 (L) 06/10/2021 02:25 PM  ? MICROALBUR <0.7 06/19/2021 04:05 PM  ? MICROALBUR 1.1 02/27/2020 12:54 PM  ?  ?Last diabetic Eye exam:  ?Lab Results  ?Component Value Date/Time  ? HMDIABEYEEXA No Retinopathy 10/23/2021 12:00 AM  ?  ?Last diabetic Foot exam: No results found for: HMDIABFOOTEX  ? ?Lab Results  ?Component Value Date  ? CHOL 203 (H) 06/10/2021  ? HDL 53.20 06/10/2021  ? LDLCALC 115 (H) 06/10/2021  ? TRIG 174.0 (H) 06/10/2021  ? CHOLHDL 4 06/10/2021  ? ? ? ?  Latest Ref Rng & Units 11/19/2021  ?  2:41 PM 06/10/2021  ?  2:25 PM 03/22/2021  ?  2:46 PM  ?Hepatic Function  ?Total Protein 6.0 - 8.3 g/dL 8.5   8.6   7.7    ?Albumin 3.5 - 5.2 g/dL 4.9   4.8     ?AST 0 - 37 U/L 41  51   76    ?ALT 0 - 35 U/L 59   79   91    ?Alk Phosphatase 39 - 117 U/L 163   147     ?Total Bilirubin 0.2 - 1.2 mg/dL 0.5   0.5   0.5    ?Bilirubin, Direct 0.0 - 0.2 mg/dL   0.1    ? ? ?Lab Results  ?Component Value Date/Time  ? TSH 2.71 10/31/2020 03:47 PM  ? TSH 1.89 02/27/2020 12:54 PM  ? ? ? ?  Latest Ref Rng & Units 11/19/2021  ?  2:41 PM 10/31/2020  ?  3:47 PM 02/27/2020  ? 12:54 PM  ?CBC  ?WBC 4.0 - 10.5 K/uL 7.9   7.8   7.3    ?Hemoglobin 12.0 - 15.0 g/dL 15.3   14.3   13.2    ?Hematocrit 36.0 - 46.0 % 45.3   43.0    39.7    ?Platelets 150.0 - 400.0 K/uL 252.0   212.0   210.0    ? ? ?Lab Results  ?Component Value Date/Time  ? VD25OH 49.43 02/27/2020 12:54 PM  ? VD25OH 32.38 11/01/2018 11:12 AM  ? ? ?Clinical ASCVD: No  ?The 10-year ASCVD risk score (Arnett DK, et al., 2019) is: 6.7% ?  Values used to calculate the score: ?    Age: 61 years ?    Sex: Female ?    Is Non-Hispanic African American: No ?    Diabetic: Yes ?    Tobacco smoker: No ?    Systolic Blood Pressure: 124 mmHg ?    Is BP treated: Yes ?    HDL Cholesterol: 53.2 mg/dL ?    Total Cholesterol: 203 mg/dL   ? ? ?  05/20/2021  ?  1:17 AM 03/20/2021  ? 10:17 AM 03/12/2021  ?  9:26 PM  ?Depression screen PHQ 2/9  ?Decreased Interest 1 1 1  ?Down, Depressed, Hopeless 1 1 1  ?PHQ - 2 Score 2 2 2  ?Altered sleeping 1 0 1  ?Tired, decreased energy 1 1 3  ?Change in appetite 0 1 0  ?Feeling bad or failure about yourself  1 1 1  ?Trouble concentrating 1 1 0  ?Moving slowly or fidgety/restless 0 0   ?Suicidal thoughts 1 0 0  ?PHQ-9 Score 7 6 7  ?Difficult doing work/chores Extremely dIfficult Somewhat difficult   ?  ? ? ?Social History  ? ?Tobacco Use  ?Smoking Status Never  ?Smokeless Tobacco Never  ? ?BP Readings from Last 3 Encounters:  ?11/19/21 124/64  ?06/19/21 100/78  ?03/08/21 118/76  ? ?Pulse Readings from Last 3 Encounters:  ?11/19/21 85  ?06/19/21 84  ?03/08/21 86  ? ?Wt Readings from Last 3 Encounters:  ?11/19/21 240 lb (108.9 kg)  ?11/12/21 235 lb (106.6 kg)  ?08/12/21 245 lb (111.1 kg)  ? ?BMI Readings from Last 3 Encounters:  ?11/19/21 38.16 kg/m?  ?11/12/21 37.93 kg/m?  ?08/12/21 39.54 kg/m?  ? ? ?Assessment/Interventions: Review of patient past medical history, allergies, medications, health status, including review of consultants reports, laboratory and other test data, was performed as part of comprehensive evaluation and provision of chronic care management services.  ? ?SDOH:  (Social Determinants of Health) assessments and interventions performed: No ? ?SDOH  Screenings  ? ?Alcohol Screen: Not on file  ?Depression (PHQ2-9): Medium Risk  ? PHQ-2 Score: 7  ?Financial Resource Strain: Not on file  ?Food Insecurity: Not on file  ?Housing: Not on file  ?  Physical Activity: Not on file  ?Social Connections: Not on file  ?Stress: Not on file  ?Tobacco Use: Low Risk   ? Smoking Tobacco Use: Never  ? Smokeless Tobacco Use: Never  ? Passive Exposure: Not on file  ?Transportation Needs: Not on file  ? ? ?[place] ? ?Allergies  ?Allergen Reactions  ? Bupropion Other (See Comments)  ?  Causes agitation  ? Duloxetine Other (See Comments)  ?  Causes involuntary movements  ? Sulfa Antibiotics   ? Metformin And Related   ?  vomiting  ? Paxil [Paroxetine Hcl]   ?  Insomnia; manic  ? Prozac [Fluoxetine Hcl]   ?  Hyperactive, insomnia  ? Trintellix [Vortioxetine]   ?  Triggers fibromyalgia  ? Zoloft [Sertraline Hcl]   ?  Hyperactivity, insomnia  ? ? ?Medications Reviewed Today   ? ? Reviewed by Viona Gilmore, Reynolds (Pharmacist) on 12/25/21 at 1523  Med List Status: <None>  ? ?Medication Order Taking? Sig Documenting Provider Last Dose Status Informant  ?ACCU-CHEK AVIVA PLUS test strip [id number]  USE AS DIRECTED UP TO 4 TIMES A DAY Lucretia Kern, DO  Active   ?amitriptyline (ELAVIL) 50 MG tablet [id number]  Take 1 tablet (50 mg total) by mouth at bedtime. [name], MD  Active   ?Blood Pressure Monitoring (BLOOD PRESSURE MONITOR/L CUFF) MISC [id number]  1 Device by Does not apply route daily. Caren Macadam, MD  Active   ?Calcium Citrate-Vitamin D (CALCIUM CITRATE + D PO) [id number] Yes Take 630 mg by mouth. 650 mg of Calcium and 500 units of vitamin D [provider] Taking Active   ?cetirizine (ZYRTEC) 10 MG tablet [id number]  TAKE 1 TABLET BY MOUTH EVERY DAY Koberlein, Junell C, MD  Active   ?diazepam (VALIUM) 2 MG tablet [id number]  Take two to three tab as needed for anxiety Arfeen, Arlyce Harman, MD  Active   ?diclofenac (VOLTAREN) 75 MG EC tablet [id number]  TAKE 1 TABLET  (75 MG TOTAL) BY MOUTH 2 (TWO) TIMES DAILY AS NEEDED FOR MODERATE PAIN. Caren Macadam, MD  Active   ?diclofenac Sodium (VOLTAREN) 1 % GEL [id number]  Apply topically as needed. [provider]  Active

## 2021-12-24 NOTE — Chronic Care Management (AMB) (Signed)
? ? ?  Chronic Care Management ?Pharmacy Assistant  ? ?Name: Tiffany Reese  MRN: [id number] DOB: 02-09-1961 ? ?12/24/21 APPOINTMENT REMINDER ? ? ?Called Patient No answer, left message of appointment on 12/25/21 at 3 via telephone visit with Gaylord Shih, Pharm D.  ? ?Notified to have all medications, supplements, blood pressure and/or blood sugar logs available during appointment and to return call if need to reschedule. ? ? ? ?Care Gaps: ?HIV Screening - Overdue ?TDAP - Overdue ?PAP - Overdue ?Mammogram - Overdue ?COVID Booster - Overdue ?HGB A1C - Overdue ?AWV- office aware to sched (10/24/21) ?Lab Results  ?Component Value Date  ? HGBA1C 6.0 06/10/2021  ? ? ?Star Rating Drug: ?Semaglutide (Ozempic) 0.5 mg - Last filled 11/13/21 90 DS at [facility] ? ? ?Medications: ?Outpatient Encounter Medications as of 12/24/2021  ?Medication Sig  ? ACCU-CHEK AVIVA PLUS test strip USE AS DIRECTED UP TO 4 TIMES A DAY  ? amitriptyline (ELAVIL) 50 MG tablet Take 1 tablet (50 mg total) by mouth at bedtime.  ? Blood Pressure Monitoring (BLOOD PRESSURE MONITOR/L CUFF) MISC 1 Device by Does not apply route daily.  ? cetirizine (ZYRTEC) 10 MG tablet TAKE 1 TABLET BY MOUTH EVERY DAY  ? Cholecalciferol (VITAMIN D-3) 125 MCG (5000 UT) TABS Take 1 tablet by mouth daily.  ? diazepam (VALIUM) 2 MG tablet Take two to three tab as needed for anxiety  ? diclofenac (VOLTAREN) 75 MG EC tablet TAKE 1 TABLET (75 MG TOTAL) BY MOUTH 2 (TWO) TIMES DAILY AS NEEDED FOR MODERATE PAIN.  ? diclofenac Sodium (VOLTAREN) 1 % GEL Apply topically as needed.  ? Ferrous Sulfate (IRON) 325 (65 Fe) MG TABS Take 1 tablet by mouth daily.  ? furosemide (LASIX) 20 MG tablet TAKE 1 TABLET BY MOUTH EVERY DAY  ? levothyroxine (SYNTHROID) 50 MCG tablet TAKE 1 TABLET BY MOUTH EVERY DAY  ? lurasidone (LATUDA) 40 MG TABS tablet Take 1 tablet (40 mg total) by mouth daily with breakfast.  ? methocarbamol (ROBAXIN) 750 MG tablet TAKE 1 TABLET EVERY 8 HOURS AS NEEDED FOR MUSCLE SPASMS  ?  Multiple Vitamins-Minerals (MULTIVITAMIN ADULT PO) Take 1 tablet by mouth daily.  ? Omega-3 Fatty Acids (FISH OIL PO) Take by mouth daily.  ? omeprazole (PRILOSEC) 40 MG capsule TAKE 1 CAPSULE (40 MG TOTAL) BY MOUTH DAILY.  ? pregabalin (LYRICA) 50 MG capsule TAKE 1 CAPSULE BY MOUTH TWICE A DAY  ? Semaglutide,0.25 or 0.5MG /DOS, (OZEMPIC, 0.25 OR 0.5 MG/DOSE,) 2 MG/1.5ML SOPN Inject 0.5 mg into the skin once a week.  ? ?No facility-administered encounter medications on file as of 12/24/2021.  ? ? ? ?Pamala Duffel CMA ?Clinical Pharmacist Assistant ?[phone] ? ?

## 2021-12-25 ENCOUNTER — Ambulatory Visit (INDEPENDENT_AMBULATORY_CARE_PROVIDER_SITE_OTHER): Payer: Medicare HMO | Admitting: Pharmacist

## 2021-12-25 DIAGNOSIS — F339 Major depressive disorder, recurrent, unspecified: Secondary | ICD-10-CM

## 2021-12-25 DIAGNOSIS — E118 Type 2 diabetes mellitus with unspecified complications: Secondary | ICD-10-CM

## 2022-01-06 ENCOUNTER — Telehealth: Payer: Medicare HMO

## 2022-01-12 ENCOUNTER — Other Ambulatory Visit: Payer: Self-pay | Admitting: Family Medicine

## 2022-01-12 DIAGNOSIS — M25572 Pain in left ankle and joints of left foot: Secondary | ICD-10-CM

## 2022-01-13 ENCOUNTER — Ambulatory Visit: Payer: Medicare HMO

## 2022-01-13 ENCOUNTER — Telehealth: Payer: Self-pay

## 2022-01-13 NOTE — Telephone Encounter (Signed)
Unsuccessful attempt to reach patient on preferred number listed in notes for scheduled AWV. Left message on voicemail okay to reschedule. 

## 2022-01-19 DIAGNOSIS — E039 Hypothyroidism, unspecified: Secondary | ICD-10-CM

## 2022-01-19 DIAGNOSIS — E785 Hyperlipidemia, unspecified: Secondary | ICD-10-CM | POA: Diagnosis not present

## 2022-01-19 DIAGNOSIS — I10 Essential (primary) hypertension: Secondary | ICD-10-CM

## 2022-01-19 DIAGNOSIS — F339 Major depressive disorder, recurrent, unspecified: Secondary | ICD-10-CM

## 2022-01-19 DIAGNOSIS — E118 Type 2 diabetes mellitus with unspecified complications: Secondary | ICD-10-CM

## 2022-01-21 ENCOUNTER — Ambulatory Visit (INDEPENDENT_AMBULATORY_CARE_PROVIDER_SITE_OTHER): Payer: Medicare HMO

## 2022-01-21 VITALS — Ht 66.5 in | Wt 240.0 lb

## 2022-01-21 DIAGNOSIS — Z0000 Encounter for general adult medical examination without abnormal findings: Secondary | ICD-10-CM | POA: Diagnosis not present
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
## 2022-01-21 NOTE — Patient Instructions (Addendum)
?Ms. Reese , ?Thank you for taking time to come for your Medicare Wellness Visit. I appreciate your ongoing commitment to your health goals. Please review the following plan we discussed and let me know if I can assist you in the future.  ? ?These are the goals we discussed: ? Goals   ? ?   Weight (lb) < 200 lb (90.7 kg) (pt-stated)   ?   I would like to lose 100lbs Change my eating habits. ?  ? ?  ?  ?This is a list of the screening recommended for you and due dates:  ?Health Maintenance  ?Topic Date Due  ? HIV Screening  Never done  ? Pap Smear  09/22/2016  ? Hemoglobin A1C  12/08/2021  ? COVID-19 Vaccine (3 - Booster for Janssen series) 02/06/2022*  ? Mammogram  01/22/2023*  ? Tetanus Vaccine  01/22/2023*  ? Flu Shot  04/22/2022  ? Urine Protein Check  06/19/2022  ? Complete foot exam   06/21/2022  ? Eye exam for diabetics  10/23/2022  ? Colon Cancer Screening  02/22/2026  ? Hepatitis C Screening: USPSTF Recommendation to screen - Ages 11-79 yo.  Completed  ? Zoster (Shingles) Vaccine  Completed  ? HPV Vaccine  Aged Out  ?*Topic was postponed. The date shown is not the original due date.  ? ?Advanced directives: No  ? ?Conditions/risks identified: None ? ?Next appointment: Follow up in one year for your annual wellness visit.   ? ?Preventive Care 40-64 Years, Female ?Preventive care refers to lifestyle choices and visits with your health care provider that can promote health and wellness. ?What does preventive care include? ?A yearly physical exam. This is also called an annual well check. ?Dental exams once or twice a year. ?Routine eye exams. Ask your health care provider how often you should have your eyes checked. ?Personal lifestyle choices, including: ?Daily care of your teeth and gums. ?Regular physical activity. ?Eating a healthy diet. ?Avoiding tobacco and drug use. ?Limiting alcohol use. ?Practicing safe sex. ?Taking low-dose aspirin daily starting at age 24. ?Taking vitamin and mineral supplements  as recommended by your health care provider. ?What happens during an annual well check? ?The services and screenings done by your health care provider during your annual well check will depend on your age, overall health, lifestyle risk factors, and family history of disease. ?Counseling  ?Your health care provider may ask you questions about your: ?Alcohol use. ?Tobacco use. ?Drug use. ?Emotional well-being. ?Home and relationship well-being. ?Sexual activity. ?Eating habits. ?Work and work Statistician. ?Method of birth control. ?Menstrual cycle. ?Pregnancy history. ?Screening  ?You may have the following tests or measurements: ?Height, weight, and BMI. ?Blood pressure. ?Lipid and cholesterol levels. These may be checked every 5 years, or more frequently if you are over 55 years old. ?Skin check. ?Lung cancer screening. You may have this screening every year starting at age 28 if you have a 30-pack-year history of smoking and currently smoke or have quit within the past 15 years. ?Fecal occult blood test (FOBT) of the stool. You may have this test every year starting at age 47. ?Flexible sigmoidoscopy or colonoscopy. You may have a sigmoidoscopy every 5 years or a colonoscopy every 10 years starting at age 72. ?Hepatitis C blood test. ?Hepatitis B blood test. ?Sexually transmitted disease (STD) testing. ?Diabetes screening. This is done by checking your blood sugar (glucose) after you have not eaten for a while (fasting). You may have this done every 1-3 years. ?Mammogram.  This may be done every 1-2 years. Talk to your health care provider about when you should start having regular mammograms. This may depend on whether you have a family history of breast cancer. ?BRCA-related cancer screening. This may be done if you have a family history of breast, ovarian, tubal, or peritoneal cancers. ?Pelvic exam and Pap test. This may be done every 3 years starting at age 60. Starting at age 26, this may be done every 5 years  if you have a Pap test in combination with an HPV test. ?Bone density scan. This is done to screen for osteoporosis. You may have this scan if you are at high risk for osteoporosis. ?Discuss your test results, treatment options, and if necessary, the need for more tests with your health care provider. ?Vaccines  ?Your health care provider may recommend certain vaccines, such as: ?Influenza vaccine. This is recommended every year. ?Tetanus, diphtheria, and acellular pertussis (Tdap, Td) vaccine. You may need a Td booster every 10 years. ?Zoster vaccine. You may need this after age 35. ?Pneumococcal 13-valent conjugate (PCV13) vaccine. You may need this if you have certain conditions and were not previously vaccinated. ?Pneumococcal polysaccharide (PPSV23) vaccine. You may need one or two doses if you smoke cigarettes or if you have certain conditions. ?Talk to your health care provider about which screenings and vaccines you need and how often you need them. ?This information is not intended to replace advice given to you by your health care provider. Make sure you discuss any questions you have with your health care provider. ?Document Released: 10/05/2015 Document Revised: 05/28/2016 Document Reviewed: 07/10/2015 ?Elsevier Interactive Patient Education ? 2017 [employer] ? ? ? ?Fall Prevention in the Home ?Falls can cause injuries. They can happen to people of all ages. There are many things you can do to make your home safe and to help prevent falls. ?What can I do on the outside of my home? ?Regularly fix the edges of walkways and driveways and fix any cracks. ?Remove anything that might make you trip as you walk through a door, such as a raised step or threshold. ?Trim any bushes or trees on the path to your home. ?Use bright outdoor lighting. ?Clear any walking paths of anything that might make someone trip, such as rocks or tools. ?Regularly check to see if handrails are loose or broken. Make sure that both  sides of any steps have handrails. ?Any raised decks and porches should have guardrails on the edges. ?Have any leaves, snow, or ice cleared regularly. ?Use sand or salt on walking paths during winter. ?Clean up any spills in your garage right away. This includes oil or grease spills. ?What can I do in the bathroom? ?Use night lights. ?Install grab bars by the toilet and in the tub and shower. Do not use towel bars as grab bars. ?Use non-skid mats or decals in the tub or shower. ?If you need to sit down in the shower, use a plastic, non-slip stool. ?Keep the floor dry. Clean up any water that spills on the floor as soon as it happens. ?Remove soap buildup in the tub or shower regularly. ?Attach bath mats securely with double-sided non-slip rug tape. ?Do not have throw rugs and other things on the floor that can make you trip. ?What can I do in the bedroom? ?Use night lights. ?Make sure that you have a light by your bed that is easy to reach. ?Do not use any sheets or blankets that are  too big for your bed. They should not hang down onto the floor. ?Have a firm chair that has side arms. You can use this for support while you get dressed. ?Do not have throw rugs and other things on the floor that can make you trip. ?What can I do in the kitchen? ?Clean up any spills right away. ?Avoid walking on wet floors. ?Keep items that you use a lot in easy-to-reach places. ?If you need to reach something above you, use a strong step stool that has a grab bar. ?Keep electrical cords out of the way. ?Do not use floor polish or wax that makes floors slippery. If you must use wax, use non-skid floor wax. ?Do not have throw rugs and other things on the floor that can make you trip. ?What can I do with my stairs? ?Do not leave any items on the stairs. ?Make sure that there are handrails on both sides of the stairs and use them. Fix handrails that are broken or loose. Make sure that handrails are as long as the stairways. ?Check any  carpeting to make sure that it is firmly attached to the stairs. Fix any carpet that is loose or worn. ?Avoid having throw rugs at the top or bottom of the stairs. If you do have throw rugs, attach th

## 2022-01-22 ENCOUNTER — Other Ambulatory Visit: Payer: Self-pay | Admitting: Family Medicine

## 2022-01-24 ENCOUNTER — Ambulatory Visit (HOSPITAL_COMMUNITY): Payer: Medicare HMO

## 2022-01-28 ENCOUNTER — Encounter (HOSPITAL_COMMUNITY): Payer: Self-pay | Admitting: Psychiatry

## 2022-01-28 ENCOUNTER — Telehealth (HOSPITAL_BASED_OUTPATIENT_CLINIC_OR_DEPARTMENT_OTHER): Payer: Medicare HMO | Admitting: Psychiatry

## 2022-01-28 DIAGNOSIS — F319 Bipolar disorder, unspecified: Secondary | ICD-10-CM | POA: Diagnosis not present

## 2022-01-28 DIAGNOSIS — F4310 Post-traumatic stress disorder, unspecified: Secondary | ICD-10-CM

## 2022-01-28 DIAGNOSIS — F419 Anxiety disorder, unspecified: Secondary | ICD-10-CM

## 2022-01-28 MED ORDER — AMITRIPTYLINE HCL 50 MG PO TABS: 50.0000 mg | ORAL_TABLET | Freq: Every day | ORAL | 2 refills | Status: DC

## 2022-01-28 MED ORDER — LURASIDONE HCL 40 MG PO TABS: 40.0000 mg | ORAL_TABLET | Freq: Every day | ORAL | 2 refills | Status: DC

## 2022-01-28 MED ORDER — DIAZEPAM 2 MG PO TABS: ORAL_TABLET | ORAL | 2 refills | Status: DC
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
## 2022-02-03 ENCOUNTER — Other Ambulatory Visit: Payer: Self-pay | Admitting: Family Medicine

## 2022-02-03 DIAGNOSIS — M797 Fibromyalgia: Secondary | ICD-10-CM

## 2022-02-05 ENCOUNTER — Ambulatory Visit: Payer: Medicare HMO | Admitting: Family Medicine

## 2022-02-06 ENCOUNTER — Ambulatory Visit (HOSPITAL_COMMUNITY): Payer: Medicare HMO

## 2022-02-10 ENCOUNTER — Other Ambulatory Visit: Payer: Self-pay | Admitting: Family Medicine

## 2022-02-10 DIAGNOSIS — M25572 Pain in left ankle and joints of left foot: Secondary | ICD-10-CM

## 2022-02-11 ENCOUNTER — Telehealth (HOSPITAL_COMMUNITY): Payer: Medicare HMO | Admitting: Psychiatry

## 2022-02-25 ENCOUNTER — Ambulatory Visit (HOSPITAL_COMMUNITY): Payer: Medicare HMO

## 2022-03-17 ENCOUNTER — Other Ambulatory Visit: Payer: Self-pay | Admitting: *Deleted

## 2022-03-17 DIAGNOSIS — K219 Gastro-esophageal reflux disease without esophagitis: Secondary | ICD-10-CM

## 2022-03-17 MED ORDER — OMEPRAZOLE 40 MG PO CPDR: 40.0000 mg | DELAYED_RELEASE_CAPSULE | Freq: Every day | ORAL | 0 refills | Status: DC

## 2022-03-17 MED ORDER — CETIRIZINE HCL 10 MG PO TABS
10.0000 mg | ORAL_TABLET | Freq: Every day | ORAL | 0 refills | Status: DC
Start: 2022-03-17 — End: 2022-06-18

## 2022-03-26 ENCOUNTER — Telehealth: Payer: Self-pay | Admitting: Pharmacist

## 2022-03-26 NOTE — Chronic Care Management (AMB) (Signed)
Chronic Care Management Pharmacy Assistant   Name: Tiffany Reese  MRN: [id number] DOB: [birth year]  Reason for Encounter: Disease State   Conditions to be addressed/monitored: DMII  Recent office visits:  01/21/22 Tillie Rung, LPN - Patient presented for Medicare Annual Wellness Exam. Patient voiced goal of loosing 100 lbs and changing eating habits.  Recent consult visits:  01/28/22 Kathryne Sharper T MD - Patient presented via video for Bipolar 1 Disorder and other concerns. No visit details available.  Hospital visits:  None in previous 6 months  Medications: Outpatient Encounter Medications as of 03/26/2022  Medication Sig   ACCU-CHEK AVIVA PLUS test strip USE AS DIRECTED UP TO 4 TIMES A DAY   amitriptyline (ELAVIL) 50 MG tablet Take 1 tablet (50 mg total) by mouth at bedtime.   Blood Pressure Monitoring (BLOOD PRESSURE MONITOR/L CUFF) MISC 1 Device by Does not apply route daily.   Calcium Citrate-Vitamin D (CALCIUM CITRATE + D PO) Take 630 mg by mouth. 650 mg of Calcium and 500 units of vitamin D   cetirizine (ZYRTEC) 10 MG tablet Take 1 tablet (10 mg total) by mouth daily.   diazepam (VALIUM) 2 MG tablet Take two to three tab as needed for anxiety   diclofenac (VOLTAREN) 75 MG EC tablet TAKE 1 TABLET TWICE A DAY AS NEEDED FOR MODERATE PAIN   diclofenac Sodium (VOLTAREN) 1 % GEL Apply topically as needed.   furosemide (LASIX) 20 MG tablet TAKE 1 TABLET BY MOUTH EVERY DAY   levothyroxine (SYNTHROID) 50 MCG tablet TAKE 1 TABLET BY MOUTH EVERY DAY   lurasidone (LATUDA) 40 MG TABS tablet Take 1 tablet (40 mg total) by mouth daily with breakfast.   methocarbamol (ROBAXIN) 750 MG tablet TAKE 1 TABLET EVERY 8 HOURS AS NEEDED FOR MUSCLE SPASMS   Multiple Vitamins-Minerals (MULTIVITAMIN ADULT PO) Take 1 tablet by mouth daily.   Omega-3 Fatty Acids (FISH OIL PO) Take by mouth daily.   omeprazole (PRILOSEC) 40 MG capsule Take 1 capsule (40 mg total) by mouth daily.   pregabalin  (LYRICA) 50 MG capsule TAKE 1 CAPSULE BY MOUTH TWICE A DAY   Semaglutide,0.25 or 0.5MG /DOS, (OZEMPIC, 0.25 OR 0.5 MG/DOSE,) 2 MG/1.5ML SOPN Inject 0.5 mg into the skin once a week.   vitamin E 180 MG (400 UNITS) capsule Take 400 Units by mouth daily.   No facility-administered encounter medications on file as of 03/26/2022.   Recent Relevant Labs: Lab Results  Component Value Date/Time   HGBA1C 6.0 06/10/2021 02:25 PM   HGBA1C 10.2 (A) 03/08/2021 04:57 PM   HGBA1C 7.6 (H) 10/31/2020 03:47 PM   MICROALBUR <0.7 06/19/2021 04:05 PM   MICROALBUR 1.1 02/27/2020 12:54 PM    Kidney Function Lab Results  Component Value Date/Time   CREATININE 1.02 11/19/2021 02:41 PM   CREATININE 1.13 06/10/2021 02:25 PM   GFR 60.67 11/19/2021 02:41 PM   GFRNONAA >60 11/05/2018 05:11 PM   GFRAA >60 11/05/2018 05:11 PM    Current antihyperglycemic regimen:  Ozempic inject 0.5 mg once weekly - Appropriate, Effective, Safe, Accessible Unable to reach patient to complete assessment     Care Gaps: HIV Screening - Overdue PAP Smear - Overdue COVID Booster - Overdue HGB A1C - Overdue Mammogram - Postponed TDAP - Postponed BP- 124/64 11/19/21 AWV- 5/23 CCM- 07/22/22 Lab Results  Component Value Date   HGBA1C 6.0 06/10/2021    Star Rating Drugs: Semaglutide (Ozempic) 0.5 mg - Last filled 02/19/22 90 DS at [facility] Verified  Laresia Green CMA Clinical Pharmacist Assistant [phone]  

## 2022-03-27 ENCOUNTER — Ambulatory Visit (HOSPITAL_COMMUNITY): Payer: Medicare HMO

## 2022-04-29 ENCOUNTER — Telehealth (HOSPITAL_BASED_OUTPATIENT_CLINIC_OR_DEPARTMENT_OTHER): Payer: Medicare HMO | Admitting: Psychiatry

## 2022-04-29 ENCOUNTER — Encounter (HOSPITAL_COMMUNITY): Payer: Self-pay | Admitting: Psychiatry

## 2022-04-29 DIAGNOSIS — F4310 Post-traumatic stress disorder, unspecified: Secondary | ICD-10-CM | POA: Diagnosis not present

## 2022-04-29 DIAGNOSIS — F319 Bipolar disorder, unspecified: Secondary | ICD-10-CM

## 2022-04-29 DIAGNOSIS — F419 Anxiety disorder, unspecified: Secondary | ICD-10-CM | POA: Diagnosis not present

## 2022-04-29 MED ORDER — AMITRIPTYLINE HCL 50 MG PO TABS: 50.0000 mg | ORAL_TABLET | Freq: Every day | ORAL | 2 refills | Status: DC

## 2022-04-29 MED ORDER — DIAZEPAM 2 MG PO TABS: ORAL_TABLET | ORAL | 2 refills | Status: DC

## 2022-04-29 MED ORDER — LURASIDONE HCL 40 MG PO TABS
40.0000 mg | ORAL_TABLET | Freq: Every day | ORAL | 2 refills | Status: DC
Start: 2022-04-29 — End: 2022-07-29
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
## 2022-05-08 ENCOUNTER — Ambulatory Visit (HOSPITAL_COMMUNITY): Payer: Medicare HMO

## 2022-05-14 ENCOUNTER — Other Ambulatory Visit: Payer: Self-pay | Admitting: *Deleted

## 2022-05-14 DIAGNOSIS — M25572 Pain in left ankle and joints of left foot: Secondary | ICD-10-CM

## 2022-05-15 MED ORDER — DICLOFENAC SODIUM 75 MG PO TBEC: DELAYED_RELEASE_TABLET | ORAL | 2 refills | Status: DC

## 2022-06-05 ENCOUNTER — Other Ambulatory Visit: Payer: Self-pay | Admitting: Family Medicine

## 2022-06-05 ENCOUNTER — Telehealth: Payer: Self-pay | Admitting: Pharmacist

## 2022-06-05 DIAGNOSIS — G8929 Other chronic pain: Secondary | ICD-10-CM

## 2022-06-05 NOTE — Telephone Encounter (Signed)
Ok to refill ozempic

## 2022-06-05 NOTE — Telephone Encounter (Signed)
Patient called me as she is running low on the Ozempic and the pharmacy is needing a refill sent in. Patient confirmed she is still taking 0.5 mg dose. Will route to new PCP to send in.

## 2022-06-05 NOTE — Chronic Care Management (AMB) (Signed)
    Chronic Care Management Pharmacy Assistant   Name: Amyiah Gaba  MRN: [id number] DOB: 1960-10-21  Reason for Encounter: Disease State   Conditions to be addressed/monitored: DMII  Recent office visits:  None  Recent consult visits:  04/29/22 Kathryne Sharper T MD [facility]) - Patient presented via video for Bipolar Disorder and other concerns. No other visit details available.  Hospital visits:  None in previous 6 months  Medications: Outpatient Encounter Medications as of 06/05/2022  Medication Sig   methocarbamol (ROBAXIN) 750 MG tablet Take 1 tablet (750 mg total) by mouth every 8 (eight) hours as needed for muscle spasms.   ACCU-CHEK AVIVA PLUS test strip USE AS DIRECTED UP TO 4 TIMES A DAY   amitriptyline (ELAVIL) 50 MG tablet Take 1 tablet (50 mg total) by mouth at bedtime.   Blood Pressure Monitoring (BLOOD PRESSURE MONITOR/L CUFF) MISC 1 Device by Does not apply route daily.   Calcium Citrate-Vitamin D (CALCIUM CITRATE + D PO) Take 630 mg by mouth. 650 mg of Calcium and 500 units of vitamin D   cetirizine (ZYRTEC) 10 MG tablet Take 1 tablet (10 mg total) by mouth daily.   diazepam (VALIUM) 2 MG tablet Take two to three tab as needed for anxiety   diclofenac (VOLTAREN) 75 MG EC tablet TAKE 1 TABLET TWICE A DAY AS NEEDED FOR MODERATE PAIN   diclofenac Sodium (VOLTAREN) 1 % GEL Apply topically as needed.   furosemide (LASIX) 20 MG tablet TAKE 1 TABLET BY MOUTH EVERY DAY   levothyroxine (SYNTHROID) 50 MCG tablet TAKE 1 TABLET BY MOUTH EVERY DAY   lurasidone (LATUDA) 40 MG TABS tablet Take 1 tablet (40 mg total) by mouth daily with breakfast.   Multiple Vitamins-Minerals (MULTIVITAMIN ADULT PO) Take 1 tablet by mouth daily.   Omega-3 Fatty Acids (FISH OIL PO) Take by mouth daily.   omeprazole (PRILOSEC) 40 MG capsule Take 1 capsule (40 mg total) by mouth daily.   pregabalin (LYRICA) 50 MG capsule TAKE 1 CAPSULE BY MOUTH TWICE A DAY   Semaglutide,0.25 or 0.5MG /DOS,  (OZEMPIC, 0.25 OR 0.5 MG/DOSE,) 2 MG/1.5ML SOPN Inject 0.5 mg into the skin once a week.   vitamin E 180 MG (400 UNITS) capsule Take 400 Units by mouth daily.   No facility-administered encounter medications on file as of 06/05/2022.  Recent Relevant Labs: Lab Results  Component Value Date/Time   HGBA1C 6.0 06/10/2021 02:25 PM   HGBA1C 10.2 (A) 03/08/2021 04:57 PM   HGBA1C 7.6 (H) 10/31/2020 03:47 PM   MICROALBUR <0.7 06/19/2021 04:05 PM   MICROALBUR 1.1 02/27/2020 12:54 PM    Kidney Function Lab Results  Component Value Date/Time   CREATININE 1.02 11/19/2021 02:41 PM   CREATININE 1.13 06/10/2021 02:25 PM   GFR 60.67 11/19/2021 02:41 PM   GFRNONAA >60 11/05/2018 05:11 PM   GFRAA >60 11/05/2018 05:11 PM    Current antihyperglycemic regimen:  Ozempic inject 0.5 mg once weekly  Unable to reach for assessment/ pt scheduled for Follow up with [facility] 06/23/22  Care Gaps: HIV Screen - Overdue PAP Smear - Overdue COVID Booster - Overdue HGB A1C - Overdue Flu Vaccine - Overdue Mammogram - Postponed TDAP - Postponed CCM- 10/23 BP- 124/64 11/19/21 AWV- 5/23 Lab Results  Component Value Date   HGBA1C 6.0 06/10/2021    Star Rating Drugs: Semaglutide (Ozempic) 0.5 mg - Last filled 02/14/22 56 DS at [facility]   Pamala Duffel CMA Clinical Pharmacist Assistant [phone]

## 2022-06-06 MED ORDER — OZEMPIC (0.25 OR 0.5 MG/DOSE) 2 MG/1.5ML ~~LOC~~ SOPN: 0.5000 mg | PEN_INJECTOR | SUBCUTANEOUS | 0 refills | Status: DC

## 2022-06-06 NOTE — Telephone Encounter (Signed)
Rx done. 

## 2022-06-17 ENCOUNTER — Ambulatory Visit (INDEPENDENT_AMBULATORY_CARE_PROVIDER_SITE_OTHER): Payer: Medicare HMO | Admitting: Family Medicine

## 2022-06-17 ENCOUNTER — Encounter: Payer: Self-pay | Admitting: Family Medicine

## 2022-06-17 VITALS — BP 100/58 | HR 88 | Temp 97.7°F | Ht 66.5 in | Wt 247.7 lb

## 2022-06-17 DIAGNOSIS — E118 Type 2 diabetes mellitus with unspecified complications: Secondary | ICD-10-CM | POA: Diagnosis not present

## 2022-06-17 DIAGNOSIS — I10 Essential (primary) hypertension: Secondary | ICD-10-CM

## 2022-06-17 DIAGNOSIS — Z23 Encounter for immunization: Secondary | ICD-10-CM

## 2022-06-17 LAB — POCT GLYCOSYLATED HEMOGLOBIN (HGB A1C): Hemoglobin A1C: 5.2 % (ref 4.0–5.6)

## 2022-06-17 MED ORDER — OZEMPIC (0.25 OR 0.5 MG/DOSE) 2 MG/1.5ML ~~LOC~~ SOPN: 0.5000 mg | PEN_INJECTOR | SUBCUTANEOUS | 3 refills | Status: DC

## 2022-06-17 NOTE — Progress Notes (Signed)
imm

## 2022-06-17 NOTE — Progress Notes (Unsigned)
Established Patient Office Visit  Subjective   Patient ID: Tiffany Reese, female    DOB: [birth year]  Age: 61 y.o. MRN: [id number]  Chief Complaint  Patient presents with   Establish Care   Grief    Patient feels sad due to recent loss of dog on September 2nd, she had for the past 16 years    Patient is here for transition of care visit.   Anxiety/depression-- pt reports she is still grieving the loss of her dog who passed away this summer after a a prolonged illness. She reports she is seeing a psychiatrist regularly.   DM type 2-- A1C performed in office today and is 5.2, she reports she is on ozempic 0.5 weekly and is tolerating this medication well. She is due for her annual labs today and urine microalbumin. Foot exam performed in office today and is normal. She denies any numbness or tingling of her feet. She is UTD on her eye exam (10/2021).  HTN -- BP in office performed and is well controlled. She reports no chest pain, SOB, dizziness or headaches. She has a BP cuff at home and is checking her BP regularly, reports they are in the normal range.    I also reviewed her HM measures today, she declines the mammogram, pap smear at this time. She reports she will get them at her next visit.   Current Outpatient Medications  Medication Instructions   ACCU-CHEK AVIVA PLUS test strip USE AS DIRECTED UP TO 4 TIMES A DAY   amitriptyline (ELAVIL) 50 mg, Oral, Daily at bedtime   Blood Pressure Monitoring (BLOOD PRESSURE MONITOR/L CUFF) MISC 1 Device, Does not apply, Daily   Calcium Citrate-Vitamin D (CALCIUM CITRATE + D PO) 630 mg, Oral, 650 mg of Calcium and 500 units of vitamin D   cetirizine (ZYRTEC) 10 mg, Oral, Daily   diazepam (VALIUM) 2 MG tablet Take two to three tab as needed for anxiety   diclofenac (VOLTAREN) 75 MG EC tablet TAKE 1 TABLET TWICE A DAY AS NEEDED FOR MODERATE PAIN   diclofenac Sodium (VOLTAREN) 1 % GEL Topical, As needed   furosemide (LASIX) 20 MG tablet  TAKE 1 TABLET BY MOUTH EVERY DAY   levothyroxine (SYNTHROID) 50 MCG tablet TAKE 1 TABLET BY MOUTH EVERY DAY   lurasidone (LATUDA) 40 mg, Oral, Daily with breakfast   methocarbamol (ROBAXIN) 750 mg, Oral, Every 8 hours PRN   Multiple Vitamins-Minerals (MULTIVITAMIN ADULT PO) 1 tablet, Oral, Daily   Omega-3 Fatty Acids (FISH OIL PO) Oral, Daily   omeprazole (PRILOSEC) 40 mg, Oral, Daily   Ozempic (0.25 or 0.5 MG/DOSE) 0.5 mg, Subcutaneous, Weekly   pregabalin (LYRICA) 50 MG capsule TAKE 1 CAPSULE BY MOUTH TWICE A DAY   vitamin E (VITAMIN E) 400 Units, Oral, Daily    Patient Active Problem List   Diagnosis Date Noted   Elevated d-dimer 11/16/2018   At risk for adverse drug reaction 11/10/2018   GERD (gastroesophageal reflux disease)    Hypertension    Diabetes mellitus type II, controlled ([place])    PTSD (post-traumatic stress disorder) 11/01/2018   Anxiety 11/01/2018   Depression, recurrent ([place]) 11/01/2018   Closed left ankle fracture 11/01/2018   Anemia 11/01/2018   Hypothyroid 11/01/2018   Fatty liver disease, nonalcoholic [id number]   BMI 45.0-49.9, adult ([place]) 04/29/2018   DDD (degenerative disc disease), thoracolumbar 04/29/2018      Review of Systems  Neurological:  Negative for tingling and sensory change.  All other  systems reviewed and are negative.     Objective:     BP (!) 100/58 (BP Location: Left Arm, Patient Position: Sitting, Cuff Size: Large)   Pulse 88   Temp 97.7 F (36.5 C) (Oral)   Ht 5' 6.5" (1.689 m)   Wt 247 lb 11.2 oz (112.4 kg)   SpO2 100%   BMI 39.38 kg/m  BP Readings from Last 3 Encounters:  06/17/22 (!) 100/58  11/19/21 124/64  06/19/21 100/78   Wt Readings from Last 3 Encounters:  06/17/22 247 lb 11.2 oz (112.4 kg)  01/21/22 240 lb (108.9 kg)  11/19/21 240 lb (108.9 kg)      Physical Exam Vitals reviewed.  Constitutional:      Appearance: Normal appearance. She is well-groomed. She is obese.  Eyes:     Conjunctiva/sclera:  Conjunctivae normal.  Neck:     Thyroid: No thyromegaly.  Cardiovascular:     Rate and Rhythm: Normal rate and regular rhythm.     Pulses: Normal pulses.          Dorsalis pedis pulses are 2+ on the right side and 2+ on the left side.     Heart sounds: S1 normal and S2 normal.  Pulmonary:     Effort: Pulmonary effort is normal.     Breath sounds: Normal breath sounds and air entry.  Abdominal:     General: Bowel sounds are normal.  Musculoskeletal:        General: Normal range of motion.     Right lower leg: No edema.     Left lower leg: No edema.  Feet:     Right foot:     Protective Sensation: 4 sites tested.  4 sites sensed.     Skin integrity: Skin integrity normal.     Left foot:     Protective Sensation: 4 sites tested.  4 sites sensed.     Skin integrity: Skin integrity normal.  Skin:    General: Skin is warm and dry.  Neurological:     Mental Status: She is alert and oriented to person, place, and time. Mental status is at baseline.     Gait: Gait is intact.  Psychiatric:        Mood and Affect: Mood and affect normal.        Speech: Speech normal.        Behavior: Behavior normal.        Judgment: Judgment normal.    Last thyroid functions Lab Results  Component Value Date   TSH 2.71 10/31/2020      The 10-year ASCVD risk score (Arnett DK, et al., 2019) is: 4.7%    Assessment & Plan:   Problem List Items Addressed This Visit       Cardiovascular and Mediastinum   Hypertension    Current hypertension medications:       Sig   furosemide (LASIX) 20 MG tablet (Taking) TAKE 1 TABLET BY MOUTH EVERY DAY  I believe pt takes this more for swelling in her extremities rather than BP, there are no other BP meds listed. BP is in the normal range today, will continue lasix 20 mg daily and she will continue to check her BP at home regularly.         Endocrine   Diabetes mellitus type II, controlled ([place]) - Primary    On ozempic 0.5 mg weekly, well controlled,  foot exam performed today and she is UTD on the eye exam. Needs urine microalbumin  today.      Relevant Medications   Semaglutide,0.25 or 0.5MG /DOS, (OZEMPIC, 0.25 OR 0.5 MG/DOSE,) 2 MG/1.5ML SOPN   Other Relevant Orders   POC HgB A1c (Completed)   Lipid Panel (Completed)   CMP (Completed)   Microalbumin/Creatinine Ratio, Urine (Completed)   Other Visit Diagnoses     Need for immunization against influenza       Relevant Orders   Flu Vaccine QUAD 6+ mos PF IM (Fluarix Quad PF) (Completed)       Return in about 6 months (around 12/16/2022) for follow up and pap smear.    Farrel Conners, MD

## 2022-06-18 ENCOUNTER — Other Ambulatory Visit: Payer: Self-pay | Admitting: Family Medicine

## 2022-06-18 LAB — MICROALBUMIN / CREATININE URINE RATIO
Creatinine,U: 39.8 mg/dL
Microalb Creat Ratio: 1.8 mg/g (ref 0.0–30.0)
Microalb, Ur: <0.7 mg/dL (ref 0.0–1.9)

## 2022-06-18 LAB — COMPREHENSIVE METABOLIC PANEL
ALT: 40 U/L — ABNORMAL HIGH (ref 0–35)
AST: 27 U/L (ref 0–37)
Albumin: 4.7 g/dL (ref 3.5–5.2)
Alkaline Phosphatase: 149 U/L — ABNORMAL HIGH (ref 39–117)
BUN: 23 mg/dL (ref 6–23)
CO2: 27 mEq/L (ref 19–32)
Calcium: 10.1 mg/dL (ref 8.4–10.5)
Chloride: 101 mEq/L (ref 96–112)
Creatinine, Ser: 1.21 mg/dL — ABNORMAL HIGH (ref 0.40–1.20)
GFR: 49.23 mL/min — ABNORMAL LOW (ref >60.00)
Glucose, Bld: 88 mg/dL (ref 70–99)
Potassium: 3.9 mEq/L (ref 3.5–5.1)
Sodium: 139 mEq/L (ref 135–145)
Total Bilirubin: 0.3 mg/dL (ref 0.2–1.2)
Total Protein: 8.2 g/dL (ref 6.0–8.3)

## 2022-06-18 LAB — LIPID PANEL
Cholesterol: 212 mg/dL — ABNORMAL HIGH (ref 0–200)
HDL: 59 mg/dL (ref >39.00)
LDL Cholesterol: 114 mg/dL — ABNORMAL HIGH (ref 0–99)
NonHDL: 153.4
Total CHOL/HDL Ratio: 4
Triglycerides: 196 mg/dL — ABNORMAL HIGH (ref 0.0–149.0)
VLDL: 39.2 mg/dL (ref 0.0–40.0)

## 2022-06-19 NOTE — Assessment & Plan Note (Signed)
Current hypertension medications:      Sig   furosemide (LASIX) 20 MG tablet (Taking) TAKE 1 TABLET BY MOUTH EVERY DAY    I believe pt takes this more for swelling in her extremities rather than BP, there are no other BP meds listed. BP is in the normal range today, will continue lasix 20 mg daily and she will continue to check her BP at home regularly.

## 2022-06-19 NOTE — Assessment & Plan Note (Signed)
On ozempic 0.5 mg weekly, well controlled, foot exam performed today and she is UTD on the eye exam. Needs urine microalbumin today.

## 2022-06-20 ENCOUNTER — Telehealth: Payer: Self-pay | Admitting: Pharmacist

## 2022-06-20 NOTE — Chronic Care Management (AMB) (Signed)
    Chronic Care Management Pharmacy Assistant   Name: Tiffany Reese  MRN: [id number] DOB: 1961/03/04  06/20/22 APPOINTMENT REMINDER  Patient was reminded to have all medications, supplements and any blood glucose and blood pressure readings available for review with Jeni Salles, Pharm. D, for telephone visit on 06/23/22 at 3.  Care Gaps: BP- 100/58 06/17/22 AWV- 5/23 Lab Results  Component Value Date   HGBA1C 5.2 06/17/2022    Star Rating Drug: Semaglutide (Ozempic) 0.5 mg - Last filled 06/09/22 56 [facility]    Medications: Outpatient Encounter Medications as of 06/20/2022  Medication Sig   cetirizine (ZYRTEC) 10 MG tablet TAKE 1 TABLET BY MOUTH EVERY DAY   ACCU-CHEK AVIVA PLUS test strip USE AS DIRECTED UP TO 4 TIMES A DAY   amitriptyline (ELAVIL) 50 MG tablet Take 1 tablet (50 mg total) by mouth at bedtime.   Blood Pressure Monitoring (BLOOD PRESSURE MONITOR/L CUFF) MISC 1 Device by Does not apply route daily.   Calcium Citrate-Vitamin D (CALCIUM CITRATE + D PO) Take 630 mg by mouth. 650 mg of Calcium and 500 units of vitamin D   diazepam (VALIUM) 2 MG tablet Take two to three tab as needed for anxiety   diclofenac (VOLTAREN) 75 MG EC tablet TAKE 1 TABLET TWICE A DAY AS NEEDED FOR MODERATE PAIN   diclofenac Sodium (VOLTAREN) 1 % GEL Apply topically as needed.   furosemide (LASIX) 20 MG tablet TAKE 1 TABLET BY MOUTH EVERY DAY   levothyroxine (SYNTHROID) 50 MCG tablet TAKE 1 TABLET BY MOUTH EVERY DAY   lurasidone (LATUDA) 40 MG TABS tablet Take 1 tablet (40 mg total) by mouth daily with breakfast.   methocarbamol (ROBAXIN) 750 MG tablet Take 1 tablet (750 mg total) by mouth every 8 (eight) hours as needed for muscle spasms.   Multiple Vitamins-Minerals (MULTIVITAMIN ADULT PO) Take 1 tablet by mouth daily.   Omega-3 Fatty Acids (FISH OIL PO) Take by mouth daily.   omeprazole (PRILOSEC) 40 MG capsule Take 1 capsule (40 mg total) by mouth daily.   pregabalin (LYRICA) 50 MG  capsule TAKE 1 CAPSULE BY MOUTH TWICE A DAY   Semaglutide,0.25 or 0.5MG /DOS, (OZEMPIC, 0.25 OR 0.5 MG/DOSE,) 2 MG/1.5ML SOPN Inject 0.5 mg into the skin once a week.   vitamin E 180 MG (400 UNITS) capsule Take 400 Units by mouth daily.   No facility-administered encounter medications on file as of 06/20/2022.      [place] Clinical Pharmacist Assistant [phone]

## 2022-06-23 ENCOUNTER — Ambulatory Visit: Payer: Medicare HMO | Admitting: Pharmacist

## 2022-06-23 DIAGNOSIS — E1165 Type 2 diabetes mellitus with hyperglycemia: Secondary | ICD-10-CM

## 2022-06-23 DIAGNOSIS — E1169 Type 2 diabetes mellitus with other specified complication: Secondary | ICD-10-CM

## 2022-06-23 NOTE — Progress Notes (Signed)
Chronic Care Management Pharmacy Note   Name:  Tiffany Reese MRN:  [id number] DOB:  [birth year]  Summary: A1c at goal < 7% LDL not at goal < 70   Recommendations/Changes made from today's visit: -Recommend moderate intensity statin after resolution of elevated LFTs and results of liver ultrasound -Consider increasing Ozempic for further weight loss   Plan: Follow up DM assessment in 3 months Follow up in 6 months  Subjective: Tiffany Reese is an 61 y.o. year old female who is a primary patient of Tiffany Reese.  The CCM team was consulted for assistance with disease management and care coordination needs.    Engaged with patient by telephone for follow up visit in response to provider referral for pharmacy case management and/or care coordination services.   Consent to Services:  The patient was given information about Chronic Care Management services, agreed to services, and gave verbal consent prior to initiation of services.  Please see initial visit note for detailed documentation.   Patient Care Team: Farrel Conners, Reese as PCP - General (Family Medicine) Viona Gilmore, [facility] as Pharmacist (Pharmacist)  Recent office visits: 06/17/22 Tiffany Reese: Patient presented for diabetes follow up. A1c decreased to 5.2%. Administered flu shot. Follow up in 6 months.  01/21/22 Tiffany Reese - Patient presented for Medicare Annual Wellness Exam. Patient voiced goal of loosing 100 lbs and changing eating habits.  Recent consult visits: 04/29/22 Tiffany Reese (behavioral health): Patient presented for PTSD and anxiety follow up. Recommended therapy. No medication changes.  01/28/22 Tiffany Reese - Patient presented via video for Bipolar 1 Disorder and other concerns. No medication changes.  Hospital visits: None in previous 6 months   Objective:  Lab Results  Component Value Date   CREATININE 1.21 (H) 06/17/2022   BUN 23 06/17/2022    GFR 49.23 (L) 06/17/2022   GFRNONAA >60 11/05/2018   GFRAA >60 11/05/2018   NA 139 06/17/2022   K 3.9 06/17/2022   CALCIUM 10.1 06/17/2022   CO2 27 06/17/2022   GLUCOSE 88 06/17/2022    Lab Results  Component Value Date/Time   HGBA1C 5.2 06/17/2022 03:20 PM   HGBA1C 6.0 06/10/2021 02:25 PM   HGBA1C 10.2 (A) 03/08/2021 04:57 PM   HGBA1C 7.6 (H) 10/31/2020 03:47 PM   GFR 49.23 (L) 06/17/2022 03:50 PM   GFR 60.67 11/19/2021 02:41 PM   MICROALBUR <0.7 06/17/2022 03:50 PM   MICROALBUR <0.7 06/19/2021 04:05 PM    Last diabetic Eye exam:  Lab Results  Component Value Date/Time   HMDIABEYEEXA No Retinopathy 10/23/2021 12:00 AM    Last diabetic Foot exam: No results found for: "HMDIABFOOTEX"   Lab Results  Component Value Date   CHOL 212 (H) 06/17/2022   HDL 59.00 06/17/2022   LDLCALC 114 (H) 06/17/2022   TRIG 196.0 (H) 06/17/2022   CHOLHDL 4 06/17/2022       Latest Ref Rng & Units 06/17/2022    3:50 PM 11/19/2021    2:41 PM 06/10/2021    2:25 PM  Hepatic Function  Total Protein 6.0 - 8.3 g/dL 8.2  8.5  8.6   Albumin 3.5 - 5.2 g/dL 4.7  4.9  4.8   AST 0 - 37 U/L 27  41  51   ALT 0 - 35 U/L 40  59  79   Alk Phosphatase 39 - 117 U/L 149  163  147   Total Bilirubin 0.2 - 1.2  mg/dL 0.3  0.5  0.5     Lab Results  Component Value Date/Time   TSH 2.71 10/31/2020 03:47 PM   TSH 1.89 02/27/2020 12:54 PM       Latest Ref Rng & Units 11/19/2021    2:41 PM 10/31/2020    3:47 PM 02/27/2020   12:54 PM  CBC  WBC 4.0 - 10.5 K/uL 7.9  7.8  7.3   Hemoglobin 12.0 - 15.0 g/dL 15.3  14.3  13.2   Hematocrit 36.0 - 46.0 % 45.3  43.0  39.7   Platelets 150.0 - 400.0 K/uL 252.0  212.0  210.0     Lab Results  Component Value Date/Time   VD25OH 49.43 02/27/2020 12:54 PM   VD25OH 32.38 11/01/2018 11:12 AM    Clinical ASCVD: No  The 10-year ASCVD risk score (Arnett DK, et al., 2019) is: 4.7%   Values used to calculate the score:     Age: 16 years     Sex: Female     Is Non-Hispanic  African American: No     Diabetic: Yes     Tobacco smoker: No     Systolic Blood Pressure: 676 mmHg     Is BP treated: Yes     HDL Cholesterol: 59 mg/dL     Total Cholesterol: 212 mg/dL       06/17/2022    3:18 PM 01/21/2022    2:13 PM 05/20/2021    1:17 AM  Depression screen PHQ 2/9  Decreased Interest 2 0 1  Down, Depressed, Hopeless _0 PHQ - 2 Score _1 Altered sleeping 1  1  Tired, decreased energy 2  1  Change in appetite 2  0  Feeling bad or failure about yourself  2  1  Trouble concentrating 2  1  Moving slowly or fidgety/restless 1  0  Suicidal thoughts 0  1  PHQ-9 Score 14  7  Difficult doing work/chores Somewhat difficult  Extremely dIfficult      Social History   Tobacco Use  Smoking Status Never  Smokeless Tobacco Never   BP Readings from Last 3 Encounters:  06/17/22 (!) 100/58  11/19/21 124/64  06/19/21 100/78   Pulse Readings from Last 3 Encounters:  06/17/22 88  11/19/21 85  06/19/21 84   Wt Readings from Last 3 Encounters:  06/17/22 247 lb 11.2 oz (112.4 kg)  04/29/22 240 lb (108.9 kg)  01/28/22 240 lb (108.9 kg)   BMI Readings from Last 3 Encounters:  06/17/22 39.38 kg/m  04/29/22 38.16 kg/m  01/28/22 38.16 kg/m    Assessment/Interventions: Review of patient past medical history, allergies, medications, health status, including review of consultants reports, laboratory and other test data, was performed as part of comprehensive evaluation and provision of chronic care management services.   SDOH:  (Social Determinants of Health) assessments and interventions performed: Yes  SDOH Interventions    Flowsheet Row Chronic Care Management from 06/23/2022 in [place] at [place] from 06/17/2022 in [place] [facility] [place] from 01/21/2022 in [place] at [place] from 05/15/2021 in confidential department Counselor from 03/13/2021 in confidential department Office Visit from  03/08/2021 in [place] at [place] Interventions -- -- Intervention Not Indicated -- -- --  Housing Interventions -- -- Intervention Not Indicated -- -- --  Transportation Interventions -- -- Intervention Not Indicated -- -- --  Depression Interventions/Treatment  --  Currently on Treatment -- Medication, Counseling, Currently on Treatment Medication, Counseling, Currently on Treatment Medication, Counseling, Currently on Treatment  Financial Strain Interventions Intervention Not Indicated -- Intervention Not Indicated -- -- --  Physical Activity Interventions -- -- Intervention Not Indicated -- -- --  Stress Interventions -- -- Other (Comment)  [Followed by Dr Arfeen] -- -- --  Social Connections Interventions -- -- Intervention Not Indicated -- -- --      SDOH Screenings   Food Insecurity: No Food Insecurity (01/21/2022)  Housing: Low Risk  (01/21/2022)  Transportation Needs: No Transportation Needs (01/21/2022)  Alcohol Screen: Low Risk  (01/21/2022)  Depression (PHQ2-9): High Risk (06/17/2022)  Financial Resource Strain: Low Risk  (06/23/2022)  Physical Activity: Inactive (01/21/2022)  Social Connections: Socially Isolated (01/21/2022)  Stress: Stress Concern Present (01/21/2022)  Tobacco Use: Low Risk  (06/17/2022)    Regino Ramirez  Allergies  Allergen Reactions   Bupropion Other (See Comments)    Causes agitation   Duloxetine Other (See Comments)    Causes involuntary movements   Sulfa Antibiotics    Metformin And Related     vomiting   Paxil [Paroxetine Hcl]     Insomnia; manic   Prozac [Fluoxetine Hcl]     Hyperactive, insomnia   Trintellix [Vortioxetine]     Triggers fibromyalgia   Zoloft [Sertraline Hcl]     Hyperactivity, insomnia    Medications Reviewed Today     Reviewed by Farrel Conners, Reese (Physician) on 06/17/22 at [place] List Status: <None>   Medication Order Taking? Sig Documenting Provider Last Dose  Status Informant  ACCU-CHEK AVIVA PLUS test strip [id number] Yes USE AS DIRECTED UP TO 4 TIMES A DAY Lucretia Kern, DO Taking Active   amitriptyline (ELAVIL) 50 MG tablet [id number] Yes Take 1 tablet (50 mg total) by mouth at bedtime. [name], Reese Taking Active   Blood Pressure Monitoring (BLOOD PRESSURE MONITOR/L CUFF) MISC [id number] Yes 1 Device by Does not apply route daily. Caren Macadam, Reese Taking Active   Calcium Citrate-Vitamin D (CALCIUM CITRATE + D PO) [id number] Yes Take 630 mg by mouth. 650 mg of Calcium and 500 units of vitamin D Provider, Historical, Reese Taking Active   cetirizine (ZYRTEC) 10 MG tablet [id number] Yes Take 1 tablet (10 mg total) by mouth daily. Dutch Quint B, FNP Taking Active   diazepam (VALIUM) 2 MG tablet [id number] Yes Take two to three tab as needed for anxiety Arfeen, Arlyce Harman, Reese Taking Active   diclofenac (VOLTAREN) 75 MG EC tablet [id number] Yes TAKE 1 TABLET TWICE A DAY AS NEEDED FOR MODERATE PAIN Farrel Conners, Reese Taking Active   diclofenac Sodium (VOLTAREN) 1 % GEL [id number] Yes Apply topically as needed. Provider, Historical, Reese Taking Active   furosemide (LASIX) 20 MG tablet [id number] Yes TAKE 1 TABLET BY MOUTH EVERY DAY Koberlein, Junell C, Reese Taking Active   levothyroxine (SYNTHROID) 50 MCG tablet [id number] Yes TAKE 1 TABLET BY MOUTH EVERY DAY Koberlein, Junell C, Reese Taking Active   lurasidone (LATUDA) 40 MG TABS tablet [id number] Yes Take 1 tablet (40 mg total) by mouth daily with breakfast. Arfeen, Arlyce Harman, Reese Taking Active   methocarbamol (ROBAXIN) 750 MG tablet [id number] Yes Take 1 tablet (750 mg total) by mouth every 8 (eight) hours as needed for muscle spasms. Farrel Conners, Reese Taking Active   Multiple Vitamins-Minerals (MULTIVITAMIN ADULT PO) [id number] Yes Take 1 tablet by mouth daily. Provider, Historical, Reese Taking Active Self  Omega-3 Fatty Acids (FISH OIL PO) [id number] Yes Take by mouth daily. Provider, Historical, Reese Taking Active    omeprazole (PRILOSEC) 40 MG capsule [id number] Yes Take 1 capsule (40 mg total) by mouth daily. Dutch Quint B, FNP Taking Active   pregabalin (LYRICA) 50 MG capsule [id number] Yes TAKE 1 CAPSULE BY MOUTH TWICE A DAY Koberlein, Junell C, Reese Taking Active   Semaglutide,0.25 or 0.5MG/DOS, (OZEMPIC, 0.25 OR 0.5 MG/DOSE,) 2 MG/1.5ML SOPN [id number]  Inject 0.5 mg into the skin once a week. Farrel Conners, Reese  Active   vitamin E 180 MG (400 UNITS) capsule [id number] Yes Take 400 Units by mouth daily. Provider, Historical, Reese Taking Active             Patient Active Problem List   Diagnosis Date Noted   Elevated d-dimer 11/16/2018   At risk for adverse drug reaction 11/10/2018   GERD (gastroesophageal reflux disease)    Hypertension    Diabetes mellitus type II, controlled ([place])    PTSD (post-traumatic stress disorder) 11/01/2018   Anxiety 11/01/2018   Depression, recurrent ([place]) 11/01/2018   Closed left ankle fracture 11/01/2018   Anemia 11/01/2018   Hypothyroid 11/01/2018   Fatty liver disease, nonalcoholic 47/65/4650   BMI 45.0-49.9, adult ([place]) 04/29/2018   DDD (degenerative disc disease), thoracolumbar 04/29/2018    Immunization History  Administered Date(s) Administered   Influenza,inj,Quad PF,6+ Mos 06/22/2018, 06/19/2021, 06/17/2022   Influenza-Unspecified 07/19/2020   Janssen (J&J) SARS-COV-2 Vaccination 01/25/2020, 07/19/2020   Moderna SARS-COV2 Booster Vaccination 03/14/2021   PNEUMOCOCCAL CONJUGATE-20 06/19/2021   Zoster Recombinat (Shingrix) 05/05/2019, 07/18/2019   Patient's dog Mimi started getting sick at the end of the summer and then she started putting things off with her health because she wanted to take care of Mimi.  Patient is going this week for her liver ultrasound and is supposed to follow up with GI for results.  Patient was supposed to get paperwork to fill out from [facility]. She never got the paperwork filled out and now they  re-sent her the paperwork and she just got it and will fill it out.  Patient went off her diet and put on some weight and wants to go back onto her diet. Discussed that increasing the Ozempic could further help with weight loss and patient will consider once she is in the right mindset.   Conditions to be addressed/monitored:  Hypertension, Hyperlipidemia, Diabetes, GERD, Depression, Anxiety and Allergic Rhinitis  Conditions addressed this visit: Diabetes, Depression  Patient Care Plan: CCM Pharmacy Care Plan     Problem Identified: Problem: Hypertension, Hyperlipidemia, Diabetes, GERD, Depression, Anxiety and Allergic Rhinitis      Long-Range Goal: Patient-Specific Goal   Start Date: 02/20/2021  Expected End Date: 02/20/2022  Recent Progress: On track  Priority: High  Note:   Current Barriers:  Unable to independently monitor therapeutic efficacy Suboptimal therapeutic regimen for cholesterol  Pharmacist Clinical Goal(s):  Patient will maintain control of diabetes as evidenced by A1c and blood sugars  through collaboration with PharmD and provider.   Interventions: 1:1 collaboration with Caren Macadam, Reese regarding development and update of comprehensive plan of care as evidenced by provider attestation and co-signature Inter-disciplinary care team collaboration (see longitudinal plan of care) Comprehensive medication review performed; medication list updated in electronic medical record  Hypertension (BP goal <130/80) -Controlled -Current treatment: Furosemide 20 mg 1 tablet daily - Appropriate, Effective, Safe, Accessible -Medications previously tried: none  -Current home readings: did not provide -Current  dietary habits: did not discuss -Current exercise habits: did not discuss -Denies hypotensive/hypertensive symptoms -Educated on Exercise goal of 150 minutes per week; Importance of home blood pressure monitoring; -Counseled to monitor BP at home weekly, document,  and provide log at future appointments -Counseled on diet and exercise extensively Recommended to continue current medication  Hyperlipidemia: (LDL goal < 70) -Uncontrolled -Current treatment: No medications -Medications previously tried: none  -Current dietary patterns: did not discuss -Current exercise habits: did not discuss -Educated on Cholesterol goals;  Benefits of statin for ASCVD risk reduction; Importance of limiting foods high in cholesterol; Exercise goal of 150 minutes per week; -Counseled on diet and exercise extensively Recommended moderate intensity statin therapy pending results of liver ultrasound.  Diabetes (A1c goal <7%) -Controlled  -Current medications: Ozempic inject 0.5 mg once weekly - Appropriate, Effective, Safe, Accessible -Medications previously tried: [place] (hypoglycemia) -Current home glucose readings fasting glucose: 129 (10:15 pm), 126, 98, 97 post prandial glucose: n/a -Reports hypoglycemic/hyperglycemic symptoms -Current meal patterns:  breakfast: slim fast lunch: did not discuss  dinner: salisbury steak and mashed potatoes; reuben and fries snacks: some pie drinks: no more soda; unsweet tea -Current exercise: not started yet -Educated on A1c and blood sugar goals; Prevention and management of hypoglycemic episodes; Benefits of routine self-monitoring of blood sugar; Carbohydrate counting and/or plate method -Counseled to check feet daily and get yearly eye exams -Counseled on diet and exercise extensively Recommended to continue current medication.  Depression/Anxiety (Goal: minimize symptoms) -Not ideally controlled -Current treatment: Lurasidone 40 mg 1 tablet daily with breakfast - Appropriate, Effective, Safe, Accessible Amitriptyline 50 mg 1 tablet daily at bedtime - Appropriate, Effective, Safe, Accessible Diazepam 2 mg 2-3 tablets as needed for anxiety - Appropriate, Effective, Query Safe, Accessible -Medications previously  tried/failed: n/a -PHQ9: 9 -GAD7: n/a -Educated on Benefits of medication for symptom control Benefits of cognitive-behavioral therapy with or without medication -Recommended to continue current medication Recommended exercise to help with mood. Provided telephone number for [facility].  GERD (Goal: minimize symptoms) -Controlled -Current treatment  Omeprazole 40 mg 1 capsule twice daily - Appropriate, Effective, Safe, Accessible -Medications previously tried: Famotidine (side effects)  -Recommended to continue current medication Counseled on non-pharmacologic management of symptoms such as elevating the head of your bed, avoiding eating 2-3 hours before bed, avoiding triggering foods such as acidic, spicy, or fatty foods, eating smaller meals, and wearing clothes that are loose around the waist  Hypothyroidism (Goal: TSH 0.35-4.5) -Controlled -Current treatment  Levothyroxine 50 mcg 1 tablet daily - Appropriate, Effective, Safe, Accessible -Medications previously tried: none  -Recommended to continue current medication  Pain/muscle spasms (Goal: minimize pain) -Controlled -Current treatment  Pregabalin 50 mg 1 capsule twice daily - Appropriate, Effective, Safe, Accessible Methocarbamol 750 mg 1 tablet every 8 hours as needed - Appropriate, Effective, Safe, Accessible Diclofenac 75 mg 1 tablet twice daily as needed - Appropriate, Effective, Query Safe, Accessible Diclofenac gel 1% apply topically as needed - Appropriate, Effective, Safe, Accessible -Medications previously tried: n/a  -Recommended to continue current medication  Allergic rhinitis (Goal: minimize symptoms) -Controlled -Current treatment  Cetirizine 10 mg 1 tablet daily - Appropriate, Effective, Safe, Accessible -Medications previously tried: none  -Recommended to continue current medication   Health Maintenance -Vaccine gaps: shingrix, COVID booster, Pneumovax -Current therapy:  Multivitamin  1 tablet daily (1000 units of vitamin D) Vitamin E 400 units 1 tablet daily Calcium citrate 650 mg - vitamin D 500 units daily -Educated on Cost vs benefit of  each product must be carefully weighed by individual consumer -Patient is satisfied with current therapy and denies issues -Recommended to continue current medication  Patient Goals/Self-Care Activities Patient will:  - take medications as prescribed check glucose 3 times daily, document, and provide at future appointments engage in dietary modifications by decreasing carb intake  Follow Up Plan: Telephone follow up appointment with care management team member scheduled for: 6 months         Medication Assistance: None required.  Patient affirms current coverage meets needs.  Compliance/Adherence/Medication fill history: Care Gaps: HIV screening, tetanus, mammogram, PAP smear, COVID booster, foot exam A1c: 5.2 (06/17/22) BP- 100/58 06/17/22  Star-Rating Drugs: Semaglutide (Ozempic) 0.5 mg - [facility]/09/22 [facility]  Patient's preferred pharmacy is:  [id number]: [phone]: [phone]  Uses pill box? No - did not ask Pt endorses 90% compliance  We discussed: Current pharmacy is preferred with insurance plan and patient is satisfied with pharmacy services Patient decided to: Continue current medication management strategy  Care Plan and Follow Up Patient Decision:  Patient agrees to Care Plan and Follow-up.  Plan: Telephone follow up appointment with care management team member scheduled for:  6 months  MJeni Salles PharmD BGibson FlatsPharmacist LSpring Gardensat [phone]

## 2022-06-25 ENCOUNTER — Ambulatory Visit (HOSPITAL_COMMUNITY)
Admission: RE | Admit: 2022-06-25 | Discharge: 2022-06-25 | Disposition: A | Discharge status: Home / Self Care | Payer: Medicare HMO | Source: Ambulatory Visit | Attending: Gastroenterology | Admitting: Gastroenterology

## 2022-06-25 DIAGNOSIS — K7689 Other specified diseases of liver: Secondary | ICD-10-CM | POA: Diagnosis not present

## 2022-06-25 DIAGNOSIS — K582 Mixed irritable bowel syndrome: Secondary | ICD-10-CM | POA: Diagnosis not present

## 2022-06-25 DIAGNOSIS — R7989 Other specified abnormal findings of blood chemistry: Secondary | ICD-10-CM | POA: Insufficient documentation

## 2022-06-25 DIAGNOSIS — K760 Fatty (change of) liver, not elsewhere classified: Secondary | ICD-10-CM | POA: Insufficient documentation

## 2022-07-04 ENCOUNTER — Other Ambulatory Visit (HOSPITAL_COMMUNITY): Payer: Self-pay | Admitting: Psychiatry

## 2022-07-04 DIAGNOSIS — F419 Anxiety disorder, unspecified: Secondary | ICD-10-CM

## 2022-07-04 DIAGNOSIS — F4310 Post-traumatic stress disorder, unspecified: Secondary | ICD-10-CM

## 2022-07-15 ENCOUNTER — Telehealth: Payer: Self-pay

## 2022-07-15 NOTE — Telephone Encounter (Signed)
Patient saw embedded pharmacist on 06/23/22 who recommended statin therapy. Sent her a note and will follow up. No upcoming PCP appointment. Please consider assessment for moderate-high intensity statin initiation at the next office visit.  Upon chart review from pharmacist note on 06/23/22: Summary: A1c at goal < 7% LDL not at goal < 70   Recommendations/Changes made from today's visit: -Recommend moderate intensity statin after resolution of elevated LFTs and results of liver ultrasound -Consider increasing Ozempic for further weight loss

## 2022-07-15 NOTE — Telephone Encounter (Signed)
Sure! Her A1C in the office was 5.2 which is under good control, but I could increase the ozempic to 1 mg weekly and add atorvastatin if she is agreeable

## 2022-07-21 NOTE — Telephone Encounter (Signed)
Pt states her back is hurting. Declines to schedule appt at this time. States she will call back to schedule if needed.   Pt states she agreeable to PCP recommendations.  Pt also states she would like to increase her Lyrica dosage due to muscle aches.

## 2022-07-22 ENCOUNTER — Other Ambulatory Visit: Payer: Self-pay | Admitting: Family Medicine

## 2022-07-22 DIAGNOSIS — E118 Type 2 diabetes mellitus with unspecified complications: Secondary | ICD-10-CM

## 2022-07-22 MED ORDER — ATORVASTATIN CALCIUM 40 MG PO TABS: 40.0000 mg | ORAL_TABLET | Freq: Every day | ORAL | 3 refills | Status: DC

## 2022-07-22 NOTE — Telephone Encounter (Signed)
Patient informed of the message below.   Patient requested the Rx for Ozempic 1mg  be sent to the pharmacy unless she is able to double the dosing as she just had the Rx filled for the previous dosing.  Message sent to PCP.

## 2022-07-22 NOTE — Telephone Encounter (Signed)
Patient informed of the message below.

## 2022-07-22 NOTE — Telephone Encounter (Signed)
Atorvastatin 40 mg daily sent. As for the Lyrica it doesn't really help with muscle aches so I would not favor increasing it at this time.

## 2022-07-22 NOTE — Telephone Encounter (Signed)
Left a message for the patient to return my call.  

## 2022-07-22 NOTE — Telephone Encounter (Signed)
Yes she can double the dosing of the medication she just got filled.

## 2022-07-29 ENCOUNTER — Telehealth (HOSPITAL_BASED_OUTPATIENT_CLINIC_OR_DEPARTMENT_OTHER): Payer: Medicare HMO | Admitting: Psychiatry

## 2022-07-29 ENCOUNTER — Other Ambulatory Visit: Payer: Self-pay | Admitting: Family Medicine

## 2022-07-29 ENCOUNTER — Encounter (HOSPITAL_COMMUNITY): Payer: Self-pay | Admitting: Psychiatry

## 2022-07-29 VITALS — Wt 240.0 lb

## 2022-07-29 DIAGNOSIS — F319 Bipolar disorder, unspecified: Secondary | ICD-10-CM | POA: Diagnosis not present

## 2022-07-29 DIAGNOSIS — F4321 Adjustment disorder with depressed mood: Secondary | ICD-10-CM

## 2022-07-29 DIAGNOSIS — F4310 Post-traumatic stress disorder, unspecified: Secondary | ICD-10-CM

## 2022-07-29 DIAGNOSIS — F419 Anxiety disorder, unspecified: Secondary | ICD-10-CM | POA: Diagnosis not present

## 2022-07-29 DIAGNOSIS — M797 Fibromyalgia: Secondary | ICD-10-CM

## 2022-07-29 MED ORDER — AMITRIPTYLINE HCL 50 MG PO TABS: 50.0000 mg | ORAL_TABLET | Freq: Every day | ORAL | 2 refills | Status: DC

## 2022-07-29 MED ORDER — DIAZEPAM 2 MG PO TABS: ORAL_TABLET | ORAL | 2 refills | Status: DC

## 2022-07-29 MED ORDER — LURASIDONE HCL 40 MG PO TABS: 40.0000 mg | ORAL_TABLET | Freq: Every day | ORAL | 2 refills | Status: DC
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
## 2022-07-31 ENCOUNTER — Other Ambulatory Visit: Payer: Self-pay | Admitting: Family Medicine

## 2022-07-31 DIAGNOSIS — M797 Fibromyalgia: Secondary | ICD-10-CM

## 2022-07-31 MED ORDER — PREGABALIN 50 MG PO CAPS: 50.0000 mg | ORAL_CAPSULE | Freq: Two times a day (BID) | ORAL | 1 refills | Status: DC

## 2022-08-05 ENCOUNTER — Other Ambulatory Visit: Payer: Self-pay | Admitting: Family

## 2022-08-05 DIAGNOSIS — K219 Gastro-esophageal reflux disease without esophagitis: Secondary | ICD-10-CM

## 2022-08-07 NOTE — Telephone Encounter (Signed)
Patient calling and requesting a refill omeprazole (PRILOSEC) 40 MG capsule    [id number] [place], [place] - [address] Phone: [phone]  Fax: [phone]

## 2022-08-11 ENCOUNTER — Other Ambulatory Visit: Payer: Self-pay | Admitting: Family Medicine

## 2022-08-11 DIAGNOSIS — M25572 Pain in left ankle and joints of left foot: Secondary | ICD-10-CM

## 2022-09-23 ENCOUNTER — Other Ambulatory Visit: Payer: Self-pay | Admitting: Family Medicine

## 2022-09-24 ENCOUNTER — Other Ambulatory Visit: Payer: Self-pay | Admitting: *Deleted

## 2022-09-24 ENCOUNTER — Telehealth: Payer: Self-pay | Admitting: Family Medicine

## 2022-09-24 MED ORDER — FUROSEMIDE 20 MG PO TABS: 20.0000 mg | ORAL_TABLET | Freq: Every day | ORAL | 1 refills | Status: DC

## 2022-09-24 NOTE — Telephone Encounter (Signed)
Called patient back and patient requested a refill for her Ozempic as she is almost out. She wants to continue on the 0.5 mg dose and reassess at her visit in March with her PCP. Routing to [facility] for refill request.

## 2022-09-24 NOTE — Telephone Encounter (Signed)
Pt called to speak to Pharmacist. Pharmacist was on the other line with a patient. Pt asked for a call back when you can.

## 2022-09-25 ENCOUNTER — Other Ambulatory Visit: Payer: Self-pay | Admitting: Family Medicine

## 2022-09-25 DIAGNOSIS — E118 Type 2 diabetes mellitus with unspecified complications: Secondary | ICD-10-CM

## 2022-09-25 MED ORDER — OZEMPIC (0.25 OR 0.5 MG/DOSE) 2 MG/1.5ML ~~LOC~~ SOPN: 0.5000 mg | PEN_INJECTOR | SUBCUTANEOUS | 5 refills | Status: DC

## 2022-09-25 NOTE — Telephone Encounter (Signed)
Rx sent 

## 2022-09-30 ENCOUNTER — Telehealth: Payer: Self-pay | Admitting: Pharmacist

## 2022-09-30 NOTE — Progress Notes (Signed)
Care Coordination Pharmacy Assistant   Patient ID: Tiffany Reese, female   DOB: [birth year], 62 y.o.   MRN: [id number]  Reason for Encounter: Disease State   Conditions to be addressed/monitored: DMII  Recent office visits:  None  Recent consult visits:  07/29/22 [name] - Patient presented for Bipolar disorder and other concerns. No other visit details available.   Hospital visits:  None in previous 6 months  Medications: Outpatient Encounter Medications as of 09/30/2022  Medication Sig   ACCU-CHEK AVIVA PLUS test strip USE AS DIRECTED UP TO 4 TIMES A DAY   amitriptyline (ELAVIL) 50 MG tablet Take 1 tablet (50 mg total) by mouth at bedtime.   atorvastatin (LIPITOR) 40 MG tablet Take 1 tablet (40 mg total) by mouth daily.   Blood Pressure Monitoring (BLOOD PRESSURE MONITOR/L CUFF) MISC 1 Device by Does not apply route daily.   Calcium Citrate-Vitamin D (CALCIUM CITRATE + D PO) Take 630 mg by mouth. 650 mg of Calcium and 500 units of vitamin D   cetirizine (ZYRTEC) 10 MG tablet TAKE 1 TABLET BY MOUTH EVERY DAY   diazepam (VALIUM) 2 MG tablet Take two to three tab as needed for anxiety   diclofenac (VOLTAREN) 75 MG EC tablet TAKE 1 TABLET TWICE A DAY AS NEEDED FOR MODERATE PAIN   diclofenac Sodium (VOLTAREN) 1 % GEL Apply topically as needed.   furosemide (LASIX) 20 MG tablet Take 1 tablet (20 mg total) by mouth daily.   levothyroxine (SYNTHROID) 50 MCG tablet TAKE 1 TABLET BY MOUTH EVERY DAY   lurasidone (LATUDA) 40 MG TABS tablet Take 1 tablet (40 mg total) by mouth daily with breakfast.   methocarbamol (ROBAXIN) 750 MG tablet Take 1 tablet (750 mg total) by mouth every 8 (eight) hours as needed for muscle spasms.   Multiple Vitamins-Minerals (MULTIVITAMIN ADULT PO) Take 1 tablet by mouth daily.   Omega-3 Fatty Acids (FISH OIL PO) Take by mouth daily.   omeprazole (PRILOSEC) 40 MG capsule TAKE 1 CAPSULE (40 MG TOTAL) BY MOUTH DAILY.   pregabalin (LYRICA) 50 MG capsule  Take 1 capsule (50 mg total) by mouth 2 (two) times daily.   Semaglutide,0.25 or 0.5MG /DOS, 2 MG/1.5ML SOPN Inject 0.5 mg into the skin once a week.   vitamin E 180 MG (400 UNITS) capsule Take 400 Units by mouth daily.   No facility-administered encounter medications on file as of 09/30/2022.   Marland KitchenRecent Relevant Labs: Lab Results  Component Value Date/Time   HGBA1C 5.2 06/17/2022 03:20 PM   HGBA1C 6.0 06/10/2021 02:25 PM   HGBA1C 10.2 (A) 03/08/2021 04:57 PM   HGBA1C 7.6 (H) 10/31/2020 03:47 PM   MICROALBUR <0.7 06/17/2022 03:50 PM   MICROALBUR <0.7 06/19/2021 04:05 PM    Kidney Function Lab Results  Component Value Date/Time   CREATININE 1.21 (H) 06/17/2022 03:50 PM   CREATININE 1.02 11/19/2021 02:41 PM   GFR 49.23 (L) 06/17/2022 03:50 PM   GFRNONAA >60 11/05/2018 05:11 PM   GFRAA >60 11/05/2018 05:11 PM    Current antihyperglycemic regimen:  Ozempic inject 0.5 mg once weekly - Appropriate, Effective, Safe, Accessible Unable to reach for assessment  Care Gaps: Pap Smear - Overdue Foot Exam - Overdue COVID Booster - Overdue Mammogram - Postponed HIV Screen - Postponed BP- 100/58 06/17/22 AWV- 01/21/22 Lab Results  Component Value Date   HGBA1C 5.2 06/17/2022    Star Rating Drugs: Semaglutide (Ozempic) 0.5 mg - [facility]  Laresia Green CMA Clinical Pharmacist Assistant [phone]  

## 2022-10-21 ENCOUNTER — Other Ambulatory Visit: Payer: Self-pay | Admitting: *Deleted

## 2022-10-21 MED ORDER — LEVOTHYROXINE SODIUM 50 MCG PO TABS: 50.0000 ug | ORAL_TABLET | Freq: Every day | ORAL | 1 refills | Status: DC

## 2022-10-28 ENCOUNTER — Encounter (HOSPITAL_COMMUNITY): Payer: Self-pay | Admitting: Psychiatry

## 2022-10-28 ENCOUNTER — Telehealth (HOSPITAL_BASED_OUTPATIENT_CLINIC_OR_DEPARTMENT_OTHER): Payer: Medicare HMO | Admitting: Psychiatry

## 2022-10-28 DIAGNOSIS — F419 Anxiety disorder, unspecified: Secondary | ICD-10-CM | POA: Diagnosis not present

## 2022-10-28 DIAGNOSIS — F319 Bipolar disorder, unspecified: Secondary | ICD-10-CM

## 2022-10-28 DIAGNOSIS — F4310 Post-traumatic stress disorder, unspecified: Secondary | ICD-10-CM

## 2022-10-28 MED ORDER — DIAZEPAM 2 MG PO TABS: ORAL_TABLET | ORAL | 2 refills | Status: DC

## 2022-10-28 MED ORDER — LURASIDONE HCL 40 MG PO TABS: 40.0000 mg | ORAL_TABLET | Freq: Every day | ORAL | 2 refills | Status: DC

## 2022-10-28 MED ORDER — AMITRIPTYLINE HCL 50 MG PO TABS: 50.0000 mg | ORAL_TABLET | Freq: Every day | ORAL | 2 refills | Status: DC
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
## 2022-10-30 ENCOUNTER — Telehealth: Payer: Self-pay

## 2022-10-30 NOTE — Progress Notes (Signed)
Patient ID: Tiffany Reese, female   DOB: Oct 10, 1960, 62 y.o.   MRN: [id number]  Care Management & Coordination Services Pharmacy Team  Reason for Encounter: Diabetes  Contacted patient to discuss diabetes disease state. Unsuccessful outreach. Left voicemail for patient to return call.  Current antihyperglycemic regimen:  Ozempic inject 0.5 mg once weekly    Chart Updates:  Recent office visits:  None   Recent consult visits:  10/28/22 Tiffany Reese T MD - Patient presented via video for Bipolar disorder and other concerns. No other visit details available.    Hospital visits:  None in previous 6 months  Medications: Outpatient Encounter Medications as of 10/30/2022  Medication Sig   ACCU-CHEK AVIVA PLUS test strip USE AS DIRECTED UP TO 4 TIMES A DAY   amitriptyline (ELAVIL) 50 MG tablet Take 1 tablet (50 mg total) by mouth at bedtime.   atorvastatin (LIPITOR) 40 MG tablet Take 1 tablet (40 mg total) by mouth daily.   Blood Pressure Monitoring (BLOOD PRESSURE MONITOR/L CUFF) MISC 1 Device by Does not apply route daily.   Calcium Citrate-Vitamin D (CALCIUM CITRATE + D PO) Take 630 mg by mouth. 650 mg of Calcium and 500 units of vitamin D   cetirizine (ZYRTEC) 10 MG tablet TAKE 1 TABLET BY MOUTH EVERY DAY   diazepam (VALIUM) 2 MG tablet Take two to three tab as needed for anxiety   diclofenac (VOLTAREN) 75 MG EC tablet TAKE 1 TABLET TWICE A DAY AS NEEDED FOR MODERATE PAIN   diclofenac Sodium (VOLTAREN) 1 % GEL Apply topically as needed.   furosemide (LASIX) 20 MG tablet Take 1 tablet (20 mg total) by mouth daily.   levothyroxine (SYNTHROID) 50 MCG tablet Take 1 tablet (50 mcg total) by mouth daily.   lurasidone (LATUDA) 40 MG TABS tablet Take 1 tablet (40 mg total) by mouth daily with breakfast.   methocarbamol (ROBAXIN) 750 MG tablet Take 1 tablet (750 mg total) by mouth every 8 (eight) hours as needed for muscle spasms.   Multiple Vitamins-Minerals (MULTIVITAMIN ADULT PO) Take  1 tablet by mouth daily.   Omega-3 Fatty Acids (FISH OIL PO) Take by mouth daily.   omeprazole (PRILOSEC) 40 MG capsule TAKE 1 CAPSULE (40 MG TOTAL) BY MOUTH DAILY.   pregabalin (LYRICA) 50 MG capsule Take 1 capsule (50 mg total) by mouth 2 (two) times daily.   Semaglutide,0.25 or 0.5MG/DOS, 2 MG/1.5ML SOPN Inject 0.5 mg into the skin once a week.   vitamin E 180 MG (400 UNITS) capsule Take 400 Units by mouth daily.   No facility-administered encounter medications on file as of 10/30/2022.    Recent Relevant Labs: Lab Results  Component Value Date/Time   HGBA1C 5.2 06/17/2022 03:20 PM   HGBA1C 6.0 06/10/2021 02:25 PM   HGBA1C 10.2 (A) 03/08/2021 04:57 PM   HGBA1C 7.6 (H) 10/31/2020 03:47 PM   MICROALBUR <0.7 06/17/2022 03:50 PM   MICROALBUR <0.7 06/19/2021 04:05 PM    Kidney Function Lab Results  Component Value Date/Time   CREATININE 1.21 (H) 06/17/2022 03:50 PM   CREATININE 1.02 11/19/2021 02:41 PM   GFR 49.23 (L) 06/17/2022 03:50 PM   GFRNONAA >60 11/05/2018 05:11 PM   GFRAA >60 11/05/2018 05:11 PM    Star Rating Drugs:  Semaglutide (Ozempic) 0.5 mg - [facility]    Care Gaps: AWV- 01/21/22 Pap Smear - Overdue Foot Exam - Overdue COVID Booster - Overdue Eye Exam - Overdue Mammogram - Postponed HIV Screen -  [place] Clinical Pharmacist Assistant [phone]

## 2022-11-10 ENCOUNTER — Other Ambulatory Visit: Payer: Self-pay | Admitting: Family Medicine

## 2022-11-10 DIAGNOSIS — M25572 Pain in left ankle and joints of left foot: Secondary | ICD-10-CM

## 2022-11-20 ENCOUNTER — Ambulatory Visit: Payer: Medicare HMO | Admitting: Psychology

## 2022-12-03 ENCOUNTER — Other Ambulatory Visit: Payer: Self-pay | Admitting: Family Medicine

## 2022-12-03 DIAGNOSIS — G8929 Other chronic pain: Secondary | ICD-10-CM

## 2022-12-16 ENCOUNTER — Encounter: Payer: Medicare HMO | Admitting: Family Medicine

## 2022-12-23 ENCOUNTER — Other Ambulatory Visit: Payer: Self-pay | Admitting: Family Medicine

## 2022-12-24 ENCOUNTER — Other Ambulatory Visit: Payer: Self-pay | Admitting: Family Medicine

## 2022-12-24 DIAGNOSIS — K219 Gastro-esophageal reflux disease without esophagitis: Secondary | ICD-10-CM

## 2022-12-30 ENCOUNTER — Ambulatory Visit (INDEPENDENT_AMBULATORY_CARE_PROVIDER_SITE_OTHER): Payer: Medicare HMO | Admitting: Family Medicine

## 2022-12-30 ENCOUNTER — Other Ambulatory Visit (HOSPITAL_COMMUNITY)
Admission: RE | Admit: 2022-12-30 | Discharge: 2022-12-30 | Disposition: A | Discharge status: Home / Self Care | Payer: Medicare HMO | Source: Ambulatory Visit | Attending: Family Medicine | Admitting: Family Medicine

## 2022-12-30 ENCOUNTER — Encounter: Payer: Self-pay | Admitting: Family Medicine

## 2022-12-30 VITALS — BP 102/78 | HR 85 | Temp 98.7°F | Ht 64.25 in | Wt 241.0 lb

## 2022-12-30 DIAGNOSIS — Z01419 Encounter for gynecological examination (general) (routine) without abnormal findings: Secondary | ICD-10-CM | POA: Diagnosis not present

## 2022-12-30 DIAGNOSIS — E118 Type 2 diabetes mellitus with unspecified complications: Secondary | ICD-10-CM

## 2022-12-30 DIAGNOSIS — Z1151 Encounter for screening for human papillomavirus (HPV): Secondary | ICD-10-CM | POA: Insufficient documentation

## 2022-12-30 DIAGNOSIS — Z0000 Encounter for general adult medical examination without abnormal findings: Secondary | ICD-10-CM

## 2022-12-30 DIAGNOSIS — Z1231 Encounter for screening mammogram for malignant neoplasm of breast: Secondary | ICD-10-CM

## 2022-12-30 LAB — POCT GLYCOSYLATED HEMOGLOBIN (HGB A1C): Hemoglobin A1C: 5.5 % (ref 4.0–5.6)

## 2022-12-30 NOTE — Patient Instructions (Signed)

## 2022-12-30 NOTE — Progress Notes (Signed)
Complete physical exam  Patient: Tiffany Reese   DOB: 02/23/1961   62 y.o. Female  MRN: [id number]  Subjective:    Chief Complaint  Patient presents with   Annual Exam    Tiffany Reese is a 62 y.o. female who presents today for a complete physical exam. She reports consuming a general diet. The patient does not participate in regular exercise at present. She generally feels fairly well. She reports sleeping fairly well. She does have additional problems to discuss today.    Pt reports a history of nerve pain, states that she has had it off and on for years, states that she recently did have a flare up and it lasted for about 2-3 weeks. States that the left side of her vaginal area, buttock right leg and foot. Pt reports also chronic back pain, especially in between her shoulder blades. She is a patient of Dr. Aundria Rudogers at [facility] for her chronic back pain, I recommended that she make an appointment to see him.   Most recent fall risk assessment:    12/30/2022    1:04 PM  Fall Risk   Falls in the past year? 1  Number falls in past yr: 0  Injury with Fall? 1  Risk for fall due to : No Fall Risks  Follow up Falls evaluation completed     Most recent depression screenings:    12/30/2022    1:03 PM 06/17/2022    3:18 PM  PHQ 2/9 Scores  PHQ - 2 Score 2 4  PHQ- 9 Score 7 14    Dental: No current dental problems  Patient Active Problem List   Diagnosis Date Noted   Elevated d-dimer 11/16/2018   At risk for adverse drug reaction 11/10/2018   GERD (gastroesophageal reflux disease)    Hypertension    Diabetes mellitus type II, controlled    PTSD (post-traumatic stress disorder) 11/01/2018   Anxiety 11/01/2018   Depression, recurrent 11/01/2018   Closed left ankle fracture 11/01/2018   Anemia 11/01/2018   Hypothyroid 11/01/2018   Fatty liver disease, nonalcoholic 09/01/2018   BMI 45.0-49.9, adult 04/29/2018   DDD (degenerative disc disease), thoracolumbar  04/29/2018      Patient Care Team: Karie GeorgesMichael, Lyah Millirons M, MD as PCP - General (Family Medicine) Verner CholPryor, Madeline G, [facility] (Inactive) as Pharmacist (Pharmacist)   Outpatient Medications Prior to Visit  Medication Sig   ACCU-CHEK AVIVA PLUS test strip USE AS DIRECTED UP TO 4 TIMES A DAY   amitriptyline (ELAVIL) 50 MG tablet Take 1 tablet (50 mg total) by mouth at bedtime.   Blood Pressure Monitoring (BLOOD PRESSURE MONITOR/L CUFF) MISC 1 Device by Does not apply route daily.   Calcium Citrate-Vitamin D (CALCIUM CITRATE + D PO) Take 630 mg by mouth. 650 mg of Calcium and 500 units of vitamin D   cetirizine (ZYRTEC) 10 MG tablet TAKE 1 TABLET BY MOUTH EVERY DAY   diazepam (VALIUM) 2 MG tablet Take two to three tab as needed for anxiety   diclofenac (VOLTAREN) 75 MG EC tablet TAKE 1 TABLET TWICE A DAY AS NEEDED FOR MODERATE PAIN   diclofenac Sodium (VOLTAREN) 1 % GEL Apply topically as needed.   furosemide (LASIX) 20 MG tablet Take 1 tablet (20 mg total) by mouth daily.   levothyroxine (SYNTHROID) 50 MCG tablet Take 1 tablet (50 mcg total) by mouth daily.   lurasidone (LATUDA) 40 MG TABS tablet Take 1 tablet (40 mg total) by mouth daily with breakfast.  methocarbamol (ROBAXIN) 750 MG tablet TAKE 1 TABLET BY MOUTH EVERY 8 HOURS AS NEEDED FOR MUSCLE SPASMS   Multiple Vitamins-Minerals (MULTIVITAMIN ADULT PO) Take 1 tablet by mouth daily.   Omega-3 Fatty Acids (FISH OIL PO) Take by mouth daily.   omeprazole (PRILOSEC) 40 MG capsule TAKE 1 CAPSULE (40 MG TOTAL) BY MOUTH DAILY.   pregabalin (LYRICA) 50 MG capsule Take 1 capsule (50 mg total) by mouth 2 (two) times daily.   Semaglutide,0.25 or 0.5MG /DOS, 2 MG/1.5ML SOPN Inject 0.5 mg into the skin once a week.   vitamin E 180 MG (400 UNITS) capsule Take 400 Units by mouth daily.   [DISCONTINUED] atorvastatin (LIPITOR) 40 MG tablet Take 1 tablet (40 mg total) by mouth daily.   No facility-administered medications prior to visit.    Review of Systems   HENT:  Negative for hearing loss.   Eyes:  Negative for blurred vision.  Respiratory:  Negative for shortness of breath.   Cardiovascular:  Negative for chest pain.  Gastrointestinal: Negative.   Genitourinary: Negative.   Musculoskeletal:  Negative for back pain.  Neurological:  Negative for headaches.  Psychiatric/Behavioral:  Negative for depression.        Objective:     BP 102/78 (BP Location: Left Arm, Patient Position: Sitting, Cuff Size: Large)   Pulse 85   Temp 98.7 F (37.1 C) (Oral)   Ht 5' 4.25" (1.632 m)   Wt 241 lb (109.3 kg)   SpO2 98%   BMI 41.05 kg/m    Physical Exam Vitals reviewed. Exam conducted with a chaperone present.  Constitutional:      Appearance: She is obese.  Cardiovascular:     Rate and Rhythm: Normal rate and regular rhythm.     Heart sounds: Normal heart sounds.  Pulmonary:     Effort: Pulmonary effort is normal.     Breath sounds: Normal breath sounds.  Chest:     Chest wall: No mass.  Breasts:    Tanner Score is 5.     Right: Normal. No mass or tenderness.     Left: Normal. No mass or tenderness.  Abdominal:     General: Bowel sounds are normal.     Palpations: Abdomen is soft.  Genitourinary:    General: Normal vulva.     Exam position: Lithotomy position.     Tanner stage (genital): 5.     Vagina: Normal.     Uterus: Absent.      Adnexa: Right adnexa normal and left adnexa normal.     Rectum: Normal.     Comments: Cervix is absent, pap taken of the vaginal cuff. Lymphadenopathy:     Upper Body:     Right upper body: No axillary adenopathy.     Left upper body: No axillary adenopathy.  Neurological:     General: No focal deficit present.     Mental Status: She is alert and oriented to person, place, and time.  Psychiatric:        Mood and Affect: Mood and affect normal.      No results found for any visits on 12/30/22.     Assessment & Plan:    Routine Health Maintenance and Physical Exam  Immunization  History  Administered Date(s) Administered   Influenza,inj,Quad PF,6+ Mos 06/22/2018, 06/19/2021, 06/17/2022   Influenza-Unspecified 07/19/2020   Janssen (J&J) SARS-COV-2 Vaccination 01/25/2020, 07/19/2020   Moderna Covid-19 Vaccine Bivalent Booster 1yrs & up 07/13/2022   Moderna SARS-COV2 Booster Vaccination 03/14/2021  PNEUMOCOCCAL CONJUGATE-20 06/19/2021   Tdap 07/13/2022   Zoster Recombinat (Shingrix) 05/05/2019, 07/18/2019    Health Maintenance  Topic Date Due   PAP SMEAR-Modifier  09/22/2016   COVID-19 Vaccine (5 - 2023-24 season) 09/07/2022   OPHTHALMOLOGY EXAM  10/23/2022   HEMOGLOBIN A1C  12/16/2022   Medicare Annual Wellness (AWV)  01/22/2023   MAMMOGRAM  01/22/2023 (Originally 09/23/2019)   HIV Screening  06/18/2023 (Originally 06/12/1978)   INFLUENZA VACCINE  04/23/2023   FOOT EXAM  06/15/2023   Diabetic kidney evaluation - eGFR measurement  06/18/2023   Diabetic kidney evaluation - Urine ACR  06/18/2023   COLONOSCOPY (Pts 45-4yrs Insurance coverage will need to be confirmed)  02/22/2026   DTaP/Tdap/Td (2 - Td or Tdap) 07/13/2032   Hepatitis C Screening  Completed   Zoster Vaccines- Shingrix  Completed   HPV VACCINES  Aged Out    Discussed health benefits of physical activity, and encouraged her to engage in regular exercise appropriate for her age and condition.  Problem List Items Addressed This Visit       Unprioritized   Diabetes mellitus type II, controlled   Relevant Orders   POC HgB A1c   Other Visit Diagnoses     Routine general medical examination at a health care facility    -  Primary   Well woman exam with routine gynecological exam       Relevant Orders   Cytology - PAP ([place])   Breast cancer screening by mammogram       Relevant Orders   MM 3D SCREENING MAMMOGRAM BILATERAL BREAST     Normal physical exam findings today s/p total hysterectomy. I took a sample of the vaginal cuff for the pap but it is likely that she will not need any  further pap smears.  For her nerve pain I advised she go back to Dr. Aundria Rud for additional injections/ management. Return in 6 months (on 07/01/2023).     Karie Georges, MD

## 2023-01-02 ENCOUNTER — Telehealth: Payer: Self-pay

## 2023-01-02 LAB — CYTOLOGY - PAP
Comment: NEGATIVE
Diagnosis: NEGATIVE
High risk HPV: NEGATIVE

## 2023-01-02 NOTE — Progress Notes (Signed)
Patient ID: Tiffany Reese, female   DOB: December 28, 1960, 62 y.o.   MRN: [id number]  Care Management & Coordination Services Pharmacy Team  Reason for Encounter: Appointment Reminder  Contacted patient to confirm telephone appointment with Milas Kocher, PharmD on 01/05/23 at 3. Spoke with patient on 01/02/2023      Star Rating Drugs:  Semaglutide (Ozempic) 0.5 mg - Last [facility]24/22 [facility]     Care Gaps: COVID Booster - Overdue Eye Exam - Overdue AWV 01/21/22 Mammogram - Postponed HIV Screen - Postponed   Pamala Duffel CMA Clinical Pharmacist Assistant [phone]

## 2023-01-04 NOTE — Progress Notes (Unsigned)
Care Management & Coordination Services Pharmacy Note  01/05/2023 Name:  Tiffany Reese MRN:  [id number] DOB:  [birth year]  Summary: BP at goal <130/80, does admit to occasional dizzy spells A1C at goal <7 LDL not at goal < 100  Recommendations/Changes made from today's visit: -Check BP if experiencing dizziness to ensure BP not dropping too low -Continue to be mindful of carbohydrate intake -ORDER updated lipid panel at next PCP OV in October -Counseled on cholesterol lowering diet  Follow up plan: Pharmacist visit in November   Subjective: Tiffany Reese is an 62 y.o. year old female who is a primary patient of Tiffany Georges, Reese.  The care coordination team was consulted for assistance with disease management and care coordination needs.    Engaged with patient by telephone for follow up visit.  Recent office visits: 12/29/21 Tiffany Conn, Reese - Route general medical exam, STOP Atorvastatin 40mg  by patient preference  Recent consult visits: 10/28/22 Tiffany Reese - Patient presented via video for Bipolar disorder and other concerns. No other visit details available.    07/29/22 Tiffany Reese - Patient presented for Bipolar disorder and other concerns. No other visit details available.     Hospital visits: None in previous 6 months   Objective:  Lab Results  Component Value Date   CREATININE 1.21 (H) 06/17/2022   BUN 23 06/17/2022   GFR 49.23 (L) 06/17/2022   GFRNONAA >60 11/05/2018   GFRAA >60 11/05/2018   NA 139 06/17/2022   K 3.9 06/17/2022   CALCIUM 10.1 06/17/2022   CO2 27 06/17/2022   GLUCOSE 88 06/17/2022    Lab Results  Component Value Date/Time   HGBA1C 5.5 12/30/2022 01:44 PM   HGBA1C 5.2 06/17/2022 03:20 PM   HGBA1C 6.0 06/10/2021 02:25 PM   HGBA1C 7.6 (H) 10/31/2020 03:47 PM   GFR 49.23 (L) 06/17/2022 03:50 PM   GFR 60.67 11/19/2021 02:41 PM   MICROALBUR <0.7 06/17/2022 03:50 PM   MICROALBUR <0.7 06/19/2021 04:05 PM     Last diabetic Eye exam:  Lab Results  Component Value Date/Time   HMDIABEYEEXA No Retinopathy 10/23/2021 12:00 AM    Last diabetic Foot exam: No results found for: "HMDIABFOOTEX"   Lab Results  Component Value Date   CHOL 212 (H) 06/17/2022   HDL 59.00 06/17/2022   LDLCALC 114 (H) 06/17/2022   TRIG 196.0 (H) 06/17/2022   CHOLHDL 4 06/17/2022       Latest Ref Rng & Units 06/17/2022    3:50 PM 11/19/2021    2:41 PM 06/10/2021    2:25 PM  Hepatic Function  Total Protein 6.0 - 8.3 g/dL 8.2  8.5  8.6   Albumin 3.5 - 5.2 g/dL 4.7  4.9  4.8   AST 0 - 37 U/L 27  41  51   ALT 0 - 35 U/L 40  59  79   Alk Phosphatase 39 - 117 U/L 149  163  147   Total Bilirubin 0.2 - 1.2 mg/dL 0.3  0.5  0.5     Lab Results  Component Value Date/Time   TSH 2.71 10/31/2020 03:47 PM   TSH 1.89 02/27/2020 12:54 PM       Latest Ref Rng & Units 11/19/2021    2:41 PM 10/31/2020    3:47 PM 02/27/2020   12:54 PM  CBC  WBC 4.0 - 10.5 K/uL 7.9  7.8  7.3   Hemoglobin 12.0 - 15.0 g/dL 78.2  14.3  13.2   Hematocrit 36.0 - 46.0 % 45.3  43.0  39.7   Platelets 150.0 - 400.0 K/uL 252.0  212.0  210.0     Lab Results  Component Value Date/Time   VD25OH 49.43 02/27/2020 12:54 PM   VD25OH 32.38 11/01/2018 11:12 AM   VITAMINB12 1,211 (H) 02/27/2020 12:54 PM   VITAMINB12 308 11/01/2018 11:12 AM    Clinical ASCVD: No  The 10-year ASCVD risk score (Arnett DK, et al., 2019) is: 4.9%   Values used to calculate the score:     Age: 11 years     Sex: Female     Is Non-Hispanic African American: No     Diabetic: Yes     Tobacco smoker: No     Systolic Blood Pressure: 102 mmHg     Is BP treated: Yes     HDL Cholesterol: 59 mg/dL     Total Cholesterol: 212 mg/dL        05/30/9210    9:41 PM 06/17/2022    3:18 PM 01/21/2022    2:13 PM  Depression screen PHQ 2/9  Decreased Interest 1 2 0  Down, Depressed, Hopeless 1 2 1   PHQ - 2 Score 2 4 1   Altered sleeping 1 1   Tired, decreased energy 3 2   Change in  appetite 0 2   Feeling bad or failure about yourself  1 2   Trouble concentrating 0 2   Moving slowly or fidgety/restless 0 1   Suicidal thoughts 0 0   PHQ-9 Score 7 14   Difficult doing work/chores  Somewhat difficult      Social History   Tobacco Use  Smoking Status Never  Smokeless Tobacco Never   BP Readings from Last 3 Encounters:  12/30/22 102/78  06/17/22 (!) 100/58  11/19/21 124/64   Pulse Readings from Last 3 Encounters:  12/30/22 85  06/17/22 88  11/19/21 85   Wt Readings from Last 3 Encounters:  12/30/22 241 lb (109.3 kg)  10/28/22 235 lb (106.6 kg)  07/29/22 240 lb (108.9 kg)   BMI Readings from Last 3 Encounters:  12/30/22 41.05 kg/m  10/28/22 37.36 kg/m  07/29/22 38.16 kg/m    Allergies  Allergen Reactions   Bupropion Other (See Comments)    Causes agitation   Duloxetine Other (See Comments)    Causes involuntary movements   Sulfa Antibiotics    Metformin And Related     vomiting   Paxil [Paroxetine Hcl]     Insomnia; manic   Prozac [Fluoxetine Hcl]     Hyperactive, insomnia   Trintellix [Vortioxetine]     Triggers fibromyalgia   Zoloft [Sertraline Hcl]     Hyperactivity, insomnia    Medications Reviewed Today     Reviewed by Tiffany Reese, RPH (Pharmacist) on 01/05/23 at 1509  Med List Status: <None>   Medication Order Taking? Sig Documenting Provider Last Dose Status Informant  ACCU-CHEK AVIVA PLUS test strip [id number] No USE AS DIRECTED UP TO 4 TIMES A DAY Tiffany Koyanagi, DO Taking Active   amitriptyline (ELAVIL) 50 MG tablet [id number] No Take 1 tablet (50 mg total) by mouth at bedtime. Tiffany Nipper, Reese Taking Active   Blood Pressure Monitoring (BLOOD PRESSURE MONITOR/L CUFF) MISC [id number] No 1 Device by Does not apply route daily. Tiffany Banker, Reese Taking Active   Calcium Citrate-Vitamin D (CALCIUM CITRATE + D PO) [id number] No Take 630 mg by mouth. 650 mg of Calcium and 500 units of  vitamin D Provider, Historical, Reese  Taking Active   cetirizine (ZYRTEC) 10 MG tablet [id number] No TAKE 1 TABLET BY MOUTH EVERY DAY Tiffany Georges, Reese Taking Active   diazepam (VALIUM) 2 MG tablet [id number] No Take two to three tab as needed for anxiety Arfeen, Phillips Grout, Reese Taking Active   diclofenac (VOLTAREN) 75 MG EC tablet [id number] No TAKE 1 TABLET TWICE A DAY AS NEEDED FOR MODERATE PAIN Tiffany Georges, Reese Taking Active   diclofenac Sodium (VOLTAREN) 1 % GEL [id number] No Apply topically as needed. Provider, Historical, Reese Taking Active   furosemide (LASIX) 20 MG tablet [id number] No Take 1 tablet (20 mg total) by mouth daily. Tiffany Georges, Reese Taking Active   levothyroxine (SYNTHROID) 50 MCG tablet [id number] No Take 1 tablet (50 mcg total) by mouth daily. Tiffany Georges, Reese Taking Active   lurasidone (LATUDA) 40 MG TABS tablet [id number] No Take 1 tablet (40 mg total) by mouth daily with breakfast. Tiffany Nipper, Reese Taking Active   methocarbamol (ROBAXIN) 750 MG tablet [id number] No TAKE 1 TABLET BY MOUTH EVERY 8 HOURS AS NEEDED FOR MUSCLE SPASMS Tiffany Georges, Reese Taking Active   Multiple Vitamins-Minerals (MULTIVITAMIN ADULT PO) [id number] No Take 1 tablet by mouth daily. Provider, Historical, Reese Taking Active Self  Omega-3 Fatty Acids (FISH OIL PO) [id number] No Take by mouth daily. Provider, Historical, Reese Taking Active   omeprazole (PRILOSEC) 40 MG capsule [id number] No TAKE 1 CAPSULE (40 MG TOTAL) BY MOUTH DAILY. Tiffany Georges, Reese Taking Active   pregabalin (LYRICA) 50 MG capsule [id number] No Take 1 capsule (50 mg total) by mouth 2 (two) times daily. Tiffany Georges, Reese Taking Active   Semaglutide,0.25 or 0.5MG /DOS, 2 MG/1.5ML Namon Cirri [id number] No Inject 0.5 mg into the skin once a week. Tiffany Georges, Reese Taking Active   vitamin E 180 MG (400 UNITS) capsule [id number] No Take 400 Units by mouth daily. Provider, Historical, Reese Taking Active             SDOH:  (Social Determinants of Health)  assessments and interventions performed: Yes SDOH Interventions    Flowsheet Row Care Coordination from 01/05/2023 in [facility] Chronic Care Management from 06/23/2022 in [facility] [place] Office Visit from 06/17/2022 in [facility] [place] Clinical Support from 01/21/2022 in [facility] [place] Counselor from 05/15/2021 in confidential department Counselor from 03/13/2021 in confidential department  SDOH Interventions        Food Insecurity Interventions Intervention Not Indicated -- -- Intervention Not Indicated -- --  Housing Interventions Intervention Not Indicated -- -- Intervention Not Indicated -- --  Transportation Interventions -- -- -- Intervention Not Indicated -- --  Depression Interventions/Treatment  -- -- Currently on Treatment -- Medication, Counseling, Currently on Treatment Medication, Counseling, Currently on Treatment  Financial Strain Interventions -- Intervention Not Indicated -- Intervention Not Indicated -- --  Physical Activity Interventions -- -- -- Intervention Not Indicated -- --  Stress Interventions -- -- -- Other (Comment)  [Followed by Dr Arfeen] -- --  Social Connections Interventions -- -- -- Intervention Not Indicated -- --       Medication Assistance: None required.  Patient affirms current coverage meets needs.  Medication Access: Within the past 30 days, how often has patient missed a dose of medication? None Is a pillbox or other method used to improve adherence? Yes  Factors that may affect medication adherence? no barriers  identified Are meds synced by current pharmacy? No  Are meds delivered by current pharmacy? No  Does patient experience delays in picking up medications due to transportation concerns? No   [facility] Reviewed: Is patient disadvantaged to use [facility]?: No  Current Rx insurance plan: [employer] Name and location of Current pharmacy:   [id number] [place],  - [address] [place] [address] Phone: [phone] Fax: [phone]  [facility] services reviewed with patient today?: No  Patient requests to transfer care to [facility]?: No  Reason patient declined to change pharmacies: Not mentioned at this visit  Compliance/Adherence/Medication fill history: Care Gaps: COVID Booster - Overdue Eye Exam - Overdue AWV 01/21/22 Mammogram - Postponed HIV Screen - Postponed  Star-Rating Drugs: Semaglutide (Ozempic) 0.5 mg - [facility]    Assessment/Plan   Hypertension (BP goal <130/80) -Controlled -Current treatment: Furosemide  1 qd Appropriate, Effective, Safe, Accessible -Medications previously tried: Losartan, Metoprolol  -Current home readings: Not checking, bp machine died -Current dietary habits: mindful of salt intake -Current exercise habits: limited to pain -Denies hypotensive/hypertensive symptoms -Educated on BP goals and benefits of medications for prevention of heart attack, stroke and kidney damage; Daily salt intake goal < 2300 mg; Exercise goal of 150 minutes per week; Importance of home blood pressure monitoring; Proper BP monitoring technique; -Counseled to monitor BP at home once weekly, document, and provide log at future appointments -Recommended to continue current medication  Diabetes (A1c goal <7%) -Controlled -Current medications: Ozempic 0.5mg  once a week into the skin - Sun/Mon -Medications previously tried: Trulicity, Januvia -Current home glucose readings Does not check daily -Denies hypoglycemic/hyperglycemic symptoms -Current meal patterns:  No sweet drinks, uses splenda -Current exercise: see above -Educated on A1c and blood sugar goals; Complications of diabetes including kidney damage, retinal damage, and cardiovascular disease; Exercise goal of 150 minutes per week; Prevention and management of  hypoglycemic episodes; -Counseled to check feet daily and get yearly eye exams -Recommended to continue current medication  Query Hyperlipidemia: (LDL goal < 100) -Not ideally controlled -Current treatment: Omega 3 Fish Oil Appropriate, Effective, Safe, Accessible -Medications previously tried: Atorvastatin  -Current dietary patterns: does eat fried foods -Current exercise habits: see above -Educated on Cholesterol goals;  Benefits of statin for ASCVD risk reduction; Importance of limiting foods high in cholesterol; Exercise goal of 150 minutes per week; -Recommended updated lipid panel at PCP visit in October, consider zetia if still elevated  Tiffany Reese Clinical Pharmacist [phone]

## 2023-01-05 ENCOUNTER — Ambulatory Visit: Payer: Medicare HMO | Admitting: Psychology

## 2023-01-05 ENCOUNTER — Ambulatory Visit: Payer: Medicare HMO

## 2023-01-22 ENCOUNTER — Other Ambulatory Visit: Payer: Self-pay | Admitting: Family Medicine

## 2023-01-22 DIAGNOSIS — M797 Fibromyalgia: Secondary | ICD-10-CM

## 2023-01-23 ENCOUNTER — Ambulatory Visit (INDEPENDENT_AMBULATORY_CARE_PROVIDER_SITE_OTHER): Payer: Medicare HMO

## 2023-01-23 VITALS — Ht 67.0 in | Wt 237.0 lb

## 2023-01-23 DIAGNOSIS — Z0000 Encounter for general adult medical examination without abnormal findings: Secondary | ICD-10-CM
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
## 2023-01-23 NOTE — Patient Instructions (Signed)
Tiffany Reese , Thank you for taking time to come for your Medicare Wellness Visit. I appreciate your ongoing commitment to your health goals. Please review the following plan we discussed and let me know if I can assist you in the future.   These are the goals we discussed:  Goals       Patient Stated      01/23/2023, getting back out into the community      Weight (lb) < 200 lb (90.7 kg) (pt-stated)      I would like to lose 100lbs Change my eating habits.        This is a list of the screening recommended for you and due dates:  Health Maintenance  Topic Date Due   Mammogram  09/23/2019   COVID-19 Vaccine (5 - 2023-24 season) 09/07/2022   Eye exam for diabetics  10/23/2022   HIV Screening  06/18/2023*   Flu Shot  04/23/2023   Complete foot exam   06/15/2023   Yearly kidney function blood test for diabetes  06/18/2023   Yearly kidney health urinalysis for diabetes  06/18/2023   Hemoglobin A1C  07/01/2023   Medicare Annual Wellness Visit  01/23/2024   Pap Smear  12/29/2025   Colon Cancer Screening  02/22/2026   DTaP/Tdap/Td vaccine (2 - Td or Tdap) 07/13/2032   Hepatitis C Screening: USPSTF Recommendation to screen - Ages 30-79 yo.  Completed   Zoster (Shingles) Vaccine  Completed   HPV Vaccine  Aged Out  *Topic was postponed. The date shown is not the original due date.    Advanced directives: Advance directive discussed with you today.   Conditions/risks identified: none  Next appointment: Follow up in one year for your annual wellness visit.   Preventive Care 40-64 Years, Female Preventive care refers to lifestyle choices and visits with your health care provider that can promote health and wellness. What does preventive care include? A yearly physical exam. This is also called an annual well check. Dental exams once or twice a year. Routine eye exams. Ask your health care provider how often you should have your eyes checked. Personal lifestyle choices,  including: Daily care of your teeth and gums. Regular physical activity. Eating a healthy diet. Avoiding tobacco and drug use. Limiting alcohol use. Practicing safe sex. Taking low-dose aspirin daily starting at age 87. Taking vitamin and mineral supplements as recommended by your health care provider. What happens during an annual well check? The services and screenings done by your health care provider during your annual well check will depend on your age, overall health, lifestyle risk factors, and family history of disease. Counseling  Your health care provider may ask you questions about your: Alcohol use. Tobacco use. Drug use. Emotional well-being. Home and relationship well-being. Sexual activity. Eating habits. Work and work Astronomer. Method of birth control. Menstrual cycle. Pregnancy history. Screening  You may have the following tests or measurements: Height, weight, and BMI. Blood pressure. Lipid and cholesterol levels. These may be checked every 5 years, or more frequently if you are over 44 years old. Skin check. Lung cancer screening. You may have this screening every year starting at age 35 if you have a 30-pack-year history of smoking and currently smoke or have quit within the past 15 years. Fecal occult blood test (FOBT) of the stool. You may have this test every year starting at age 19. Flexible sigmoidoscopy or colonoscopy. You may have a sigmoidoscopy every 5 years or a colonoscopy every 10  years starting at age 20. Hepatitis C blood test. Hepatitis B blood test. Sexually transmitted disease (STD) testing. Diabetes screening. This is done by checking your blood sugar (glucose) after you have not eaten for a while (fasting). You may have this done every 1-3 years. Mammogram. This may be done every 1-2 years. Talk to your health care provider about when you should start having regular mammograms. This may depend on whether you have a family history of  breast cancer. BRCA-related cancer screening. This may be done if you have a family history of breast, ovarian, tubal, or peritoneal cancers. Pelvic exam and Pap test. This may be done every 3 years starting at age 54. Starting at age 10, this may be done every 5 years if you have a Pap test in combination with an HPV test. Bone density scan. This is done to screen for osteoporosis. You may have this scan if you are at high risk for osteoporosis. Discuss your test results, treatment options, and if necessary, the need for more tests with your health care provider. Vaccines  Your health care provider may recommend certain vaccines, such as: Influenza vaccine. This is recommended every year. Tetanus, diphtheria, and acellular pertussis (Tdap, Td) vaccine. You may need a Td booster every 10 years. Zoster vaccine. You may need this after age 65. Pneumococcal 13-valent conjugate (PCV13) vaccine. You may need this if you have certain conditions and were not previously vaccinated. Pneumococcal polysaccharide (PPSV23) vaccine. You may need one or two doses if you smoke cigarettes or if you have certain conditions. Talk to your health care provider about which screenings and vaccines you need and how often you need them. This information is not intended to replace advice given to you by your health care provider. Make sure you discuss any questions you have with your health care provider. Document Released: 10/05/2015 Document Revised: 05/28/2016 Document Reviewed: 07/10/2015 Elsevier Interactive Patient Education  2017 [employer].    Fall Prevention in the Home Falls can cause injuries. They can happen to people of all ages. There are many things you can do to make your home safe and to help prevent falls. What can I do on the outside of my home? Regularly fix the edges of walkways and driveways and fix any cracks. Remove anything that might make you trip as you walk through a door, such as a  raised step or threshold. Trim any bushes or trees on the path to your home. Use bright outdoor lighting. Clear any walking paths of anything that might make someone trip, such as rocks or tools. Regularly check to see if handrails are loose or broken. Make sure that both sides of any steps have handrails. Any raised decks and porches should have guardrails on the edges. Have any leaves, snow, or ice cleared regularly. Use sand or salt on walking paths during winter. Clean up any spills in your garage right away. This includes oil or grease spills. What can I do in the bathroom? Use night lights. Install grab bars by the toilet and in the tub and shower. Do not use towel bars as grab bars. Use non-skid mats or decals in the tub or shower. If you need to sit down in the shower, use a plastic, non-slip stool. Keep the floor dry. Clean up any water that spills on the floor as soon as it happens. Remove soap buildup in the tub or shower regularly. Attach bath mats securely with double-sided non-slip rug tape. Do not have  throw rugs and other things on the floor that can make you trip. What can I do in the bedroom? Use night lights. Make sure that you have a light by your bed that is easy to reach. Do not use any sheets or blankets that are too big for your bed. They should not hang down onto the floor. Have a firm chair that has side arms. You can use this for support while you get dressed. Do not have throw rugs and other things on the floor that can make you trip. What can I do in the kitchen? Clean up any spills right away. Avoid walking on wet floors. Keep items that you use a lot in easy-to-reach places. If you need to reach something above you, use a strong step stool that has a grab bar. Keep electrical cords out of the way. Do not use floor polish or wax that makes floors slippery. If you must use wax, use non-skid floor wax. Do not have throw rugs and other things on the floor  that can make you trip. What can I do with my stairs? Do not leave any items on the stairs. Make sure that there are handrails on both sides of the stairs and use them. Fix handrails that are broken or loose. Make sure that handrails are as long as the stairways. Check any carpeting to make sure that it is firmly attached to the stairs. Fix any carpet that is loose or worn. Avoid having throw rugs at the top or bottom of the stairs. If you do have throw rugs, attach them to the floor with carpet tape. Make sure that you have a light switch at the top of the stairs and the bottom of the stairs. If you do not have them, ask someone to add them for you. What else can I do to help prevent falls? Wear shoes that: Do not have high heels. Have rubber bottoms. Are comfortable and fit you well. Are closed at the toe. Do not wear sandals. If you use a stepladder: Make sure that it is fully opened. Do not climb a closed stepladder. Make sure that both sides of the stepladder are locked into place. Ask someone to hold it for you, if possible. Clearly mark and make sure that you can see: Any grab bars or handrails. First and last steps. Where the edge of each step is. Use tools that help you move around (mobility aids) if they are needed. These include: Canes. Walkers. Scooters. Crutches. Turn on the lights when you go into a dark area. Replace any light bulbs as soon as they burn out. Set up your furniture so you have a clear path. Avoid moving your furniture around. If any of your floors are uneven, fix them. If there are any pets around you, be aware of where they are. Review your medicines with your doctor. Some medicines can make you feel dizzy. This can increase your chance of falling. Ask your doctor what other things that you can do to help prevent falls. This information is not intended to replace advice given to you by your health care provider. Make sure you discuss any questions you  have with your health care provider. Document Released: 07/05/2009 Document Revised: 02/14/2016 Document Reviewed: 10/13/2014 Elsevier Interactive Patient Education  2017 [employer].

## 2023-01-27 ENCOUNTER — Telehealth (HOSPITAL_BASED_OUTPATIENT_CLINIC_OR_DEPARTMENT_OTHER): Payer: Medicare HMO | Admitting: Psychiatry

## 2023-01-27 ENCOUNTER — Encounter (HOSPITAL_COMMUNITY): Payer: Self-pay | Admitting: Psychiatry

## 2023-01-27 VITALS — Wt 237.0 lb

## 2023-01-27 DIAGNOSIS — F419 Anxiety disorder, unspecified: Secondary | ICD-10-CM | POA: Diagnosis not present

## 2023-01-27 DIAGNOSIS — F319 Bipolar disorder, unspecified: Secondary | ICD-10-CM

## 2023-01-27 DIAGNOSIS — F4310 Post-traumatic stress disorder, unspecified: Secondary | ICD-10-CM | POA: Diagnosis not present

## 2023-01-27 MED ORDER — LURASIDONE HCL 40 MG PO TABS: 40.0000 mg | ORAL_TABLET | Freq: Every day | ORAL | 2 refills | Status: DC

## 2023-01-27 MED ORDER — AMITRIPTYLINE HCL 50 MG PO TABS: 50.0000 mg | ORAL_TABLET | Freq: Every day | ORAL | 2 refills | Status: DC

## 2023-01-27 MED ORDER — DIAZEPAM 2 MG PO TABS: ORAL_TABLET | ORAL | 2 refills | Status: DC
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
## 2023-02-22 ENCOUNTER — Other Ambulatory Visit (HOSPITAL_COMMUNITY): Payer: Self-pay | Admitting: Psychiatry

## 2023-02-22 DIAGNOSIS — F419 Anxiety disorder, unspecified: Secondary | ICD-10-CM

## 2023-02-22 DIAGNOSIS — F4310 Post-traumatic stress disorder, unspecified: Secondary | ICD-10-CM

## 2023-03-20 ENCOUNTER — Other Ambulatory Visit: Payer: Self-pay | Admitting: Family Medicine

## 2023-03-24 ENCOUNTER — Other Ambulatory Visit (HOSPITAL_COMMUNITY): Payer: Self-pay | Admitting: Psychiatry

## 2023-03-24 DIAGNOSIS — F319 Bipolar disorder, unspecified: Secondary | ICD-10-CM

## 2023-04-11 ENCOUNTER — Other Ambulatory Visit: Payer: Self-pay | Admitting: Family Medicine

## 2023-04-11 DIAGNOSIS — K219 Gastro-esophageal reflux disease without esophagitis: Secondary | ICD-10-CM

## 2023-04-22 ENCOUNTER — Other Ambulatory Visit: Payer: Self-pay | Admitting: Family Medicine

## 2023-04-22 DIAGNOSIS — G8929 Other chronic pain: Secondary | ICD-10-CM

## 2023-04-26 ENCOUNTER — Other Ambulatory Visit (HOSPITAL_COMMUNITY): Payer: Self-pay | Admitting: Psychiatry

## 2023-04-26 DIAGNOSIS — F4310 Post-traumatic stress disorder, unspecified: Secondary | ICD-10-CM

## 2023-04-26 DIAGNOSIS — F419 Anxiety disorder, unspecified: Secondary | ICD-10-CM

## 2023-04-28 ENCOUNTER — Other Ambulatory Visit (HOSPITAL_COMMUNITY): Payer: Self-pay

## 2023-04-28 ENCOUNTER — Telehealth (HOSPITAL_COMMUNITY): Payer: Medicare HMO | Admitting: Psychiatry

## 2023-04-28 DIAGNOSIS — F4310 Post-traumatic stress disorder, unspecified: Secondary | ICD-10-CM

## 2023-04-28 DIAGNOSIS — F319 Bipolar disorder, unspecified: Secondary | ICD-10-CM

## 2023-04-28 DIAGNOSIS — F419 Anxiety disorder, unspecified: Secondary | ICD-10-CM

## 2023-04-28 MED ORDER — AMITRIPTYLINE HCL 50 MG PO TABS
50.0000 mg | ORAL_TABLET | Freq: Every day | ORAL | 0 refills | Status: DC
Start: 2023-04-28 — End: 2023-05-27

## 2023-04-28 MED ORDER — DIAZEPAM 2 MG PO TABS
ORAL_TABLET | ORAL | 0 refills | Status: DC
Start: 2023-04-28 — End: 2023-05-27

## 2023-04-28 MED ORDER — LURASIDONE HCL 40 MG PO TABS
40.0000 mg | ORAL_TABLET | Freq: Every day | ORAL | 0 refills | Status: DC
Start: 2023-04-28 — End: 2023-05-27

## 2023-05-07 ENCOUNTER — Other Ambulatory Visit: Payer: Self-pay | Admitting: Family Medicine

## 2023-05-07 DIAGNOSIS — M25572 Pain in left ankle and joints of left foot: Secondary | ICD-10-CM

## 2023-05-09 ENCOUNTER — Other Ambulatory Visit: Payer: Self-pay | Admitting: Family Medicine

## 2023-05-20 ENCOUNTER — Other Ambulatory Visit: Payer: Self-pay | Admitting: Family Medicine

## 2023-05-24 ENCOUNTER — Other Ambulatory Visit (HOSPITAL_COMMUNITY): Payer: Self-pay | Admitting: Psychiatry

## 2023-05-24 DIAGNOSIS — F419 Anxiety disorder, unspecified: Secondary | ICD-10-CM

## 2023-05-24 DIAGNOSIS — F4310 Post-traumatic stress disorder, unspecified: Secondary | ICD-10-CM

## 2023-05-26 ENCOUNTER — Other Ambulatory Visit (HOSPITAL_COMMUNITY): Payer: Self-pay | Admitting: Psychiatry

## 2023-05-26 DIAGNOSIS — F4310 Post-traumatic stress disorder, unspecified: Secondary | ICD-10-CM

## 2023-05-26 DIAGNOSIS — F419 Anxiety disorder, unspecified: Secondary | ICD-10-CM

## 2023-05-27 ENCOUNTER — Other Ambulatory Visit (HOSPITAL_COMMUNITY): Payer: Self-pay

## 2023-05-27 DIAGNOSIS — F4310 Post-traumatic stress disorder, unspecified: Secondary | ICD-10-CM

## 2023-05-27 DIAGNOSIS — F319 Bipolar disorder, unspecified: Secondary | ICD-10-CM

## 2023-05-27 DIAGNOSIS — F419 Anxiety disorder, unspecified: Secondary | ICD-10-CM

## 2023-05-27 MED ORDER — DIAZEPAM 2 MG PO TABS
ORAL_TABLET | ORAL | 0 refills | Status: DC
Start: 2023-05-27 — End: 2023-06-10

## 2023-05-27 MED ORDER — AMITRIPTYLINE HCL 50 MG PO TABS
50.0000 mg | ORAL_TABLET | Freq: Every day | ORAL | 0 refills | Status: DC
Start: 2023-05-27 — End: 2023-06-10

## 2023-05-27 MED ORDER — LURASIDONE HCL 40 MG PO TABS
40.0000 mg | ORAL_TABLET | Freq: Every day | ORAL | 0 refills | Status: DC
Start: 2023-05-27 — End: 2023-06-10

## 2023-06-10 ENCOUNTER — Encounter (HOSPITAL_COMMUNITY): Payer: Self-pay | Admitting: Psychiatry

## 2023-06-10 ENCOUNTER — Telehealth (HOSPITAL_BASED_OUTPATIENT_CLINIC_OR_DEPARTMENT_OTHER): Payer: Medicaid Other | Admitting: Psychiatry

## 2023-06-10 VITALS — Wt 235.0 lb

## 2023-06-10 DIAGNOSIS — F319 Bipolar disorder, unspecified: Secondary | ICD-10-CM

## 2023-06-10 DIAGNOSIS — F419 Anxiety disorder, unspecified: Secondary | ICD-10-CM | POA: Diagnosis not present

## 2023-06-10 DIAGNOSIS — F4310 Post-traumatic stress disorder, unspecified: Secondary | ICD-10-CM | POA: Diagnosis not present

## 2023-06-10 MED ORDER — DIAZEPAM 2 MG PO TABS
ORAL_TABLET | ORAL | 2 refills | Status: DC
Start: 2023-06-10 — End: 2023-09-14

## 2023-06-10 MED ORDER — AMITRIPTYLINE HCL 75 MG PO TABS
75.0000 mg | ORAL_TABLET | Freq: Every day | ORAL | 2 refills | Status: DC
Start: 2023-06-10 — End: 2023-09-08

## 2023-06-10 MED ORDER — LURASIDONE HCL 40 MG PO TABS
40.0000 mg | ORAL_TABLET | Freq: Every day | ORAL | 2 refills | Status: DC
Start: 2023-06-10 — End: 2023-09-14
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
## 2023-06-19 ENCOUNTER — Other Ambulatory Visit: Payer: Self-pay | Admitting: Family Medicine

## 2023-06-21 ENCOUNTER — Other Ambulatory Visit (HOSPITAL_COMMUNITY): Payer: Self-pay | Admitting: Psychiatry

## 2023-06-21 DIAGNOSIS — F4310 Post-traumatic stress disorder, unspecified: Secondary | ICD-10-CM

## 2023-06-21 DIAGNOSIS — F419 Anxiety disorder, unspecified: Secondary | ICD-10-CM

## 2023-06-27 ENCOUNTER — Other Ambulatory Visit: Payer: Self-pay | Admitting: Family Medicine

## 2023-06-27 DIAGNOSIS — E118 Type 2 diabetes mellitus with unspecified complications: Secondary | ICD-10-CM

## 2023-06-29 NOTE — Telephone Encounter (Signed)
Pt had to rsc her appt from 07-01-2023 until 08-14-2023 due to getting heating pump replace

## 2023-07-01 ENCOUNTER — Ambulatory Visit: Payer: Medicare HMO | Admitting: Family Medicine

## 2023-07-03 ENCOUNTER — Other Ambulatory Visit (HOSPITAL_COMMUNITY): Payer: Self-pay | Admitting: Psychiatry

## 2023-07-03 DIAGNOSIS — F4310 Post-traumatic stress disorder, unspecified: Secondary | ICD-10-CM

## 2023-07-03 DIAGNOSIS — F419 Anxiety disorder, unspecified: Secondary | ICD-10-CM

## 2023-07-12 ENCOUNTER — Other Ambulatory Visit: Payer: Self-pay | Admitting: Family Medicine

## 2023-07-12 DIAGNOSIS — G8929 Other chronic pain: Secondary | ICD-10-CM

## 2023-07-12 DIAGNOSIS — K219 Gastro-esophageal reflux disease without esophagitis: Secondary | ICD-10-CM

## 2023-07-15 ENCOUNTER — Other Ambulatory Visit: Payer: Self-pay | Admitting: Family Medicine

## 2023-07-15 DIAGNOSIS — E039 Hypothyroidism, unspecified: Secondary | ICD-10-CM

## 2023-07-15 MED ORDER — LEVOTHYROXINE SODIUM 50 MCG PO TABS: 50.0000 ug | ORAL_TABLET | Freq: Every day | ORAL | 1 refills | Status: DC

## 2023-07-17 ENCOUNTER — Other Ambulatory Visit: Payer: Self-pay | Admitting: Family Medicine

## 2023-07-17 ENCOUNTER — Telehealth: Payer: Self-pay | Admitting: Family Medicine

## 2023-07-17 DIAGNOSIS — K219 Gastro-esophageal reflux disease without esophagitis: Secondary | ICD-10-CM

## 2023-07-17 DIAGNOSIS — M797 Fibromyalgia: Secondary | ICD-10-CM

## 2023-07-17 MED ORDER — OMEPRAZOLE 40 MG PO CPDR
40.0000 mg | DELAYED_RELEASE_CAPSULE | Freq: Every day | ORAL | 0 refills | Status: DC
Start: 2023-07-17 — End: 2023-11-10

## 2023-07-17 NOTE — Telephone Encounter (Signed)
Rx sent for Omeprazole-forwarded to PCP for Lyrica.

## 2023-07-17 NOTE — Telephone Encounter (Signed)
Pt needs a refill for Lyrica and Omeprazole refilled--she needs a refill on the Lyrica by Tuesday.  She was told she needs an appointment first, however she has a physical scheduled for 08/14/23.  She had to reschedule the October appointment because her heat pump went out and it was smelling like something was burning so she had to do something quickly.    Pharmacy- [facility] [address]

## 2023-07-20 MED ORDER — PREGABALIN 50 MG PO CAPS: 50.0000 mg | ORAL_CAPSULE | Freq: Two times a day (BID) | ORAL | 1 refills | Status: DC

## 2023-07-20 NOTE — Telephone Encounter (Signed)
Script sent  

## 2023-07-21 MED ORDER — CETIRIZINE HCL 10 MG PO TABS: 10.0000 mg | ORAL_TABLET | Freq: Every day | ORAL | 0 refills | Status: DC

## 2023-07-21 NOTE — Addendum Note (Signed)
Addended by: Johnella Moloney on: 07/21/2023 01:43 PM   Modules accepted: Orders

## 2023-08-14 ENCOUNTER — Ambulatory Visit (INDEPENDENT_AMBULATORY_CARE_PROVIDER_SITE_OTHER): Payer: Medicare HMO | Admitting: Family Medicine

## 2023-08-14 ENCOUNTER — Encounter: Payer: Self-pay | Admitting: Family Medicine

## 2023-08-14 VITALS — BP 120/70 | HR 85 | Temp 97.7°F | Ht 67.0 in | Wt 228.0 lb

## 2023-08-14 DIAGNOSIS — Z1231 Encounter for screening mammogram for malignant neoplasm of breast: Secondary | ICD-10-CM

## 2023-08-14 DIAGNOSIS — E118 Type 2 diabetes mellitus with unspecified complications: Secondary | ICD-10-CM | POA: Diagnosis not present

## 2023-08-14 DIAGNOSIS — Z7985 Long-term (current) use of injectable non-insulin antidiabetic drugs: Secondary | ICD-10-CM

## 2023-08-14 DIAGNOSIS — M797 Fibromyalgia: Secondary | ICD-10-CM | POA: Diagnosis not present

## 2023-08-14 DIAGNOSIS — E039 Hypothyroidism, unspecified: Secondary | ICD-10-CM

## 2023-08-14 LAB — COMPREHENSIVE METABOLIC PANEL
ALT: 62 U/L — ABNORMAL HIGH (ref 0–35)
AST: 41 U/L — ABNORMAL HIGH (ref 0–37)
Albumin: 4.7 g/dL (ref 3.5–5.2)
Alkaline Phosphatase: 152 U/L — ABNORMAL HIGH (ref 39–117)
BUN: 17 mg/dL (ref 6–23)
CO2: 30 meq/L (ref 19–32)
Calcium: 10.2 mg/dL (ref 8.4–10.5)
Chloride: 99 meq/L (ref 96–112)
Creatinine, Ser: 1.18 mg/dL (ref 0.40–1.20)
GFR: 50.32 mL/min — ABNORMAL LOW (ref >60.00)
Glucose, Bld: 96 mg/dL (ref 70–99)
Potassium: 3.7 meq/L (ref 3.5–5.1)
Sodium: 138 meq/L (ref 135–145)
Total Bilirubin: 0.4 mg/dL (ref 0.2–1.2)
Total Protein: 7.9 g/dL (ref 6.0–8.3)

## 2023-08-14 LAB — MICROALBUMIN / CREATININE URINE RATIO
Creatinine,U: 136.6 mg/dL
Microalb Creat Ratio: 0.5 mg/g (ref 0.0–30.0)
Microalb, Ur: <0.7 mg/dL (ref 0.0–1.9)

## 2023-08-14 LAB — POCT GLYCOSYLATED HEMOGLOBIN (HGB A1C): Hemoglobin A1C: 5.3 % (ref 4.0–5.6)

## 2023-08-14 LAB — TSH: TSH: 4.65 u[IU]/mL (ref 0.35–5.50)

## 2023-08-14 MED ORDER — PREGABALIN 50 MG PO CAPS: 50.0000 mg | ORAL_CAPSULE | Freq: Three times a day (TID) | ORAL | 1 refills | Status: DC

## 2023-08-14 NOTE — Assessment & Plan Note (Signed)
On 50 mcg daily of levothyroxine, will check new TSH for annual surveillance.

## 2023-08-14 NOTE — Assessment & Plan Note (Signed)
Worsening pain throughout, pt thinks that it is due to the weather changes, also taking diclofenac twice a day as needed for pain, will agree to increase the lyrica to 50 mg TID since she tried this and it worked for her. RTC in 6 months for refills.

## 2023-08-14 NOTE — Assessment & Plan Note (Signed)
On ozempic 0.5 mg weekly, well controlled, foot exam performed today and she is UTD on the eye exam. Needs urine microalbumin today and annual surveillance labs.

## 2023-08-14 NOTE — Progress Notes (Signed)
Established Patient Office Visit  Subjective   Patient ID: Tiffany Reese, female    DOB: 1961-03-14  Age: 62 y.o. MRN: [id number]  Chief Complaint  Patient presents with   Medical Management of Chronic Issues   Fall    Patient states she fell 6 days ago, while walking her dog, landed onto the right shoulder, complains of right scapula and wrist pain   Pain    Patient complains of increased pain due to fibromyalgia, requests to increase dose of Lyrica    Pt reports that she fell about 6 days ago, states that she stepped back  and missed the curb then fell backwards and tried to catch herself with her right arm. States that she has gotten some better but it is still sore and hurting.   Patient has underlying fibromyalgia and states that the pain is getting worse, states that it gets worse with the cold weather, has been on 50 mg twice a day for a long time. States that she is trying to be more active, swimming, walking, etc. More.   DM-- pt reports good control of her blood sugars at home. She reports compliance with her Ozempic 0.5 mg weekly. Denies any side effects. Has lost a total of 20 pounds in 1 year. A1C in office today is 5.3, her eye exam is scheduled. Foot exam done today.     Current Outpatient Medications  Medication Instructions   ACCU-CHEK AVIVA PLUS test strip USE AS DIRECTED UP TO 4 TIMES A DAY   amitriptyline (ELAVIL) 75 mg, Oral, Daily at bedtime   Blood Pressure Monitoring (BLOOD PRESSURE MONITOR/L CUFF) MISC 1 Device, Does not apply, Daily   Calcium Citrate-Vitamin D (CALCIUM CITRATE + D PO) 630 mg, Oral, 650 mg of Calcium and 500 units of vitamin D   cetirizine (ZYRTEC) 10 mg, Oral, Daily   diazepam (VALIUM) 2 MG tablet Take two to three tab daily as needed for anxiety   diclofenac (VOLTAREN) 75 MG EC tablet TAKE 1 TABLET TWICE A DAY AS NEEDED FOR MODERATE PAIN   diclofenac Sodium (VOLTAREN) 1 % GEL Topical, As needed   furosemide (LASIX) 20 mg, Oral, Daily    levothyroxine (SYNTHROID) 50 mcg, Oral, Daily   lurasidone (LATUDA) 40 mg, Oral, Daily with breakfast   methocarbamol (ROBAXIN) 750 mg, Oral, Every 8 hours PRN   Multiple Vitamins-Minerals (MULTIVITAMIN ADULT PO) 1 tablet, Oral, Daily   Omega-3 Fatty Acids (FISH OIL PO) Oral, Daily   omeprazole (PRILOSEC) 40 mg, Oral, Daily   Ozempic (0.25 or 0.5 MG/DOSE) 0.5 mg, Subcutaneous, Weekly   pregabalin (LYRICA) 50 mg, Oral, 3 times daily   Semaglutide(0.25 or 0.5MG /DOS) 0.5 mg, Subcutaneous, Weekly   vitamin E (VITAMIN E) 400 Units, Oral, Daily    Patient Active Problem List   Diagnosis Date Noted   Fibromyalgia 08/14/2023   Elevated d-dimer 11/16/2018   At risk for adverse drug reaction 11/10/2018   GERD (gastroesophageal reflux disease)    Hypertension    Diabetes mellitus type II, controlled (HCC)    PTSD (post-traumatic stress disorder) 11/01/2018   Anxiety 11/01/2018   Depression, recurrent (HCC) 11/01/2018   Closed left ankle fracture 11/01/2018   Anemia 11/01/2018   Hypothyroid 11/01/2018   Fatty liver disease, nonalcoholic 09/01/2018   BMI 45.0-49.9, adult (HCC) 04/29/2018   DDD (degenerative disc disease), thoracolumbar 04/29/2018      Review of Systems  All other systems reviewed and are negative.     Objective:  BP 120/70 (BP Location: Left Arm, Patient Position: Sitting, Cuff Size: Large)   Pulse 85   Temp 97.7 F (36.5 C) (Oral)   Ht 5\' 7"  (1.702 m)   Wt 228 lb (103.4 kg)   SpO2 96%   BMI 35.71 kg/m    Physical Exam Vitals reviewed.  Constitutional:      Appearance: Normal appearance. She is well-groomed. She is obese.  Eyes:     Conjunctiva/sclera: Conjunctivae normal.  Neck:     Thyroid: No thyromegaly.  Cardiovascular:     Rate and Rhythm: Normal rate and regular rhythm.     Pulses: Normal pulses.     Heart sounds: S1 normal and S2 normal.  Pulmonary:     Effort: Pulmonary effort is normal.     Breath sounds: Normal breath sounds and  air entry.  Abdominal:     General: Bowel sounds are normal.  Musculoskeletal:     Right lower leg: No edema.     Left lower leg: No edema.  Neurological:     Mental Status: She is alert and oriented to person, place, and time. Mental status is at baseline.     Gait: Gait is intact.  Psychiatric:        Mood and Affect: Mood and affect normal.        Speech: Speech normal.        Behavior: Behavior normal.        Judgment: Judgment normal.    Diabetic Foot Exam - Simple   Simple Foot Form Diabetic Foot exam was performed with the following findings: Yes 08/14/2023 11:21 AM  Visual Inspection No deformities, no ulcerations, no other skin breakdown bilaterally: Yes Sensation Testing Intact to touch and monofilament testing bilaterally: Yes Pulse Check Posterior Tibialis and Dorsalis pulse intact bilaterally: Yes Comments       Results for orders placed or performed in visit on 08/14/23  POC HgB A1c  Result Value Ref Range   Hemoglobin A1C 5.3 4.0 - 5.6 %   HbA1c POC (<> result, manual entry)     HbA1c, POC (prediabetic range)     HbA1c, POC (controlled diabetic range)        The 10-year ASCVD risk score (Arnett DK, et al., 2019) is: 7.4%    Assessment & Plan:  Controlled type 2 diabetes mellitus with complication, without long-term current use of insulin (HCC) Assessment & Plan: On ozempic 0.5 mg weekly, well controlled, foot exam performed today and she is UTD on the eye exam. Needs urine microalbumin today and annual surveillance labs.  Orders: -     POCT glycosylated hemoglobin (Hb A1C) -     Lipid panel; Future -     Microalbumin / creatinine urine ratio -     Comprehensive metabolic panel  Fibromyalgia Assessment & Plan: Worsening pain throughout, pt thinks that it is due to the weather changes, also taking diclofenac twice a day as needed for pain, will agree to increase the lyrica to 50 mg TID since she tried this and it worked for her. RTC in 6 months  for refills.   Orders: -     Pregabalin; Take 1 capsule (50 mg total) by mouth 3 (three) times daily.  Dispense: 270 capsule; Refill: 1  Breast cancer screening by mammogram -     3D Screening Mammogram, Left and Right; Future  Hypothyroidism, unspecified type Assessment & Plan: On 50 mcg daily of levothyroxine, will check new TSH for annual surveillance.  Orders: -     TSH     Return in about 6 months (around 02/11/2024) for DM.    Karie Georges, MD

## 2023-08-17 ENCOUNTER — Telehealth: Payer: Self-pay | Admitting: Family Medicine

## 2023-08-17 NOTE — Telephone Encounter (Signed)
Spoke with the patient and offered to schedule an appointment for evaluation in order to have a diagnostic mammogram.  Patient stated she complained of a painful, indentation near the left breast/chest wall during the last visit.  Message sent to PCP.

## 2023-08-17 NOTE — Telephone Encounter (Signed)
No answer at the patient's home number.

## 2023-08-17 NOTE — Telephone Encounter (Signed)
Pt is requesting that MD send an order for a Bilateral Diagnostic.   Pt stated she wanted to go to [facility] or Radiology.  Please return Pt's call, at your earliest convenience.

## 2023-08-18 NOTE — Telephone Encounter (Signed)
Patient informed of the message below.

## 2023-08-18 NOTE — Telephone Encounter (Signed)
I had ordered her regular screening mammogram at the visit-- she should start with this and if they see something there they will order more testing.

## 2023-09-05 ENCOUNTER — Other Ambulatory Visit (HOSPITAL_COMMUNITY): Payer: Self-pay | Admitting: Psychiatry

## 2023-09-05 DIAGNOSIS — F419 Anxiety disorder, unspecified: Secondary | ICD-10-CM

## 2023-09-05 DIAGNOSIS — F4310 Post-traumatic stress disorder, unspecified: Secondary | ICD-10-CM

## 2023-09-08 ENCOUNTER — Other Ambulatory Visit (HOSPITAL_COMMUNITY): Payer: Self-pay | Admitting: *Deleted

## 2023-09-08 DIAGNOSIS — F4310 Post-traumatic stress disorder, unspecified: Secondary | ICD-10-CM

## 2023-09-08 DIAGNOSIS — F419 Anxiety disorder, unspecified: Secondary | ICD-10-CM

## 2023-09-08 MED ORDER — AMITRIPTYLINE HCL 75 MG PO TABS: 75.0000 mg | ORAL_TABLET | Freq: Every day | ORAL | 0 refills | Status: DC

## 2023-09-14 ENCOUNTER — Telehealth (HOSPITAL_COMMUNITY): Payer: Medicare HMO | Admitting: Psychiatry

## 2023-09-14 ENCOUNTER — Encounter (HOSPITAL_COMMUNITY): Payer: Self-pay | Admitting: Psychiatry

## 2023-09-14 VITALS — Wt 228.0 lb

## 2023-09-14 DIAGNOSIS — F419 Anxiety disorder, unspecified: Secondary | ICD-10-CM

## 2023-09-14 DIAGNOSIS — F319 Bipolar disorder, unspecified: Secondary | ICD-10-CM

## 2023-09-14 DIAGNOSIS — F4310 Post-traumatic stress disorder, unspecified: Secondary | ICD-10-CM | POA: Diagnosis not present

## 2023-09-14 MED ORDER — DIAZEPAM 2 MG PO TABS: ORAL_TABLET | ORAL | 2 refills | Status: DC

## 2023-09-14 MED ORDER — AMITRIPTYLINE HCL 75 MG PO TABS: 75.0000 mg | ORAL_TABLET | Freq: Every day | ORAL | 2 refills | Status: DC

## 2023-09-14 MED ORDER — LURASIDONE HCL 40 MG PO TABS: 40.0000 mg | ORAL_TABLET | Freq: Every day | ORAL | 2 refills | Status: DC
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
## 2023-09-24 ENCOUNTER — Ambulatory Visit

## 2023-10-09 ENCOUNTER — Telehealth: Payer: Self-pay | Admitting: Family Medicine

## 2023-10-09 NOTE — Telephone Encounter (Signed)
Copied from CRM [id number]. Topic: Medical Record Request - Records Request >> Oct 09, 2023  2:34 PM Pascal Lux wrote: Reason for CRM: Patient is requesting a copy of her medication list. Stated she would like to pick it up sometime next week.

## 2023-10-10 ENCOUNTER — Other Ambulatory Visit (HOSPITAL_COMMUNITY): Payer: Self-pay | Admitting: Psychiatry

## 2023-10-10 DIAGNOSIS — F4310 Post-traumatic stress disorder, unspecified: Secondary | ICD-10-CM

## 2023-10-10 DIAGNOSIS — F419 Anxiety disorder, unspecified: Secondary | ICD-10-CM

## 2023-10-12 NOTE — Telephone Encounter (Signed)
Copy of the medication list was left at the front desk for pick up.  No answer at the patient's cell number.

## 2023-10-15 NOTE — Telephone Encounter (Signed)
Per Annabelle Harman, patient came to pick up the list today.

## 2023-10-19 ENCOUNTER — Other Ambulatory Visit: Payer: Self-pay | Admitting: Family Medicine

## 2023-10-19 DIAGNOSIS — G8929 Other chronic pain: Secondary | ICD-10-CM

## 2023-10-22 NOTE — Telephone Encounter (Signed)
Spoke with the patient and informed her of the message below.  Patient stated she is still having problems, needs the muscle relaxer and has an appt with her orthopedic doctor for eval next Friday.  Patient is aware she will be contacted if there is a problem with the refill, otherwise she is will await a response from the pharmacy.  Message sent to PCP.

## 2023-10-22 NOTE — Telephone Encounter (Signed)
Looks like this was given at an acute visit-- would you be able to call the patient and ask if she still needs this muscle relaxer?

## 2023-10-28 ENCOUNTER — Other Ambulatory Visit: Payer: Self-pay | Admitting: Family Medicine

## 2023-10-28 DIAGNOSIS — M25572 Pain in left ankle and joints of left foot: Secondary | ICD-10-CM

## 2023-10-30 DIAGNOSIS — M7918 Myalgia, other site: Secondary | ICD-10-CM | POA: Diagnosis not present

## 2023-10-30 DIAGNOSIS — M5451 Vertebrogenic low back pain: Secondary | ICD-10-CM | POA: Diagnosis not present

## 2023-10-30 DIAGNOSIS — M51369 Other intervertebral disc degeneration, lumbar region without mention of lumbar back pain or lower extremity pain: Secondary | ICD-10-CM | POA: Diagnosis not present

## 2023-11-10 ENCOUNTER — Other Ambulatory Visit: Payer: Self-pay | Admitting: Family Medicine

## 2023-11-10 DIAGNOSIS — K219 Gastro-esophageal reflux disease without esophagitis: Secondary | ICD-10-CM

## 2023-11-13 ENCOUNTER — Other Ambulatory Visit: Payer: Self-pay | Admitting: Family Medicine

## 2023-11-17 ENCOUNTER — Other Ambulatory Visit: Payer: Self-pay | Admitting: Family Medicine

## 2023-12-01 DIAGNOSIS — M5416 Radiculopathy, lumbar region: Secondary | ICD-10-CM | POA: Diagnosis not present

## 2023-12-01 DIAGNOSIS — M79672 Pain in left foot: Secondary | ICD-10-CM | POA: Diagnosis not present

## 2023-12-08 ENCOUNTER — Other Ambulatory Visit (HOSPITAL_COMMUNITY): Payer: Self-pay | Admitting: Psychiatry

## 2023-12-08 ENCOUNTER — Telehealth (HOSPITAL_COMMUNITY): Payer: Self-pay | Admitting: *Deleted

## 2023-12-08 DIAGNOSIS — F4310 Post-traumatic stress disorder, unspecified: Secondary | ICD-10-CM

## 2023-12-08 DIAGNOSIS — F419 Anxiety disorder, unspecified: Secondary | ICD-10-CM

## 2023-12-08 NOTE — Telephone Encounter (Signed)
 Pt called requesting refill of the Valium 2 mg TID PRN. Last e-scribed for #90 on 09/14/23 with 2 additional fills. Pt has an upcoming appointment on 12/14/23. She would like medication be sent to [facility] on [address]. Please review.

## 2023-12-09 MED ORDER — DIAZEPAM 2 MG PO TABS
ORAL_TABLET | ORAL | 0 refills | Status: DC
Start: 2023-12-09 — End: 2023-12-14

## 2023-12-09 NOTE — Telephone Encounter (Signed)
 I sent her prescription and she can pick up on her due date.

## 2023-12-12 ENCOUNTER — Other Ambulatory Visit (HOSPITAL_COMMUNITY): Payer: Self-pay | Admitting: Psychiatry

## 2023-12-12 DIAGNOSIS — F419 Anxiety disorder, unspecified: Secondary | ICD-10-CM

## 2023-12-12 DIAGNOSIS — F4310 Post-traumatic stress disorder, unspecified: Secondary | ICD-10-CM

## 2023-12-14 ENCOUNTER — Other Ambulatory Visit (HOSPITAL_COMMUNITY): Payer: Self-pay

## 2023-12-14 ENCOUNTER — Telehealth: Payer: Self-pay | Admitting: Pharmacist

## 2023-12-14 ENCOUNTER — Encounter (HOSPITAL_COMMUNITY): Payer: Self-pay | Admitting: Psychiatry

## 2023-12-14 ENCOUNTER — Telehealth (HOSPITAL_BASED_OUTPATIENT_CLINIC_OR_DEPARTMENT_OTHER): Payer: Medicare HMO | Admitting: Psychiatry

## 2023-12-14 VITALS — Wt 235.0 lb

## 2023-12-14 DIAGNOSIS — F319 Bipolar disorder, unspecified: Secondary | ICD-10-CM

## 2023-12-14 DIAGNOSIS — F4310 Post-traumatic stress disorder, unspecified: Secondary | ICD-10-CM | POA: Diagnosis not present

## 2023-12-14 DIAGNOSIS — F419 Anxiety disorder, unspecified: Secondary | ICD-10-CM

## 2023-12-14 MED ORDER — AMITRIPTYLINE HCL 75 MG PO TABS: 75.0000 mg | ORAL_TABLET | Freq: Every day | ORAL | 2 refills | Status: DC

## 2023-12-14 MED ORDER — LURASIDONE HCL 40 MG PO TABS: 40.0000 mg | ORAL_TABLET | Freq: Every day | ORAL | 2 refills | Status: DC

## 2023-12-14 MED ORDER — DIAZEPAM 2 MG PO TABS: ORAL_TABLET | ORAL | 2 refills | Status: DC
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
## 2023-12-14 NOTE — Telephone Encounter (Signed)
 Pharmacy Patient Advocate Encounter   Received notification from Patient Pharmacy that prior authorization for Ozempic (0.25 or 0.5 MG/DOSE) 2MG /3ML pen-injectors is required/requested.   Insurance verification completed.   The patient is insured through [facility] .   Per test claim: PA required; PA submitted to above mentioned insurance via CoverMyMeds Key/confirmation #/EOC [id number] Status is pending

## 2023-12-14 NOTE — Telephone Encounter (Signed)
 Pharmacy Patient Advocate Encounter  Received notification from [facility] that Prior Authorization for Ozempic (0.25 or 0.5 MG/DOSE) 2MG /3ML pen-injectors has been APPROVED from 12/14/2023 to 09/21/2024   PA #/Case ID/Reference #:  [id number]

## 2023-12-17 DIAGNOSIS — M5416 Radiculopathy, lumbar region: Secondary | ICD-10-CM | POA: Diagnosis not present

## 2023-12-17 DIAGNOSIS — M79672 Pain in left foot: Secondary | ICD-10-CM | POA: Diagnosis not present

## 2023-12-17 DIAGNOSIS — M2012 Hallux valgus (acquired), left foot: Secondary | ICD-10-CM | POA: Diagnosis not present

## 2023-12-21 ENCOUNTER — Other Ambulatory Visit: Payer: Self-pay | Admitting: Family Medicine

## 2023-12-21 DIAGNOSIS — G8929 Other chronic pain: Secondary | ICD-10-CM

## 2023-12-24 NOTE — Telephone Encounter (Signed)
 Received a progress note from [facility] by Dr. Venita Sheffield, MD. She was seen by left foot deformity.  Daughter advised surgery and x-ray foot.  Given diagnosis of left hallux valgus.  No labs drawn or results available in the progress note.

## 2024-01-03 ENCOUNTER — Other Ambulatory Visit (HOSPITAL_COMMUNITY): Payer: Self-pay | Admitting: Psychiatry

## 2024-01-03 DIAGNOSIS — F419 Anxiety disorder, unspecified: Secondary | ICD-10-CM

## 2024-01-03 DIAGNOSIS — F4310 Post-traumatic stress disorder, unspecified: Secondary | ICD-10-CM

## 2024-01-06 ENCOUNTER — Other Ambulatory Visit: Payer: Self-pay | Admitting: *Deleted

## 2024-01-06 DIAGNOSIS — E039 Hypothyroidism, unspecified: Secondary | ICD-10-CM

## 2024-01-06 MED ORDER — LEVOTHYROXINE SODIUM 50 MCG PO TABS: 50.0000 ug | ORAL_TABLET | Freq: Every day | ORAL | 0 refills | Status: DC

## 2024-01-19 DIAGNOSIS — M5416 Radiculopathy, lumbar region: Secondary | ICD-10-CM | POA: Diagnosis not present

## 2024-01-21 DIAGNOSIS — M5416 Radiculopathy, lumbar region: Secondary | ICD-10-CM | POA: Diagnosis not present

## 2024-01-27 ENCOUNTER — Other Ambulatory Visit (HOSPITAL_COMMUNITY): Payer: Self-pay | Admitting: Psychiatry

## 2024-01-27 ENCOUNTER — Ambulatory Visit (INDEPENDENT_AMBULATORY_CARE_PROVIDER_SITE_OTHER): Payer: Medicare HMO

## 2024-01-27 VITALS — Ht 67.0 in | Wt 235.0 lb

## 2024-01-27 DIAGNOSIS — Z1231 Encounter for screening mammogram for malignant neoplasm of breast: Secondary | ICD-10-CM

## 2024-01-27 DIAGNOSIS — Z0000 Encounter for general adult medical examination without abnormal findings: Secondary | ICD-10-CM

## 2024-01-27 DIAGNOSIS — F319 Bipolar disorder, unspecified: Secondary | ICD-10-CM
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
## 2024-01-27 NOTE — Patient Instructions (Addendum)
 Ms. Frei , Thank you for taking time to come for your Medicare Wellness Visit. I appreciate your ongoing commitment to your health goals. Please review the following plan we discussed and let me know if I can assist you in the future.   Referrals/Orders/Follow-Ups/Clinician Recommendations: Follow up with Dr Candi Chafe for yearly eye exam.  This is a list of the screening recommended for you and due dates:  Health Maintenance  Topic Date Due   HIV Screening  Never done   Mammogram  09/23/2019   Eye exam for diabetics  10/23/2022   COVID-19 Vaccine (6 - 2024-25 season) 07/21/2023   Hemoglobin A1C  02/11/2024   Flu Shot  04/22/2024   Yearly kidney function blood test for diabetes  08/13/2024   Yearly kidney health urinalysis for diabetes  08/13/2024   Complete foot exam   08/13/2024   Medicare Annual Wellness Visit  01/26/2025   Colon Cancer Screening  02/22/2026   Pap with HPV screening  12/30/2027   DTaP/Tdap/Td vaccine (2 - Td or Tdap) 07/13/2032   Pneumococcal Vaccination  Completed   Hepatitis C Screening  Completed   Zoster (Shingles) Vaccine  Completed   HPV Vaccine  Aged Out   Meningitis B Vaccine  Aged Out    Advanced directives: (Declined) Advance directive discussed with you today. Even though you declined this today, please call our office should you change your mind, and we can give you the proper paperwork for you to fill out.  Next Medicare Annual Wellness Visit scheduled for next year: Yes  Have you seen your provider in the last 6 months (3 months if uncontrolled diabetes)? Yes

## 2024-02-11 ENCOUNTER — Ambulatory Visit (INDEPENDENT_AMBULATORY_CARE_PROVIDER_SITE_OTHER): Payer: Medicare HMO | Admitting: Family Medicine

## 2024-02-11 ENCOUNTER — Encounter: Payer: Self-pay | Admitting: Family Medicine

## 2024-02-11 VITALS — BP 128/82 | HR 80 | Temp 98.3°F | Ht 67.0 in | Wt 253.0 lb

## 2024-02-11 DIAGNOSIS — E039 Hypothyroidism, unspecified: Secondary | ICD-10-CM | POA: Diagnosis not present

## 2024-02-11 DIAGNOSIS — N644 Mastodynia: Secondary | ICD-10-CM

## 2024-02-11 DIAGNOSIS — G8929 Other chronic pain: Secondary | ICD-10-CM | POA: Diagnosis not present

## 2024-02-11 DIAGNOSIS — M797 Fibromyalgia: Secondary | ICD-10-CM | POA: Diagnosis not present

## 2024-02-11 DIAGNOSIS — Z7985 Long-term (current) use of injectable non-insulin antidiabetic drugs: Secondary | ICD-10-CM | POA: Diagnosis not present

## 2024-02-11 DIAGNOSIS — E782 Mixed hyperlipidemia: Secondary | ICD-10-CM | POA: Diagnosis not present

## 2024-02-11 DIAGNOSIS — M5441 Lumbago with sciatica, right side: Secondary | ICD-10-CM

## 2024-02-11 DIAGNOSIS — J209 Acute bronchitis, unspecified: Secondary | ICD-10-CM | POA: Diagnosis not present

## 2024-02-11 DIAGNOSIS — K219 Gastro-esophageal reflux disease without esophagitis: Secondary | ICD-10-CM | POA: Diagnosis not present

## 2024-02-11 DIAGNOSIS — E118 Type 2 diabetes mellitus with unspecified complications: Secondary | ICD-10-CM

## 2024-02-11 LAB — POCT GLYCOSYLATED HEMOGLOBIN (HGB A1C): Hemoglobin A1C: 5.5 % (ref 4.0–5.6)

## 2024-02-11 MED ORDER — ALBUTEROL SULFATE HFA 108 (90 BASE) MCG/ACT IN AERS: 2.0000 | INHALATION_SPRAY | Freq: Four times a day (QID) | RESPIRATORY_TRACT | 2 refills | Status: DC | PRN

## 2024-02-11 MED ORDER — PREGABALIN 50 MG PO CAPS
50.0000 mg | ORAL_CAPSULE | Freq: Three times a day (TID) | ORAL | 1 refills | Status: DC
Start: 2024-02-11 — End: 2024-06-13

## 2024-02-11 MED ORDER — OMEPRAZOLE 40 MG PO CPDR: 40.0000 mg | DELAYED_RELEASE_CAPSULE | Freq: Every day | ORAL | 1 refills | Status: DC

## 2024-02-11 MED ORDER — METHOCARBAMOL 750 MG PO TABS
750.0000 mg | ORAL_TABLET | Freq: Three times a day (TID) | ORAL | 1 refills | Status: DC | PRN
Start: 2024-02-11 — End: 2024-04-26

## 2024-02-11 MED ORDER — CEFDINIR 300 MG PO CAPS
300.0000 mg | ORAL_CAPSULE | Freq: Two times a day (BID) | ORAL | 0 refills | Status: AC
Start: 2024-02-11 — End: 2024-02-21

## 2024-02-11 NOTE — Progress Notes (Unsigned)
 Established Patient Office Visit  Subjective   Patient ID: Tiffany Reese, female    DOB: [birth year]  Age: 63 y.o. MRN: [id number]  Chief Complaint  Patient presents with   Cough    Productive with green-yellow sputum x1 month, states she has not tried any OTC medications   Breast Pain    Patient complains of left breast pain and idenion x2 years   Medical Management of Chronic Issues    Pt is here for 6 month follow up.   Pt is reporting new symptoms of cough which is productive of green/yellowish sputum. States that her friend that she is caring for came down with "pneumonia" and thinks she caught it from her. She reports lower back pain, but is getting injections in her spine by Dr. Rexanne Catalina, just recently had one a few weeks ago. Pt states she thinks she initially had fever/chills for the first three days, states she is feeling some SOB, no pain in chest when she breathes. No nasal congestion of sinus pain/pressure.   She is reporting left breast pain that has been going on for about 2 years, states that it is sore to touch, feels like the tissue feels like "a rope" under the left breast. No nipple discharge, but states that there is assymmetry between the right and left breasts.   GERD-- pt reports good symptom control with PPI, reports that if she misses a day of medication her GERD symptoms return and are very uncomfortable.   Chronic pain/ fibromyalgia -- pt reports good control of her pain level with the lyrica . Is seeing Dr. Rexanne Catalina for injections and states that she is feeling better with the injections.    Current Outpatient Medications  Medication Instructions   ACCU-CHEK AVIVA PLUS test strip USE AS DIRECTED UP TO 4 TIMES A DAY   albuterol  (VENTOLIN  HFA) 108 (90 Base) MCG/ACT inhaler 2 puffs, Inhalation, Every 6 hours PRN   amitriptyline  (ELAVIL ) 75 mg, Oral, Daily at bedtime   Blood Pressure Monitoring (BLOOD PRESSURE MONITOR/L CUFF) MISC 1 Device, Does not apply,  Daily   Calcium  Citrate-Vitamin D  (CALCIUM  CITRATE + D PO) 630 mg   cefdinir  (OMNICEF ) 300 mg, Oral, 2 times daily   cetirizine  (ZYRTEC ) 10 mg, Oral, Daily   diazepam  (VALIUM ) 2 MG tablet Take two to three tab daily as needed for anxiety   diclofenac  (VOLTAREN ) 75 MG EC tablet TAKE 1 TABLET TWICE A DAY AS NEEDED FOR MODERATE PAIN   diclofenac  Sodium (VOLTAREN ) 1 % GEL As needed   furosemide  (LASIX ) 20 mg, Oral, Daily   levothyroxine  (SYNTHROID ) 50 mcg, Oral, Daily   lurasidone  (LATUDA ) 40 mg, Oral, Daily with breakfast   methocarbamol  (ROBAXIN ) 750 mg, Oral, Every 8 hours PRN   Multiple Vitamins-Minerals (MULTIVITAMIN ADULT PO) 1 tablet, Daily   Omega-3 Fatty Acids (FISH OIL PO) Daily   omeprazole  (PRILOSEC) 40 mg, Oral, Daily   Ozempic  (0.25 or 0.5 MG/DOSE) 0.5 mg, Subcutaneous, Weekly   pregabalin  (LYRICA ) 50 mg, Oral, 3 times daily   Semaglutide (0.25 or 0.5MG /DOS) 0.5 mg, Subcutaneous, Weekly   vitamin E (VITAMIN E) 400 Units, Daily    Patient Active Problem List   Diagnosis Date Noted   Fibromyalgia 08/14/2023   Elevated d-dimer 11/16/2018   At risk for adverse drug reaction 11/10/2018   GERD (gastroesophageal reflux disease)    Hypertension    Diabetes mellitus type II, controlled (HCC)    PTSD (post-traumatic stress disorder) 11/01/2018   Anxiety 11/01/2018  Depression, recurrent (HCC) 11/01/2018   Closed left ankle fracture 11/01/2018   Anemia 11/01/2018   Hypothyroid 11/01/2018   Fatty liver disease, nonalcoholic 09/01/2018   BMI 45.0-49.9, adult (HCC) 04/29/2018   DDD (degenerative disc disease), thoracolumbar 04/29/2018      Review of Systems  All other systems reviewed and are negative.     Objective:      BP 128/82   Pulse 80   Temp 98.3 F (36.8 C) (Axillary)   Ht 5\' 7"  (1.702 m)   Wt 253 lb (114.8 kg)   SpO2 97%   BMI 39.63 kg/m  {Vitals History (Optional):23777}  Physical Exam Vitals reviewed.  Constitutional:      Appearance: Normal  appearance. She is well-groomed. She is morbidly obese.  Neck:     Thyroid : No thyromegaly.  Cardiovascular:     Rate and Rhythm: Normal rate and regular rhythm.     Heart sounds: S1 normal and S2 normal.  Pulmonary:     Effort: Pulmonary effort is normal.     Breath sounds: Normal air entry. Examination of the right-upper field reveals wheezing. Wheezing present.  Chest:  Breasts:    Breasts are asymmetrical.     Left: Tenderness (tenderness to palpation in the left breast in the right lower quadrant) present. No nipple discharge or skin change.     Comments: Left nipple points downward while right nipple points forward Musculoskeletal:     Right lower leg: No edema.     Left lower leg: No edema.  Neurological:     Mental Status: She is alert and oriented to person, place, and time. Mental status is at baseline.     Gait: Gait is intact.  Psychiatric:        Mood and Affect: Mood and affect normal.        Speech: Speech normal.        Behavior: Behavior normal.        Judgment: Judgment normal.      Results for orders placed or performed in visit on 02/11/24  POC HgB A1c  Result Value Ref Range   Hemoglobin A1C 5.5 4.0 - 5.6 %   HbA1c POC (<> result, manual entry)     HbA1c, POC (prediabetic range)     HbA1c, POC (controlled diabetic range)      {Labs (Optional):23779}  The 10-year ASCVD risk score (Arnett DK, et al., 2019) is: 8.4%    Assessment & Plan:  Controlled type 2 diabetes mellitus with complication, without long-term current use of insulin (HCC) -     POCT glycosylated hemoglobin (Hb A1C) -     Collection capillary blood specimen  Pain of left breast -     MM 3D DIAGNOSTIC MAMMOGRAM BILATERAL BREAST; Future  Fibromyalgia -     Pregabalin ; Take 1 capsule (50 mg total) by mouth 3 (three) times daily.  Dispense: 270 capsule; Refill: 1  Chronic bilateral low back pain with right-sided sciatica -     Methocarbamol ; Take 1 tablet (750 mg total) by mouth  every 8 (eight) hours as needed for muscle spasms.  Dispense: 90 tablet; Refill: 1  Gastroesophageal reflux disease, unspecified whether esophagitis present -     Omeprazole ; Take 1 capsule (40 mg total) by mouth daily.  Dispense: 90 capsule; Refill: 1  Hypothyroidism, unspecified type -     TSH; Future  Mixed hyperlipidemia -     Lipid panel; Future  Acute bronchitis, unspecified organism -  Cefdinir; Take 1 capsule (300 mg total) by mouth 2 (two) times daily for 10 days.  Dispense: 20 capsule; Refill: 0 -     Albuterol Sulfate HFA; Inhale 2 puffs into the lungs every 6 (six) hours as needed for wheezing or shortness of breath.  Dispense: 8 g; Refill: 2     Return in about 6 months (around 08/13/2024).    Aida House, MD

## 2024-02-12 ENCOUNTER — Ambulatory Visit: Payer: Self-pay | Admitting: Family Medicine

## 2024-02-12 ENCOUNTER — Other Ambulatory Visit: Payer: Self-pay | Admitting: Family Medicine

## 2024-02-12 DIAGNOSIS — N644 Mastodynia: Secondary | ICD-10-CM

## 2024-02-12 LAB — LIPID PANEL
Cholesterol: 204 mg/dL — ABNORMAL HIGH (ref 0–200)
HDL: 55.5 mg/dL (ref >39.00)
LDL Cholesterol: 106 mg/dL — ABNORMAL HIGH (ref 0–99)
NonHDL: 148.06
Total CHOL/HDL Ratio: 4
Triglycerides: 209 mg/dL — ABNORMAL HIGH (ref 0.0–149.0)
VLDL: 41.8 mg/dL — ABNORMAL HIGH (ref 0.0–40.0)

## 2024-02-12 LAB — TSH: TSH: 2.2 u[IU]/mL (ref 0.35–5.50)

## 2024-02-15 NOTE — Assessment & Plan Note (Signed)
 On ozempic  0.5 mg weekly, well controlled, foot exam performed today and she is UTD on the eye exam.

## 2024-02-15 NOTE — Assessment & Plan Note (Signed)
 On Lyrica  50 mg TID, currently doing well on this dosing, also seeing her back specialist Dr. Rexanne Catalina for injections, last note from him was reviewed with patient.

## 2024-02-18 ENCOUNTER — Ambulatory Visit

## 2024-02-25 ENCOUNTER — Ambulatory Visit (HOSPITAL_BASED_OUTPATIENT_CLINIC_OR_DEPARTMENT_OTHER): Admitting: Psychiatry

## 2024-02-25 ENCOUNTER — Other Ambulatory Visit: Payer: Self-pay

## 2024-02-25 ENCOUNTER — Encounter (HOSPITAL_COMMUNITY): Payer: Self-pay | Admitting: Psychiatry

## 2024-02-25 DIAGNOSIS — F419 Anxiety disorder, unspecified: Secondary | ICD-10-CM

## 2024-02-25 DIAGNOSIS — F319 Bipolar disorder, unspecified: Secondary | ICD-10-CM

## 2024-02-25 DIAGNOSIS — F4310 Post-traumatic stress disorder, unspecified: Secondary | ICD-10-CM | POA: Diagnosis not present

## 2024-02-25 MED ORDER — AMITRIPTYLINE HCL 75 MG PO TABS: 75.0000 mg | ORAL_TABLET | Freq: Every day | ORAL | 2 refills | Status: DC

## 2024-02-25 MED ORDER — DIAZEPAM 2 MG PO TABS
ORAL_TABLET | ORAL | 2 refills | Status: DC
Start: 2024-02-25 — End: 2024-06-02

## 2024-02-25 MED ORDER — LURASIDONE HCL 60 MG PO TABS: 60.0000 mg | ORAL_TABLET | Freq: Every day | ORAL | 2 refills | Status: DC
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
## 2024-03-01 ENCOUNTER — Ambulatory Visit
Admission: RE | Admit: 2024-03-01 | Discharge: 2024-03-01 | Disposition: A | Discharge status: Home / Self Care | Source: Ambulatory Visit | Attending: Family Medicine

## 2024-03-01 ENCOUNTER — Other Ambulatory Visit: Payer: Self-pay | Admitting: Family Medicine

## 2024-03-01 DIAGNOSIS — N644 Mastodynia: Secondary | ICD-10-CM

## 2024-03-01 DIAGNOSIS — N6320 Unspecified lump in the left breast, unspecified quadrant: Secondary | ICD-10-CM

## 2024-03-01 DIAGNOSIS — N6325 Unspecified lump in the left breast, overlapping quadrants: Secondary | ICD-10-CM | POA: Diagnosis not present

## 2024-03-02 ENCOUNTER — Ambulatory Visit
Admission: RE | Admit: 2024-03-02 | Discharge: 2024-03-02 | Disposition: A | Discharge status: Home / Self Care | Source: Ambulatory Visit | Attending: Family Medicine | Admitting: Family Medicine

## 2024-03-02 ENCOUNTER — Ambulatory Visit
Admission: RE | Admit: 2024-03-02 | Discharge: 2024-03-02 | Discharge status: Home / Self Care | Source: Ambulatory Visit | Attending: Family Medicine

## 2024-03-02 DIAGNOSIS — Z170 Estrogen receptor positive status [ER+]: Secondary | ICD-10-CM | POA: Diagnosis not present

## 2024-03-02 DIAGNOSIS — N6320 Unspecified lump in the left breast, unspecified quadrant: Secondary | ICD-10-CM

## 2024-03-02 DIAGNOSIS — C50812 Malignant neoplasm of overlapping sites of left female breast: Secondary | ICD-10-CM | POA: Diagnosis not present

## 2024-03-02 DIAGNOSIS — R92322 Mammographic fibroglandular density, left breast: Secondary | ICD-10-CM | POA: Diagnosis not present

## 2024-03-02 DIAGNOSIS — N6325 Unspecified lump in the left breast, overlapping quadrants: Secondary | ICD-10-CM | POA: Diagnosis not present

## 2024-03-02 HISTORY — PX: BREAST BIOPSY: SHX20

## 2024-03-03 LAB — SURGICAL PATHOLOGY

## 2024-03-04 ENCOUNTER — Telehealth: Payer: Self-pay | Admitting: *Deleted

## 2024-03-04 NOTE — Telephone Encounter (Signed)
 Spoke to patient to confirm upcoming afternoon [facility] clinic appointment on 6/25, paperwork will be sent via mail.  Gave location and time, also informed patient that the surgeon's office would be calling as well to get information from them similar to the packet that they will be receiving so make sure to do both.  Reminded patient that all providers will be coming to the clinic to see them HERE and if they had any questions to not hesitate to reach back out to myself or their navigators.

## 2024-03-07 ENCOUNTER — Encounter: Payer: Self-pay | Admitting: *Deleted

## 2024-03-07 ENCOUNTER — Telehealth (HOSPITAL_COMMUNITY): Payer: Self-pay

## 2024-03-07 DIAGNOSIS — C50512 Malignant neoplasm of lower-outer quadrant of left female breast: Secondary | ICD-10-CM | POA: Insufficient documentation

## 2024-03-07 NOTE — Telephone Encounter (Signed)
 I returned patient's phone call.  She is nervous and anxious about recent diagnosis of breast cancer.  Her family is in [place] and once they return patient is going to have a follow-up appointment at cancer center to discuss your treatment option.  She feels somewhat dizzy with the Latuda  60 mg.  I recommend to take in the evening few hours before amitriptyline .  Patient agreed with the plan.  I recommend to call back if symptoms worse.

## 2024-03-07 NOTE — Telephone Encounter (Signed)
 Patient feels like her Latuda  may be causing dizziness. Patient said she just found out that she has breast cancer. Her anxiety is very anxious. Patient would like to talk to you please

## 2024-03-14 ENCOUNTER — Telehealth: Payer: Self-pay | Admitting: *Deleted

## 2024-03-14 NOTE — Telephone Encounter (Signed)
 Copied from CRM [id number]. Topic: General - Other >> Mar 14, 2024 10:27 AM Rosina BIRCH wrote: Reason for CRM: patient called stating she just found out that she has breast cancer and she need a copy of her medications. Patient want to pick them up tomorrow. Patient stated last time she came and got her medication list it was not printed out and she had to wait. Patient stated she was upset [phone]

## 2024-03-14 NOTE — Telephone Encounter (Signed)
 No answer at the patient's home number.  Med list was printed, left at the front desk for pick up and sent to PCP as FYI.

## 2024-03-14 NOTE — Telephone Encounter (Signed)
 Patient informed the Rx list was left at the front desk and PCP reviewed the message.

## 2024-03-14 NOTE — Telephone Encounter (Signed)
 Noted- ok to close.

## 2024-03-15 ENCOUNTER — Telehealth (HOSPITAL_COMMUNITY): Payer: Self-pay

## 2024-03-15 ENCOUNTER — Encounter: Payer: Self-pay | Admitting: *Deleted

## 2024-03-15 NOTE — Telephone Encounter (Signed)
 I called patient back and she said she already decreased the Latuda  to 40 mg and she does not want to decrease the Amitriptyline . Patient was looking for an increase on her Valium . I advised patient that she is taking 2 mg 3 times a day and that if she wants to increase that she will need to come in and see the doctor. Patient has an appointment with her oncologist tomorrow and will discuss alternatives with them. I also advised patient to call and get in with a therapist.

## 2024-03-15 NOTE — Telephone Encounter (Signed)
 Patient called on 6/16 to report that her Latuda  may be causing dizziness. Dr. Arfeen advised patient to take in the evening a few hours after the amitriptyline . Patient is calling today to report that is not working. Patient states that when she lays down the room is almost spinning. Patient has a recent dx of cancer and this is causing some severe anxiety for patient. Please review and advise, thank you

## 2024-03-16 ENCOUNTER — Other Ambulatory Visit: Payer: Self-pay | Admitting: *Deleted

## 2024-03-16 ENCOUNTER — Encounter: Payer: Self-pay | Admitting: *Deleted

## 2024-03-16 ENCOUNTER — Encounter: Payer: Self-pay | Admitting: Genetic Counselor

## 2024-03-16 ENCOUNTER — Encounter: Payer: Self-pay | Admitting: Hematology

## 2024-03-16 ENCOUNTER — Inpatient Hospital Stay

## 2024-03-16 ENCOUNTER — Encounter: Payer: Self-pay | Admitting: General Practice

## 2024-03-16 ENCOUNTER — Inpatient Hospital Stay: Attending: Hematology | Admitting: Hematology

## 2024-03-16 ENCOUNTER — Inpatient Hospital Stay: Admitting: Genetic Counselor

## 2024-03-16 ENCOUNTER — Ambulatory Visit
Admission: RE | Admit: 2024-03-16 | Discharge: 2024-03-16 | Disposition: A | Discharge status: Home / Self Care | Source: Ambulatory Visit | Attending: Radiation Oncology | Admitting: Radiation Oncology

## 2024-03-16 VITALS — BP 114/70 | HR 91 | Temp 98.8°F | Resp 15 | Ht 66.5 in | Wt 248.6 lb

## 2024-03-16 DIAGNOSIS — C50512 Malignant neoplasm of lower-outer quadrant of left female breast: Secondary | ICD-10-CM | POA: Diagnosis not present

## 2024-03-16 DIAGNOSIS — C50312 Malignant neoplasm of lower-inner quadrant of left female breast: Secondary | ICD-10-CM | POA: Diagnosis not present

## 2024-03-16 DIAGNOSIS — Z79899 Other long term (current) drug therapy: Secondary | ICD-10-CM | POA: Diagnosis not present

## 2024-03-16 DIAGNOSIS — Z170 Estrogen receptor positive status [ER+]: Secondary | ICD-10-CM

## 2024-03-16 DIAGNOSIS — F4310 Post-traumatic stress disorder, unspecified: Secondary | ICD-10-CM | POA: Diagnosis not present

## 2024-03-16 DIAGNOSIS — C50812 Malignant neoplasm of overlapping sites of left female breast: Secondary | ICD-10-CM | POA: Diagnosis not present

## 2024-03-16 DIAGNOSIS — Z8042 Family history of malignant neoplasm of prostate: Secondary | ICD-10-CM | POA: Insufficient documentation

## 2024-03-16 DIAGNOSIS — E2839 Other primary ovarian failure: Secondary | ICD-10-CM | POA: Diagnosis not present

## 2024-03-16 DIAGNOSIS — Z79811 Long term (current) use of aromatase inhibitors: Secondary | ICD-10-CM | POA: Insufficient documentation

## 2024-03-16 DIAGNOSIS — Z800 Family history of malignant neoplasm of digestive organs: Secondary | ICD-10-CM | POA: Diagnosis not present

## 2024-03-16 DIAGNOSIS — N951 Menopausal and female climacteric states: Secondary | ICD-10-CM | POA: Insufficient documentation

## 2024-03-16 DIAGNOSIS — M797 Fibromyalgia: Secondary | ICD-10-CM | POA: Diagnosis not present

## 2024-03-16 DIAGNOSIS — F419 Anxiety disorder, unspecified: Secondary | ICD-10-CM | POA: Insufficient documentation

## 2024-03-16 DIAGNOSIS — F32A Depression, unspecified: Secondary | ICD-10-CM | POA: Diagnosis not present

## 2024-03-16 DIAGNOSIS — Z87891 Personal history of nicotine dependence: Secondary | ICD-10-CM | POA: Insufficient documentation

## 2024-03-16 LAB — CMP (CANCER CENTER ONLY)
ALT: 54 U/L — ABNORMAL HIGH (ref 0–44)
AST: 39 U/L (ref 15–41)
Albumin: 4.4 g/dL (ref 3.5–5.0)
Alkaline Phosphatase: 112 U/L (ref 38–126)
Anion gap: 8 (ref 5–15)
BUN: 12 mg/dL (ref 6–20)
CO2: 25 mmol/L (ref 22–32)
Calcium: 9.4 mg/dL (ref 8.9–10.3)
Chloride: 105 mmol/L (ref 98–111)
Creatinine: 0.92 mg/dL (ref 0.44–1.00)
GFR, Estimated: >60 mL/min (ref >60)
Glucose, Bld: 127 mg/dL — ABNORMAL HIGH (ref 70–99)
Potassium: 4.1 mmol/L (ref 3.5–5.1)
Sodium: 138 mmol/L (ref 135–145)
Total Bilirubin: 0.4 mg/dL (ref 0.0–1.2)
Total Protein: 7.3 g/dL (ref 6.5–8.1)

## 2024-03-16 LAB — CBC WITH DIFFERENTIAL (CANCER CENTER ONLY)
Abs Immature Granulocytes: 0.01 10*3/uL (ref 0.00–0.07)
Basophils Absolute: 0.1 10*3/uL (ref 0.0–0.1)
Basophils Relative: 1 %
Eosinophils Absolute: 0.1 10*3/uL (ref 0.0–0.5)
Eosinophils Relative: 1 %
HCT: 41.5 % (ref 36.0–46.0)
Hemoglobin: 14.2 g/dL (ref 12.0–15.0)
Immature Granulocytes: 0 %
Lymphocytes Relative: 36 %
Lymphs Abs: 2.3 10*3/uL (ref 0.7–4.0)
MCH: 30.7 pg (ref 26.0–34.0)
MCHC: 34.2 g/dL (ref 30.0–36.0)
MCV: 89.8 fL (ref 80.0–100.0)
Monocytes Absolute: 0.4 10*3/uL (ref 0.1–1.0)
Monocytes Relative: 6 %
Neutro Abs: 3.6 10*3/uL (ref 1.7–7.7)
Neutrophils Relative %: 56 %
Platelet Count: 208 10*3/uL (ref 150–400)
RBC: 4.62 MIL/uL (ref 3.87–5.11)
RDW: 13.4 % (ref 11.5–15.5)
WBC Count: 6.4 10*3/uL (ref 4.0–10.5)
nRBC: 0 % (ref 0.0–0.2)

## 2024-03-16 LAB — GENETIC SCREENING ORDER

## 2024-03-16 MED ORDER — EXEMESTANE 25 MG PO TABS: 25.0000 mg | ORAL_TABLET | Freq: Every day | ORAL | 2 refills | Status: DC

## 2024-03-16 NOTE — Progress Notes (Signed)
 Radiation Oncology         [phone] ________________________________  Name: Tiffany Reese        MRN: [id number]  Date of Service: 03/16/2024 DOB: May 15, 1961  RR:[id number], Heron HERO, MD  Ebbie Cough, MD     REFERRING PHYSICIAN: Ebbie Cough, MD   DIAGNOSIS: The encounter diagnosis was Malignant neoplasm of lower-outer quadrant of left breast of female, estrogen receptor positive (HCC).   HISTORY OF PRESENT ILLNESS: Tiffany Reese is a 63 y.o. female seen in the multidisciplinary breast clinic for a new diagnosis of left breast cancer. The patient was noted to have a palpable mass in the left breast with skin retraction and on 03/01/24 proceeded with diagnostic workup. Mammographically there was a spiculated mass with architectural distortion in the left breast measuring approximately 2.6 cm in size, and there was overlying skin retraction. The entire mass was not fully visualized mammographically. No breast abnormalities were noted in the right breast. By ultrasound in the 6:00 position there was a mass in the 6:00 position of the left breast measuring 3.6 cm in greatest dimension possibly involving the chest wall. No abnormalities in the axilla were appreciated. She underwent a biopsy on 03/02/24 that showed a grade 2 invasive ductal carcinoma with associated intermediate grade DCIS. The invasive tumor was ER/PR positive, HER2 negative with a Ki 67 of 65%. She's seen today to discuss treatment recommendations for her cancer.     PREVIOUS RADIATION THERAPY: No   PAST MEDICAL HISTORY:  Past Medical History:  Diagnosis Date   Anemia    Anxiety    Arthritis    Colitis 2019   Colon polyps    Depression, major    Diabetes mellitus type II, controlled (HCC)    Fibromyalgia    Frequent headaches    used to have severe migraines   GERD (gastroesophageal reflux disease)    Hepatic steatosis    Hypertension    Hypothyroid    Obesity    PTSD (post-traumatic  stress disorder)        PAST SURGICAL HISTORY: Past Surgical History:  Procedure Laterality Date   ANKLE SURGERY Right    fracture repair   BREAST BIOPSY Left 03/02/2024   US  LT BREAST BX W LOC DEV 1ST LESION IMG BX SPEC US  GUIDE 03/02/2024 [facility]   COLONOSCOPY  2015   KIDNEY SURGERY     stenting   ORIF ANKLE FRACTURE Left 11/05/2018   Procedure: OPEN REDUCTION INTERNAL FIXATION (ORIF) LEFT TRIMALLEOLAR ANKLE FRACTURE WITH OR WITHOUT FIXATION OF POSTERIOR LIP, XR 3V LEFT ANKLE;  Surgeon: Beverley Evalene BIRCH, MD;  Location: [facility];  Service: Orthopedics;  Laterality: Left;   VAGINAL HYSTERECTOMY  2015   ovaries remain, benign findings, was done for abnormal pap's     FAMILY HISTORY:  Family History  Problem Relation Age of Onset   COPD Mother 77   Alcohol abuse Mother    CAD Father    Colon cancer Father 70   Ankylosing spondylitis Sister    Drug abuse Sister    Alcohol abuse Sister    Other Maternal Grandmother        lived to be over 100   Lung cancer Maternal Grandfather    Aneurysm Paternal Grandmother        brain     SOCIAL HISTORY:  reports that she has never smoked. She has never used smokeless tobacco. She reports that she does not currently use alcohol. She reports  that she does not currently use drugs. She is accompanied by her daughter Tiffany Reese. She lives in [place] and is divorced.    ALLERGIES: Bupropion, Duloxetine, Sulfa antibiotics, Metformin and related, Paxil [paroxetine hcl], Prozac [fluoxetine hcl], Trintellix [vortioxetine], and Zoloft [sertraline hcl]   MEDICATIONS:  Current Outpatient Medications  Medication Sig Dispense Refill   ACCU-CHEK AVIVA PLUS test strip USE AS DIRECTED UP TO 4 TIMES A DAY 100 strip 1   albuterol  (VENTOLIN  HFA) 108 (90 Base) MCG/ACT inhaler Inhale 2 puffs into the lungs every 6 (six) hours as needed for wheezing or shortness of breath. 8 g 2   amitriptyline  (ELAVIL ) 75 MG tablet Take 1 tablet  (75 mg total) by mouth at bedtime. 30 tablet 2   Blood Pressure Monitoring (BLOOD PRESSURE MONITOR/L CUFF) MISC 1 Device by Does not apply route daily. 1 each 0   Calcium  Citrate-Vitamin D  (CALCIUM  CITRATE + D PO) Take 630 mg by mouth. 650 mg of Calcium  and 500 units of vitamin D  (Patient not taking: Reported on 02/25/2024)     cetirizine  (ZYRTEC ) 10 MG tablet TAKE 1 TABLET BY MOUTH EVERY DAY 90 tablet 1   diazepam  (VALIUM ) 2 MG tablet Take two to three tab daily as needed for anxiety 90 tablet 2   diclofenac  (VOLTAREN ) 75 MG EC tablet TAKE 1 TABLET TWICE A DAY AS NEEDED FOR MODERATE PAIN 180 tablet 1   diclofenac  Sodium (VOLTAREN ) 1 % GEL Apply topically as needed. (Patient not taking: Reported on 02/25/2024)     furosemide  (LASIX ) 20 MG tablet TAKE 1 TABLET BY MOUTH EVERY DAY 90 tablet 1   levothyroxine  (SYNTHROID ) 50 MCG tablet Take 1 tablet (50 mcg total) by mouth daily. 90 tablet 0   Lurasidone  HCl (LATUDA ) 60 MG TABS Take 1 tablet (60 mg total) by mouth daily with breakfast. 30 tablet 2   methocarbamol  (ROBAXIN ) 750 MG tablet Take 1 tablet (750 mg total) by mouth every 8 (eight) hours as needed for muscle spasms. 90 tablet 1   Multiple Vitamins-Minerals (MULTIVITAMIN ADULT PO) Take 1 tablet by mouth daily.     Omega-3 Fatty Acids (FISH OIL PO) Take by mouth daily. (Patient not taking: Reported on 02/25/2024)     omeprazole  (PRILOSEC) 40 MG capsule Take 1 capsule (40 mg total) by mouth daily. 90 capsule 1   pregabalin  (LYRICA ) 50 MG capsule Take 1 capsule (50 mg total) by mouth 3 (three) times daily. 270 capsule 1   Semaglutide ,0.25 or 0.5MG /DOS, (OZEMPIC , 0.25 OR 0.5 MG/DOSE,) 2 MG/3ML SOPN INJECT 0.5 MG INTO THE SKIN ONCE A WEEK. 9 mL 3   Semaglutide ,0.25 or 0.5MG /DOS, 2 MG/1.5ML SOPN Inject 0.5 mg into the skin once a week. 1.5 mL 5   vitamin E 180 MG (400 UNITS) capsule Take 400 Units by mouth daily. (Patient not taking: Reported on 02/25/2024)     No current facility-administered medications for  this visit.     REVIEW OF SYSTEMS: On review of systems, the patient reports that she is doing well overall. She is hoping to avoid chemotherapy in the course of her treatment, but open to discussions. No specific complaints are otherwise noted.      PHYSICAL EXAM:  Wt Readings from Last 3 Encounters:  02/11/24 253 lb (114.8 kg)  01/27/24 235 lb (106.6 kg)  08/14/23 228 lb (103.4 kg)   Temp Readings from Last 3 Encounters:  02/11/24 98.3 F (36.8 C) (Axillary)  08/14/23 97.7 F (36.5 C) (Oral)  12/30/22 98.7 F (37.1  C) (Oral)   BP Readings from Last 3 Encounters:  02/11/24 128/82  08/14/23 120/70  12/30/22 102/78   Pulse Readings from Last 3 Encounters:  02/11/24 80  08/14/23 85  12/30/22 85    In general this is a well appearing caucasian female in no acute distress. She's alert and oriented x4 and appropriate throughout the examination. Cardiopulmonary assessment is negative for acute distress and she exhibits normal effort. Bilateral breast exam is deferred.    ECOG = 1  0 - Asymptomatic (Fully active, able to carry on all predisease activities without restriction)  1 - Symptomatic but completely ambulatory (Restricted in physically strenuous activity but ambulatory and able to carry out work of a light or sedentary nature. For example, light housework, office work)  2 - Symptomatic, <50% in bed during the day (Ambulatory and capable of all self care but unable to carry out any work activities. Up and about more than 50% of waking hours)  3 - Symptomatic, >50% in bed, but not bedbound (Capable of only limited self-care, confined to bed or chair 50% or more of waking hours)  4 - Bedbound (Completely disabled. Cannot carry on any self-care. Totally confined to bed or chair)  5 - Death   Raylene MM, Creech RH, Tormey DC, et al. [phone]). Toxicity and response criteria of the [facility]. Am. DOROTHA Bridges. Oncol. 5 (6): 649-55    LABORATORY DATA:   Lab Results  Component Value Date   WBC 7.9 11/19/2021   HGB 15.3 (H) 11/19/2021   HCT 45.3 11/19/2021   MCV 90.4 11/19/2021   PLT 252.0 11/19/2021   Lab Results  Component Value Date   NA 138 08/14/2023   K 3.7 08/14/2023   CL 99 08/14/2023   CO2 30 08/14/2023   Lab Results  Component Value Date   ALT 62 (H) 08/14/2023   AST 41 (H) 08/14/2023   ALKPHOS 152 (H) 08/14/2023   BILITOT 0.4 08/14/2023      RADIOGRAPHY: US  LT BREAST BX W LOC DEV 1ST LESION IMG BX SPEC US  GUIDE Addendum Date: 03/03/2024 ADDENDUM REPORT: 03/03/2024 14:06 ADDENDUM: Pathology revealed GRADE II INVASIVE DUCTAL CARCINOMA, DUCTAL CARCINOMA IN SITU (DCIS), GRADE II (INTERMEDIATE), WITHOUT NECROSIS, WITH MICROCALCIFICATIONS ASSOCIATED WITH DCIS of the LEFT breast, 6 o'clock, 4 CMFN, (coil clip). This was found to be concordant by Dr. Aliene Mir. Pathology results were discussed with the patient by telephone. The patient reported doing well after the biopsy with moderate tenderness at the site, especially at her chest wall. Post biopsy instructions and care were reviewed and questions were answered. The patient was encouraged to call [facility] for any additional concerns. My direct phone number was provided. The patient was referred to [facility] on March 16, 2024, per patient request. Pathology results reported by Hendricks Benders, RN on 03/03/2024. Electronically Signed   By: Aliene Lloyd M.D.   On: 03/03/2024 14:06   Result Date: 03/03/2024 CLINICAL DATA:  63 year old woman presents for ultrasound-guided core needle biopsy of highly suspicious 3.6 cm LEFT breast mass. EXAM: ULTRASOUND GUIDED LEFT BREAST CORE NEEDLE BIOPSY COMPARISON:  Previous exam(s). PROCEDURE: I met with the patient and we discussed the procedure of ultrasound-guided biopsy, including benefits and alternatives. We discussed the high likelihood of  a successful procedure. We discussed the risks of the procedure, including infection, bleeding, tissue injury, clip migration, and inadequate sampling. Informed written  consent was given. The usual time-out protocol was performed immediately prior to the procedure. Lesion quadrant: Lower inner quadrant Using sterile technique and 1% Lidocaine  as local anesthetic, under direct ultrasound visualization, Two 14 gauge and three 12 gauge cores were obtained from the 3.6 cm LEFT breast mass (6 o'clock 4 CMFN) using a lateral approach. At the conclusion of the procedure coil shaped tissue marker clip was deployed into the biopsy cavity. Follow up 2 view mammogram was performed and dictated separately. IMPRESSION: Ultrasound guided biopsy of 3.6 cm LEFT breast mass (6 o'clock 4 CMFN). No apparent complications. Electronically Signed: By: Aliene Lloyd M.D. On: 03/02/2024 14:40   MM CLIP PLACEMENT LEFT Result Date: 03/02/2024 CLINICAL DATA:  Status post ultrasound-guided biopsy of LEFT breast mass. EXAM: 3D DIAGNOSTIC LEFT MAMMOGRAM POST ULTRASOUND BIOPSY COMPARISON:  Previous exam(s). ACR Breast Density Category b: There are scattered areas of fibroglandular density. FINDINGS: 3D Mammographic images were obtained following ultrasound guided biopsy of 3.6 cm LEFT breast mass (6 o'clock 4 CMFN). The biopsy marking clip is in expected position at the site of biopsy. IMPRESSION: Appropriate positioning of the coil shaped biopsy marking clip at the site of biopsy in the lower inner LEFT breast. Final Assessment: Post Procedure Mammograms for Marker Placement Electronically Signed   By: Aliene Lloyd M.D.   On: 03/02/2024 14:38   MM 3D DIAGNOSTIC MAMMOGRAM BILATERAL BREAST Result Date: 03/01/2024 CLINICAL DATA:  Patient presents with a palpable left breast lump with overlying skin retraction. EXAM: DIGITAL DIAGNOSTIC BILATERAL MAMMOGRAM WITH TOMOSYNTHESIS AND CAD; ULTRASOUND LEFT BREAST LIMITED TECHNIQUE: Bilateral digital  diagnostic mammography and breast tomosynthesis was performed. The images were evaluated with computer-aided detection. ; Targeted ultrasound examination of the left breast was performed. COMPARISON:  None available. ACR Breast Density Category b: There are scattered areas of fibroglandular density. FINDINGS: There is a spiculated mass with associated architectural distortion in the posteroinferior aspect of the left breast that corresponds to the palpable lump. There is overlying skin retraction. The posterior aspect of the mass is not included on the mammographic field of view. The mass measures at least 2.5 cm in size. There are no other breast masses and no other areas of architectural distortion. There are no areas of significant asymmetry and no suspicious calcifications. On physical exam, there is a firm fixed mass in the inferior aspect the left breast just above the inframammary fold. Overlying skin is flattened. Targeted ultrasound is performed, showing a hypoechoic irregular mass with associated architectural distortion in the 6 o'clock position of the left breast, 4 cm the nipple. Mass measures 3.6 x 1.8 x 2.5 cm. Posterior aspect the mass approaches and possibly involves the chest wall. Sonographic imaging of the left axilla demonstrates normal lymph nodes. No enlarged or abnormal lymph nodes. IMPRESSION: 1. Highly suspicious 3.6 cm mass in the inferior aspect of the left breast. RECOMMENDATION: 1. Ultrasound-guided core needle biopsy of the left breast mass. This has been scheduled for tomorrow. I have discussed the findings and recommendations with the patient. If applicable, a reminder letter will be sent to the patient regarding the next appointment. BI-RADS CATEGORY  5: Highly suggestive of malignancy. Electronically Signed   By: Alm Parkins M.D.   On: 03/01/2024 15:55   US  LIMITED ULTRASOUND INCLUDING AXILLA LEFT BREAST  Result Date: 03/01/2024 CLINICAL DATA:  Patient presents with a palpable  left breast lump with overlying skin retraction. EXAM: DIGITAL DIAGNOSTIC BILATERAL MAMMOGRAM WITH TOMOSYNTHESIS AND CAD; ULTRASOUND LEFT BREAST LIMITED TECHNIQUE: Bilateral  digital diagnostic mammography and breast tomosynthesis was performed. The images were evaluated with computer-aided detection. ; Targeted ultrasound examination of the left breast was performed. COMPARISON:  None available. ACR Breast Density Category b: There are scattered areas of fibroglandular density. FINDINGS: There is a spiculated mass with associated architectural distortion in the posteroinferior aspect of the left breast that corresponds to the palpable lump. There is overlying skin retraction. The posterior aspect of the mass is not included on the mammographic field of view. The mass measures at least 2.5 cm in size. There are no other breast masses and no other areas of architectural distortion. There are no areas of significant asymmetry and no suspicious calcifications. On physical exam, there is a firm fixed mass in the inferior aspect the left breast just above the inframammary fold. Overlying skin is flattened. Targeted ultrasound is performed, showing a hypoechoic irregular mass with associated architectural distortion in the 6 o'clock position of the left breast, 4 cm the nipple. Mass measures 3.6 x 1.8 x 2.5 cm. Posterior aspect the mass approaches and possibly involves the chest wall. Sonographic imaging of the left axilla demonstrates normal lymph nodes. No enlarged or abnormal lymph nodes. IMPRESSION: 1. Highly suspicious 3.6 cm mass in the inferior aspect of the left breast. RECOMMENDATION: 1. Ultrasound-guided core needle biopsy of the left breast mass. This has been scheduled for tomorrow. I have discussed the findings and recommendations with the patient. If applicable, a reminder letter will be sent to the patient regarding the next appointment. BI-RADS CATEGORY  5: Highly suggestive of malignancy. Electronically  Signed   By: Alm Parkins M.D.   On: 03/01/2024 15:55       IMPRESSION/PLAN: 1. Stage IB, cT2N0M0, grade 2 ER/PR positive invasive ductal carcinoma with associated intermediate grade DCIS of the left breast. Dr. Dewey discusses the pathology findings and reviews the nature of localized breast disease. The consensus from the breast conference includes Oncotype Dx on her biopsy specimen to determine if she would be a candidate for neoadjuvant treatment as well as preneoadjuvant MRI. Proposed surgery could include breast conservation with lumpectomy with sentinel node biopsy. Dr. Dewey also recommends external radiotherapy to the breast  to reduce risks of local recurrence. Dr. Lanny anticipates adjuvant antiestrogen therapy to follow. We discussed the risks, benefits, short, and long term effects of radiotherapy, as well as the curative intent, and the patient is interested in proceeding. Dr. Dewey discusses the delivery and logistics of radiotherapy and anticipates a course of 4 or up to 6 1/2 weeks of radiotherapy to the left breast with deep inspiration breath hold technique. We will see her back a few weeks after surgery to discuss the simulation process and anticipate we starting radiotherapy about 4-6 weeks after surgery.  2. Possible genetic predisposition to malignancy. The patient is a candidate for genetic testing given her  personal history. She will meet with our geneticist today in clinic.   In a visit lasting 60 minutes, greater than 50% of the time was spent face to face reviewing her case, as well as in preparation of, discussing, and coordinating the patient's care.  The above documentation reflects my direct findings during this shared patient visit. Please see the separate note by Dr. Dewey on this date for the remainder of the patient's plan of care.    Donald KYM Husband, [facility]    **Disclaimer: This note was dictated with voice recognition software. Similar sounding words can  inadvertently be transcribed and this note may  contain transcription errors which may not have been corrected upon publication of note.**
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
## 2024-03-16 NOTE — Progress Notes (Signed)
 [facility] Multidisciplinary Clinic Spiritual Care Note  Met with Tiffany Reese and her daughter Tiffany Reese in Breast Multidisciplinary Clinic to introduce Support Center team/resources.  she completed SDOH screening; results follow below.  SDOH Interventions    Flowsheet Row Clinical Support from 01/27/2024 in [facility] [place] Clinical Support from 01/23/2023 in [facility] Coordination from 01/05/2023 in [facility] Chronic Care Management from 06/23/2022 in [facility] at [place] Office Visit from 06/17/2022 in [facility] [place] Clinical Support from 01/21/2022 in [facility] [place]  SDOH Interventions        Food Insecurity Interventions Intervention Not Indicated Intervention Not Indicated Intervention Not Indicated -- -- Intervention Not Indicated  Housing Interventions Intervention Not Indicated Intervention Not Indicated Intervention Not Indicated -- -- Intervention Not Indicated  Transportation Interventions Intervention Not Indicated Intervention Not Indicated -- -- -- Intervention Not Indicated  Utilities Interventions Intervention Not Indicated Intervention Not Indicated -- -- -- --  Alcohol Usage Interventions Intervention Not Indicated (Score <7) -- -- -- -- --  Depression Interventions/Treatment  -- -- -- -- Currently on Treatment --  Financial Strain Interventions Intervention Not Indicated Intervention Not Indicated -- Intervention Not Indicated -- Intervention Not Indicated  Physical Activity Interventions Intervention Not Indicated Intervention Not Indicated -- -- -- Intervention Not Indicated  Stress Interventions Patient Declined Other (Comment)  [on medication] -- -- -- Other (Comment)  [Followed by Dr Arfeen]  Social Connections Interventions Intervention Not Indicated -- -- -- -- Intervention Not Indicated  Health Literacy Interventions  Intervention Not Indicated -- -- -- -- --    SDOH Screenings   Food Insecurity: No Food Insecurity (03/16/2024)  Housing: Low Risk  (03/16/2024)  Transportation Needs: No Transportation Needs (03/16/2024)  Utilities: Not At Risk (03/16/2024)  Alcohol Screen: Low Risk  (01/27/2024)  Depression (PHQ2-9): Low Risk  (03/16/2024)  Financial Resource Strain: Low Risk  (01/27/2024)  Physical Activity: Sufficiently Active (01/27/2024)  Social Connections: Socially Isolated (01/27/2024)  Stress: Stress Concern Present (01/27/2024)  Tobacco Use: Low Risk  (03/16/2024)  Health Literacy: Adequate Health Literacy (01/27/2024)    Chaplain and patient discussed common feelings and emotions when being diagnosed with cancer, and the importance of support during treatment.  Chaplain informed patient of the support team and support services at [facility].  Chaplain provided contact information and encouraged patient to call with any questions or concerns.  Tiffany Reese notes that she indicated more than half the days on the two depression items on the SDOH screening particularly because of her cancer diagnosis, though she also notes that she has the support of a psychiatrist for baseline mental health needs (anxiety, depression, PTSD, etc), and deals with chronic pain and fibromyalgia as well. Further, she reports high social distress because a friend who has significant health problems has been staying with her recently.  Follow up needed: Yes.  Tiffany Reese welcomes a follow-up call in ca two weeks.   [address] Tiffany Reese, [place], [facility] Pager [phone] Voicemail [phone]

## 2024-03-16 NOTE — Progress Notes (Signed)
 Oncotype ordered and sent fax request to [facility]

## 2024-03-17 ENCOUNTER — Other Ambulatory Visit

## 2024-03-18 ENCOUNTER — Ambulatory Visit
Admission: RE | Admit: 2024-03-18 | Discharge: 2024-03-18 | Disposition: A | Discharge status: Home / Self Care | Source: Ambulatory Visit | Attending: Hematology | Admitting: Hematology

## 2024-03-18 ENCOUNTER — Telehealth: Payer: Self-pay | Admitting: Hematology

## 2024-03-18 DIAGNOSIS — C50512 Malignant neoplasm of lower-outer quadrant of left female breast: Secondary | ICD-10-CM

## 2024-03-18 DIAGNOSIS — C50812 Malignant neoplasm of overlapping sites of left female breast: Secondary | ICD-10-CM | POA: Diagnosis not present

## 2024-03-18 MED ORDER — GADOPICLENOL 0.5 MMOL/ML IV SOLN
10.0000 mL | Freq: Once | INTRAVENOUS | Status: AC | PRN
Start: 2024-03-18 — End: 2024-03-18
  Administered 2024-03-18: 10 mL via INTRAVENOUS

## 2024-03-18 NOTE — Telephone Encounter (Signed)
 Scheduled appointments per 6/25 los. Talked with the patient and she is aware of the made appointments.

## 2024-03-20 ENCOUNTER — Ambulatory Visit: Payer: Self-pay | Admitting: Hematology

## 2024-03-21 ENCOUNTER — Telehealth: Payer: Self-pay | Admitting: *Deleted

## 2024-03-21 ENCOUNTER — Encounter: Payer: Self-pay | Admitting: *Deleted

## 2024-03-21 ENCOUNTER — Telehealth (HOSPITAL_COMMUNITY): Payer: Self-pay

## 2024-03-21 NOTE — Telephone Encounter (Signed)
 This patient called last week, she was having increased anxiety due to a recent cancer diagnosis and she was going in for a scan and to meet with Oncology on Friday. Dr. Akhtar gave a suggestion, but patient really just wanted an increase in her anxiety medication. Please see previous phone encounters. She called back this morning and wants you to know that she is feeling much better since meeting with Oncology and she does not need any medication adjustments at this time.

## 2024-03-21 NOTE — Telephone Encounter (Signed)
 Spoke with patient to follow up from [facility] 6/25 and assess navigation needs. Let her know that the MRI showed only known disease and it looks to invade the muscle.  Patient stated she started her aromasin  with no complaints. No further questions at this time. Encouraged her to call should anything arise. Patient verbalized understanding.

## 2024-03-22 ENCOUNTER — Encounter: Payer: Self-pay | Admitting: *Deleted

## 2024-03-22 ENCOUNTER — Other Ambulatory Visit: Payer: Self-pay | Admitting: *Deleted

## 2024-03-22 ENCOUNTER — Telehealth: Payer: Self-pay | Admitting: *Deleted

## 2024-03-22 DIAGNOSIS — Z170 Estrogen receptor positive status [ER+]: Secondary | ICD-10-CM

## 2024-03-22 DIAGNOSIS — C50212 Malignant neoplasm of upper-inner quadrant of left female breast: Secondary | ICD-10-CM | POA: Diagnosis not present

## 2024-03-22 NOTE — Telephone Encounter (Signed)
 Received oncotype results of 15/4%. Patient aware. Msg sent to team.

## 2024-03-23 ENCOUNTER — Other Ambulatory Visit (HOSPITAL_COMMUNITY): Payer: Self-pay | Admitting: Psychiatry

## 2024-03-23 DIAGNOSIS — F319 Bipolar disorder, unspecified: Secondary | ICD-10-CM

## 2024-03-24 ENCOUNTER — Telehealth: Payer: Self-pay | Admitting: Genetic Counselor

## 2024-03-24 NOTE — Telephone Encounter (Signed)
 Revealed negative genetic testing on the BRCAPlus panel.  The remainder of testing is pending.

## 2024-03-27 ENCOUNTER — Other Ambulatory Visit (HOSPITAL_COMMUNITY): Payer: Self-pay | Admitting: Psychiatry

## 2024-03-27 DIAGNOSIS — F319 Bipolar disorder, unspecified: Secondary | ICD-10-CM

## 2024-03-28 ENCOUNTER — Ambulatory Visit: Payer: Self-pay | Admitting: Genetic Counselor

## 2024-03-28 ENCOUNTER — Other Ambulatory Visit (HOSPITAL_COMMUNITY): Payer: Self-pay | Admitting: Psychiatry

## 2024-03-28 ENCOUNTER — Telehealth: Payer: Self-pay | Admitting: Genetic Counselor

## 2024-03-28 ENCOUNTER — Encounter: Payer: Self-pay | Admitting: Genetic Counselor

## 2024-03-28 DIAGNOSIS — Z1379 Encounter for other screening for genetic and chromosomal anomalies: Secondary | ICD-10-CM | POA: Insufficient documentation

## 2024-03-28 DIAGNOSIS — F4310 Post-traumatic stress disorder, unspecified: Secondary | ICD-10-CM

## 2024-03-28 DIAGNOSIS — F419 Anxiety disorder, unspecified: Secondary | ICD-10-CM

## 2024-03-28 NOTE — Telephone Encounter (Signed)
Revealed negative genetic testing.  Discussed that we do not know why she has breast cancer or why there is cancer in the family. It could be due to a different gene that we are not testing, or maybe our current technology may not be able to pick something up.  It will be important for her to keep in contact with genetics to keep up with whether additional testing may be needed. 

## 2024-03-28 NOTE — Progress Notes (Signed)
 HPI:  [name] was previously seen in the [facility] due to a personal and family history of cancer and concerns regarding a hereditary predisposition to cancer. Please refer to our prior cancer genetics clinic note for more information regarding our discussion, assessment and recommendations, at the time. [name] recent genetic test results were disclosed to her, as were recommendations warranted by these results. These results and recommendations are discussed in more detail below.  CANCER HISTORY:  Oncology History  Malignant neoplasm of lower-outer quadrant of left breast of female, estrogen receptor positive (HCC)  03/02/2024 Cancer Staging   Staging form: Breast, AJCC 8th Edition - Clinical stage from 03/02/2024: Stage IB (cT2, cN0, cM0, G2, ER+, PR+, HER2-) - Signed by Lanny Callander, MD on 03/16/2024 Histologic grading system: 3 grade system   03/07/2024 Initial Diagnosis   Malignant neoplasm of lower-outer quadrant of left breast of female, estrogen receptor positive (HCC)   03/28/2024 Genetic Testing   Negative genetic testing on the CancerNext-Expanded+RNainsight panel.  The report date is March 28, 2024.  The CancerNext-Expanded gene panel offered by [facility] and includes sequencing, rearrangement, and RNA analysis for the following 77 genes: AIP, ALK, APC, ATM, BAP1, BARD1, BMPR1A, BRCA1, BRCA2, BRIP1, CDC73, CDH1, CDK4, CDKN1B, CDKN2A, CEBPA, CHEK2, CTNNA1, DDX41, DICER1, ETV6, FH, FLCN, GATA2, LZTR1, MAX, MBD4, MEN1, MET, MLH1, MSH2, MSH3, MSH6, MUTYH, NF1, NF2, NTHL1, PALB2, PHOX2B, PMS2, POT1, PRKAR1A, PTCH1, PTEN, RAD51C, RAD51D, RB1, RET, RPS20, RUNX1, SDHA, SDHAF2, SDHB, SDHC, SDHD, SMAD4, SMARCA4, SMARCB1, SMARCE1, STK11, SUFU, TMEM127, TP53, TSC1, TSC2, VHL, and WT1 (sequencing and deletion/duplication); AXIN2, CTNNA1, DDX41, EGFR, HOXB13, KIT, MBD4, MITF, MSH3, PDGFRA, POLD1 and POLE (sequencing only); EPCAM and GREM1 (deletion/duplication only).  RNA data is routinely analyzed for use in variant interpretation for all genes.      FAMILY HISTORY:  We obtained a detailed, 4-generation family history.  Significant diagnoses are listed below: Family History  Problem Relation Age of Onset   COPD Mother 49   Alcohol abuse Mother    CAD Father    Colon cancer Father 42   Ankylosing spondylitis Sister    Drug abuse Sister    Alcohol abuse Sister    Prostate cancer Maternal Uncle    Other Maternal Grandmother        lived to be over 100   Lung cancer Maternal Grandfather    Aneurysm Paternal Grandmother        brain       The patient has a daughter who had cervical cancer.  She has a brother and sister who were cancer free.  Her parents are deceased.   The patient's father had colon cancer.  He had five sisters that are cancer free.  There is no other cancer on this side of the family.   The patients mother is deceased.  She has a brother with prostate cancer and a father who had lung cancer.   [name] is unaware of previous family history of genetic testing for hereditary cancer risks. There is no reported Ashkenazi Jewish ancestry. There is no known consanguinity.  GENETIC TEST RESULTS: Genetic testing reported out on March 28, 2024 through the CancerNext-Expanded+RNAinsight cancer panel found no pathogenic mutations. The CancerNext-Expanded gene panel offered by [facility] and includes sequencing, rearrangement, and RNA analysis for the following 77 genes: AIP, ALK, APC, ATM, BAP1, BARD1, BMPR1A, BRCA1, BRCA2, BRIP1, CDC73, CDH1, CDK4, CDKN1B, CDKN2A, CEBPA, CHEK2, CTNNA1, DDX41, DICER1, ETV6, FH, FLCN, GATA2, LZTR1, MAX,  MBD4, MEN1, MET, MLH1, MSH2, MSH3, MSH6, MUTYH, NF1, NF2, NTHL1, PALB2, PHOX2B, PMS2, POT1, PRKAR1A, PTCH1, PTEN, RAD51C, RAD51D, RB1, RET, RPS20, RUNX1, SDHA, SDHAF2, SDHB, SDHC, SDHD, SMAD4, SMARCA4, SMARCB1, SMARCE1, STK11, SUFU, TMEM127, TP53, TSC1, TSC2, VHL, and WT1 (sequencing and  deletion/duplication); AXIN2, CTNNA1, DDX41, EGFR, HOXB13, KIT, MBD4, MITF, MSH3, PDGFRA, POLD1 and POLE (sequencing only); EPCAM and GREM1 (deletion/duplication only). RNA data is routinely analyzed for use in variant interpretation for all genes. The test report has been scanned into EPIC and is located under the Molecular Pathology section of the Results Review tab.  A portion of the result report is included below for reference.     We discussed with [name] that because current genetic testing is not perfect, it is possible there may be a gene mutation in one of these genes that current testing cannot detect, but that chance is small.  We also discussed, that there could be another gene that has not yet been discovered, or that we have not yet tested, that is responsible for the cancer diagnoses in the family. It is also possible there is a hereditary cause for the cancer in the family that [name] did not inherit and therefore was not identified in her testing.  Therefore, it is important to remain in touch with cancer genetics in the future so that we can continue to offer [name] the most up to date genetic testing.   ADDITIONAL GENETIC TESTING: We discussed with [name] that her genetic testing was fairly extensive.  If there are genes identified to increase cancer risk that can be analyzed in the future, we would be happy to discuss and coordinate this testing at that time.    CANCER SCREENING RECOMMENDATIONS: [name] test result is considered negative (normal).  This means that we have not identified a hereditary cause for her personal and family history of cancer at this time. Most cancers happen by chance and this negative test suggests that her personal and family history of cancer may fall into this category.    Possible reasons for [name]'s negative genetic test include:  1. There may be a gene mutation in one of these genes that current testing  methods cannot detect but that chance is small.  2. There could be another gene that has not yet been discovered, or that we have not yet tested, that is responsible for the cancer diagnoses in the family.  3.  There may be no hereditary risk for cancer in the family. The cancers in [name] and/or her family may be sporadic/familial or due to other genetic and environmental factors. 4. It is also possible there is a hereditary cause for the cancer in the family that [name] did not inherit.  Therefore, it is recommended she continue to follow the cancer management and screening guidelines provided by her oncology and primary healthcare provider. An individual's cancer risk and medical management are not determined by genetic test results alone. Overall cancer risk assessment incorporates additional factors, including personal medical history, family history, and any available genetic information that may result in a personalized plan for cancer prevention and surveillance  RECOMMENDATIONS FOR FAMILY MEMBERS:   Since she did not inherit a identifiable mutation in a cancer predisposition gene included on this panel, her children could not have inherited a known mutation from her in one of these genes. Individuals in this family might be at some increased risk of developing cancer, over the general population risk,  simply due to the family history of cancer.  We recommended women in this family have a yearly mammogram beginning at age 64, or 28 years younger than the earliest onset of cancer, an annual clinical breast exam, and perform monthly breast self-exams. Women in this family should also have a gynecological exam as recommended by their primary provider. All family members should be referred for colonoscopy starting at age 75, or 28 years younger than the earliest onset of cancer.  FOLLOW-UP: Lastly, we discussed with [name] that cancer genetics is a rapidly advancing field and it  is possible that new genetic tests will be appropriate for her and/or her family members in the future. We encouraged her to remain in contact with cancer genetics on an annual basis so we can update her personal and family histories and let her know of advances in cancer genetics that may benefit this family.   Our contact number was provided. [name] questions were answered to her satisfaction, and she knows she is welcome to call us  at anytime with additional questions or concerns.   Darice Monte, MS, [facility] Licensed, Certified Genetic Counselor [email or web address]

## 2024-03-29 ENCOUNTER — Encounter: Payer: Self-pay | Admitting: General Practice

## 2024-03-29 ENCOUNTER — Ambulatory Visit: Admitting: Physical Therapy

## 2024-03-29 ENCOUNTER — Other Ambulatory Visit (HOSPITAL_COMMUNITY): Payer: Self-pay | Admitting: Psychiatry

## 2024-03-29 DIAGNOSIS — F319 Bipolar disorder, unspecified: Secondary | ICD-10-CM

## 2024-03-29 NOTE — Progress Notes (Signed)
 [facility] Spiritual Care Note  Tiffany Reese reported feeling significant anxiety today, including uncertainty about what to expect of this afternoon's PT/lymphedema eval appointment. Together we developed the coping plan that her next step would be to phone the PT office to learn what to expect of the appointment, which helped her regain a sense of control.  Due to her number of commitments this week, we plan to follow up by phone next week for a Spiritual Care check-in. She also has chaplain's direct number in case needs arise in the meantime.   03/29/24 0900  Spiritual Encounters  Care provided to: Patient  Referral source  [facility] follow-up call)  Reason for visit Routine spiritual support  Spiritual Framework  Presenting Themes Coping tools  Patient Stress Factors Lack of knowledge;Loss of control  Interventions  Spiritual Care Interventions Made Compassionate presence;Reflective listening;Normalization of emotions    Elia Olam Filiberto Fae, [facility] Pager [phone] Voicemail [phone]

## 2024-03-29 NOTE — Therapy (Unsigned)
 OUTPATIENT PHYSICAL THERAPY BREAST CANCER BASELINE EVALUATION   Patient Name: Tiffany Reese MRN: [id number] DOB:1961/06/23, 63 y.o., female Today's Date: 03/30/2024  END OF SESSION:  PT End of Session - 03/30/24 1445     Visit Number 1    Number of Visits 2    Date for PT Re-Evaluation 06/22/24    Authorization Type none needed    PT Start Time 1400    PT Stop Time 1445    PT Time Calculation (min) 45 min    Activity Tolerance Patient tolerated treatment well    Behavior During Therapy WFL for tasks assessed/performed          Past Medical History:  Diagnosis Date   Anemia    Anxiety    Arthritis    Colitis 2019   Colon polyps    Depression, major    Diabetes mellitus type II, controlled (HCC)    Family history of colon cancer    Family history of prostate cancer    Fibromyalgia    Frequent headaches    used to have severe migraines   GERD (gastroesophageal reflux disease)    Hepatic steatosis    Hypertension    Hypothyroid    Obesity    PTSD (post-traumatic stress disorder)    Past Surgical History:  Procedure Laterality Date   ANKLE SURGERY Right    fracture repair   BREAST BIOPSY Left 03/02/2024   US  LT BREAST BX W LOC DEV 1ST LESION IMG BX SPEC US  GUIDE 03/02/2024 [facility] MAMMO[facility]   COLONOSCOPY  2015   KIDNEY SURGERY     stenting   ORIF ANKLE FRACTURE Left 11/05/2018   Procedure: OPEN REDUCTION INTERNAL FIXATION (ORIF) LEFT TRIMALLEOLAR ANKLE FRACTURE WITH OR WITHOUT FIXATION OF POSTERIOR LIP, XR 3V LEFT ANKLE;  Surgeon: Beverley Evalene BIRCH, MD;  Location: [facility];  Service: Orthopedics;  Laterality: Left;   VAGINAL HYSTERECTOMY  2015   ovaries remain, benign findings, was done for abnormal pap's   Patient Active Problem List   Diagnosis Date Noted   Genetic testing 03/28/2024   Family history of prostate cancer    Family history of colon cancer    Malignant neoplasm of lower-outer quadrant of left breast of female, estrogen  receptor positive (HCC) 03/07/2024   Fibromyalgia 08/14/2023   Elevated d-dimer 11/16/2018   At risk for adverse drug reaction 11/10/2018   GERD (gastroesophageal reflux disease)    Hypertension    Diabetes mellitus type II, controlled (HCC)    PTSD (post-traumatic stress disorder) 11/01/2018   Anxiety 11/01/2018   Depression, recurrent (HCC) 11/01/2018   Closed left ankle fracture 11/01/2018   Anemia 11/01/2018   Hypothyroid 11/01/2018   Fatty liver disease, nonalcoholic 09/01/2018   BMI 45.0-49.9, adult (HCC) 04/29/2018   DDD (degenerative disc disease), thoracolumbar 04/29/2018    PCP: Heron Sharper, MD  REFERRING PROVIDER: Ebbie Cough, MD  REFERRING DIAG: C50.512,Z17.0 (ICD-10-CM) - Malignant neoplasm of lower-outer quadrant of left breast of female, estrogen receptor positive (HCC)  THERAPY DIAG:  Malignant neoplasm of lower-outer quadrant of left breast of female, estrogen receptor positive (HCC)  Abnormal posture  Rationale for Evaluation and Treatment: Rehabilitation  ONSET DATE: 03/16/24  SUBJECTIVE:  SUBJECTIVE STATEMENT: Patient reports she is here today to be seen by her medical team for her newly diagnosed left breast cancer.   PERTINENT HISTORY:  Patient was diagnosed on 03/16/24 with left grade 2 IDC. It measures 3.6 x 2.5 x 1.8 cm and is located in the lower-outer quadrant. It does involve the mm of the chest wall.  It is ER+, PR+, HER2- with a Ki67 of 65%.  Will take antiestrogen until September and will have a surgery plan. Most likely no chemotherapy.  Other hx includes: chronic back pain, fibromyalgia, extreme reports of tingling in both arms Lt>Rt.    PATIENT GOALS:   reduce lymphedema risk and learn post op HEP.   PAIN:  Are you having pain? Yes: NPRS scale:  3/10 Pain location: elbows, knees, back Pain description: achy Aggravating factors: the new medication Relieving factors: ice  PRECAUTIONS: Active CA  RED FLAGS: None   HAND DOMINANCE: right  WEIGHT BEARING RESTRICTIONS: No  FALLS:  Has patient fallen in last 6 months? No I do have trouble with dizziness - maybe anxiety related or anxiety medication related   LIVING ENVIRONMENT: Patient lives with: alone, but may be living with my friend   OCCUPATION: on disability   LEISURE: nothing   PRIOR LEVEL OF FUNCTION: Independent   OBJECTIVE: Note: Objective measures were completed at Evaluation unless otherwise noted.  COGNITION: Overall cognitive status: Within functional limits for tasks assessed    POSTURE:  Forward head and rounded shoulders posture  UPPER EXTREMITY AROM/PROM:  A/PROM RIGHT   eval   Shoulder extension 45  Shoulder flexion 160  Shoulder abduction 170  Shoulder internal rotation 80  Shoulder external rotation 80    (Blank rows = not tested)  A/PROM LEFT   eval  Shoulder extension 45  Shoulder flexion 167 - pn  Shoulder abduction 170  Shoulder internal rotation 75  Shoulder external rotation 75    (Blank rows = not tested)  UPPER EXTREMITY STRENGTH: WNL  LYMPHEDEMA ASSESSMENTS (in cm):   LANDMARK RIGHT   eval  10 cm proximal to olecranon process 36  Olecranon process 31.3  10 cm proximal to ulnar styloid process 24  Just proximal to ulnar styloid process 17.8  Across hand at thumb web space 21.5  At base of 2nd digit 7.2  (Blank rows = not tested)  LANDMARK LEFT   eval  10 cm proximal to olecranon process 37  Olecranon process 31  10 cm proximal to ulnar styloid process 23.9  Just proximal to ulnar styloid process 17.7  Across hand at thumb web space 21  At base of 2nd digit 7.2  (Blank rows = not tested)  L-DEX LYMPHEDEMA SCREENING: The patient was assessed using the L-Dex machine today to produce a lymphedema index  baseline score. The patient will be reassessed on a regular basis (typically every 3 months) to obtain new L-Dex scores. If the score is > 6.5 points away from his/her baseline score indicating onset of subclinical lymphedema, it will be recommended to wear a compression garment for 4 weeks, 12 hours per day and then be reassessed. If the score continues to be > 6.5 points from baseline at reassessment, we will initiate lymphedema treatment. Assessing in this manner has a 95% rate of preventing clinically significant lymphedema.  QUICK DASH SURVEY: 27.27%  PATIENT EDUCATION:  Education details: Time spent educating patient on aspects of self-care to maximize post op recovery. Patient was educated on where and how to get a  post op compression bra to use to reduce post op edema. Patient was also educated on the use of SOZO screenings and surveillance principles for early identification of lymphedema onset. She was instructed to use the post op pillow in the axilla for pressure and pain relief. Patient educated on lymphedema risk reduction and post op shoulder/posture HEP. Person educated: Patient Education method: Explanation, Demonstration, Handout Education comprehension: Patient verbalized understanding and returned demonstration  HOME EXERCISE PROGRAM: Patient was instructed today in a home exercise program today for post op shoulder range of motion. These included active assist shoulder flexion in sitting, scapular retraction, wall walking with shoulder abduction, and hands behind head external rotation.  She was encouraged to do these twice a day, holding 3 seconds and repeating 5 times when permitted by her physician.   ASSESSMENT:  CLINICAL IMPRESSION: Pt will benefit from a post op PT reassessment to determine needs and from L-Dex screens every 3 months for 2 years to detect subclinical lymphedema. She is not yet scheduled for surgery so we will be added to our watch list and get scheduled  as able.  She is very anxious about her diagnosis and would benefit from continued support throughout recovery.   Pt will benefit from skilled therapeutic intervention to improve on the following deficits: Decreased knowledge of precautions, impaired UE functional use, pain, decreased ROM, postural dysfunction.   PT treatment/interventions: ADL/self-care home management, pt/family education, therapeutic exercise  REHAB POTENTIAL: Excellent  CLINICAL DECISION MAKING: Stable/uncomplicated  EVALUATION COMPLEXITY: Low   GOALS: Goals reviewed with patient? YES  LONG TERM GOALS: (STG=LTG)    Name Target Date Goal status  1 Pt will be able to verbalize understanding of pertinent lymphedema risk reduction practices relevant to her dx specifically related to skin care.  Baseline:  No knowledge 03/30/2024 Achieved at eval  2 Pt will be able to return demo and/or verbalize understanding of the post op HEP related to regaining shoulder ROM. Baseline:  No knowledge 03/30/2024 Achieved at eval  3 Pt will be able to verbalize understanding of the importance of viewing the post op After Breast CA Class video for further lymphedema risk reduction education and therapeutic exercise.  Baseline:  No knowledge 03/30/2024 Achieved at eval  4 Pt will demo she has regained full shoulder ROM and function post operatively compared to baselines.  Baseline: See objective measurements taken today. 06/22/24     PLAN:  PT FREQUENCY/DURATION: EVAL and 1 follow up appointment.   PLAN FOR NEXT SESSION: will reassess 3-4 weeks post op to determine needs.   Patient will follow up at outpatient cancer rehab 3-4 weeks following surgery.  If the patient requires physical therapy at that time, a specific plan will be dictated and sent to the referring physician for approval. The patient was educated today on appropriate basic range of motion exercises to begin post operatively and the importance of viewing the After Breast  Cancer class video following surgery.  Patient was educated today on lymphedema risk reduction practices as it pertains to recommendations that will benefit the patient immediately following surgery.  She verbalized good understanding.    Physical Therapy Information for After Breast Cancer Surgery/Treatment:  Lymphedema is a swelling condition that you may be at risk for in your arm if you have lymph nodes removed from the armpit area.  After a sentinel node biopsy, the risk is approximately 5-9% and is higher after an axillary node dissection.  There is treatment available for this condition and  it is not life-threatening.  Contact your physician or physical therapist with concerns. You may begin the 4 shoulder/posture exercises (see additional sheet) when permitted by your physician (typically a week after surgery).  If you have drains, you may need to wait until those are removed before beginning range of motion exercises.  A general recommendation is to not lift your arms above shoulder height until drains are removed.  These exercises should be done to your tolerance and gently.  This is not a no pain/no gain type of recovery so listen to your body and stretch into the range of motion that you can tolerate, stopping if you have pain.  If you are having immediate reconstruction, ask your plastic surgeon about doing exercises as he or she may want you to wait. We encourage you to view the After Breast Cancer class video following surgery.  You will learn information related to lymphedema risk, prevention and treatment and additional exercises to regain mobility following surgery.   While undergoing any medical procedure or treatment, try to avoid blood pressure being taken or needle sticks from occurring on the arm on the side of cancer.   This recommendation begins after surgery and continues for the rest of your life.  This may help reduce your risk of getting lymphedema (swelling in your arm). An  excellent resource for those seeking information on lymphedema is the National Lymphedema Network's web site. It can be accessed at www.lymphnet.org If you notice swelling in your hand, arm or breast at any time following surgery (even if it is many years from now), please contact your doctor or physical therapist to discuss this.  Lymphedema can be treated at any time but it is easier for you if it is treated early on.  If you feel like your shoulder motion is not returning to normal in a reasonable amount of time, please contact your surgeon or physical therapist.  [facility] [phone]. [address], [place] [address]  [facility] CLASS After Breast Cancer Class  After Breast Cancer Class is a specially designed exercise class video to assist you in a safe recover after having breast cancer surgery.  In this video you will learn how to get back to full function whether your drains were just removed or if you had surgery a month ago. The video can be viewed on this page: [email or web address].boyd-meyer.org/ or on YouTube here: https://[email or web address].  Class Goals  Understand specific stretches to improve the flexibility of you chest and shoulder. Learn ways to safely strengthen your upper body and improve your posture. Understand the warning signs of infection and why you may be at risk for an arm infection. Learn about Lymphedema and prevention.  ** You do not need to view this video until after surgery.  Drains should be removed to participate in the recommended exercises on the video.  Patient was instructed today in a home exercise program today for post op shoulder range of motion. These included active assist shoulder flexion in sitting, scapular retraction, wall walking with shoulder abduction, and hands behind head external rotation.  She was encouraged to do these twice a day, holding 3  seconds and repeating 5 times when permitted by her physician.    Larue Saddie SAUNDERS, PT 03/30/2024, 2:46 PM

## 2024-03-29 NOTE — Therapy (Incomplete)
 OUTPATIENT PHYSICAL THERAPY BREAST CANCER BASELINE EVALUATION   Patient Name: Tiffany Reese MRN: [id number] DOB:[birth year], 63 y.o., female Today's Date: 03/29/2024  END OF SESSION:   Past Medical History:  Diagnosis Date   Anemia    Anxiety    Arthritis    Colitis 2019   Colon polyps    Depression, major    Diabetes mellitus type II, controlled (HCC)    Family history of colon cancer    Family history of prostate cancer    Fibromyalgia    Frequent headaches    used to have severe migraines   GERD (gastroesophageal reflux disease)    Hepatic steatosis    Hypertension    Hypothyroid    Obesity    PTSD (post-traumatic stress disorder)    Past Surgical History:  Procedure Laterality Date   ANKLE SURGERY Right    fracture repair   BREAST BIOPSY Left 03/02/2024   US  LT BREAST BX W LOC DEV 1ST LESION IMG BX SPEC US  GUIDE 03/02/2024 [facility]   COLONOSCOPY  2015   KIDNEY SURGERY     stenting   ORIF ANKLE FRACTURE Left 11/05/2018   Procedure: OPEN REDUCTION INTERNAL FIXATION (ORIF) LEFT TRIMALLEOLAR ANKLE FRACTURE WITH OR WITHOUT FIXATION OF POSTERIOR LIP, XR 3V LEFT ANKLE;  Surgeon: Beverley Evalene BIRCH, MD;  Location: [facility];  Service: Orthopedics;  Laterality: Left;   VAGINAL HYSTERECTOMY  2015   ovaries remain, benign findings, was done for abnormal pap's   Patient Active Problem List   Diagnosis Date Noted   Genetic testing 03/28/2024   Family history of prostate cancer    Family history of colon cancer    Malignant neoplasm of lower-outer quadrant of left breast of female, estrogen receptor positive (HCC) 03/07/2024   Fibromyalgia 08/14/2023   Elevated d-dimer 11/16/2018   At risk for adverse drug reaction 11/10/2018   GERD (gastroesophageal reflux disease)    Hypertension    Diabetes mellitus type II, controlled (HCC)    PTSD (post-traumatic stress disorder) 11/01/2018   Anxiety 11/01/2018   Depression, recurrent (HCC) 11/01/2018    Closed left ankle fracture 11/01/2018   Anemia 11/01/2018   Hypothyroid 11/01/2018   Fatty liver disease, nonalcoholic 09/01/2018   BMI 45.0-49.9, adult (HCC) 04/29/2018   DDD (degenerative disc disease), thoracolumbar 04/29/2018    PCP: ***  REFERRING PROVIDER: Ebbie Cough, MD  REFERRING DIAG: C50.512,Z17.0 (ICD-10-CM) - Malignant neoplasm of lower-outer quadrant of left breast of female, estrogen receptor positive (HCC)  THERAPY DIAG:  No diagnosis found.  Rationale for Evaluation and Treatment: Rehabilitation  ONSET DATE: 03/16/24  SUBJECTIVE:  SUBJECTIVE STATEMENT: Patient reports she is here today to be seen by her medical team for her newly diagnosed left breast cancer.   PERTINENT HISTORY:  Patient was diagnosed on 03/16/24 with left grade 2. It measures 3.6 x 2.5 x 1.8 cm and is located in the lower-outer quadrant. It is ER+, PR+, HER2- with a Ki67 of 65%.   PATIENT GOALS:   reduce lymphedema risk and learn post op HEP.   PAIN:  Are you having pain? {OPRCPAIN:27236}  PRECAUTIONS: Active CA {Therapy precautions:24002}  RED FLAGS: {PT Red Flags:29287}   HAND DOMINANCE: {RIGHT/LEFT:21944}  WEIGHT BEARING RESTRICTIONS: {Yes ***/No:24003}  FALLS:  Has patient fallen in last 6 months? {fallsyesno:27318}  LIVING ENVIRONMENT: Patient lives with: *** Lives in: {Lives in:25570} Has following equipment at home: {Assistive devices:23999}  OCCUPATION: ***  LEISURE: ***  PRIOR LEVEL OF FUNCTION: {PLOF:24004}   OBJECTIVE: Note: Objective measures were completed at Evaluation unless otherwise noted.  COGNITION: Overall cognitive status: {cognition:24006}    POSTURE:  Forward head and rounded shoulders posture  UPPER EXTREMITY AROM/PROM:  A/PROM RIGHT   eval    Shoulder extension   Shoulder flexion   Shoulder abduction   Shoulder internal rotation   Shoulder external rotation     (Blank rows = not tested)  A/PROM LEFT   eval  Shoulder extension   Shoulder flexion   Shoulder abduction   Shoulder internal rotation   Shoulder external rotation     (Blank rows = not tested)  CERVICAL AROM: All within normal limits:    Percent limited  Flexion   Extension   Right lateral flexion   Left lateral flexion   Right rotation   Left rotation     UPPER EXTREMITY STRENGTH: ***  LYMPHEDEMA ASSESSMENTS (in cm):   LANDMARK RIGHT   eval  10 cm proximal to olecranon process   Olecranon process   10 cm proximal to ulnar styloid process   Just proximal to ulnar styloid process   Across hand at thumb web space   At base of 2nd digit   (Blank rows = not tested)  LANDMARK LEFT   eval  10 cm proximal to olecranon process   Olecranon process   10 cm proximal to ulnar styloid process   Just proximal to ulnar styloid process   Across hand at thumb web space   At base of 2nd digit   (Blank rows = not tested)  L-DEX LYMPHEDEMA SCREENING:  The patient was assessed using the L-Dex machine today to produce a lymphedema index baseline score. The patient will be reassessed on a regular basis (typically every 3 months) to obtain new L-Dex scores. If the score is > 6.5 points away from his/her baseline score indicating onset of subclinical lymphedema, it will be recommended to wear a compression garment for 4 weeks, 12 hours per day and then be reassessed. If the score continues to be > 6.5 points from baseline at reassessment, we will initiate lymphedema treatment. Assessing in this manner has a 95% rate of preventing clinically significant lymphedema.    QUICK DASH SURVEY: ***  PATIENT EDUCATION:  Education details: Time spent educating patient on aspects of self-care to maximize post op recovery. Patient was educated on where and how to get a  post op compression bra to use to reduce post op edema. Patient was also educated on the use of SOZO screenings and surveillance principles for early identification of lymphedema onset. She was instructed to use the post op pillow  in the axilla for pressure and pain relief. Patient educated on lymphedema risk reduction and post op shoulder/posture HEP. Person educated: Patient Education method: Explanation, Demonstration, Handout Education comprehension: Patient verbalized understanding and returned demonstration  HOME EXERCISE PROGRAM: Patient was instructed today in a home exercise program today for post op shoulder range of motion. These included active assist shoulder flexion in sitting, scapular retraction, wall walking with shoulder abduction, and hands behind head external rotation.  She was encouraged to do these twice a day, holding 3 seconds and repeating 5 times when permitted by her physician.   ASSESSMENT:  CLINICAL IMPRESSION: ***Her multidisciplinary medical team met prior to her assessments to determine a recommended treatment plan. She is planning to have ***. She will benefit from a post op PT reassessment to determine needs and from L-Dex screens every 3 months for 2 years to detect subclinical lymphedema.  Pt will benefit from skilled therapeutic intervention to improve on the following deficits: Decreased knowledge of precautions, impaired UE functional use, pain, decreased ROM, postural dysfunction.   PT treatment/interventions: ADL/self-care home management, pt/family education, therapeutic exercise  REHAB POTENTIAL: {rehabpotential:25112}  CLINICAL DECISION MAKING: {clinical decision making:25114}  EVALUATION COMPLEXITY: {Evaluation complexity:25115}   GOALS: Goals reviewed with patient? YES  LONG TERM GOALS: (STG=LTG)    Name Target Date Goal status  1 Pt will be able to verbalize understanding of pertinent lymphedema risk reduction practices relevant to her dx  specifically related to skin care.  Baseline:  No knowledge 03/29/2024 Achieved at eval  2 Pt will be able to return demo and/or verbalize understanding of the post op HEP related to regaining shoulder ROM. Baseline:  No knowledge 03/29/2024 Achieved at eval  3 Pt will be able to verbalize understanding of the importance of viewing the post op After Breast CA Class video for further lymphedema risk reduction education and therapeutic exercise.  Baseline:  No knowledge 03/29/2024 Achieved at eval  4 Pt will demo she has regained full shoulder ROM and function post operatively compared to baselines.  Baseline: See objective measurements taken today. ***     PLAN:  PT FREQUENCY/DURATION: EVAL and 1 follow up appointment.   PLAN FOR NEXT SESSION: will reassess 3-4 weeks post op to determine needs.   Patient will follow up at outpatient cancer rehab 3-4 weeks following surgery.  If the patient requires physical therapy at that time, a specific plan will be dictated and sent to the referring physician for approval. The patient was educated today on appropriate basic range of motion exercises to begin post operatively and the importance of viewing the After Breast Cancer class video following surgery.  Patient was educated today on lymphedema risk reduction practices as it pertains to recommendations that will benefit the patient immediately following surgery.  She verbalized good understanding.    Physical Therapy Information for After Breast Cancer Surgery/Treatment:  Lymphedema is a swelling condition that you may be at risk for in your arm if you have lymph nodes removed from the armpit area.  After a sentinel node biopsy, the risk is approximately 5-9% and is higher after an axillary node dissection.  There is treatment available for this condition and it is not life-threatening.  Contact your physician or physical therapist with concerns. You may begin the 4 shoulder/posture exercises (see additional  sheet) when permitted by your physician (typically a week after surgery).  If you have drains, you may need to wait until those are removed before beginning range of motion exercises.  A general recommendation is to not lift your arms above shoulder height until drains are removed.  These exercises should be done to your tolerance and gently.  This is not a no pain/no gain type of recovery so listen to your body and stretch into the range of motion that you can tolerate, stopping if you have pain.  If you are having immediate reconstruction, ask your plastic surgeon about doing exercises as he or she may want you to wait. We encourage you to view the After Breast Cancer class video following surgery.  You will learn information related to lymphedema risk, prevention and treatment and additional exercises to regain mobility following surgery.   While undergoing any medical procedure or treatment, try to avoid blood pressure being taken or needle sticks from occurring on the arm on the side of cancer.   This recommendation begins after surgery and continues for the rest of your life.  This may help reduce your risk of getting lymphedema (swelling in your arm). An excellent resource for those seeking information on lymphedema is the National Lymphedema Network's web site. It can be accessed at www.lymphnet.org If you notice swelling in your hand, arm or breast at any time following surgery (even if it is many years from now), please contact your doctor or physical therapist to discuss this.  Lymphedema can be treated at any time but it is easier for you if it is treated early on.  If you feel like your shoulder motion is not returning to normal in a reasonable amount of time, please contact your surgeon or physical therapist.  [facility] [phone]. [address], [address], [place] [address]  [facility] After Breast Cancer Class  After Breast Cancer Class is a  specially designed exercise class video to assist you in a safe recover after having breast cancer surgery.  In this video you will learn how to get back to full function whether your drains were just removed or if you had surgery a month ago. The video can be viewed on this page: [email or web address].boyd-meyer.org/ or on YouTube here: https://youtu.[email or web address].  Class Goals  Understand specific stretches to improve the flexibility of you chest and shoulder. Learn ways to safely strengthen your upper body and improve your posture. Understand the warning signs of infection and why you may be at risk for an arm infection. Learn about Lymphedema and prevention.  ** You do not need to view this video until after surgery.  Drains should be removed to participate in the recommended exercises on the video.  Patient was instructed today in a home exercise program today for post op shoulder range of motion. These included active assist shoulder flexion in sitting, scapular retraction, wall walking with shoulder abduction, and hands behind head external rotation.  She was encouraged to do these twice a day, holding 3 seconds and repeating 5 times when permitted by her physician.    [employer], PT 03/29/2024, 9:30 AM

## 2024-03-30 ENCOUNTER — Ambulatory Visit: Attending: General Surgery | Admitting: Rehabilitation

## 2024-03-30 ENCOUNTER — Encounter: Payer: Self-pay | Admitting: Rehabilitation

## 2024-03-30 DIAGNOSIS — Z170 Estrogen receptor positive status [ER+]: Secondary | ICD-10-CM | POA: Insufficient documentation

## 2024-03-30 DIAGNOSIS — R293 Abnormal posture: Secondary | ICD-10-CM | POA: Diagnosis not present

## 2024-03-30 DIAGNOSIS — C50512 Malignant neoplasm of lower-outer quadrant of left female breast: Secondary | ICD-10-CM | POA: Diagnosis not present

## 2024-03-31 ENCOUNTER — Other Ambulatory Visit: Payer: Self-pay | Admitting: Family Medicine

## 2024-03-31 DIAGNOSIS — E039 Hypothyroidism, unspecified: Secondary | ICD-10-CM

## 2024-04-04 ENCOUNTER — Encounter: Payer: Self-pay | Admitting: General Practice

## 2024-04-04 NOTE — Progress Notes (Signed)
 [facility] Spiritual Care Note  Followed up by phone, reaching Ms Tomasello at an inconvenient time. She reports that she is doing ok (less anxious than when we last connected) and plans to phone Spiritual Care at a better time for follow-up support.  [address] [place], [place], [facility] Pager [phone] Voicemail [phone]

## 2024-04-13 ENCOUNTER — Encounter: Payer: Self-pay | Admitting: Hematology

## 2024-04-26 ENCOUNTER — Other Ambulatory Visit (HOSPITAL_COMMUNITY): Payer: Self-pay | Admitting: Psychiatry

## 2024-04-26 ENCOUNTER — Other Ambulatory Visit: Payer: Self-pay | Admitting: Family Medicine

## 2024-04-26 DIAGNOSIS — G8929 Other chronic pain: Secondary | ICD-10-CM

## 2024-04-26 DIAGNOSIS — F319 Bipolar disorder, unspecified: Secondary | ICD-10-CM

## 2024-04-27 ENCOUNTER — Other Ambulatory Visit (HOSPITAL_COMMUNITY): Payer: Self-pay

## 2024-04-27 MED ORDER — LURASIDONE HCL 40 MG PO TABS: 40.0000 mg | ORAL_TABLET | Freq: Every day | ORAL | 1 refills | Status: DC

## 2024-04-28 ENCOUNTER — Other Ambulatory Visit: Payer: Self-pay | Admitting: Family Medicine

## 2024-04-28 DIAGNOSIS — M25572 Pain in left ankle and joints of left foot: Secondary | ICD-10-CM

## 2024-05-01 ENCOUNTER — Other Ambulatory Visit: Payer: Self-pay | Admitting: Family Medicine

## 2024-05-01 DIAGNOSIS — J209 Acute bronchitis, unspecified: Secondary | ICD-10-CM

## 2024-05-03 ENCOUNTER — Encounter: Payer: Self-pay | Admitting: *Deleted

## 2024-05-26 ENCOUNTER — Other Ambulatory Visit: Payer: Self-pay | Admitting: Family Medicine

## 2024-05-26 DIAGNOSIS — E118 Type 2 diabetes mellitus with unspecified complications: Secondary | ICD-10-CM

## 2024-05-30 ENCOUNTER — Telehealth (HOSPITAL_COMMUNITY): Admitting: Psychiatry

## 2024-05-31 ENCOUNTER — Telehealth: Payer: Self-pay | Admitting: *Deleted

## 2024-05-31 ENCOUNTER — Ambulatory Visit: Payer: Self-pay

## 2024-05-31 NOTE — Telephone Encounter (Signed)
 Received call from patient stating she was doing ok on her exemestane  but lately she has been having terrible pain and she feels the medication has triggered her fibromyalgia.  She is scheduled for repeat mammo/us  and to see Dr. Lanny 9/22.  Informed her to stop the medication until she sees Dr. Lanny on 9/22 to see if the pain gets better. Will let Dr. Lanny know.  Patient verbalized understanding.

## 2024-05-31 NOTE — Telephone Encounter (Signed)
 FYI Only or Action Required?: Action required by provider: clinical question for provider.  Patient was last seen in primary care on 02/11/2024 by Ozell Heron HERO, MD.  Called Nurse Triage reporting Medication Question.  Symptoms are: gradually worsening.  Triage Disposition: Discuss With PCP and Callback by Nurse Today (overriding Call PCP Now) - Send message to PCP office  Patient/caregiver understands and will follow disposition?: Yes Reason for Disposition  [1] Caller has URGENT medicine question about med that primary care doctor (or NP/PA) or specialist prescribed AND [2] triager unable to answer question  Answer Assessment - Initial Assessment Questions 1. NAME of MEDICINE: What medicine(s) are you calling about?     Lyrica   2. QUESTION: What is your question? (e.g., double dose of medicine, side effect)     Would like to increase dosage of lyrica . Is on exemestane  25 mg for breast cancer and states oncologist told her that it can cause her to have increased pain in areas with arthritis, fibromyalgia.   3. PRESCRIBER: Who prescribed the medicine? Reason: if prescribed by specialist, call should be referred to that group.     PCP  4. SYMPTOMS: Do you have any symptoms? If Yes, ask: What symptoms are you having?  How bad are the symptoms (e.g., mild, moderate, severe)     Increased pain  Protocols used: Medication Question Call-A-AH Copied from CRM #[id number]. Topic: Clinical - Medication Question >> May 31, 2024 11:09 AM Mesmerise C wrote: Reason for CRM: Patient inquiring if her medication pregabalin  (LYRICA ) 50 MG capsule bu upped a dosage for the pain due to exemestane  (AROMASIN ) 25 MG tablet causing her to be in more pain would like a call back cannot access MyChart

## 2024-06-01 NOTE — Telephone Encounter (Signed)
 She received 270 tablets on 7/2, she can use the 50 mg tablets and increase to 2 tablets twice a day, when she runs out of her medication have her let me know and I can call in a new script for the increased dose.

## 2024-06-01 NOTE — Telephone Encounter (Signed)
 Patient informed of the message below.

## 2024-06-02 ENCOUNTER — Encounter (HOSPITAL_COMMUNITY): Payer: Self-pay | Admitting: Psychiatry

## 2024-06-02 ENCOUNTER — Telehealth (HOSPITAL_BASED_OUTPATIENT_CLINIC_OR_DEPARTMENT_OTHER): Admitting: Psychiatry

## 2024-06-02 VITALS — Wt 240.0 lb

## 2024-06-02 DIAGNOSIS — F319 Bipolar disorder, unspecified: Secondary | ICD-10-CM | POA: Diagnosis not present

## 2024-06-02 DIAGNOSIS — F4321 Adjustment disorder with depressed mood: Secondary | ICD-10-CM

## 2024-06-02 DIAGNOSIS — F4310 Post-traumatic stress disorder, unspecified: Secondary | ICD-10-CM

## 2024-06-02 DIAGNOSIS — F419 Anxiety disorder, unspecified: Secondary | ICD-10-CM | POA: Diagnosis not present

## 2024-06-02 MED ORDER — DIAZEPAM 2 MG PO TABS: ORAL_TABLET | ORAL | 2 refills | Status: DC

## 2024-06-02 MED ORDER — AMITRIPTYLINE HCL 75 MG PO TABS
75.0000 mg | ORAL_TABLET | Freq: Every day | ORAL | 2 refills | Status: AC
Start: 2024-06-02 — End: ?

## 2024-06-02 MED ORDER — LURASIDONE HCL 40 MG PO TABS: 40.0000 mg | ORAL_TABLET | Freq: Every day | ORAL | 2 refills | Status: DC
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
## 2024-06-06 ENCOUNTER — Ambulatory Visit
Admission: RE | Admit: 2024-06-06 | Discharge: 2024-06-06 | Disposition: A | Discharge status: Home / Self Care | Source: Ambulatory Visit | Attending: Hematology | Admitting: Hematology

## 2024-06-06 DIAGNOSIS — Z170 Estrogen receptor positive status [ER+]: Secondary | ICD-10-CM

## 2024-06-06 DIAGNOSIS — R928 Other abnormal and inconclusive findings on diagnostic imaging of breast: Secondary | ICD-10-CM | POA: Diagnosis not present

## 2024-06-06 DIAGNOSIS — N6325 Unspecified lump in the left breast, overlapping quadrants: Secondary | ICD-10-CM | POA: Diagnosis not present

## 2024-06-07 ENCOUNTER — Encounter: Payer: Self-pay | Admitting: *Deleted

## 2024-06-09 ENCOUNTER — Other Ambulatory Visit: Payer: Self-pay | Admitting: Hematology

## 2024-06-10 NOTE — Telephone Encounter (Unsigned)
 Copied from CRM [id number]. Topic: Clinical - Medication Refill >> Jun 10, 2024 11:07 AM Harlene ORN wrote: Medication: pregabalin  (LYRICA ) 50 MG capsule  Has the patient contacted their pharmacy? Yes (Agent: If no, request that the patient contact the pharmacy for the refill. If patient does not wish to contact the pharmacy document the reason why and proceed with request.) (Agent: If yes, when and what did the pharmacy advise?)  This is the patient's preferred pharmacy:  [id number] [address] [place] [facility] - [address] [place] [address] Phone: [phone] Fax: [phone]  Is this the correct pharmacy for this prescription? Yes If no, delete pharmacy and type the correct one.   Has the prescription been filled recently? No  Is the patient out of the medication? Yes  Has the patient been seen for an appointment in the last year OR does the patient have an upcoming appointment? Yes  Can we respond through MyChart? Yes  Agent: Please be advised that Rx refills may take up to 3 business days. We ask that you follow-up with your pharmacy.

## 2024-06-13 ENCOUNTER — Other Ambulatory Visit: Payer: Self-pay | Admitting: Family Medicine

## 2024-06-13 ENCOUNTER — Telehealth: Payer: Self-pay | Admitting: *Deleted

## 2024-06-13 ENCOUNTER — Other Ambulatory Visit: Payer: Self-pay

## 2024-06-13 ENCOUNTER — Inpatient Hospital Stay: Attending: Hematology

## 2024-06-13 ENCOUNTER — Inpatient Hospital Stay (HOSPITAL_BASED_OUTPATIENT_CLINIC_OR_DEPARTMENT_OTHER): Admitting: Hematology

## 2024-06-13 ENCOUNTER — Telehealth: Payer: Self-pay | Admitting: Family Medicine

## 2024-06-13 VITALS — BP 122/70 | HR 76 | Temp 98.0°F | Resp 18 | Ht 66.5 in | Wt 242.4 lb

## 2024-06-13 DIAGNOSIS — Z860100 Personal history of colon polyps, unspecified: Secondary | ICD-10-CM | POA: Insufficient documentation

## 2024-06-13 DIAGNOSIS — Z882 Allergy status to sulfonamides status: Secondary | ICD-10-CM | POA: Insufficient documentation

## 2024-06-13 DIAGNOSIS — E119 Type 2 diabetes mellitus without complications: Secondary | ICD-10-CM | POA: Insufficient documentation

## 2024-06-13 DIAGNOSIS — Z90710 Acquired absence of both cervix and uterus: Secondary | ICD-10-CM | POA: Diagnosis not present

## 2024-06-13 DIAGNOSIS — Z1732 Human epidermal growth factor receptor 2 negative status: Secondary | ICD-10-CM | POA: Insufficient documentation

## 2024-06-13 DIAGNOSIS — M797 Fibromyalgia: Secondary | ICD-10-CM | POA: Insufficient documentation

## 2024-06-13 DIAGNOSIS — Z79899 Other long term (current) drug therapy: Secondary | ICD-10-CM | POA: Insufficient documentation

## 2024-06-13 DIAGNOSIS — Z79811 Long term (current) use of aromatase inhibitors: Secondary | ICD-10-CM | POA: Diagnosis not present

## 2024-06-13 DIAGNOSIS — R42 Dizziness and giddiness: Secondary | ICD-10-CM | POA: Diagnosis not present

## 2024-06-13 DIAGNOSIS — Z888 Allergy status to other drugs, medicaments and biological substances status: Secondary | ICD-10-CM | POA: Insufficient documentation

## 2024-06-13 DIAGNOSIS — Z170 Estrogen receptor positive status [ER+]: Secondary | ICD-10-CM

## 2024-06-13 DIAGNOSIS — Z8042 Family history of malignant neoplasm of prostate: Secondary | ICD-10-CM | POA: Insufficient documentation

## 2024-06-13 DIAGNOSIS — F411 Generalized anxiety disorder: Secondary | ICD-10-CM | POA: Insufficient documentation

## 2024-06-13 DIAGNOSIS — E039 Hypothyroidism, unspecified: Secondary | ICD-10-CM | POA: Diagnosis not present

## 2024-06-13 DIAGNOSIS — Z800 Family history of malignant neoplasm of digestive organs: Secondary | ICD-10-CM | POA: Diagnosis not present

## 2024-06-13 DIAGNOSIS — C50512 Malignant neoplasm of lower-outer quadrant of left female breast: Secondary | ICD-10-CM | POA: Insufficient documentation

## 2024-06-13 LAB — CBC WITH DIFFERENTIAL (CANCER CENTER ONLY)
Abs Immature Granulocytes: 0.02 K/uL (ref 0.00–0.07)
Basophils Absolute: 0.1 K/uL (ref 0.0–0.1)
Basophils Relative: 1 %
Eosinophils Absolute: 0.1 K/uL (ref 0.0–0.5)
Eosinophils Relative: 1 %
HCT: 44.6 % (ref 36.0–46.0)
Hemoglobin: 15.2 g/dL — ABNORMAL HIGH (ref 12.0–15.0)
Immature Granulocytes: 0 %
Lymphocytes Relative: 38 %
Lymphs Abs: 2.9 K/uL (ref 0.7–4.0)
MCH: 30.8 pg (ref 26.0–34.0)
MCHC: 34.1 g/dL (ref 30.0–36.0)
MCV: 90.3 fL (ref 80.0–100.0)
Monocytes Absolute: 0.5 K/uL (ref 0.1–1.0)
Monocytes Relative: 7 %
Neutro Abs: 4 K/uL (ref 1.7–7.7)
Neutrophils Relative %: 53 %
Platelet Count: 222 K/uL (ref 150–400)
RBC: 4.94 MIL/uL (ref 3.87–5.11)
RDW: 13.4 % (ref 11.5–15.5)
WBC Count: 7.5 K/uL (ref 4.0–10.5)
nRBC: 0 % (ref 0.0–0.2)

## 2024-06-13 LAB — CMP (CANCER CENTER ONLY)
ALT: 60 U/L — ABNORMAL HIGH (ref 0–44)
AST: 43 U/L — ABNORMAL HIGH (ref 15–41)
Albumin: 4.7 g/dL (ref 3.5–5.0)
Alkaline Phosphatase: 113 U/L (ref 38–126)
Anion gap: 7 (ref 5–15)
BUN: 13 mg/dL (ref 8–23)
CO2: 29 mmol/L (ref 22–32)
Calcium: 9.8 mg/dL (ref 8.9–10.3)
Chloride: 103 mmol/L (ref 98–111)
Creatinine: 1.05 mg/dL — ABNORMAL HIGH (ref 0.44–1.00)
GFR, Estimated: >60 mL/min (ref >60)
Glucose, Bld: 95 mg/dL (ref 70–99)
Potassium: 4.3 mmol/L (ref 3.5–5.1)
Sodium: 139 mmol/L (ref 135–145)
Total Bilirubin: 0.4 mg/dL (ref 0.0–1.2)
Total Protein: 7.7 g/dL (ref 6.5–8.1)

## 2024-06-13 MED ORDER — PREGABALIN 100 MG PO CAPS: 100.0000 mg | ORAL_CAPSULE | Freq: Two times a day (BID) | ORAL | 0 refills | Status: DC

## 2024-06-13 NOTE — Telephone Encounter (Signed)
 Script sent for the 100 mg capsules twice a day

## 2024-06-13 NOTE — Telephone Encounter (Signed)
 Copied from CRM [id number]. Topic: Clinical - Medication Refill >> Jun 13, 2024  9:22 AM Charlet HERO wrote: Medication: pregabalin  (LYRICA ) 50 MG capsule  Has the patient contacted their pharmacy? No She called in to make a request for the lyrica   This is the patient's preferred pharmacy:  [id number] [address] [place] [place] - [address] [place] [address] Phone: [phone] Fax: [phone]  Is this the correct pharmacy for this prescription? Yes If no, delete pharmacy and type the correct one.   Has the prescription been filled recently? Yes  Is the patient out of the medication? Yes  Has the patient been seen for an appointment in the last year OR does the patient have an upcoming appointment? Yes  Can we respond through MyChart? No  Agent: Please be advised that Rx refills may take up to 3 business days. We ask that you follow-up with your pharmacy.

## 2024-06-13 NOTE — Telephone Encounter (Signed)
 Copied from CRM [id number]. Topic: Clinical - Prescription Issue >> Jun 13, 2024  9:55 AM Berwyn MATSU wrote: Reason for CRM:  Patient called in requesting a new prescription for the lyrica  due to change in dosage that was made on.   Tiffany Reese HERO, MD to [name] LABOR, CMA 06/01/24  9:09 AM Note She received 270 tablets on 7/2, she can use the 50 mg tablets and increase to 2 tablets twice a day, when she runs out of her medication have her let me know and I can call in a new script for the increased dose.    Patient needs a new prescription showing the dosage change.   May you please assist.

## 2024-06-13 NOTE — Telephone Encounter (Signed)
Left a detailed message at the patient's cell number with the information below.   

## 2024-06-13 NOTE — Telephone Encounter (Signed)
 Patient spoke with [facility] as her refill of pregabalin  (LYRICA ) 50 MG capsule has not been placed and patient states she will be out Monday.  [id number] [place], [place] - [address] Phone: [phone]  Fax: [phone]

## 2024-06-13 NOTE — Telephone Encounter (Signed)
 Copied from CRM [id number]. Topic: Clinical - Prescription Issue >> Jun 13, 2024  3:05 PM Mesmerise C wrote: Reason for CRM:  [facility]  [facility] and won't have it in for 2-3 days and is panicing because she's completely out she's requesting Dr. Ozell send 50mg  instead so she doesn't go with out it does not want it called at a different pharmacy, please call patient back once it is done she has a land line

## 2024-06-13 NOTE — Assessment & Plan Note (Addendum)
 cT2(vs 4)N0M0, stage IB, ER+/PR+/HER2-, G2  -Presented with a palpable right breast mass for 2 years -Diagnosed on April 01, 2024. - breast MRI showed 3.3 cm biopsy-proven malignancy over the posterior central mid to lower left breast. This mass abuts and causes anterior tethering of the adjacent pectoralis with evidence of focal adjacent pectoralis muscle invasion - She started neoadjuvant exemestane  in March 17, 2024.

## 2024-06-13 NOTE — Telephone Encounter (Signed)
 [facility] 67-month supply in on 5/22.  Toribio stated it is too soon to fill as patient picked up the last Rx in July and will be able to fill on the 29th.  Patient states she is out as PCP increased the dose.  Message sent to PCP.

## 2024-06-13 NOTE — Progress Notes (Signed)
 [facility]   Telephone:([phone]) [phone] [phone]   Clinic Follow up Note   Patient Care Team: Ozell Heron HERO, MD as PCP - General (Family Medicine) Lionell Jon DEL, [facility] (Pharmacist) Tyree Nanetta SAILOR, RN as Oncology Nurse Navigator Ebbie Cough, MD as Consulting Physician (General Surgery) Lanny Callander, MD as Consulting Physician (Hematology) Dewey Rush, MD as Consulting Physician (Radiation Oncology)  Date of Service:  06/13/2024  CHIEF COMPLAINT: f/u of breast cancer   CURRENT THERAPY:  Neoadjuvant exemestane  is on hold since June 01, 2024 due to side effects  Oncology History   Malignant neoplasm of lower-outer quadrant of left breast of female, estrogen receptor positive (HCC)  cT2(vs 4)N0M0, stage IB, ER+/PR+/HER2-, G2  -Presented with a palpable right breast mass for 2 years -Diagnosed on April 01, 2024. - breast MRI showed 3.3 cm biopsy-proven malignancy over the posterior central mid to lower left breast. This mass abuts and causes anterior tethering of the adjacent pectoralis with evidence of focal adjacent pectoralis muscle invasion - She started neoadjuvant exemestane  in March 17, 2024.  Assessment & Plan Locally advanced left breast cancer with chest wall involvement, partial response to endocrine therapy, intolerant to exemestane  Partial response to exemestane  with tumor size reduction from 2.5 x 1.6 x 3.6 cm to 1.9 x 1.8 x 3 cm, indicating a 20-25% reduction. Intolerant to exemestane  due to severe side effects, including exacerbation of fibromyalgia and flu-like symptoms. Pain in the chest wall has worsened, possibly due to tumor shrinkage. - Order breast MRI to assess tumor invasion into the muscle - Consult with Dr. Ebbie regarding surgical options - Hold exemestane  - Consider lower dose of exemestane  with Ibrance if surgery is not feasible - Discuss chemotherapy as a potential option if other treatments are not  viable  Fibromyalgia exacerbated by endocrine therapy Exemestane  exacerbated fibromyalgia, leading to severe symptoms and flu-like feelings, significantly impacting quality of life.  Generalized anxiety disorder with inadequate response to current therapy Generalized anxiety disorder inadequately managed with diazepam  2 mg three times a day. Anxiety worsened due to breast cancer diagnosis and treatment. Increasing lurasidone  to 60 mg resulted in dizziness, reverted to 40 mg. Need to explore alternative anxiety management strategies, potentially including increased diazepam , although current psychiatrist is hesitant to prescribe more than 2 mg three times a day for patients over 60. - Message Dr. Arfeen to discuss potential adjustments to anxiety medication, including the possibility of increasing diazepam  dosage  Plan - I reviewed her recent ultrasound and mammogram, which showed good partial response. - Will order a breast MRI with and without contrast to be done in next few weeks, to see if her breast cancer is resectable now. - I will message her surgeon Dr. Ebbie, and her psychiatrist Dr. Curry  - Phone visit after breast MRI, to determine next step of her breast cancer treatment.   SUMMARY OF ONCOLOGIC HISTORY: Oncology History  Malignant neoplasm of lower-outer quadrant of left breast of female, estrogen receptor positive (HCC)  03/02/2024 Cancer Staging   Staging form: Breast, AJCC 8th Edition - Clinical stage from 03/02/2024: Stage IB (cT2, cN0, cM0, G2, ER+, PR+, HER2-) - Signed by Lanny Callander, MD on 03/16/2024 Histologic grading system: 3 grade system   03/07/2024 Initial Diagnosis   Malignant neoplasm of lower-outer quadrant of left breast of female, estrogen receptor positive (HCC)   03/28/2024 Genetic Testing   Negative genetic testing on the CancerNext-Expanded+RNainsight panel.  The report date is March 28, 2024.  The CancerNext-Expanded  gene panel offered by [facility]  and includes sequencing, rearrangement, and RNA analysis for the following 77 genes: AIP, ALK, APC, ATM, BAP1, BARD1, BMPR1A, BRCA1, BRCA2, BRIP1, CDC73, CDH1, CDK4, CDKN1B, CDKN2A, CEBPA, CHEK2, CTNNA1, DDX41, DICER1, ETV6, FH, FLCN, GATA2, LZTR1, MAX, MBD4, MEN1, MET, MLH1, MSH2, MSH3, MSH6, MUTYH, NF1, NF2, NTHL1, PALB2, PHOX2B, PMS2, POT1, PRKAR1A, PTCH1, PTEN, RAD51C, RAD51D, RB1, RET, RPS20, RUNX1, SDHA, SDHAF2, SDHB, SDHC, SDHD, SMAD4, SMARCA4, SMARCB1, SMARCE1, STK11, SUFU, TMEM127, TP53, TSC1, TSC2, VHL, and WT1 (sequencing and deletion/duplication); AXIN2, CTNNA1, DDX41, EGFR, HOXB13, KIT, MBD4, MITF, MSH3, PDGFRA, POLD1 and POLE (sequencing only); EPCAM and GREM1 (deletion/duplication only). RNA data is routinely analyzed for use in variant interpretation for all genes.       Discussed the use of AI scribe software for clinical note transcription with the patient, who gave verbal consent to proceed.  History of Present Illness Tiffany Reese is a 63 year old female with breast cancer who presents for follow-up.  She experiences significant side effects from exemestane , including exacerbation of fibromyalgia with severe weakness, nausea, and flu-like symptoms. She discontinued exemestane  on June 01, 2024, after consulting with her nurse navigator.  Exemestane  has worsened her joint pain, particularly in areas affected by arthritis and previous fractures. She also experiences increased hot flashes, sweating through her nightgown, and requires nighttime showers due to being soaked.  She reports persistent chest wall and breast pain, which she attributes to the tumor shrinking. She has been able to palpate the mass and notes it seems to have been 'irritated' by the treatment. Previous imaging showed a reduction in tumor size from 2.5 x 1.6 x 3.6 cm to 1.9 x 1.8 x 3 cm.     All other systems were reviewed with the patient and are negative.  MEDICAL HISTORY:  Past Medical History:   Diagnosis Date   Anemia    Anxiety    Arthritis    Colitis 2019   Colon polyps    Depression, major    Diabetes mellitus type II, controlled (HCC)    Family history of colon cancer    Family history of prostate cancer    Fibromyalgia    Frequent headaches    used to have severe migraines   GERD (gastroesophageal reflux disease)    Hepatic steatosis    Hypertension    Hypothyroid    Obesity    PTSD (post-traumatic stress disorder)     SURGICAL HISTORY: Past Surgical History:  Procedure Laterality Date   ANKLE SURGERY Right    fracture repair   BREAST BIOPSY Left 03/02/2024   US  LT BREAST BX W LOC DEV 1ST LESION IMG BX SPEC US  GUIDE 03/02/2024 [facility] MAMMOGR[facility]   COLONOSCOPY  2015   KIDNEY SURGERY     stenting   ORIF ANKLE FRACTURE Left 11/05/2018   Procedure: OPEN REDUCTION INTERNAL FIXATION (ORIF) LEFT TRIMALLEOLAR ANKLE FRACTURE WITH OR WITHOUT FIXATION OF POSTERIOR LIP, XR 3V LEFT ANKLE;  Surgeon: Beverley Evalene BIRCH, MD;  Location: [facility];  Service: Orthopedics;  Laterality: Left;   VAGINAL HYSTERECTOMY  2015   ovaries remain, benign findings, was done for abnormal pap's    I have reviewed the social history and family history with the patient and they are unchanged from previous note.  ALLERGIES:  is allergic to bupropion, duloxetine, sulfa antibiotics, metformin and related, paxil [paroxetine hcl], prozac [fluoxetine hcl], trintellix [vortioxetine], and zoloft [sertraline hcl].  MEDICATIONS:  Current Outpatient Medications  Medication Sig Dispense  Refill   ACCU-CHEK AVIVA PLUS test strip USE AS DIRECTED UP TO 4 TIMES A DAY 100 strip 1   albuterol  (VENTOLIN  HFA) 108 (90 Base) MCG/ACT inhaler TAKE 2 PUFFS BY MOUTH EVERY 6 HOURS AS NEEDED FOR WHEEZE OR SHORTNESS OF BREATH 8.5 each 2   amitriptyline  (ELAVIL ) 75 MG tablet Take 1 tablet (75 mg total) by mouth at bedtime. 30 tablet 2   Blood Pressure Monitoring (BLOOD PRESSURE MONITOR/L CUFF) MISC 1  Device by Does not apply route daily. 1 each 0   Calcium  Citrate-Vitamin D  (CALCIUM  CITRATE + D PO) Take 630 mg by mouth. 650 mg of Calcium  and 500 units of vitamin D  (Patient not taking: Reported on 02/25/2024)     cetirizine  (ZYRTEC ) 10 MG tablet TAKE 1 TABLET BY MOUTH EVERY DAY 90 tablet 1   diazepam  (VALIUM ) 2 MG tablet Take two to three tab daily as needed for anxiety 90 tablet 2   diclofenac  (VOLTAREN ) 75 MG EC tablet TAKE 1 TABLET TWICE A DAY AS NEEDED FOR MODERATE PAIN 180 tablet 1   diclofenac  Sodium (VOLTAREN ) 1 % GEL Apply topically as needed. (Patient not taking: Reported on 02/25/2024)     exemestane  (AROMASIN ) 25 MG tablet TAKE 1 TABLET (25 MG TOTAL) BY MOUTH DAILY AFTER BREAKFAST. 30 tablet 2   furosemide  (LASIX ) 20 MG tablet TAKE 1 TABLET BY MOUTH EVERY DAY 90 tablet 1   levothyroxine  (SYNTHROID ) 50 MCG tablet TAKE 1 TABLET BY MOUTH EVERY DAY 90 tablet 1   lurasidone  (LATUDA ) 40 MG TABS tablet Take 1 tablet (40 mg total) by mouth daily with breakfast. 30 tablet 2   methocarbamol  (ROBAXIN ) 750 MG tablet TAKE 1 TABLET (750 MG TOTAL) BY MOUTH EVERY 8 (EIGHT) HOURS AS NEEDED FOR MUSCLE SPASMS. 90 tablet 1   Multiple Vitamins-Minerals (MULTIVITAMIN ADULT PO) Take 1 tablet by mouth daily.     Omega-3 Fatty Acids (FISH OIL PO) Take by mouth daily. (Patient not taking: Reported on 02/25/2024)     omeprazole  (PRILOSEC) 40 MG capsule Take 1 capsule (40 mg total) by mouth daily. 90 capsule 1   pregabalin  (LYRICA ) 100 MG capsule Take 1 capsule (100 mg total) by mouth 2 (two) times daily. 180 capsule 0   Semaglutide ,0.25 or 0.5MG /DOS, (OZEMPIC , 0.25 OR 0.5 MG/DOSE,) 2 MG/3ML SOPN INJECT 0.5 MG INTO THE SKIN ONE TIME PER WEEK 9 mL 0   Semaglutide ,0.25 or 0.5MG /DOS, 2 MG/1.5ML SOPN Inject 0.5 mg into the skin once a week. 1.5 mL 5   vitamin E 180 MG (400 UNITS) capsule Take 400 Units by mouth daily. (Patient not taking: Reported on 02/25/2024)     No current facility-administered medications for this  visit.    PHYSICAL EXAMINATION: ECOG PERFORMANCE STATUS: 1 - Symptomatic but completely ambulatory  Vitals:   06/13/24 1359  BP: 122/70  Pulse: 76  Resp: 18  Temp: 98 F (36.7 C)  SpO2: 100%   Wt Readings from Last 3 Encounters:  06/13/24 242 lb 6.4 oz (110 kg)  03/16/24 248 lb 9.6 oz (112.8 kg)  02/11/24 253 lb (114.8 kg)     GENERAL:alert, no distress and comfortable SKIN: skin color, texture, turgor are normal, no rashes or significant lesions EYES: normal, Conjunctiva are pink and non-injected, sclera clear Musculoskeletal:no cyanosis of digits and no clubbing  NEURO: alert & oriented x 3 with fluent speech, no focal motor/sensory deficits  Physical Exam   LABORATORY DATA:  I have reviewed the data as listed    Latest  Ref Rng & Units 06/13/2024    1:37 PM 03/16/2024   11:39 AM 11/19/2021    2:41 PM  CBC  WBC 4.0 - 10.5 K/uL 7.5  6.4  7.9   Hemoglobin 12.0 - 15.0 g/dL 84.7  85.7  84.6   Hematocrit 36.0 - 46.0 % 44.6  41.5  45.3   Platelets 150 - 400 K/uL 222  208  252.0         Latest Ref Rng & Units 06/13/2024    1:37 PM 03/16/2024   11:39 AM 08/14/2023   11:33 AM  CMP  Glucose 70 - 99 mg/dL 95  872  96   BUN 8 - 23 mg/dL 13  12  17    Creatinine 0.44 - 1.00 mg/dL 8.94  9.07  8.81   Sodium 135 - 145 mmol/L 139  138  138   Potassium 3.5 - 5.1 mmol/L 4.3  4.1  3.7   Chloride 98 - 111 mmol/L 103  105  99   CO2 22 - 32 mmol/L 29  25  30    Calcium  8.9 - 10.3 mg/dL 9.8  9.4  89.7   Total Protein 6.5 - 8.1 g/dL 7.7  7.3  7.9   Total Bilirubin 0.0 - 1.2 mg/dL 0.4  0.4  0.4   Alkaline Phos 38 - 126 U/L 113  112  152   AST 15 - 41 U/L 43  39  41   ALT 0 - 44 U/L 60  54  62       RADIOGRAPHIC STUDIES: I have personally reviewed the radiological images as listed and agreed with the findings in the report. No results found.    Orders Placed This Encounter  Procedures   MR BREAST BILATERAL W WO CONTRAST INC CAD    Standing Status:   Future    Expected  Date:   06/20/2024    Expiration Date:   06/13/2025    If indicated for the ordered procedure, I authorize the administration of contrast media per Radiology protocol:   Yes    What is the patient's sedation requirement?:   No Sedation    Does the patient have a pacemaker or implanted devices?:   No    Radiology Contrast Protocol - do NOT remove file path:   [email or web address]    Preferred imaging location?:   [facility] (table limit-550lbs)   All questions were answered. The patient knows to call the clinic with any problems, questions or concerns. No barriers to learning was detected. The total time spent in the appointment was 40 minutes, including review of chart and various tests results, discussions about plan of care and coordination of care plan     Onita Mattock, MD 06/13/2024

## 2024-06-14 MED ORDER — PREGABALIN 50 MG PO CAPS: 100.0000 mg | ORAL_CAPSULE | Freq: Two times a day (BID) | ORAL | 0 refills | Status: DC

## 2024-06-14 NOTE — Telephone Encounter (Signed)
 Spoke with Ripley at [facility] to confirm the Rx was received.  He stated he gave the patient 10 TABLETS to hold her over as he told her the shipment would be in today and he personally filled this for her last night.  Ripley stated the patient will be contacted when the Rx is ready for pick up.

## 2024-06-14 NOTE — Telephone Encounter (Signed)
 Script sent for the 50 mg capsules.

## 2024-06-15 ENCOUNTER — Other Ambulatory Visit: Payer: Self-pay

## 2024-06-16 ENCOUNTER — Encounter: Payer: Self-pay | Admitting: *Deleted

## 2024-06-17 ENCOUNTER — Ambulatory Visit
Admission: RE | Admit: 2024-06-17 | Discharge: 2024-06-17 | Disposition: A | Discharge status: Home / Self Care | Source: Ambulatory Visit | Attending: Hematology | Admitting: Hematology

## 2024-06-17 DIAGNOSIS — R928 Other abnormal and inconclusive findings on diagnostic imaging of breast: Secondary | ICD-10-CM | POA: Diagnosis not present

## 2024-06-17 DIAGNOSIS — Z170 Estrogen receptor positive status [ER+]: Secondary | ICD-10-CM

## 2024-06-17 MED ORDER — GADOPICLENOL 0.5 MMOL/ML IV SOLN
10.0000 mL | Freq: Once | INTRAVENOUS | Status: AC | PRN
  Administered 2024-06-17: 10 mL via INTRAVENOUS

## 2024-06-20 ENCOUNTER — Encounter: Payer: Self-pay | Admitting: *Deleted

## 2024-06-21 ENCOUNTER — Other Ambulatory Visit: Payer: Self-pay | Admitting: Family Medicine

## 2024-06-21 DIAGNOSIS — G8929 Other chronic pain: Secondary | ICD-10-CM

## 2024-06-23 ENCOUNTER — Other Ambulatory Visit: Payer: Self-pay | Admitting: General Surgery

## 2024-06-23 DIAGNOSIS — C50512 Malignant neoplasm of lower-outer quadrant of left female breast: Secondary | ICD-10-CM

## 2024-06-23 DIAGNOSIS — Z170 Estrogen receptor positive status [ER+]: Secondary | ICD-10-CM | POA: Diagnosis not present

## 2024-06-24 ENCOUNTER — Encounter: Payer: Self-pay | Admitting: *Deleted

## 2024-06-24 ENCOUNTER — Other Ambulatory Visit: Payer: Self-pay | Admitting: General Surgery

## 2024-06-24 DIAGNOSIS — Z170 Estrogen receptor positive status [ER+]: Secondary | ICD-10-CM

## 2024-06-25 ENCOUNTER — Other Ambulatory Visit: Payer: Self-pay | Admitting: Family Medicine

## 2024-06-26 ENCOUNTER — Other Ambulatory Visit: Payer: Self-pay | Admitting: Family Medicine

## 2024-06-28 ENCOUNTER — Encounter: Payer: Self-pay | Admitting: *Deleted

## 2024-06-28 DIAGNOSIS — Z170 Estrogen receptor positive status [ER+]: Secondary | ICD-10-CM

## 2024-06-28 DIAGNOSIS — M5416 Radiculopathy, lumbar region: Secondary | ICD-10-CM | POA: Diagnosis not present

## 2024-06-29 ENCOUNTER — Telehealth: Payer: Self-pay | Admitting: Hematology

## 2024-06-29 NOTE — Telephone Encounter (Signed)
 I lvm informing Tiffany Reese of her post op follow up scheduled for 11/5. I asked that she return my call to reschedule if needed.

## 2024-07-06 ENCOUNTER — Other Ambulatory Visit: Payer: Self-pay

## 2024-07-06 ENCOUNTER — Other Ambulatory Visit: Payer: Self-pay | Admitting: Hematology

## 2024-07-06 ENCOUNTER — Telehealth: Payer: Self-pay

## 2024-07-06 MED ORDER — ACETAMINOPHEN-CODEINE 300-30 MG PO TABS: 1.0000 | ORAL_TABLET | Freq: Four times a day (QID) | ORAL | 0 refills | Status: DC | PRN

## 2024-07-06 NOTE — Telephone Encounter (Signed)
 Attempted to contact the patient via telephone call.  Unable to reach the patient @T [phone]. LVM stating Dr. Lanny sent Tylenol  #3 to her pharmacy.

## 2024-07-06 NOTE — Progress Notes (Addendum)
 Tiffany Reese                                          MRN: [id number]   07/06/2024   The [facility] Quality Team Specialist reviewed this patient medical record for the purposes of chart review for care gap closure. The following were reviewed: chart review for care gap closure-diabetic eye exam and kidney health evaluation for diabetes:eGFR  and uACR.  09/21/2024- abstracted KED labs. No eed found  09/26/2024- Abstracted GSD for 2025    [facility] Quality Team

## 2024-07-06 NOTE — Telephone Encounter (Signed)
 Received telephone call from the patient c/o of L-Breast pain, grade 8-9 constant - pinching/stabbing pain on-going all day.  Patient stated her surgery is scheduled, 07/14/24 and is requesting pain medication [facility]. Let patient know that I would forward her message request to Dr. Lanny and her team. Patient voiced understanding.

## 2024-07-07 ENCOUNTER — Other Ambulatory Visit: Payer: Self-pay | Admitting: Hematology

## 2024-07-07 ENCOUNTER — Other Ambulatory Visit: Payer: Self-pay

## 2024-07-07 ENCOUNTER — Telehealth: Payer: Self-pay

## 2024-07-07 MED ORDER — TRAMADOL HCL 50 MG PO TABS: 50.0000 mg | ORAL_TABLET | Freq: Three times a day (TID) | ORAL | 0 refills | Status: DC | PRN

## 2024-07-07 NOTE — Telephone Encounter (Signed)
 Received telephone call from the patient reporting the Tylenol  #3 Rx needs to be changed due to side effects w/ her Liver damage. Let patient know I would forward her message to Dr. Lanny.

## 2024-07-08 NOTE — Progress Notes (Addendum)
 Surgical Instructions   Your procedure is scheduled on Thursday July 14, 2024. Report to [facility] Main Entrance A at 1:00 P.M., then check in with the Admitting office. Any questions or running late day of surgery: call [phone]  Questions prior to your surgery date: call [phone], Monday-Friday, 8am-4pm. If you experience any cold or flu symptoms such as cough, fever, chills, shortness of breath, etc. between now and your scheduled surgery, please notify us  at the above number.     Remember:  Do not eat after midnight the night before your surgery   You may drink clear liquids until 12:00 Noon the day of your surgery.   Clear liquids allowed are: Water, Non-Citrus Juices (without pulp), Carbonated Beverages, Clear Tea (no milk, honey, etc.), Black Coffee Only (NO MILK, CREAM OR POWDERED CREAMER of any kind), and Gatorade.  Patient Instructions  The night before surgery:  No food after midnight. ONLY clear liquids after midnight  The day of surgery (if you have diabetes): Drink ONE (1) 12 oz G2 given to you in your pre admission testing appointment by 12:00 Noon the day of surgery. Drink in one sitting. Do not sip.  This drink was given to you during your hospital  pre-op appointment visit.  Nothing else to drink after completing the  12 oz bottle of G2.         If you have questions, please contact your surgeon's office.  Take these medicines the morning of surgery with A SIP OF WATER  cetirizine  (ZYRTEC )  levothyroxine  (SYNTHROID )  lurasidone  (LATUDA )  methocarbamol  (ROBAXIN )  omeprazole  (PRILOSEC)  pregabalin  (LYRICA )   May take these medicines IF NEEDED: albuterol  (VENTOLIN  HFA) 108 (90 Base) MCG/ACT inhaler Please bring with you to the hospital diazepam  (VALIUM )  traMADol  (ULTRAM )   One week prior to surgery, STOP taking any Aspirin  (unless otherwise instructed by your surgeon) Aleve, Naproxen, Ibuprofen , Motrin , Advil , Goody's, BC's, all herbal  medications, fish oil, and non-prescription vitamins. This includes your diclofenac  (VOLTAREN ).   WHAT DO I DO ABOUT MY DIABETES MEDICATION?   Do not take oral diabetes medicines (pills) the morning of surgery.        DO NOT TAKE YOUR Semaglutide ,0.25 or 0.5MG /DOS, (OZEMPIC , 0.25 OR 0.5 MG/DOSE,) 7 DAYS PRIOR TO SURGERY WITH THE LAST DOSE BEING NO LATER THAN 07/06/2024  The day of surgery, do not take other diabetes injectables, including Byetta (exenatide), Bydureon (exenatide ER), Victoza (liraglutide), or Trulicity (dulaglutide).  If your CBG is greater than 220 mg/dL, you may take  of your sliding scale (correction) dose of insulin.   HOW TO MANAGE YOUR DIABETES BEFORE AND AFTER SURGERY  Why is it important to control my blood sugar before and after surgery? Improving blood sugar levels before and after surgery helps healing and can limit problems. A way of improving blood sugar control is eating a healthy diet by:  Eating less sugar and carbohydrates  Increasing activity/exercise  Talking with your doctor about reaching your blood sugar goals High blood sugars (greater than 180 mg/dL) can raise your risk of infections and slow your recovery, so you will need to focus on controlling your diabetes during the weeks before surgery. Make sure that the doctor who takes care of your diabetes knows about your planned surgery including the date and location.  How do I manage my blood sugar before surgery? Check your blood sugar at least 4 times a day, starting 2 days before surgery, to make sure that the level is not  too high or low.  Check your blood sugar the morning of your surgery when you wake up and every 2 hours until you get to the Short Stay unit.  If your blood sugar is less than 70 mg/dL, you will need to treat for low blood sugar: Do not take insulin. Treat a low blood sugar (less than 70 mg/dL) with  cup of clear juice (cranberry or apple), 4 glucose tablets, OR glucose  gel. Recheck blood sugar in 15 minutes after treatment (to make sure it is greater than 70 mg/dL). If your blood sugar is not greater than 70 mg/dL on recheck, call [phone] for further instructions. Report your blood sugar to the short stay nurse when you get to Short Stay.  If you are admitted to the hospital after surgery: Your blood sugar will be checked by the staff and you will probably be given insulin after surgery (instead of oral diabetes medicines) to make sure you have good blood sugar levels. The goal for blood sugar control after surgery is 80-180 mg/dL.                       Do NOT Smoke (Tobacco/Vaping) for 24 hours prior to your procedure.  If you use a CPAP at night, you may bring your mask/headgear for your overnight stay.   You will be asked to remove any contacts, glasses, piercing's, hearing aid's, dentures/partials prior to surgery. Please bring cases for these items if needed.    Patients discharged the day of surgery will not be allowed to drive home, and someone needs to stay with them for 24 hours.  SURGICAL WAITING ROOM VISITATION Patients may have no more than 2 support people in the waiting area - these visitors may rotate.   Pre-op nurse will coordinate an appropriate time for 1 ADULT support person, who may not rotate, to accompany patient in pre-op.  Children under the age of 22 must have an adult with them who is not the patient and must remain in the main waiting area with an adult.  If the patient needs to stay at the hospital during part of their recovery, the visitor guidelines for inpatient rooms apply.  Please refer to the [facility] website for the visitor guidelines for any additional information.   If you received a COVID test during your pre-op visit  it is requested that you wear a mask when out in public, stay away from anyone that may not be feeling well and notify your surgeon if you develop symptoms. If you have been in contact with  anyone that has tested positive in the last 10 days please notify you surgeon.      Pre-operative CHG Bathing Instructions   You can play a key role in reducing the risk of infection after surgery. Your skin needs to be as free of germs as possible. You can reduce the number of germs on your skin by washing with CHG (chlorhexidine  gluconate) soap before surgery. CHG is an antiseptic soap that kills germs and continues to kill germs even after washing.   DO NOT use if you have an allergy to chlorhexidine /CHG or antibacterial soaps. If your skin becomes reddened or irritated, stop using the CHG and notify one of our RNs at [phone].              TAKE A SHOWER THE NIGHT BEFORE SURGERY   Please keep in mind the following:  DO NOT shave, including legs and  underarms, 48 hours prior to surgery.   Place clean sheets on your bed the night before surgery Use a clean washcloth (not used since being washed) for shower. DO NOT sleep with pet's night before surgery.  CHG Shower Instructions:  Wash your face and private area with normal soap. If you choose to wash your hair, wash first with your normal shampoo.  After you use shampoo/soap, rinse your hair and body thoroughly to remove shampoo/soap residue.  Turn the water OFF and apply half the bottle of CHG soap to a CLEAN washcloth.  Apply CHG soap ONLY FROM YOUR NECK DOWN TO YOUR TOES (washing for 3-5 minutes)  DO NOT use CHG soap on face, private areas, open wounds, or sores.  Pay special attention to the area where your surgery is being performed.  If you are having back surgery, having someone wash your back for you may be helpful. Wait 2 minutes after CHG soap is applied, then you may rinse off the CHG soap.  Pat dry with a clean towel  Put on clean pajamas    Additional instructions for the day of surgery: If you choose, you may shower the morning of surgery with an antibacterial soap.  DO NOT APPLY any lotions, deodorants or perfumes.    Do not wear jewelry or makeup Do not wear nail polish, gel polish, artificial nails, or any other type of covering on natural nails (fingers and toes) Do not bring valuables to the hospital. [facility] is not responsible for valuables/personal belongings. Put on clean/comfortable clothes.  Please brush your teeth.  Ask your nurse before applying any prescription medications to the skin.

## 2024-07-11 ENCOUNTER — Other Ambulatory Visit: Payer: Self-pay

## 2024-07-11 ENCOUNTER — Other Ambulatory Visit: Payer: Self-pay | Admitting: General Surgery

## 2024-07-11 ENCOUNTER — Encounter (HOSPITAL_COMMUNITY): Payer: Self-pay

## 2024-07-11 ENCOUNTER — Encounter (HOSPITAL_COMMUNITY)
Admission: RE | Admit: 2024-07-11 | Discharge: 2024-07-11 | Disposition: A | Discharge status: Home / Self Care | Source: Ambulatory Visit | Attending: General Surgery | Admitting: General Surgery

## 2024-07-11 VITALS — BP 111/75 | HR 76 | Temp 98.5°F | Resp 17 | Ht 67.0 in | Wt 243.8 lb

## 2024-07-11 DIAGNOSIS — Z01812 Encounter for preprocedural laboratory examination: Secondary | ICD-10-CM | POA: Diagnosis present

## 2024-07-11 DIAGNOSIS — E119 Type 2 diabetes mellitus without complications: Secondary | ICD-10-CM | POA: Diagnosis not present

## 2024-07-11 DIAGNOSIS — R9431 Abnormal electrocardiogram [ECG] [EKG]: Secondary | ICD-10-CM | POA: Diagnosis not present

## 2024-07-11 DIAGNOSIS — Z01810 Encounter for preprocedural cardiovascular examination: Secondary | ICD-10-CM | POA: Diagnosis present

## 2024-07-11 DIAGNOSIS — Z01818 Encounter for other preprocedural examination: Secondary | ICD-10-CM | POA: Diagnosis not present

## 2024-07-11 DIAGNOSIS — Z170 Estrogen receptor positive status [ER+]: Secondary | ICD-10-CM

## 2024-07-11 LAB — COMPREHENSIVE METABOLIC PANEL WITH GFR
ALT: 59 U/L — ABNORMAL HIGH (ref 0–44)
AST: 43 U/L — ABNORMAL HIGH (ref 15–41)
Albumin: 3.8 g/dL (ref 3.5–5.0)
Alkaline Phosphatase: 118 U/L (ref 38–126)
Anion gap: 10 (ref 5–15)
BUN: 12 mg/dL (ref 8–23)
CO2: 26 mmol/L (ref 22–32)
Calcium: 9.1 mg/dL (ref 8.9–10.3)
Chloride: 103 mmol/L (ref 98–111)
Creatinine, Ser: 1.01 mg/dL — ABNORMAL HIGH (ref 0.44–1.00)
GFR, Estimated: >60 mL/min (ref >60)
Glucose, Bld: 94 mg/dL (ref 70–99)
Potassium: 4.2 mmol/L (ref 3.5–5.1)
Sodium: 139 mmol/L (ref 135–145)
Total Bilirubin: 0.7 mg/dL (ref 0.0–1.2)
Total Protein: 7.1 g/dL (ref 6.5–8.1)

## 2024-07-11 LAB — HEMOGLOBIN A1C
Hgb A1c MFr Bld: 5.3 % (ref 4.8–5.6)
Mean Plasma Glucose: 105.41 mg/dL

## 2024-07-11 LAB — GLUCOSE, CAPILLARY: Glucose-Capillary: 105 mg/dL — ABNORMAL HIGH (ref 70–99)

## 2024-07-11 NOTE — Progress Notes (Signed)
 PCP - Dr. Heron Sharper Cardiologist -   PPM/ICD - denies Device Orders - na Rep Notified - na  Chest x-ray - na EKG - PAT, 07/11/2024 Stress Test -  2016, [place], requested ECHO -  Cardiac Cath -   Sleep Study - denies CPAP - na  Type II diabetic. A1C 5.5 on 02/11/2024, current one drawn today at PAT. Blood sugar 105 at PAT Fasting Blood Sugar - 98-113  Checks Blood Sugar: once a month  Last dose of GLP1 agonist-  Ozempic  GLP1 instructions: 07/03/2024  Blood Thinner Instructions: denies Aspirin  Instructions:denies  ERAS Protcol -G2 until Noon  Anesthesia review: Yes. Seed placement, HTN, DM  Patient denies shortness of breath, fever, cough and chest pain at PAT appointment   All instructions explained to the patient, with a verbal understanding of the material. Patient agrees to go over the instructions while at home for a better understanding. Patient also instructed to self quarantine after being tested for COVID-19. The opportunity to ask questions was provided.

## 2024-07-12 ENCOUNTER — Ambulatory Visit
Admission: RE | Admit: 2024-07-12 | Discharge: 2024-07-12 | Disposition: A | Discharge status: Home / Self Care | Source: Ambulatory Visit | Attending: General Surgery | Admitting: General Surgery

## 2024-07-12 DIAGNOSIS — C50512 Malignant neoplasm of lower-outer quadrant of left female breast: Secondary | ICD-10-CM

## 2024-07-12 DIAGNOSIS — C50812 Malignant neoplasm of overlapping sites of left female breast: Secondary | ICD-10-CM | POA: Diagnosis not present

## 2024-07-12 HISTORY — PX: BREAST BIOPSY: SHX20

## 2024-07-12 NOTE — Progress Notes (Signed)
 CBC order was not released at PAT appointment and sample was no longer valid once order was released.  CBC will need to be collected on DOS.

## 2024-07-14 ENCOUNTER — Ambulatory Visit (HOSPITAL_COMMUNITY): Admitting: Physician Assistant

## 2024-07-14 ENCOUNTER — Ambulatory Visit
Admission: RE | Admit: 2024-07-14 | Discharge: 2024-07-14 | Disposition: A | Discharge status: Home / Self Care | Source: Ambulatory Visit | Attending: General Surgery | Admitting: General Surgery

## 2024-07-14 ENCOUNTER — Ambulatory Visit (HOSPITAL_COMMUNITY): Admitting: Anesthesiology

## 2024-07-14 ENCOUNTER — Encounter (HOSPITAL_COMMUNITY)
Admission: RE | Disposition: A | Discharge status: Home / Self Care | Payer: Self-pay | Source: Home / Self Care | Attending: General Surgery

## 2024-07-14 ENCOUNTER — Ambulatory Visit (HOSPITAL_COMMUNITY)
Admission: RE | Admit: 2024-07-14 | Discharge: 2024-07-14 | Disposition: A | Discharge status: Home / Self Care | Attending: General Surgery | Admitting: General Surgery

## 2024-07-14 ENCOUNTER — Other Ambulatory Visit: Payer: Self-pay

## 2024-07-14 ENCOUNTER — Encounter (HOSPITAL_COMMUNITY): Payer: Self-pay | Admitting: General Surgery

## 2024-07-14 DIAGNOSIS — Z170 Estrogen receptor positive status [ER+]: Secondary | ICD-10-CM | POA: Diagnosis not present

## 2024-07-14 DIAGNOSIS — E039 Hypothyroidism, unspecified: Secondary | ICD-10-CM | POA: Diagnosis not present

## 2024-07-14 DIAGNOSIS — D0512 Intraductal carcinoma in situ of left breast: Secondary | ICD-10-CM | POA: Diagnosis present

## 2024-07-14 DIAGNOSIS — Z01818 Encounter for other preprocedural examination: Secondary | ICD-10-CM

## 2024-07-14 DIAGNOSIS — Z1721 Progesterone receptor positive status: Secondary | ICD-10-CM | POA: Insufficient documentation

## 2024-07-14 DIAGNOSIS — G8918 Other acute postprocedural pain: Secondary | ICD-10-CM | POA: Diagnosis not present

## 2024-07-14 DIAGNOSIS — F418 Other specified anxiety disorders: Secondary | ICD-10-CM

## 2024-07-14 DIAGNOSIS — C50512 Malignant neoplasm of lower-outer quadrant of left female breast: Secondary | ICD-10-CM | POA: Diagnosis not present

## 2024-07-14 DIAGNOSIS — E119 Type 2 diabetes mellitus without complications: Secondary | ICD-10-CM | POA: Diagnosis not present

## 2024-07-14 DIAGNOSIS — K219 Gastro-esophageal reflux disease without esophagitis: Secondary | ICD-10-CM | POA: Insufficient documentation

## 2024-07-14 DIAGNOSIS — I10 Essential (primary) hypertension: Secondary | ICD-10-CM | POA: Insufficient documentation

## 2024-07-14 DIAGNOSIS — M797 Fibromyalgia: Secondary | ICD-10-CM | POA: Insufficient documentation

## 2024-07-14 DIAGNOSIS — C50912 Malignant neoplasm of unspecified site of left female breast: Secondary | ICD-10-CM

## 2024-07-14 DIAGNOSIS — Z7985 Long-term (current) use of injectable non-insulin antidiabetic drugs: Secondary | ICD-10-CM | POA: Diagnosis not present

## 2024-07-14 DIAGNOSIS — Z1732 Human epidermal growth factor receptor 2 negative status: Secondary | ICD-10-CM | POA: Diagnosis not present

## 2024-07-14 HISTORY — PX: BREAST LUMPECTOMY WITH RADIOACTIVE SEED AND SENTINEL LYMPH NODE BIOPSY: SHX6550

## 2024-07-14 LAB — CBC
HCT: 42 % (ref 36.0–46.0)
Hemoglobin: 14 g/dL (ref 12.0–15.0)
MCH: 30.8 pg (ref 26.0–34.0)
MCHC: 33.3 g/dL (ref 30.0–36.0)
MCV: 92.3 fL (ref 80.0–100.0)
Platelets: 194 K/uL (ref 150–400)
RBC: 4.55 MIL/uL (ref 3.87–5.11)
RDW: 13.5 % (ref 11.5–15.5)
WBC: 6.5 K/uL (ref 4.0–10.5)
nRBC: 0 % (ref 0.0–0.2)

## 2024-07-14 LAB — GLUCOSE, CAPILLARY
Glucose-Capillary: 105 mg/dL — ABNORMAL HIGH (ref 70–99)
Glucose-Capillary: 77 mg/dL (ref 70–99)

## 2024-07-14 SURGERY — BREAST LUMPECTOMY WITH RADIOACTIVE SEED AND SENTINEL LYMPH NODE BIOPSY
Anesthesia: General | Site: Breast | Laterality: Left

## 2024-07-14 MED ORDER — ACETAMINOPHEN 500 MG PO TABS: 1000.0000 mg | ORAL_TABLET | ORAL | Status: DC

## 2024-07-14 MED ORDER — ACETAMINOPHEN 325 MG PO TABS: 650.0000 mg | ORAL_TABLET | ORAL | Status: DC | PRN

## 2024-07-14 MED ORDER — OXYCODONE HCL 5 MG/5ML PO SOLN: 5.0000 mg | Freq: Once | ORAL | Status: AC | PRN

## 2024-07-14 MED ORDER — HYDROMORPHONE HCL 1 MG/ML IJ SOLN
INTRAMUSCULAR | Status: AC
  Filled 2024-07-14: qty 0.5

## 2024-07-14 MED ORDER — HEMOSTATIC AGENTS (NO CHARGE) OPTIME
TOPICAL | Status: DC | PRN
  Administered 2024-07-14 (×2): 1 via TOPICAL

## 2024-07-14 MED ORDER — ENSURE PRE-SURGERY PO LIQD: 296.0000 mL | Freq: Once | ORAL | Status: DC

## 2024-07-14 MED ORDER — SODIUM CHLORIDE 0.9% FLUSH: 3.0000 mL | Freq: Two times a day (BID) | INTRAVENOUS | Status: DC

## 2024-07-14 MED ORDER — MAGTRACE LYMPHATIC TRACER
INTRAMUSCULAR | Status: DC | PRN
  Administered 2024-07-14: 2 mL via INTRAMUSCULAR

## 2024-07-14 MED ORDER — CHLORHEXIDINE GLUCONATE CLOTH 2 % EX PADS: 6.0000 | MEDICATED_PAD | Freq: Once | CUTANEOUS | Status: DC

## 2024-07-14 MED ORDER — PROPOFOL 10 MG/ML IV BOLUS
INTRAVENOUS | Status: DC | PRN
  Administered 2024-07-14: 160 mg via INTRAVENOUS

## 2024-07-14 MED ORDER — SODIUM CHLORIDE 0.9% FLUSH
3.0000 mL | INTRAVENOUS | Status: DC | PRN
Start: 2024-07-14 — End: 2024-07-14

## 2024-07-14 MED ORDER — FENTANYL CITRATE (PF) 250 MCG/5ML IJ SOLN
INTRAMUSCULAR | Status: DC | PRN
  Administered 2024-07-14: 100 ug via INTRAVENOUS

## 2024-07-14 MED ORDER — OXYCODONE HCL 5 MG PO TABS
5.0000 mg | ORAL_TABLET | Freq: Once | ORAL | Status: AC | PRN
  Administered 2024-07-14: 5 mg via ORAL

## 2024-07-14 MED ORDER — LIDOCAINE 2% (20 MG/ML) 5 ML SYRINGE
INTRAMUSCULAR | Status: DC | PRN
  Administered 2024-07-14: 60 mg via INTRAVENOUS

## 2024-07-14 MED ORDER — INSULIN ASPART 100 UNIT/ML IJ SOLN: 0.0000 [IU] | INTRAMUSCULAR | Status: DC | PRN

## 2024-07-14 MED ORDER — DROPERIDOL 2.5 MG/ML IJ SOLN: 0.6250 mg | Freq: Once | INTRAMUSCULAR | Status: DC | PRN

## 2024-07-14 MED ORDER — ONDANSETRON HCL 4 MG/2ML IJ SOLN
INTRAMUSCULAR | Status: DC | PRN
  Administered 2024-07-14: 4 mg via INTRAVENOUS

## 2024-07-14 MED ORDER — BUPIVACAINE HCL (PF) 0.25 % IJ SOLN
INTRAMUSCULAR | Status: DC | PRN
  Administered 2024-07-14: 25 mL

## 2024-07-14 MED ORDER — OXYCODONE HCL 5 MG PO TABS: 5.0000 mg | ORAL_TABLET | ORAL | Status: DC | PRN

## 2024-07-14 MED ORDER — MIDAZOLAM HCL (PF) 2 MG/2ML IJ SOLN: 2.0000 mg | Freq: Once | INTRAMUSCULAR | Status: AC

## 2024-07-14 MED ORDER — FENTANYL CITRATE (PF) 100 MCG/2ML IJ SOLN
INTRAMUSCULAR | Status: AC
  Filled 2024-07-14: qty 2

## 2024-07-14 MED ORDER — MIDAZOLAM HCL 2 MG/2ML IJ SOLN
INTRAMUSCULAR | Status: AC
Start: 2024-07-14 — End: 2024-07-14
  Filled 2024-07-14: qty 2

## 2024-07-14 MED ORDER — HYDROMORPHONE HCL 1 MG/ML IJ SOLN
INTRAMUSCULAR | Status: DC | PRN
  Administered 2024-07-14: .5 mg via INTRAVENOUS

## 2024-07-14 MED ORDER — MIDAZOLAM HCL (PF) 2 MG/2ML IJ SOLN
INTRAMUSCULAR | Status: DC | PRN
  Administered 2024-07-14: 2 mg via INTRAVENOUS

## 2024-07-14 MED ORDER — PHENYLEPHRINE 80 MCG/ML (10ML) SYRINGE FOR IV PUSH (FOR BLOOD PRESSURE SUPPORT)
PREFILLED_SYRINGE | INTRAVENOUS | Status: DC | PRN
  Administered 2024-07-14: 80 ug via INTRAVENOUS

## 2024-07-14 MED ORDER — LACTATED RINGERS IV SOLN: INTRAVENOUS | Status: DC | PRN

## 2024-07-14 MED ORDER — BUPIVACAINE-EPINEPHRINE 0.25% -1:200000 IJ SOLN
INTRAMUSCULAR | Status: DC | PRN
  Administered 2024-07-14: 5 mL

## 2024-07-14 MED ORDER — DEXAMETHASONE SOD PHOSPHATE PF 10 MG/ML IJ SOLN
INTRAMUSCULAR | Status: DC | PRN
  Administered 2024-07-14: 5 mg via INTRAVENOUS

## 2024-07-14 MED ORDER — SODIUM CHLORIDE 0.9 % IV SOLN: 250.0000 mL | INTRAVENOUS | Status: DC | PRN

## 2024-07-14 MED ORDER — LACTATED RINGERS IV SOLN: INTRAVENOUS | Status: DC

## 2024-07-14 MED ORDER — 0.9 % SODIUM CHLORIDE (POUR BTL) OPTIME
TOPICAL | Status: DC | PRN
  Administered 2024-07-14: 1000 mL

## 2024-07-14 MED ORDER — OXYCODONE HCL 5 MG PO TABS: 5.0000 mg | ORAL_TABLET | Freq: Four times a day (QID) | ORAL | 0 refills | Status: DC | PRN

## 2024-07-14 MED ORDER — ACETAMINOPHEN 650 MG RE SUPP: 650.0000 mg | RECTAL | Status: DC | PRN

## 2024-07-14 MED ORDER — CEFAZOLIN SODIUM-DEXTROSE 2-4 GM/100ML-% IV SOLN
2.0000 g | INTRAVENOUS | Status: AC
  Administered 2024-07-14: 2 g via INTRAVENOUS
  Filled 2024-07-14: qty 100

## 2024-07-14 MED ORDER — ORAL CARE MOUTH RINSE: 15.0000 mL | Freq: Once | OROMUCOSAL | Status: AC

## 2024-07-14 MED ORDER — PHENYLEPHRINE HCL-NACL 20-0.9 MG/250ML-% IV SOLN
INTRAVENOUS | Status: DC | PRN
  Administered 2024-07-14: 35 ug/min via INTRAVENOUS

## 2024-07-14 MED ORDER — MIDAZOLAM HCL 2 MG/2ML IJ SOLN
INTRAMUSCULAR | Status: AC
  Administered 2024-07-14: 2 mg via INTRAVENOUS
  Filled 2024-07-14: qty 2

## 2024-07-14 MED ORDER — BUPIVACAINE-EPINEPHRINE (PF) 0.25% -1:200000 IJ SOLN
INTRAMUSCULAR | Status: AC
  Filled 2024-07-14: qty 30

## 2024-07-14 MED ORDER — OXYCODONE HCL 5 MG PO TABS
ORAL_TABLET | ORAL | Status: AC
  Filled 2024-07-14: qty 1

## 2024-07-14 MED ORDER — FENTANYL CITRATE (PF) 100 MCG/2ML IJ SOLN
25.0000 ug | INTRAMUSCULAR | Status: DC | PRN
  Administered 2024-07-14 (×3): 50 ug via INTRAVENOUS

## 2024-07-14 MED ORDER — CHLORHEXIDINE GLUCONATE 0.12 % MT SOLN
15.0000 mL | Freq: Once | OROMUCOSAL | Status: AC
  Administered 2024-07-14: 15 mL via OROMUCOSAL
  Filled 2024-07-14: qty 15

## 2024-07-14 MED ORDER — GABAPENTIN 100 MG PO CAPS
100.0000 mg | ORAL_CAPSULE | ORAL | Status: AC
  Administered 2024-07-14: 100 mg via ORAL
  Filled 2024-07-14: qty 1

## 2024-07-14 SURGICAL SUPPLY — 41 items
BAG COUNTER SPONGE SURGICOUNT (BAG) ×1 IMPLANT
BINDER BREAST LRG (GAUZE/BANDAGES/DRESSINGS) IMPLANT
BINDER BREAST XLRG (GAUZE/BANDAGES/DRESSINGS) IMPLANT
CANISTER SUCTION 3000ML PPV (SUCTIONS) ×1 IMPLANT
CHLORAPREP W/TINT 26 (MISCELLANEOUS) ×1 IMPLANT
CLIP APPLIE 9.375 MED OPEN (MISCELLANEOUS) ×1 IMPLANT
CNTNR URN SCR LID CUP LEK RST (MISCELLANEOUS) IMPLANT
COVER PROBE W GEL 5X96 (DRAPES) ×2 IMPLANT
COVER SURGICAL LIGHT HANDLE (MISCELLANEOUS) ×1 IMPLANT
DERMABOND ADVANCED .7 DNX12 (GAUZE/BANDAGES/DRESSINGS) ×1 IMPLANT
DEVICE DUBIN SPECIMEN MAMMOGRA (MISCELLANEOUS) ×1 IMPLANT
DRAPE CHEST BREAST 15X10 FENES (DRAPES) ×1 IMPLANT
ELECT COATED BLADE 2.86 ST (ELECTRODE) ×1 IMPLANT
ELECTRODE REM PT RTRN 9FT ADLT (ELECTROSURGICAL) ×1 IMPLANT
GLOVE BIO SURGEON STRL SZ7 (GLOVE) ×1 IMPLANT
GLOVE BIOGEL PI IND STRL 7.5 (GLOVE) ×1 IMPLANT
GOWN STRL REUS W/ TWL LRG LVL3 (GOWN DISPOSABLE) ×2 IMPLANT
ILLUMINATOR WAVEGUIDE N/F (MISCELLANEOUS) IMPLANT
KIT BASIN OR (CUSTOM PROCEDURE TRAY) ×1 IMPLANT
KIT MARKER MARGIN INK (KITS) ×1 IMPLANT
NDL 18GX1X1/2 (RX/OR ONLY) (NEEDLE) IMPLANT
NDL FILTER BLUNT 18X1 1/2 (NEEDLE) IMPLANT
NDL HYPO 25GX1X1/2 BEV (NEEDLE) ×1 IMPLANT
NEEDLE 18GX1X1/2 (RX/OR ONLY) (NEEDLE) IMPLANT
NEEDLE FILTER BLUNT 18X1 1/2 (NEEDLE) ×1 IMPLANT
NEEDLE HYPO 25GX1X1/2 BEV (NEEDLE) ×2 IMPLANT
PACK GENERAL/GYN (CUSTOM PROCEDURE TRAY) ×1 IMPLANT
POWDER SURGICEL 3.0 GRAM (HEMOSTASIS) IMPLANT
RETRACTOR ONETRAX LX 90X20 (MISCELLANEOUS) IMPLANT
SOLN 0.9% NACL POUR BTL 1000ML (IV SOLUTION) ×1 IMPLANT
STRIP CLOSURE SKIN 1/2X4 (GAUZE/BANDAGES/DRESSINGS) ×1 IMPLANT
STRIP CLOSURE SKIN 1/4X4 (GAUZE/BANDAGES/DRESSINGS) IMPLANT
SUT MNCRL AB 4-0 PS2 18 (SUTURE) ×2 IMPLANT
SUT MON AB 5-0 PS2 18 (SUTURE) IMPLANT
SUT SILK 2 0 SH (SUTURE) IMPLANT
SUT VIC AB 2-0 SH 27XBRD (SUTURE) ×2 IMPLANT
SUT VIC AB 3-0 SH 27X BRD (SUTURE) ×2 IMPLANT
SYR 3ML LL SCALE MARK (SYRINGE) IMPLANT
SYR CONTROL 10ML LL (SYRINGE) ×1 IMPLANT
TOWEL GREEN STERILE (TOWEL DISPOSABLE) ×1 IMPLANT
TOWEL GREEN STERILE FF (TOWEL DISPOSABLE) ×1 IMPLANT

## 2024-07-14 NOTE — Interval H&P Note (Signed)
 History and Physical Interval Note:  07/14/2024 12:55 PM  Tiffany Reese  has presented today for surgery, with the diagnosis of LEFT BREAST CANCER.  The various methods of treatment have been discussed with the patient and family. After consideration of risks, benefits and other options for treatment, the patient has consented to  Procedure(s): BREAST LUMPECTOMY WITH RADIOACTIVE SEED AND SENTINEL LYMPH NODE BIOPSY (Left) as a surgical intervention.  The patient's history has been reviewed, patient examined, no change in status, stable for surgery.  I have reviewed the patient's chart and labs.  Questions were answered to the patient's satisfaction.     Donnice Bury

## 2024-07-14 NOTE — H&P (Signed)
 62 yof with palpable left breast mass for two years. There is skin retraction and pain. No prior breast history. She has FH of colon/prostate/lung cancer. Mm shows b density tissue. There is a 3.6x2.5x1.8cm mass with no axillary nodes on US . Question involves CW. Biopsy is grade II IDC with DCIS, 95% er pos, 85% pr pos, her 2 negative and Ki is 65%. She has significant history of PTSD,fibromyalgia. We checked oncotype and was 15. Due to being on cw we decided to do neoadjuvant endocrine therapy. Her initial MRI shows a 3.3 x 3.1 x 2.1 cm mass tethered to the pectoralis major. This was in June. She began an aromatase inhibitor and really is unable to tolerate this due to her related comorbidities. We repeated her MRI and again this shows a 3.4 x 2.3 x 2.2 cm mass. There is nothing else suspicious and no abnormal lymph nodes. She is here to discuss her options for surgery now.  Review of Systems: A complete review of systems was obtained from the patient. I have reviewed this information and discussed as appropriate with the patient. See HPI as well for other ROS.  Review of Systems  Eyes: Positive for blurred vision.  Respiratory: Positive for hemoptysis.  Cardiovascular: Positive for palpitations.  Gastrointestinal: Positive for heartburn.  Musculoskeletal: Positive for neck pain.  Neurological: Positive for dizziness and headaches.  Endo/Heme/Allergies: Bruises/bleeds easily.  Psychiatric/Behavioral: Positive for depression. The patient is nervous/anxious.  All other systems reviewed and are negative.   Medical History: Past Medical History:  Diagnosis Date  Anemia  Anxiety  Arthritis  Asthma, unspecified asthma severity, unspecified whether complicated, unspecified whether persistent (HHS-HCC)  GERD (gastroesophageal reflux disease)  Thyroid  disease   Patient Active Problem List  Diagnosis  Malignant neoplasm of lower-outer quadrant of left breast of female, estrogen receptor  positive (CMS/HHS-HCC)   Past Surgical History:  Procedure Laterality Date  ankle surgery  HYSTERECTOMY  kidney surgery  ORIF ANKLE FRACTURE    Allergies  Allergen Reactions  Bupropion Other (See Comments)  Agitation  Causes agitation  Duloxetine Other (See Comments)  Involuntary movements  Causes involuntary movements  Fluoxetine Other (See Comments)  Makes me feel weird  Hyperactive, insomnia  Metformin Other (See Comments)  Ondansetron  Hcl Other (See Comments)  Paroxetine Hcl Other (See Comments)  agitation  Insomnia; manic  Sertraline Other (See Comments)  agitation  Hyperactivity, insomnia  Sulfa (Sulfonamide Antibiotics) Unknown  Vortioxetine Hydrobromide Other (See Comments)  vortioxetine hydrobromide   Current Outpatient Medications on File Prior to Visit  Medication Sig Dispense Refill  albuterol  MDI, PROVENTIL , VENTOLIN , PROAIR , HFA 90 mcg/actuation inhaler Inhale 2 inhalations into the lungs every 6 (six) hours as needed for Wheezing or Shortness of Breath  amitriptyline  (ELAVIL ) 75 MG tablet Take 75 mg by mouth at bedtime  cetirizine  (ZYRTEC ) 10 MG tablet Take 10 mg by mouth once daily  diazePAM  (VALIUM ) 2 MG tablet TAKE TWO TO THREE TAB DAILY AS NEEDED FOR ANXIETY  diclofenac  (VOLTAREN ) 1 % topical gel Apply topically as needed  diclofenac  (VOLTAREN ) 75 MG EC tablet TAKE 1 TABLET TWICE A DAY AS NEEDED FOR MODERATE PAIN  FUROsemide  (LASIX ) 20 MG tablet Take 20 mg by mouth once daily  levothyroxine  (SYNTHROID ) 50 MCG tablet Take 50 mcg by mouth every morning before breakfast (0630)  lurasidone  (LATUDA ) 60 mg tablet Take 60 mg by mouth  methocarbamoL  (ROBAXIN ) 750 MG tablet Take 750 mg by mouth every 8 (eight) hours as needed  multivitamin (THERA TAB) tablet  Take 1 tablet by mouth once daily  omeprazole  (PRILOSEC) 40 MG DR capsule Take 40 mg by mouth once daily  pregabalin  (LYRICA ) 50 MG capsule Take 50 mg by mouth 3 (three) times daily  semaglutide   (OZEMPIC ) 0.25 mg or 0.5 mg(2 mg/1.5 mL) pen injector Inject 0.5 mg subcutaneously every 7 (seven) days  vitamin E 400 UNIT capsule Take 400 Units by mouth once daily    Family History  Problem Relation Age of Onset  High blood pressure (Hypertension) Father  Hyperlipidemia (Elevated cholesterol) Father  Colon cancer Father  Myocardial Infarction (Heart attack) Father    Social History   Tobacco Use  Smoking Status Never  Smokeless Tobacco Never  Marital status: Divorced  Tobacco Use  Smoking status: Never  Smokeless tobacco: Never  Substance and Sexual Activity  Drug use: Yes  Types: Marijuana   Objective:   Vitals:  06/23/24 1425  PainSc: 0-No pain    Physical Exam  Constitutional:  Appearance: Normal appearance.  Chest:  Breasts: Right: No inverted nipple, mass or nipple discharge.  Left: Mass present. No inverted nipple or nipple discharge.  Comments: 4 cm left liq mass, ? fixed to chest wall nontender now Lymphadenopathy:  Upper Body:  Right upper body: No supraclavicular or axillary adenopathy.  Left upper body: No supraclavicular or axillary adenopathy.  Neurological:  Mental Status: She is alert.   Assessment and Plan:   Left breast seed guided lumpectomy, left axillary sentinel lymph node biopsy  This is not really changed since her initial imaging. The goal was to try to shrink this some before proceeding with surgery but this is not going to be an option. She is not a candidate for chemotherapy given her Oncotype. She is not able to do the aromatase inhibitor for now. She would like to just proceed with surgery.  We discussed a sentinel lymph node biopsy as she does not appear to having lymph node involvement right now. We discussed the performance of that with injection of mag trace with possible discoloration afterwards. We discussed that there is a chance of having a positive node with a sentinel lymph node biopsy and we will await the permanent  pathology to make any other first further decisions in terms of her treatment. We discussed up to a 5% risk lifetime of chronic shoulder pain as well as lymphedema associated with a sentinel lymph node biopsy.  We discussed the options for treatment of the breast cancer which included lumpectomy versus a mastectomy. We discussed the performance of the lumpectomy with radioactive seed placement. We discussed a 5-10% chance of a positive margin requiring reexcision in the operating room. We also discussed that she will likely need radiation therapy if she undergoes lumpectomy. We discussed mastectomy and the postoperative care for that as well. Mastectomy can be followed by reconstruction. The decision for lumpectomy vs mastectomy has no impact on decision for chemotherapy. Most mastectomy patients will not need radiation therapy. We discussed that there is no difference in her survival whether she undergoes lumpectomy with radiation therapy or antiestrogen therapy versus a mastectomy. There is also no real difference between her recurrence in the breast. I think we can attempt a lumpectomy. This certainly is in her lower inner quadrant and is a fairly big area so I think it will flatten this out. I may very well need to remove some of her chest wall in this area due to the way it appears on imaging although on exam it does feel like  it is more mobile right now. She understands all of this. We had a long discussion about mastectomy versus lumpectomy and have elected to proceed with a lumpectomy. We discussed the risks of operation including bleeding, infection, possible reoperation. She understands her further therapy will be based on what her stages at the time of her operation.

## 2024-07-14 NOTE — Discharge Instructions (Signed)
 [facility] Office Phone Number [phone]  POST OP INSTRUCTIONS Take 400 mg of ibuprofen every 8 hours or 650 mg tylenol  every 6 hours for next 72 hours then as needed. Use ice several times daily also.  A prescription for pain medication may be given to you upon discharge.  Take your pain medication as prescribed, if needed.  If narcotic pain medicine is not needed, then you may take acetaminophen  (Tylenol ), naprosyn (Alleve) or ibuprofen (Advil) as needed. Take your usually prescribed medications unless otherwise directed If you need a refill on your pain medication, please contact your pharmacy.  They will contact our office to request authorization.  Prescriptions will not be filled after 5pm or on week-ends. You should eat very light the first 24 hours after surgery, such as soup, crackers, pudding, etc.  Resume your normal diet the day after surgery. Most patients will experience some swelling and bruising in the breast.  Ice packs and a good support bra will help.  Wear the breast binder provided or a sports bra for 72 hours day and night.  After that wear a sports bra during the day until you return to the office. Swelling and bruising can take several days to resolve.  It is common to experience some constipation if taking pain medication after surgery.  Increasing fluid intake and taking a stool softener will usually help or prevent this problem from occurring.  A mild laxative (Milk of Magnesia or Miralax) should be taken according to package directions if there are no bowel movements after 48 hours. I used skin glue on the incision, you may shower in 24 hours.  The glue will flake off over the next 2-3 weeks as will the steristrips.   Any sutures or staples will be removed at the office during your follow-up visit. ACTIVITIES:  You may resume regular daily activities (gradually increasing) beginning the next day.  Wearing a good support bra or sports bra minimizes pain and  swelling.  You may have sexual intercourse when it is comfortable. You may drive when you no longer are taking prescription pain medication, you can comfortably wear a seatbelt, and you can safely maneuver your car and apply brakes. RETURN TO WORK:  ______________________________________________________________________________________ Tiffany Reese should see your doctor in the office for a follow-up appointment approximately two to three weeks after your surgery.  Your doctor's nurse will typically make your follow-up appointment when she calls you with your pathology report.  Expect your pathology report 3-4 business days after your surgery.  You may call to check if you do not hear from us  after three days.  WHEN TO CALL DR Tiffany Reese: Fever over 101.0 Nausea and/or vomiting. Extreme swelling or bruising. Continued bleeding from incision. Increased pain, redness, or drainage from the incision.  The clinic staff is available to answer your questions during regular business hours.  Please don't hesitate to call and ask to speak to one of the nurses for clinical concerns.  If you have a medical emergency, go to the nearest emergency room or call 911.  A surgeon from [facility] is always on call at the hospital.  For further questions, please visit [email or web address]

## 2024-07-14 NOTE — Transfer of Care (Signed)
 Immediate Anesthesia Transfer of Care Note  Patient: Tiffany Reese  Procedure(s) Performed: BREAST LUMPECTOMY WITH RADIOACTIVE SEED AND SENTINEL LYMPH NODE BIOPSY (Left: Breast)  Patient Location: PACU  Anesthesia Type:General  Level of Consciousness: awake, alert , and oriented  Airway & Oxygen Therapy: Patient Spontanous Breathing  Post-op Assessment: Report given to RN and Post -op Vital signs reviewed and stable  Post vital signs: Reviewed and stable  Last Vitals:  Vitals Value Taken Time  BP 129/95 07/14/24 14:56  Temp    Pulse 82 07/14/24 14:58  Resp 23 07/14/24 14:58  SpO2 96 % 07/14/24 14:58  Vitals shown include unfiled device data.  Last Pain:  Vitals:   07/14/24 1213  TempSrc:   PainSc: 5       Patients Stated Pain Goal: 3 (07/14/24 1213)  Complications: No notable events documented.

## 2024-07-14 NOTE — Anesthesia Procedure Notes (Addendum)
 Anesthesia Regional Block: Pectoralis block   Pre-Anesthetic Checklist: , timeout performed,  Correct Patient, Correct Site, Correct Laterality,  Correct Procedure, Correct Position, site marked,  Risks and benefits discussed,  Surgical consent,  Pre-op evaluation,  At surgeon's request and post-op pain management  Laterality: Left  Prep: chloraprep       Needles:  Injection technique: Single-shot  Needle Type: Stimiplex     Needle Length: 9cm  Needle Gauge: 21     Additional Needles:   Procedures:,,,, ultrasound used (permanent image in chart),,    Narrative:  Start time: 07/14/2024 12:50 PM End time: 07/14/2024 12:55 PM  Performed by: Personally  Anesthesiologist: Boone Fess, MD  Additional Notes: Patient's chart reviewed and they were deemed appropriate candidate for procedure, per surgeon's request. Patient educated about risks, benefits, and alternatives of the block including but not limited to: temporary or permanent nerve damage, bleeding, infection, damage to surround tissues, pneumothorax, block failure, local anesthetic toxicity. Patient expressed understanding. A formal time-out was conducted consistent with institution rules.  Monitors were applied, and minimal sedation used (see nursing record). The site was prepped with skin prep and allowed to dry, and sterile gloves were used. A high frequency linear ultrasound probe with probe cover was utilized throughout. Ribs visualized on ultrasound in the anterior axillary line, and fourth rib located. Pectoralis major, pectoralis minor, and serratus muscles identified. Image appeared anatomically normal. Needle trajectory visualized throughout. Aspiration performed every 5ml and was negative. Half of injectate delivered between serratus and fascial plane below it (serratus plane block) followed by half of injectate between pectoralis major and pectoralis minor (PECSI). Blood vessels and pleura were avoided. All injections  were performed without resistance and free of blood and paresthesias. The patient tolerated the procedure well.  Injectate: 25ml 0.25% bupivacaine 

## 2024-07-14 NOTE — Op Note (Signed)
 Preoperative diagnosis: Left breast cancer Postoperative diagnosis: Same as above Procedure: 1.  Left breast radioactive seed guided lumpectomy 2.  Injection of mag trace for sentinel lymph node identification 3.  Left deep axillary sentinel lymph node biopsy Surgeon: Dr. Adina Bury Anesthesia: General With a pectoral block Estimated blood loss: Less than 50 cc Drains: None Complications: None Specimens: 1.  Left breast tissue containing seed and clip marked with paint 2.  Additional medial and superior margins marked short superior, long lateral, double deep 3.  Left deep axillary sentinel lymph nodes with highest count of 2822 Sponge and needle count was correct at completion Disposition to recovery stable condition  Indications: This is a 63 year old female with a palpable left breast mass for 2 years.  This was a 3.6 cm mass on ultrasound.  There was no axillary nodal involvement.  It appeared to involve the chest wall.  This was a grade 2 invasive ductal carcinoma that was ER/PR positive.  Oncotype was 15.  An MRI showed a 3.3 cm mass tethered to the pectoralis major.  We attempted to do aromatase inhibitor therapy but she could not tolerated.  She just wanted to proceed with surgery.  We discussed lumpectomy and node biopsy.  Procedure: After informed consent was obtained she was taken to the OR.  She was given antibiotics.  SCDs were in place.  She underwent a pectoral block.  She was then placed under general anesthesia without complication.  She was prepped and draped in standard sterile surgical fashion.  A surgical timeout was then performed.  I then injected 2 cc of mag trace in the subareolar position and massaged this for 5 minutes.  There was activity in the axilla.  I did the lumpectomy first.  This was palpable.  I made an inframammary fold incision and released the anterior tissue at the skin level.  I then was able to come around the mass.  The seed was contained in  this.  I then remove the mass and a rim of normal tissue around it in order to get a clear margin.  I did remove the pectoralis immediately posterior to this to get a clear margin.  I then passed this off the table and confirmed that I had remove the seed and the clip.  This had been marked with paint.  I did remove 2 additional margins that I thought might be close on CT imaging.  I then obtained hemostasis.  I placed a couple clips in this cavity.  I closed down the breast tissue with 2-0 Vicryl.  The skin was closed with 3-0 Vicryl and 4-0 Monocryl.  Glue and Steri-Strips were applied.  I then made an incision below the axillary hairline.  I carried this through the axillary fascia.  I was then able to identify a couple of different areas that had activity from the [place] trace.  I remove these.  The highest count is listed as above.  There was really no background activity.  There were no additional abnormal nodes.  I did place some Surgicel powder in the axilla.  I then closed this with 2-0 Vicryl, 3-0 Vicryl and 4-0 Monocryl.  Glue and Steri-Strips were applied.  She tolerated this well and was transferred recovery stable.

## 2024-07-14 NOTE — Anesthesia Postprocedure Evaluation (Signed)
 Anesthesia Post Note  Patient: Tiffany Reese  Procedure(s) Performed: BREAST LUMPECTOMY WITH RADIOACTIVE SEED AND SENTINEL LYMPH NODE BIOPSY (Left: Breast)     Patient location during evaluation: PACU Anesthesia Type: General Level of consciousness: awake and alert Pain management: pain level controlled Vital Signs Assessment: post-procedure vital signs reviewed and stable Respiratory status: spontaneous breathing, nonlabored ventilation, respiratory function stable and patient connected to nasal cannula oxygen Cardiovascular status: blood pressure returned to baseline and stable Postop Assessment: no apparent nausea or vomiting Anesthetic complications: no   No notable events documented.  Last Vitals:  Vitals:   07/14/24 1500 07/14/24 1515  BP: 127/69 115/68  Pulse: 82 71  Resp: (!) 9 18  Temp: 36.7 C   SpO2: 96% 91%    Last Pain:  Vitals:   07/14/24 1509  TempSrc:   PainSc: 7                  Rome Ade

## 2024-07-14 NOTE — Anesthesia Preprocedure Evaluation (Signed)
 Anesthesia Evaluation  Patient identified by MRN, date of birth, ID band Patient awake    Reviewed: Allergy & Precautions, NPO status , Patient's Chart, lab work & pertinent test results  History of Anesthesia Complications Negative for: history of anesthetic complications  Airway Mallampati: II  TM Distance: >3 FB Neck ROM: Full    Dental  (+) Edentulous Upper, Edentulous Lower   Pulmonary neg pulmonary ROS, neg sleep apnea, neg COPD, Patient abstained from smoking.Not current smoker   Pulmonary exam normal breath sounds clear to auscultation       Cardiovascular Exercise Tolerance: Good METShypertension, Pt. on medications (-) CAD and (-) Past MI (-) dysrhythmias  Rhythm:Regular Rate:Normal - Systolic murmurs    Neuro/Psych  Headaches PSYCHIATRIC DISORDERS Anxiety Depression     Neuromuscular disease    GI/Hepatic ,GERD  Medicated and Controlled,,(+)     (-) substance abuse    Endo/Other  diabetes, Type 2Hypothyroidism  GLP1ra held for > 7 days , denies GI symptoms  Renal/GU negative Renal ROS     Musculoskeletal  (+) Arthritis ,  Fibromyalgia -  Abdominal   Peds  Hematology   Anesthesia Other Findings Past Medical History: No date: Anemia No date: Anxiety No date: Arthritis 2019: Colitis No date: Colon polyps No date: Depression, major No date: Diabetes mellitus type II, controlled (HCC) No date: Family history of colon cancer No date: Family history of prostate cancer No date: Fibromyalgia No date: Frequent headaches     Comment:  used to have severe migraines No date: GERD (gastroesophageal reflux disease) No date: Hepatic steatosis No date: Hypertension No date: Hypothyroid No date: Obesity No date: PTSD (post-traumatic stress disorder)  Reproductive/Obstetrics                              Anesthesia Physical Anesthesia Plan  ASA: 3  Anesthesia Plan: General    Post-op Pain Management: Regional block* and Gabapentin  PO (pre-op)*   Induction: Intravenous  PONV Risk Score and Plan: 3 and Ondansetron , Dexamethasone  and Midazolam   Airway Management Planned: LMA  Additional Equipment: None  Intra-op Plan:   Post-operative Plan: Extubation in OR  Informed Consent: I have reviewed the patients History and Physical, chart, labs and discussed the procedure including the risks, benefits and alternatives for the proposed anesthesia with the patient or authorized representative who has indicated his/her understanding and acceptance.     Dental advisory given  Plan Discussed with: CRNA and Surgeon  Anesthesia Plan Comments: (Discussed risks of anesthesia with patient, including PONV, sore throat, lip/dental/eye damage. Rare risks discussed as well, such as cardiorespiratory and neurological sequelae, and allergic reactions. Discussed the role of CRNA in patient's perioperative care. Patient understands. Discussed r/b/a of PECSII block, including:  - bleeding, infection, nerve damage - poor or non functioning block. - reactions and toxicity to local anesthetic - pneumothorax Patient understands. )        Anesthesia Quick Evaluation

## 2024-07-14 NOTE — Anesthesia Procedure Notes (Signed)
 Procedure Name: LMA Insertion Date/Time: 07/14/2024 1:29 PM  Performed by: Arvell Edsel HERO, CRNAPre-anesthesia Checklist: Patient identified, Emergency Drugs available, Patient being monitored, Suction available and Timeout performed Patient Re-evaluated:Patient Re-evaluated prior to induction Oxygen Delivery Method: Circle system utilized Preoxygenation: Pre-oxygenation with 100% oxygen Induction Type: IV induction LMA: LMA inserted LMA Size: 4.0 Number of attempts: 1 Placement Confirmation: positive ETCO2 and breath sounds checked- equal and bilateral Tube secured with: Tape

## 2024-07-15 ENCOUNTER — Encounter (HOSPITAL_COMMUNITY): Payer: Self-pay | Admitting: General Surgery

## 2024-07-18 LAB — SURGICAL PATHOLOGY

## 2024-07-20 ENCOUNTER — Encounter: Payer: Self-pay | Admitting: *Deleted

## 2024-07-27 ENCOUNTER — Inpatient Hospital Stay: Attending: Hematology | Admitting: Hematology

## 2024-07-27 DIAGNOSIS — M797 Fibromyalgia: Secondary | ICD-10-CM | POA: Diagnosis not present

## 2024-07-27 DIAGNOSIS — Z1721 Progesterone receptor positive status: Secondary | ICD-10-CM | POA: Insufficient documentation

## 2024-07-27 DIAGNOSIS — C50512 Malignant neoplasm of lower-outer quadrant of left female breast: Secondary | ICD-10-CM | POA: Diagnosis not present

## 2024-07-27 DIAGNOSIS — Z170 Estrogen receptor positive status [ER+]: Secondary | ICD-10-CM | POA: Diagnosis not present

## 2024-07-27 DIAGNOSIS — Z79811 Long term (current) use of aromatase inhibitors: Secondary | ICD-10-CM | POA: Insufficient documentation

## 2024-07-27 DIAGNOSIS — Z1732 Human epidermal growth factor receptor 2 negative status: Secondary | ICD-10-CM | POA: Diagnosis not present

## 2024-07-27 NOTE — Progress Notes (Signed)
 [facility]   [phone] Fax:([phone]) [phone]   Clinic Follow up Note   Patient Care Team: Ozell Heron HERO, MD as PCP - General (Family Medicine) Lionell Jon DEL, [facility] (Pharmacist) Tyree Nanetta SAILOR, RN as Oncology Nurse Navigator Ebbie Cough, MD as Consulting Physician (General Surgery) Lanny Callander, MD as Consulting Physician (Hematology) Dewey Rush, MD as Consulting Physician (Radiation Oncology)  Date of Service:  07/27/2024  CHIEF COMPLAINT: f/u of left breast cancer  CURRENT THERAPY:  Pending adjuvant radiation  Oncology History   Malignant neoplasm of lower-outer quadrant of left breast of female, estrogen receptor positive (HCC)  cT2(vs 4)N0M0, stage IB, ER+/PR+/HER2-, G2  -Presented with a palpable right breast mass for 2 years -Diagnosed on April 01, 2024. - breast MRI showed 3.3 cm biopsy-proven malignancy over the posterior central mid to lower left breast. This mass abuts and causes anterior tethering of the adjacent pectoralis with evidence of focal adjacent pectoralis muscle invasion - She started neoadjuvant exemestane  in March 17, 2024. Due to poor tolerance she stopped in Sep 2025 - She underwent lumpectomy and a sentinel lymph node biopsy on October 23rd 2025, which showed grade 2 invasive ductal carcinoma, 3.5 cm, with negative margins.  Sentinel lymph nodes were negative.  Assessment & Plan Malignant neoplasm of lower-outer quadrant of left breast, post-lumpectomy, planned adjuvant radiation and endocrine therapy Post-lumpectomy status with negative margins and negative lymph nodes. Tumor size was over 3 cm. Oncotype score indicates low risk, negating the need for chemotherapy. Incision healing well without complications. Discussed adjuvant radiation therapy post-recovery. Consideration of low-dose tamoxifen post-radiation due to fibromyalgia and previous intolerance to aromatase inhibitors. Tamoxifen offers less joint pain and is  an estrogen-blocking agent with a small risk of blood clots, mitigated by staying active. She prefers quality of life over extended treatment duration, opting for 15 years of good health over 20 years with medication side effects. - Proceed with adjuvant radiation therapy post-recovery. - Will consider low-dose tamoxifen post-radiation, pending discussion with her daughter. - Scheduled follow-up appointment in January post-radiation.  Fibromyalgia Chronic fibromyalgia with increased pain sensitivity post-surgery. Current management includes Lyrica  and oxycodone  for pain control. Symptoms include swelling and tenderness in the axillary region, exacerbated by fibromyalgia. - Continue current pain management with Lyrica  and oxycodone . - Discuss fibromyalgia management with Dr. Ozell on November 25th.  Plan - I reviewed her surgical pathology, margins were negative. - Oncotype recurrence score is low, adjuvant chemotherapy not recommended - She is scheduled to see radiation oncologist Dr. Dewey on November 17 - I plan to see her back in mid January 2026, to finalize her adjuvant tamoxifen.    SUMMARY OF ONCOLOGIC HISTORY: Oncology History  Malignant neoplasm of lower-outer quadrant of left breast of female, estrogen receptor positive (HCC)  03/02/2024 Cancer Staging   Staging form: Breast, AJCC 8th Edition - Clinical stage from 03/02/2024: Stage IB (cT2, cN0, cM0, G2, ER+, PR+, HER2-) - Signed by Lanny Callander, MD on 03/16/2024 Histologic grading system: 3 grade system   03/07/2024 Initial Diagnosis   Malignant neoplasm of lower-outer quadrant of left breast of female, estrogen receptor positive (HCC)   03/28/2024 Genetic Testing   Negative genetic testing on the CancerNext-Expanded+RNainsight panel.  The report date is March 28, 2024.  The CancerNext-Expanded gene panel offered by [facility] and includes sequencing, rearrangement, and RNA analysis for the following 77 genes: AIP, ALK, APC,  ATM, BAP1, BARD1, BMPR1A, BRCA1, BRCA2, BRIP1, CDC73, CDH1, CDK4, CDKN1B, CDKN2A, CEBPA, CHEK2, CTNNA1,  DDX41, DICER1, ETV6, FH, FLCN, GATA2, LZTR1, MAX, MBD4, MEN1, MET, MLH1, MSH2, MSH3, MSH6, MUTYH, NF1, NF2, NTHL1, PALB2, PHOX2B, PMS2, POT1, PRKAR1A, PTCH1, PTEN, RAD51C, RAD51D, RB1, RET, RPS20, RUNX1, SDHA, SDHAF2, SDHB, SDHC, SDHD, SMAD4, SMARCA4, SMARCB1, SMARCE1, STK11, SUFU, TMEM127, TP53, TSC1, TSC2, VHL, and WT1 (sequencing and deletion/duplication); AXIN2, CTNNA1, DDX41, EGFR, HOXB13, KIT, MBD4, MITF, MSH3, PDGFRA, POLD1 and POLE (sequencing only); EPCAM and GREM1 (deletion/duplication only). RNA data is routinely analyzed for use in variant interpretation for all genes.       Discussed the use of AI scribe software for clinical note transcription with the patient, who gave verbal consent to proceed.  History of Present Illness Tiffany Reese is a 63 year old female with breast cancer who presents for follow-up.  She underwent surgical removal of her breast tumor, which was more painful than anticipated, likely exacerbated by her fibromyalgia. She describes a sensation of an 'egg' under her armpit, which has now reduced to feeling like a 'marble'.  She is currently taking Lyrica  for fibromyalgia, which she takes upon waking, and a 5 mg dose of oxycodone  at 1 PM. She also takes thyroid  medication. She plans to discuss her fibromyalgia in an upcoming appointment.  She describes the pain post-surgery as significant, noting that she was in pain and crying before waking up from surgery.     All other systems were reviewed with the patient and are negative.  MEDICAL HISTORY:  Past Medical History:  Diagnosis Date   Anemia    Anxiety    Arthritis    Colitis 2019   Colon polyps    Depression, major    Diabetes mellitus type II, controlled (HCC)    Family history of colon cancer    Family history of prostate cancer    Fibromyalgia    Frequent headaches    used to have severe  migraines   GERD (gastroesophageal reflux disease)    Hepatic steatosis    Hypertension    Hypothyroid    Obesity    PTSD (post-traumatic stress disorder)     SURGICAL HISTORY: Past Surgical History:  Procedure Laterality Date   ANKLE SURGERY Right    fracture repair   BREAST BIOPSY Left 03/02/2024   US  LT BREAST BX W LOC DEV 1ST LESION IMG BX SPEC US  GUIDE 03/02/2024 [facility]   BREAST BIOPSY Left 07/12/2024   US  LT RADIOACTIVE SEED LOC 07/12/2024 [facility]   BREAST LUMPECTOMY WITH RADIOACTIVE SEED AND SENTINEL LYMPH NODE BIOPSY Left 07/14/2024   Procedure: BREAST LUMPECTOMY WITH RADIOACTIVE SEED AND SENTINEL LYMPH NODE BIOPSY;  Surgeon: Ebbie Cough, MD;  Location: [facility] OR;  Service: General;  Laterality: Left;   COLONOSCOPY  2015   KIDNEY SURGERY     stenting   ORIF ANKLE FRACTURE Left 11/05/2018   Procedure: OPEN REDUCTION INTERNAL FIXATION (ORIF) LEFT TRIMALLEOLAR ANKLE FRACTURE WITH OR WITHOUT FIXATION OF POSTERIOR LIP, XR 3V LEFT ANKLE;  Surgeon: Beverley Evalene BIRCH, MD;  Location: [facility];  Service: Orthopedics;  Laterality: Left;   VAGINAL HYSTERECTOMY  2015   ovaries remain, benign findings, was done for abnormal pap's    I have reviewed the social history and family history with the patient and they are unchanged from previous note.  ALLERGIES:  is allergic to bupropion, duloxetine, sulfa antibiotics, metformin and related, paxil [paroxetine hcl], prozac [fluoxetine hcl], trintellix [vortioxetine], tylenol  [acetaminophen ], and zoloft [sertraline hcl].  MEDICATIONS:  Current Outpatient Medications  Medication Sig Dispense Refill  ACCU-CHEK AVIVA PLUS test strip USE AS DIRECTED UP TO 4 TIMES A DAY 100 strip 1   albuterol  (VENTOLIN  HFA) 108 (90 Base) MCG/ACT inhaler TAKE 2 PUFFS BY MOUTH EVERY 6 HOURS AS NEEDED FOR WHEEZE OR SHORTNESS OF BREATH 8.5 each 2   amitriptyline  (ELAVIL ) 75 MG tablet Take 1 tablet (75 mg total) by mouth at  bedtime. 30 tablet 2   Blood Pressure Monitoring (BLOOD PRESSURE MONITOR/L CUFF) MISC 1 Device by Does not apply route daily. 1 each 0   cetirizine  (ZYRTEC ) 10 MG tablet TAKE 1 TABLET BY MOUTH EVERY DAY (Patient taking differently: Take 10 mg by mouth in the morning.) 90 tablet 1   diazepam  (VALIUM ) 2 MG tablet Take two to three tab daily as needed for anxiety 90 tablet 2   diclofenac  (VOLTAREN ) 75 MG EC tablet TAKE 1 TABLET TWICE A DAY AS NEEDED FOR MODERATE PAIN (Patient taking differently: Take 75 mg by mouth 2 (two) times daily.) 180 tablet 1   exemestane  (AROMASIN ) 25 MG tablet TAKE 1 TABLET (25 MG TOTAL) BY MOUTH DAILY AFTER BREAKFAST. 30 tablet 2   furosemide  (LASIX ) 20 MG tablet TAKE 1 TABLET BY MOUTH EVERY DAY 90 tablet 1   levothyroxine  (SYNTHROID ) 50 MCG tablet TAKE 1 TABLET BY MOUTH EVERY DAY 90 tablet 1   lurasidone  (LATUDA ) 40 MG TABS tablet Take 1 tablet (40 mg total) by mouth daily with breakfast. 30 tablet 2   Menthol, Topical Analgesic, (BENGAY EX) Apply 1 application  topically as needed (elbow/wrist/back/hip/knee pain.).     methocarbamol  (ROBAXIN ) 750 MG tablet TAKE 1 TABLET (750 MG TOTAL) BY MOUTH EVERY 8 (EIGHT) HOURS AS NEEDED FOR MUSCLE SPASMS. (Patient taking differently: Take 750 mg by mouth in the morning and at bedtime.) 90 tablet 1   Multiple Vitamin (MULTIVITAMIN WITH MINERALS) TABS tablet Take 1 tablet by mouth daily. Women's 50+     omeprazole  (PRILOSEC) 40 MG capsule Take 1 capsule (40 mg total) by mouth daily. 90 capsule 1   oxyCODONE  (OXY IR/ROXICODONE ) 5 MG immediate release tablet Take 1 tablet (5 mg total) by mouth every 6 (six) hours as needed. 10 tablet 0   pregabalin  (LYRICA ) 100 MG capsule Take 100 mg by mouth 2 (two) times daily.     Semaglutide ,0.25 or 0.5MG /DOS, (OZEMPIC , 0.25 OR 0.5 MG/DOSE,) 2 MG/3ML SOPN INJECT 0.5 MG INTO THE SKIN ONE TIME PER WEEK (Patient taking differently: Inject 0.5 mg into the skin every Sunday.) 9 mL 0   traMADol  (ULTRAM ) 50 MG  tablet Take 1 tablet (50 mg total) by mouth every 8 (eight) hours as needed for severe pain (pain score 7-10). 20 tablet 0   No current facility-administered medications for this visit.    PHYSICAL EXAMINATION: ECOG PERFORMANCE STATUS: 1 - Symptomatic but completely ambulatory  Vitals:   07/27/24 1406  BP: 130/80  Pulse: 79  Resp: 17  Temp: (!) 97.5 F (36.4 C)  SpO2: 96%   Wt Readings from Last 3 Encounters:  07/27/24 240 lb (108.9 kg)  07/14/24 240 lb (108.9 kg)  07/11/24 243 lb 12.8 oz (110.6 kg)     GENERAL:alert, no distress and comfortable SKIN: skin color, texture, turgor are normal, no rashes or significant lesions EYES: normal, Conjunctiva are pink and non-injected, sclera clear NECK: supple, thyroid  normal size, non-tender, without nodularity LYMPH:  no palpable lymphadenopathy in the cervical, axillary  LUNGS: clear to auscultation and percussion with normal breathing effort HEART: regular rate & rhythm and no murmurs and  no lower extremity edema ABDOMEN:abdomen soft, non-tender and normal bowel sounds Musculoskeletal:no cyanosis of digits and no clubbing  NEURO: alert & oriented x 3 with fluent speech, no focal motor/sensory deficits Breasts: Breast inspection showed them to be symmetrical with no nipple discharge.  Incisions in the left breast and axilla have healed well.  Palpation of the breasts and axilla revealed no obvious mass that I could appreciate.   Physical Exam    LABORATORY DATA:  I have reviewed the data as listed    Latest Ref Rng & Units 07/14/2024   11:50 AM 06/13/2024    1:37 PM 03/16/2024   11:39 AM  CBC  WBC 4.0 - 10.5 K/uL 6.5  7.5  6.4   Hemoglobin 12.0 - 15.0 g/dL 85.9  84.7  85.7   Hematocrit 36.0 - 46.0 % 42.0  44.6  41.5   Platelets 150 - 400 K/uL 194  222  208         Latest Ref Rng & Units 07/11/2024    2:11 PM 06/13/2024    1:37 PM 03/16/2024   11:39 AM  CMP  Glucose 70 - 99 mg/dL 94  95  872   BUN 8 - 23 mg/dL 12  13   12    Creatinine 0.44 - 1.00 mg/dL 8.98  8.94  9.07   Sodium 135 - 145 mmol/L 139  139  138   Potassium 3.5 - 5.1 mmol/L 4.2  4.3  4.1   Chloride 98 - 111 mmol/L 103  103  105   CO2 22 - 32 mmol/L 26  29  25    Calcium  8.9 - 10.3 mg/dL 9.1  9.8  9.4   Total Protein 6.5 - 8.1 g/dL 7.1  7.7  7.3   Total Bilirubin 0.0 - 1.2 mg/dL 0.7  0.4  0.4   Alkaline Phos 38 - 126 U/L 118  113  112   AST 15 - 41 U/L 43  43  39   ALT 0 - 44 U/L 59  60  54       RADIOGRAPHIC STUDIES: I have personally reviewed the radiological images as listed and agreed with the findings in the report. No results found.    No orders of the defined types were placed in this encounter.  All questions were answered. The patient knows to call the clinic with any problems, questions or concerns. No barriers to learning was detected. The total time spent in the appointment was 25 minutes, including review of chart and various tests results, discussions about plan of care and coordination of care plan     Onita Mattock, MD 07/27/2024

## 2024-07-27 NOTE — Assessment & Plan Note (Addendum)
 cT2(vs 4)N0M0, stage IB, ER+/PR+/HER2-, G2  -Presented with a palpable right breast mass for 2 years -Diagnosed on April 01, 2024. - breast MRI showed 3.3 cm biopsy-proven malignancy over the posterior central mid to lower left breast. This mass abuts and causes anterior tethering of the adjacent pectoralis with evidence of focal adjacent pectoralis muscle invasion - She started neoadjuvant exemestane  in March 17, 2024. Due to poor tolerance she stopped in Sep 2025 - She underwent lumpectomy and a sentinel lymph node biopsy on October 23rd 2025, which showed grade 2 invasive ductal carcinoma, 3.5 cm, with negative margins.  Sentinel lymph nodes were negative.

## 2024-08-08 ENCOUNTER — Other Ambulatory Visit: Payer: Self-pay | Admitting: Family Medicine

## 2024-08-08 DIAGNOSIS — K219 Gastro-esophageal reflux disease without esophagitis: Secondary | ICD-10-CM

## 2024-08-08 NOTE — Progress Notes (Signed)
 Location of Breast Cancer: left breast  Histology per Pathology Report:    Receptor Status: ER(pos), PR (pos), Her2-neu (neg), Ki-(65%)  Did patient present with symptoms (if so, please note symptoms) or was this found on screening mammography?:  Pt presented with left breast lump   Past/Anticipated interventions by surgeon, if any:  Patient had left breast lumpectomy on 07/14/24  Past/Anticipated interventions by medical oncology, if any:  She started neoadjuvant exemestane  in March 17, 2024. Due to poor tolerance she stopped in Sep 2025  Will consider low-dose tamoxifen post-radiation, pending discussion with her daughter.   Lymphedema issues, if any:  {:18581} {t:21944}   Pain issues, if any:  {:18581} {PAIN DESCRIPTION:21022940}  Skin issues if any   SAFETY ISSUES: Prior radiation? no Pacemaker/ICD? {:18581} Possible current pregnancy? No, hysterectomy Is the patient on methotrexate? {:18581}  Current Complaints / other details:  ***    Tiffany JULIANNA Frost, LPN 88/82/7974,87:84 PM

## 2024-08-08 NOTE — Progress Notes (Signed)
   PROVIDER:  DONNICE CARLIN BURY, MD  MRN: [id number] DOB: 26-Nov-1960 DATE OF ENCOUNTER: 08/08/2024 Interval History:     This is a 63 year old female who has undergone lumpectomy and sentinel node for a low Oncotype tumor.  She has had a flareup of her fibromyalgia postoperatively and is struggling with that right now.  Her tumor was a grade two 3.5 cm invasive ductal carcinoma with negative margins.  She had 3 sentinel lymph nodes that were negative.  I did take a couple additional margins that had some focal ADH and a focal invasive ductal carcinoma as well.    Physical Examination:   Physical Exam   Incisions are healing well without any evidence of infection or seroma   Assessment and Plan:     I discussed with her that she should call her primary care physician about her fibromyalgia as it is clearly is not under control right now.  She is going to see physical therapy.  We discussed scar massage.  She is due to see radiation oncology to proceed with that.  I will see her annually.  MATTHEW CARLIN BURY, MD

## 2024-08-08 NOTE — Progress Notes (Signed)
 Radiation Oncology         [phone]) [phone] ________________________________  Name: Tiffany Reese        MRN: [id number]  Date of Service: 08/11/2024 DOB: [birth year]  RR:Fpryjzo, Heron HERO, MD  Lanny Callander, MD     REFERRING PHYSICIAN: Lanny Callander, MD   DIAGNOSIS: The encounter diagnosis was Malignant neoplasm of lower-outer quadrant of left breast of female, estrogen receptor positive (HCC).   HISTORY OF PRESENT ILLNESS: Tiffany Reese is a 63 y.o. female originally seen in the multidisciplinary breast clinic for a new diagnosis of left breast cancer. The patient was noted to have a palpable mass in the left breast with skin retraction and on 03/01/24 proceeded with diagnostic workup. Mammographically there was a spiculated mass with architectural distortion in the left breast measuring approximately 2.6 cm in size, and there was overlying skin retraction. The entire mass was not fully visualized mammographically. No breast abnormalities were noted in the right breast. By ultrasound in the 6:00 position there was a mass in the 6:00 position of the left breast measuring 3.6 cm in greatest dimension possibly involving the chest wall. No abnormalities in the axilla were appreciated. She underwent a biopsy on 03/02/24 that showed a grade 2 invasive ductal carcinoma with associated intermediate grade DCIS. The invasive tumor was ER/PR positive, HER2 negative with a Ki 67 of 65%.   Since her last visit, an Oncotype Dx score was performed on her core biopsy.  This showed a recurrence score result of 15.  She was started on neoadjuvant antiestrogen therapy.  She also had an MRI of the bilateral breasts on 03/18/2024 which measured the known area in the left breast to be 3.3 cm in greatest dimension abutting and tethering the adjacent pectoralis muscle anteriorly with enhancement along the muscle interface consistent with invasion.  No abnormal appearing axillary nodes were appreciated, but a left internal  mammary node measuring 5 mm was noted.  A repeat MRI on 06/17/2024 showed no significant change in the biopsy-proven left lower breast mass, it measured 3.4 cm in greatest dimension.  Again the mass abuts and tethers the pectoralis major.  The internal mammary chain node was not mentioned, but in the nodes section of the report it states no abnormal appearing lymph nodes.  No new findings were otherwise appreciated.  She also underwent genetic testing since her last visit and this showed no clinically significant variants detectable.  She struggled with tolerating antiestrogen per report, and she stoppsed this after she was counseled on the rationale to proceed with surgical intervention and on 07/14/2024 underwent a left lumpectomy with sentinel lymph node biopsy.  This showed a grade 2 invasive ductal carcinoma measuring 3.5 cm with associated intermediate grade DCIS.  Her margins were negative for invasive disease the closest being 2 mm to the anterior surface, and negative for DCIS 8 mm to the anterior surface with a separate medial excision and focal atypical ductal hyperplasia was noted at the left superior margin focally but the margin was negative for ADH.  At the time, 3 sentinel lymph nodes were removed, none of them contained any evidence of disease.  She is seen to consider adjuvant treatment with radiotherapy.     PREVIOUS RADIATION THERAPY: No   PAST MEDICAL HISTORY:  Past Medical History:  Diagnosis Date   Anemia    Anxiety    Arthritis    Colitis 2019   Colon polyps    Depression, major    Diabetes mellitus  type II, controlled (HCC)    Family history of colon cancer    Family history of prostate cancer    Fibromyalgia    Frequent headaches    used to have severe migraines   GERD (gastroesophageal reflux disease)    Hepatic steatosis    Hypertension    Hypothyroid    Obesity    PTSD (post-traumatic stress disorder)        PAST SURGICAL HISTORY: Past Surgical History:   Procedure Laterality Date   ANKLE SURGERY Right    fracture repair   BREAST BIOPSY Left 03/02/2024   US  LT BREAST BX W LOC DEV 1ST LESION IMG BX SPEC US  GUIDE 03/02/2024 [facility]   BREAST BIOPSY Left 07/12/2024   US  LT RADIOACTIVE SEED LOC 07/12/2024 [facility]   BREAST LUMPECTOMY WITH RADIOACTIVE SEED AND SENTINEL LYMPH NODE BIOPSY Left 07/14/2024   Procedure: BREAST LUMPECTOMY WITH RADIOACTIVE SEED AND SENTINEL LYMPH NODE BIOPSY;  Surgeon: Ebbie Cough, MD;  Location: [facility] OR;  Service: General;  Laterality: Left;   COLONOSCOPY  2015   KIDNEY SURGERY     stenting   ORIF ANKLE FRACTURE Left 11/05/2018   Procedure: OPEN REDUCTION INTERNAL FIXATION (ORIF) LEFT TRIMALLEOLAR ANKLE FRACTURE WITH OR WITHOUT FIXATION OF POSTERIOR LIP, XR 3V LEFT ANKLE;  Surgeon: Beverley Evalene BIRCH, MD;  Location: [facility] [place];  Service: Orthopedics;  Laterality: Left;   VAGINAL HYSTERECTOMY  2015   ovaries remain, benign findings, was done for abnormal pap's     FAMILY HISTORY:  Family History  Problem Relation Age of Onset   COPD Mother 2   Alcohol abuse Mother    CAD Father    Colon cancer Father 19   Ankylosing spondylitis Sister    Drug abuse Sister    Alcohol abuse Sister    Prostate cancer Maternal Uncle    Other Maternal Grandmother        lived to be over 100   Lung cancer Maternal Grandfather    Aneurysm Paternal Grandmother        brain     SOCIAL HISTORY:  reports that she has never smoked. She has never used smokeless tobacco. She reports that she does not currently use alcohol. She reports current drug use. Drug: Marijuana.  She lives in [place] and is divorced.    ALLERGIES: Bupropion, Duloxetine, Sulfa antibiotics, Metformin and related, Paxil [paroxetine hcl], Prozac [fluoxetine hcl], Trintellix [vortioxetine], Tylenol  [acetaminophen ], and Zoloft [sertraline hcl]   MEDICATIONS:  Current Outpatient Medications  Medication Sig Dispense Refill    ACCU-CHEK AVIVA PLUS test strip USE AS DIRECTED UP TO 4 TIMES A DAY 100 strip 1   albuterol  (VENTOLIN  HFA) 108 (90 Base) MCG/ACT inhaler TAKE 2 PUFFS BY MOUTH EVERY 6 HOURS AS NEEDED FOR WHEEZE OR SHORTNESS OF BREATH 8.5 each 2   amitriptyline  (ELAVIL ) 75 MG tablet Take 1 tablet (75 mg total) by mouth at bedtime. 30 tablet 2   Blood Pressure Monitoring (BLOOD PRESSURE MONITOR/L CUFF) MISC 1 Device by Does not apply route daily. 1 each 0   cetirizine  (ZYRTEC ) 10 MG tablet TAKE 1 TABLET BY MOUTH EVERY DAY (Patient taking differently: Take 10 mg by mouth in the morning.) 90 tablet 1   diazepam  (VALIUM ) 2 MG tablet Take two to three tab daily as needed for anxiety 90 tablet 2   diclofenac  (VOLTAREN ) 75 MG EC tablet TAKE 1 TABLET TWICE A DAY AS NEEDED FOR MODERATE PAIN (Patient taking differently: Take 75 mg by  mouth 2 (two) times daily.) 180 tablet 1   exemestane  (AROMASIN ) 25 MG tablet TAKE 1 TABLET (25 MG TOTAL) BY MOUTH DAILY AFTER BREAKFAST. 30 tablet 2   furosemide  (LASIX ) 20 MG tablet TAKE 1 TABLET BY MOUTH EVERY DAY 90 tablet 1   levothyroxine  (SYNTHROID ) 50 MCG tablet TAKE 1 TABLET BY MOUTH EVERY DAY 90 tablet 1   lurasidone  (LATUDA ) 40 MG TABS tablet Take 1 tablet (40 mg total) by mouth daily with breakfast. 30 tablet 2   Menthol, Topical Analgesic, (BENGAY EX) Apply 1 application  topically as needed (elbow/wrist/back/hip/knee pain.).     methocarbamol  (ROBAXIN ) 750 MG tablet TAKE 1 TABLET (750 MG TOTAL) BY MOUTH EVERY 8 (EIGHT) HOURS AS NEEDED FOR MUSCLE SPASMS. (Patient taking differently: Take 750 mg by mouth in the morning and at bedtime.) 90 tablet 1   Multiple Vitamin (MULTIVITAMIN WITH MINERALS) TABS tablet Take 1 tablet by mouth daily. Women's 50+     omeprazole  (PRILOSEC) 40 MG capsule TAKE 1 CAPSULE (40 MG TOTAL) BY MOUTH DAILY. 90 capsule 1   oxyCODONE  (OXY IR/ROXICODONE ) 5 MG immediate release tablet Take 1 tablet (5 mg total) by mouth every 6 (six) hours as needed. 10 tablet 0    pregabalin  (LYRICA ) 100 MG capsule Take 100 mg by mouth 2 (two) times daily.     Semaglutide ,0.25 or 0.5MG /DOS, (OZEMPIC , 0.25 OR 0.5 MG/DOSE,) 2 MG/3ML SOPN INJECT 0.5 MG INTO THE SKIN ONE TIME PER WEEK (Patient taking differently: Inject 0.5 mg into the skin every Sunday.) 9 mL 0   traMADol  (ULTRAM ) 50 MG tablet Take 1 tablet (50 mg total) by mouth every 8 (eight) hours as needed for severe pain (pain score 7-10). 20 tablet 0   No current facility-administered medications for this encounter.     REVIEW OF SYSTEMS: On review of systems, the patient reports that she is doing well since her surgery. She reports she is feeling pretty well without breast specific complaints.     PHYSICAL EXAM:  Wt Readings from Last 3 Encounters:  08/11/24 240 lb 3.2 oz (109 kg)  07/27/24 240 lb (108.9 kg)  07/14/24 240 lb (108.9 kg)   Temp Readings from Last 3 Encounters:  08/11/24 97.6 F (36.4 C)  07/27/24 (!) 97.5 F (36.4 C)  07/14/24 98.1 F (36.7 C)   BP Readings from Last 3 Encounters:  08/11/24 127/83  07/27/24 130/80  07/14/24 115/84   Pulse Readings from Last 3 Encounters:  08/11/24 78  07/27/24 79  07/14/24 71    In general this is a well appearing caucasian female in no acute distress. She's alert and oriented x4 and appropriate throughout the examination. Cardiopulmonary assessment is negative for acute distress and she exhibits normal effort.  The left breast reveals a well-healed surgical incision site, the axilla neither area have evidence of erythema, separation, or drainage.   ECOG = 0  0 - Asymptomatic (Fully active, able to carry on all predisease activities without restriction)  1 - Symptomatic but completely ambulatory (Restricted in physically strenuous activity but ambulatory and able to carry out work of a light or sedentary nature. For example, light housework, office work)  2 - Symptomatic, <50% in bed during the day (Ambulatory and capable of all self care but  unable to carry out any work activities. Up and about more than 50% of waking hours)  3 - Symptomatic, >50% in bed, but not bedbound (Capable of only limited self-care, confined to bed or chair 50% or more  of waking hours)  4 - Bedbound (Completely disabled. Cannot carry on any self-care. Totally confined to bed or chair)  5 - Death   Raylene MM, Creech RH, Tormey DC, et al. [phone]). Toxicity and response criteria of the [facility]. Am. DOROTHA Bridges. Oncol. 5 (6): 649-55    LABORATORY DATA:  Lab Results  Component Value Date   WBC 6.5 07/14/2024   HGB 14.0 07/14/2024   HCT 42.0 07/14/2024   MCV 92.3 07/14/2024   PLT 194 07/14/2024   Lab Results  Component Value Date   NA 139 07/11/2024   K 4.2 07/11/2024   CL 103 07/11/2024   CO2 26 07/11/2024   Lab Results  Component Value Date   ALT 59 (H) 07/11/2024   AST 43 (H) 07/11/2024   ALKPHOS 118 07/11/2024   BILITOT 0.7 07/11/2024      RADIOGRAPHY: No results found.      IMPRESSION/PLAN: 1. Stage IB, pT2N0M0, grade 2 ER/PR positive invasive ductal carcinoma with associated intermediate grade DCIS of the left breast. Dr. Dewey has reviewed her final pathology results.  Today we discussed the nature of early-stage breast cancer, and the rationale for external radiotherapy to the breast  to reduce risks of local recurrence. Dr. Lanny will follow her after completing treatment and the patient intends discuss her options since she feels she didn't tolerate prior antiestrogen therapy.  We discussed the risks, benefits, short, and long term effects of radiotherapy, as well as the curative intent, and the patient is interested in proceeding.  I reviewed the delivery and logistics of radiotherapy and that Dr. Dewey recommends 4  weeks of radiotherapy to the left breast with deep inspiration breath hold technique. Written consent is obtained and placed in the chart, a copy was provided to the patient.  She will simulate  tomorrow.   In a visit lasting 45 minutes, greater than 50% of the time was spent face to face reviewing her case, as well as in preparation of, discussing, and coordinating the patient's care.      Donald KYM Husband, [facility]    **Disclaimer: This note was dictated with voice recognition software. Similar sounding words can inadvertently be transcribed and this note may contain transcription errors which may not have been corrected upon publication of note.**

## 2024-08-11 ENCOUNTER — Ambulatory Visit
Admission: RE | Admit: 2024-08-11 | Discharge: 2024-08-11 | Disposition: A | Discharge status: Home / Self Care | Source: Ambulatory Visit | Attending: Radiation Oncology | Admitting: Radiation Oncology

## 2024-08-11 VITALS — BP 127/83 | HR 78 | Temp 97.6°F | Resp 20 | Ht 67.0 in | Wt 240.2 lb

## 2024-08-11 DIAGNOSIS — Z170 Estrogen receptor positive status [ER+]: Secondary | ICD-10-CM

## 2024-08-11 DIAGNOSIS — K219 Gastro-esophageal reflux disease without esophagitis: Secondary | ICD-10-CM | POA: Diagnosis not present

## 2024-08-11 DIAGNOSIS — K760 Fatty (change of) liver, not elsewhere classified: Secondary | ICD-10-CM | POA: Diagnosis not present

## 2024-08-11 DIAGNOSIS — C50512 Malignant neoplasm of lower-outer quadrant of left female breast: Secondary | ICD-10-CM | POA: Diagnosis present

## 2024-08-11 DIAGNOSIS — Z800 Family history of malignant neoplasm of digestive organs: Secondary | ICD-10-CM | POA: Insufficient documentation

## 2024-08-11 DIAGNOSIS — M797 Fibromyalgia: Secondary | ICD-10-CM | POA: Diagnosis not present

## 2024-08-11 DIAGNOSIS — I10 Essential (primary) hypertension: Secondary | ICD-10-CM | POA: Diagnosis not present

## 2024-08-11 DIAGNOSIS — Z8042 Family history of malignant neoplasm of prostate: Secondary | ICD-10-CM | POA: Insufficient documentation

## 2024-08-11 DIAGNOSIS — Z860101 Personal history of adenomatous and serrated colon polyps: Secondary | ICD-10-CM | POA: Insufficient documentation

## 2024-08-11 DIAGNOSIS — E039 Hypothyroidism, unspecified: Secondary | ICD-10-CM | POA: Diagnosis not present

## 2024-08-11 DIAGNOSIS — Z79899 Other long term (current) drug therapy: Secondary | ICD-10-CM | POA: Diagnosis not present

## 2024-08-11 DIAGNOSIS — Z79811 Long term (current) use of aromatase inhibitors: Secondary | ICD-10-CM | POA: Insufficient documentation

## 2024-08-11 DIAGNOSIS — Z7985 Long-term (current) use of injectable non-insulin antidiabetic drugs: Secondary | ICD-10-CM | POA: Diagnosis not present

## 2024-08-11 DIAGNOSIS — Z801 Family history of malignant neoplasm of trachea, bronchus and lung: Secondary | ICD-10-CM | POA: Insufficient documentation

## 2024-08-11 DIAGNOSIS — M129 Arthropathy, unspecified: Secondary | ICD-10-CM | POA: Insufficient documentation

## 2024-08-11 DIAGNOSIS — E119 Type 2 diabetes mellitus without complications: Secondary | ICD-10-CM | POA: Diagnosis not present

## 2024-08-12 ENCOUNTER — Ambulatory Visit
Admission: RE | Admit: 2024-08-12 | Discharge: 2024-08-12 | Disposition: A | Discharge status: Home / Self Care | Source: Ambulatory Visit | Attending: Radiation Oncology | Admitting: Radiation Oncology

## 2024-08-12 DIAGNOSIS — Z510 Encounter for antineoplastic radiation therapy: Secondary | ICD-10-CM | POA: Insufficient documentation

## 2024-08-12 DIAGNOSIS — C50412 Malignant neoplasm of upper-outer quadrant of left female breast: Secondary | ICD-10-CM | POA: Diagnosis present

## 2024-08-12 DIAGNOSIS — Z170 Estrogen receptor positive status [ER+]: Secondary | ICD-10-CM | POA: Insufficient documentation

## 2024-08-13 ENCOUNTER — Other Ambulatory Visit (HOSPITAL_COMMUNITY): Payer: Self-pay | Admitting: Psychiatry

## 2024-08-13 DIAGNOSIS — F419 Anxiety disorder, unspecified: Secondary | ICD-10-CM

## 2024-08-13 DIAGNOSIS — F319 Bipolar disorder, unspecified: Secondary | ICD-10-CM

## 2024-08-13 DIAGNOSIS — F4310 Post-traumatic stress disorder, unspecified: Secondary | ICD-10-CM

## 2024-08-16 ENCOUNTER — Encounter: Payer: Self-pay | Admitting: Family Medicine

## 2024-08-16 ENCOUNTER — Ambulatory Visit: Admitting: Family Medicine

## 2024-08-16 VITALS — BP 100/70 | HR 70 | Temp 97.9°F | Ht 67.0 in | Wt 236.5 lb

## 2024-08-16 DIAGNOSIS — E118 Type 2 diabetes mellitus with unspecified complications: Secondary | ICD-10-CM | POA: Diagnosis not present

## 2024-08-16 DIAGNOSIS — L309 Dermatitis, unspecified: Secondary | ICD-10-CM | POA: Diagnosis not present

## 2024-08-16 DIAGNOSIS — E039 Hypothyroidism, unspecified: Secondary | ICD-10-CM

## 2024-08-16 DIAGNOSIS — E1169 Type 2 diabetes mellitus with other specified complication: Secondary | ICD-10-CM

## 2024-08-16 DIAGNOSIS — Z780 Asymptomatic menopausal state: Secondary | ICD-10-CM

## 2024-08-16 DIAGNOSIS — Z7985 Long-term (current) use of injectable non-insulin antidiabetic drugs: Secondary | ICD-10-CM

## 2024-08-16 DIAGNOSIS — Z6841 Body Mass Index (BMI) 40.0 and over, adult: Secondary | ICD-10-CM | POA: Diagnosis not present

## 2024-08-16 DIAGNOSIS — R062 Wheezing: Secondary | ICD-10-CM

## 2024-08-16 DIAGNOSIS — E785 Hyperlipidemia, unspecified: Secondary | ICD-10-CM

## 2024-08-16 LAB — POCT GLYCOSYLATED HEMOGLOBIN (HGB A1C): Hemoglobin A1C: 5.5 % (ref 4.0–5.6)

## 2024-08-16 MED ORDER — ALBUTEROL SULFATE HFA 108 (90 BASE) MCG/ACT IN AERS: 2.0000 | INHALATION_SPRAY | RESPIRATORY_TRACT | 2 refills | Status: AC | PRN

## 2024-08-16 MED ORDER — OZEMPIC (0.25 OR 0.5 MG/DOSE) 2 MG/3ML ~~LOC~~ SOPN: 0.5000 mg | PEN_INJECTOR | SUBCUTANEOUS | 3 refills | Status: AC

## 2024-08-16 MED ORDER — TRIAMCINOLONE ACETONIDE 0.1 % EX CREA
1.0000 | TOPICAL_CREAM | Freq: Two times a day (BID) | CUTANEOUS | 0 refills | Status: AC
Start: 2024-08-16 — End: ?

## 2024-08-16 MED ORDER — LEVOTHYROXINE SODIUM 50 MCG PO TABS: 50.0000 ug | ORAL_TABLET | Freq: Every day | ORAL | 1 refills | Status: AC

## 2024-08-16 NOTE — Progress Notes (Signed)
 Established Patient Office Visit  Subjective   Patient ID: Tiffany Reese, female    DOB: 02/21/1961  Age: 63 y.o. MRN: [id number]  Chief Complaint  Patient presents with   Medical Management of Chronic Issues   Cough    Recurrent productive with clear sputum x6 months    Cough  Discussed the use of AI scribe software for clinical note transcription with the patient, who gave verbal consent to proceed.  History of Present Illness   Tiffany Reese is a 63 year old female who presents for a follow-up visit post-lumpectomy.  She underwent lumpectomy on October 23 and has significant post-procedural pain despite a compression vest, described as sharp zingers and a gnawing egg-like lump sensation. She is scheduled to start radiation therapy on December 3 and has already followed up with her surgeon and oncologist.  She has increased anxiety related to her friend's recent death and concern about her inherited dog's health. She scratches when anxious, causing itchy bumps. She uses cortisone cream with partial relief and is concerned about one persistent itchy bump.  She takes Ozempic  0.5 mg weekly for diabetes. Her recent A1c was 5.5. She missed one dose prior to surgery.  She has had a residual productive cough since August after bronchitis, with mostly clear sputum in the morning and occasional discoloration she thinks may be from tea or coffee. She has sneezing and postnasal drip, and uses inhalers for wheezing that persists on the right. She does not smoke.  She has bunions and altered gait after a prior ankle fracture, which she feels contributes to back pain due to twisting and pushing off with walking.  She is preparing to spend Thanksgiving with friends and neighbors while her daughter is away.        Current Outpatient Medications  Medication Instructions   ACCU-CHEK AVIVA PLUS test strip USE AS DIRECTED UP TO 4 TIMES A DAY   albuterol  (VENTOLIN  HFA) 108 (90 Base)  MCG/ACT inhaler 2 puffs, Inhalation, Every 4 hours PRN   amitriptyline  (ELAVIL ) 75 mg, Oral, Daily at bedtime   Blood Pressure Monitoring (BLOOD PRESSURE MONITOR/L CUFF) MISC 1 Device, Does not apply, Daily   cetirizine  (ZYRTEC ) 10 mg, Oral, Daily   diazepam  (VALIUM ) 2 MG tablet Take two to three tab daily as needed for anxiety   diclofenac  (VOLTAREN ) 75 MG EC tablet TAKE 1 TABLET TWICE A DAY AS NEEDED FOR MODERATE PAIN   furosemide  (LASIX ) 20 mg, Oral, Daily   levothyroxine  (SYNTHROID ) 50 mcg, Oral, Daily   lurasidone  (LATUDA ) 40 mg, Oral, Daily with breakfast   Menthol, Topical Analgesic, (BENGAY EX) 1 application , As needed   methocarbamol  (ROBAXIN ) 750 mg, Oral, Every 8 hours PRN   Multiple Vitamin (MULTIVITAMIN WITH MINERALS) TABS tablet 1 tablet, Daily   omeprazole  (PRILOSEC) 40 mg, Oral, Daily   oxyCODONE  (OXY IR/ROXICODONE ) 5 mg, Oral, Every 6 hours PRN   Ozempic  (0.25 or 0.5 MG/DOSE) 0.5 mg, Injection, Weekly, INJECT 0.5 MG INTO THE SKIN ONE TIME PER WEEK   pregabalin  (LYRICA ) 100 mg, 2 times daily   triamcinolone  cream (KENALOG ) 0.1 % 1 Application, Topical, 2 times daily    Patient Active Problem List   Diagnosis Date Noted   Hyperlipidemia associated with type 2 diabetes mellitus (HCC) 08/16/2024   Genetic testing 03/28/2024   Family history of prostate cancer    Family history of colon cancer    Malignant neoplasm of lower-outer quadrant of left breast of female, estrogen receptor  positive (HCC) 03/07/2024   Fibromyalgia 08/14/2023   Elevated d-dimer 11/16/2018   At risk for adverse drug reaction 11/10/2018   GERD (gastroesophageal reflux disease)    Hypertension    Diabetes mellitus type II, controlled (HCC)    PTSD (post-traumatic stress disorder) 11/01/2018   Anxiety 11/01/2018   Depression, recurrent 11/01/2018   Closed left ankle fracture 11/01/2018   Anemia 11/01/2018   Hypothyroid 11/01/2018   Fatty liver disease, nonalcoholic 09/01/2018   BMI 45.0-49.9,  adult (HCC) 04/29/2018   DDD (degenerative disc disease), thoracolumbar 04/29/2018     Review of Systems  Respiratory:  Positive for cough.   All other systems reviewed and are negative.     Objective:     BP 100/70   Pulse 70   Temp 97.9 F (36.6 C) (Oral)   Ht 5' 7 (1.702 m)   Wt 236 lb 8 oz (107.3 kg)   SpO2 96%   BMI 37.04 kg/m    Physical Exam Vitals reviewed.  Constitutional:      Appearance: Normal appearance. She is well-groomed. She is obese.  Cardiovascular:     Rate and Rhythm: Normal rate and regular rhythm.     Heart sounds: S1 normal and S2 normal.  Pulmonary:     Effort: Pulmonary effort is normal.     Breath sounds: Normal breath sounds and air entry.  Musculoskeletal:     Right lower leg: No edema.     Left lower leg: No edema.  Neurological:     Mental Status: She is alert and oriented to person, place, and time. Mental status is at baseline.     Gait: Gait is intact.  Psychiatric:        Mood and Affect: Mood and affect normal.        Speech: Speech normal.        Behavior: Behavior normal.        Judgment: Judgment normal.      Results for orders placed or performed in visit on 08/16/24  POC HgB A1c  Result Value Ref Range   Hemoglobin A1C 5.5 4.0 - 5.6 %   HbA1c POC (<> result, manual entry)     HbA1c, POC (prediabetic range)     HbA1c, POC (controlled diabetic range)        The 10-year ASCVD risk score (Arnett DK, et al., 2019) is: 5.8%    Assessment & Plan:  Controlled type 2 diabetes mellitus with complication, without long-term current use of insulin  (HCC) -     POCT glycosylated hemoglobin (Hb A1C) -     Collection capillary blood specimen -     Microalbumin / creatinine urine ratio; Future -     Ozempic  (0.25 or 0.5 MG/DOSE); Inject 0.5 mg as directed once a week. INJECT 0.5 MG INTO THE SKIN ONE TIME PER WEEK  Dispense: 9 mL; Refill: 3  Hyperlipidemia associated with type 2 diabetes mellitus (HCC)  Last LDL was 106, pt  is not currently on a statin, however she is on GLP medication for her diabetes. Will recheck lipid panel at next visit and discuss further testing to better risk stratify the patient. Current 10 year ASCVD risk is only 5.8%.  BMI 45.0-49.9, adult (HCC) Co morbid condition associated with her diabetes and HLD.  Dermatitis -     Triamcinolone  Acetonide; Apply 1 Application topically 2 (two) times daily.  Dispense: 30 g; Refill: 0  Hypothyroidism, unspecified type -     Levothyroxine  Sodium; Take  1 tablet (50 mcg total) by mouth daily.  Dispense: 90 tablet; Refill: 1  Postmenopausal state -     DG Bone Density; Future  Wheezing -     Albuterol  Sulfate HFA; Inhale 2 puffs into the lungs every 4 (four) hours as needed for wheezing or shortness of breath.  Dispense: 8.5 each; Refill: 2 -     DG Chest 2 View; Future   Assessment and Plan    Breast cancer, post-lumpectomy, undergoing radiation therapy Post-lumpectomy status with ongoing radiation therapy. Significant post-operative pain managed with compression vest. Concerns about reliance on compression vest for pain management. Anxiety related to recent bereavement and financial stress. - Continue radiation therapy as scheduled. - Advised wearing compression vest as needed for pain management. - Encouraged follow-up with oncologist in January and surgeon in one year.  Type 2 diabetes mellitus, well-controlled Type 2 diabetes mellitus well-controlled with current regimen. Recent A1c is 5.5%. - Continue Ozempic  0.5 mg once weekly.  Obesity, BMI 45.0-49.9 Obesity with BMI 45.0-49.9.  Dermatitis, localized pruritic lesion Localized pruritic lesion likely due to dermatitis or contact dermatitis. Possible contact with allergens at the vet office. Lesion is itchy and has been present for a few days. - Prescribed steroid cream for topical application to reduce itching and inflammation.  Wheezing and residual cough, post-bronchitis Residual  cough and wheezing post-bronchitis. Cough is clear in the morning and may be related to stress. Wheezing noted on examination, particularly on the right side. No smoking history. Previous inhaler use noted. - Refilled albuterol  inhaler. - Ordered chest x-ray to evaluate lung condition. - Advised follow-up if symptoms worsen or if cough becomes productive with color.  Bunion with gait abnormality and associated back pain Bunion causing gait abnormality and associated back pain. Previous recommendation for bunion surgery by foot and ankle specialist. Concerns about post-surgical chronic pain. Discussion about potential benefits of surgery on gait and back pain. Consideration of logistics and recovery time post-surgery. - Discuss bunion surgery with back doctor, Dr. Bonner, to evaluate impact on back pain. - Will consider referral to podiatrist for re-evaluation post-breast cancer treatment.         Return in about 6 months (around 02/13/2025) for annual physical exam.    Heron CHRISTELLA Sharper, MD

## 2024-08-17 ENCOUNTER — Encounter: Payer: Self-pay | Admitting: *Deleted

## 2024-08-17 DIAGNOSIS — Z170 Estrogen receptor positive status [ER+]: Secondary | ICD-10-CM

## 2024-08-17 LAB — MICROALBUMIN / CREATININE URINE RATIO
Creatinine,U: 82 mg/dL
Microalb Creat Ratio: UNDETERMINED mg/g (ref 0.0–30.0)
Microalb, Ur: <0.7 mg/dL

## 2024-08-21 ENCOUNTER — Ambulatory Visit: Payer: Self-pay | Admitting: Family Medicine

## 2024-08-21 ENCOUNTER — Other Ambulatory Visit: Payer: Self-pay | Admitting: Family Medicine

## 2024-08-21 DIAGNOSIS — M797 Fibromyalgia: Secondary | ICD-10-CM

## 2024-08-23 ENCOUNTER — Other Ambulatory Visit

## 2024-08-23 ENCOUNTER — Inpatient Hospital Stay
Admission: RE | Admit: 2024-08-23 | Discharge: 2024-08-23 | Disposition: A | Source: Ambulatory Visit | Attending: Family Medicine

## 2024-08-23 ENCOUNTER — Ambulatory Visit

## 2024-08-23 DIAGNOSIS — C50512 Malignant neoplasm of lower-outer quadrant of left female breast: Secondary | ICD-10-CM | POA: Insufficient documentation

## 2024-08-23 DIAGNOSIS — Z170 Estrogen receptor positive status [ER+]: Secondary | ICD-10-CM | POA: Diagnosis present

## 2024-08-23 DIAGNOSIS — R062 Wheezing: Secondary | ICD-10-CM

## 2024-08-23 DIAGNOSIS — Z780 Asymptomatic menopausal state: Secondary | ICD-10-CM | POA: Diagnosis not present

## 2024-08-24 ENCOUNTER — Ambulatory Visit

## 2024-08-24 ENCOUNTER — Ambulatory Visit
Admission: RE | Admit: 2024-08-24 | Discharge: 2024-08-24 | Disposition: A | Source: Ambulatory Visit | Attending: Radiation Oncology | Admitting: Radiation Oncology

## 2024-08-24 ENCOUNTER — Other Ambulatory Visit: Payer: Self-pay

## 2024-08-24 DIAGNOSIS — C50512 Malignant neoplasm of lower-outer quadrant of left female breast: Secondary | ICD-10-CM | POA: Diagnosis not present

## 2024-08-24 LAB — RAD ONC ARIA SESSION SUMMARY
Course Elapsed Days: 0
Plan Fractions Treated to Date: 1
Plan Prescribed Dose Per Fraction: 2.66 Gy
Plan Total Fractions Prescribed: 16
Plan Total Prescribed Dose: 42.56 Gy
Reference Point Dosage Given to Date: 2.66 Gy
Reference Point Session Dosage Given: 2.66 Gy
Session Number: 1

## 2024-08-25 ENCOUNTER — Ambulatory Visit

## 2024-08-26 ENCOUNTER — Ambulatory Visit: Admission: RE | Admit: 2024-08-26 | Discharge: 2024-08-26 | Attending: Radiation Oncology

## 2024-08-26 ENCOUNTER — Other Ambulatory Visit: Payer: Self-pay

## 2024-08-26 DIAGNOSIS — C50512 Malignant neoplasm of lower-outer quadrant of left female breast: Secondary | ICD-10-CM | POA: Diagnosis not present

## 2024-08-26 LAB — RAD ONC ARIA SESSION SUMMARY
Course Elapsed Days: 2
Plan Fractions Treated to Date: 2
Plan Prescribed Dose Per Fraction: 2.66 Gy
Plan Total Fractions Prescribed: 16
Plan Total Prescribed Dose: 42.56 Gy
Reference Point Dosage Given to Date: 5.32 Gy
Reference Point Session Dosage Given: 2.66 Gy
Session Number: 2

## 2024-08-26 MED ORDER — RADIAPLEXRX EX GEL: Freq: Once | CUTANEOUS | Status: AC

## 2024-08-26 MED ORDER — ALRA NON-METALLIC DEODORANT (RAD-ONC)
1.0000 | Freq: Once | TOPICAL | Status: AC
  Administered 2024-08-26: 1 via TOPICAL

## 2024-08-29 ENCOUNTER — Telehealth (HOSPITAL_COMMUNITY): Admitting: Psychiatry

## 2024-08-29 ENCOUNTER — Other Ambulatory Visit: Payer: Self-pay

## 2024-08-29 ENCOUNTER — Ambulatory Visit
Admission: RE | Admit: 2024-08-29 | Discharge: 2024-08-29 | Disposition: A | Source: Ambulatory Visit | Attending: Radiation Oncology

## 2024-08-29 DIAGNOSIS — C50512 Malignant neoplasm of lower-outer quadrant of left female breast: Secondary | ICD-10-CM | POA: Diagnosis not present

## 2024-08-29 LAB — RAD ONC ARIA SESSION SUMMARY
Course Elapsed Days: 5
Plan Fractions Treated to Date: 3
Plan Prescribed Dose Per Fraction: 2.66 Gy
Plan Total Fractions Prescribed: 16
Plan Total Prescribed Dose: 42.56 Gy
Reference Point Dosage Given to Date: 7.98 Gy
Reference Point Session Dosage Given: 2.66 Gy
Session Number: 3

## 2024-08-30 ENCOUNTER — Ambulatory Visit: Admission: RE | Admit: 2024-08-30 | Discharge: 2024-08-30 | Attending: Radiation Oncology

## 2024-08-30 ENCOUNTER — Other Ambulatory Visit: Payer: Self-pay

## 2024-08-30 DIAGNOSIS — C50512 Malignant neoplasm of lower-outer quadrant of left female breast: Secondary | ICD-10-CM | POA: Diagnosis not present

## 2024-08-30 LAB — RAD ONC ARIA SESSION SUMMARY
Course Elapsed Days: 6
Plan Fractions Treated to Date: 4
Plan Prescribed Dose Per Fraction: 2.66 Gy
Plan Total Fractions Prescribed: 16
Plan Total Prescribed Dose: 42.56 Gy
Reference Point Dosage Given to Date: 10.64 Gy
Reference Point Session Dosage Given: 2.66 Gy
Session Number: 4

## 2024-08-31 ENCOUNTER — Other Ambulatory Visit: Payer: Self-pay

## 2024-08-31 ENCOUNTER — Ambulatory Visit
Admission: RE | Admit: 2024-08-31 | Discharge: 2024-08-31 | Disposition: A | Discharge status: Home / Self Care | Source: Ambulatory Visit | Attending: Radiation Oncology | Admitting: Radiation Oncology

## 2024-08-31 DIAGNOSIS — C50512 Malignant neoplasm of lower-outer quadrant of left female breast: Secondary | ICD-10-CM | POA: Diagnosis not present

## 2024-08-31 LAB — RAD ONC ARIA SESSION SUMMARY
Course Elapsed Days: 7
Plan Fractions Treated to Date: 5
Plan Prescribed Dose Per Fraction: 2.66 Gy
Plan Total Fractions Prescribed: 16
Plan Total Prescribed Dose: 42.56 Gy
Reference Point Dosage Given to Date: 13.3 Gy
Reference Point Session Dosage Given: 2.66 Gy
Session Number: 5

## 2024-09-01 ENCOUNTER — Other Ambulatory Visit: Payer: Self-pay

## 2024-09-01 ENCOUNTER — Ambulatory Visit
Admission: RE | Admit: 2024-09-01 | Discharge: 2024-09-01 | Disposition: A | Discharge status: Home / Self Care | Source: Ambulatory Visit | Attending: Radiation Oncology | Admitting: Radiation Oncology

## 2024-09-01 ENCOUNTER — Encounter (HOSPITAL_COMMUNITY): Payer: Self-pay | Admitting: Psychiatry

## 2024-09-01 ENCOUNTER — Ambulatory Visit (HOSPITAL_COMMUNITY): Admitting: Psychiatry

## 2024-09-01 DIAGNOSIS — F419 Anxiety disorder, unspecified: Secondary | ICD-10-CM | POA: Diagnosis not present

## 2024-09-01 DIAGNOSIS — F4310 Post-traumatic stress disorder, unspecified: Secondary | ICD-10-CM

## 2024-09-01 DIAGNOSIS — F319 Bipolar disorder, unspecified: Secondary | ICD-10-CM

## 2024-09-01 DIAGNOSIS — C50512 Malignant neoplasm of lower-outer quadrant of left female breast: Secondary | ICD-10-CM | POA: Diagnosis not present

## 2024-09-01 LAB — RAD ONC ARIA SESSION SUMMARY
Course Elapsed Days: 8
Plan Fractions Treated to Date: 6
Plan Prescribed Dose Per Fraction: 2.66 Gy
Plan Total Fractions Prescribed: 16
Plan Total Prescribed Dose: 42.56 Gy
Reference Point Dosage Given to Date: 15.96 Gy
Reference Point Session Dosage Given: 2.66 Gy
Session Number: 6

## 2024-09-01 MED ORDER — LURASIDONE HCL 40 MG PO TABS: 40.0000 mg | ORAL_TABLET | Freq: Every day | ORAL | 2 refills | Status: AC

## 2024-09-01 MED ORDER — AMITRIPTYLINE HCL 100 MG PO TABS: 100.0000 mg | ORAL_TABLET | Freq: Every day | ORAL | 2 refills | Status: AC

## 2024-09-01 MED ORDER — DIAZEPAM 2 MG PO TABS: ORAL_TABLET | ORAL | 2 refills | Status: AC
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
## 2024-09-02 ENCOUNTER — Other Ambulatory Visit: Payer: Self-pay

## 2024-09-02 ENCOUNTER — Ambulatory Visit
Admission: RE | Admit: 2024-09-02 | Discharge: 2024-09-02 | Disposition: A | Source: Ambulatory Visit | Attending: Radiation Oncology

## 2024-09-02 DIAGNOSIS — C50512 Malignant neoplasm of lower-outer quadrant of left female breast: Secondary | ICD-10-CM | POA: Diagnosis not present

## 2024-09-02 LAB — RAD ONC ARIA SESSION SUMMARY
Course Elapsed Days: 9
Plan Fractions Treated to Date: 7
Plan Prescribed Dose Per Fraction: 2.66 Gy
Plan Total Fractions Prescribed: 16
Plan Total Prescribed Dose: 42.56 Gy
Reference Point Dosage Given to Date: 18.62 Gy
Reference Point Session Dosage Given: 2.66 Gy
Session Number: 7

## 2024-09-05 ENCOUNTER — Other Ambulatory Visit: Payer: Self-pay

## 2024-09-05 ENCOUNTER — Ambulatory Visit
Admission: RE | Admit: 2024-09-05 | Discharge: 2024-09-05 | Disposition: A | Source: Ambulatory Visit | Attending: Radiation Oncology

## 2024-09-05 DIAGNOSIS — C50512 Malignant neoplasm of lower-outer quadrant of left female breast: Secondary | ICD-10-CM | POA: Diagnosis not present

## 2024-09-05 LAB — RAD ONC ARIA SESSION SUMMARY
Course Elapsed Days: 12
Plan Fractions Treated to Date: 8
Plan Prescribed Dose Per Fraction: 2.66 Gy
Plan Total Fractions Prescribed: 16
Plan Total Prescribed Dose: 42.56 Gy
Reference Point Dosage Given to Date: 21.28 Gy
Reference Point Session Dosage Given: 2.66 Gy
Session Number: 8

## 2024-09-06 ENCOUNTER — Ambulatory Visit
Admission: RE | Admit: 2024-09-06 | Discharge: 2024-09-06 | Disposition: A | Source: Ambulatory Visit | Attending: Radiation Oncology

## 2024-09-06 ENCOUNTER — Other Ambulatory Visit: Payer: Self-pay

## 2024-09-06 ENCOUNTER — Other Ambulatory Visit (HOSPITAL_COMMUNITY): Payer: Self-pay | Admitting: Psychiatry

## 2024-09-06 DIAGNOSIS — F419 Anxiety disorder, unspecified: Secondary | ICD-10-CM

## 2024-09-06 DIAGNOSIS — F4310 Post-traumatic stress disorder, unspecified: Secondary | ICD-10-CM

## 2024-09-06 DIAGNOSIS — C50512 Malignant neoplasm of lower-outer quadrant of left female breast: Secondary | ICD-10-CM | POA: Diagnosis not present

## 2024-09-06 LAB — RAD ONC ARIA SESSION SUMMARY
Course Elapsed Days: 13
Plan Fractions Treated to Date: 9
Plan Prescribed Dose Per Fraction: 2.66 Gy
Plan Total Fractions Prescribed: 16
Plan Total Prescribed Dose: 42.56 Gy
Reference Point Dosage Given to Date: 23.94 Gy
Reference Point Session Dosage Given: 2.66 Gy
Session Number: 9

## 2024-09-07 ENCOUNTER — Ambulatory Visit: Admission: RE | Admit: 2024-09-07 | Discharge: 2024-09-07 | Attending: Radiation Oncology

## 2024-09-07 ENCOUNTER — Other Ambulatory Visit: Payer: Self-pay

## 2024-09-07 DIAGNOSIS — C50512 Malignant neoplasm of lower-outer quadrant of left female breast: Secondary | ICD-10-CM | POA: Diagnosis not present

## 2024-09-07 LAB — RAD ONC ARIA SESSION SUMMARY
Course Elapsed Days: 14
Plan Fractions Treated to Date: 10
Plan Prescribed Dose Per Fraction: 2.66 Gy
Plan Total Fractions Prescribed: 16
Plan Total Prescribed Dose: 42.56 Gy
Reference Point Dosage Given to Date: 26.6 Gy
Reference Point Session Dosage Given: 2.66 Gy
Session Number: 10

## 2024-09-08 ENCOUNTER — Other Ambulatory Visit: Payer: Self-pay | Admitting: Family Medicine

## 2024-09-08 ENCOUNTER — Ambulatory Visit

## 2024-09-08 ENCOUNTER — Ambulatory Visit: Admission: RE | Admit: 2024-09-08

## 2024-09-08 ENCOUNTER — Other Ambulatory Visit: Payer: Self-pay

## 2024-09-08 DIAGNOSIS — C50512 Malignant neoplasm of lower-outer quadrant of left female breast: Secondary | ICD-10-CM | POA: Diagnosis not present

## 2024-09-08 DIAGNOSIS — M797 Fibromyalgia: Secondary | ICD-10-CM

## 2024-09-08 LAB — RAD ONC ARIA SESSION SUMMARY
Course Elapsed Days: 15
Plan Fractions Treated to Date: 11
Plan Prescribed Dose Per Fraction: 2.66 Gy
Plan Total Fractions Prescribed: 16
Plan Total Prescribed Dose: 42.56 Gy
Reference Point Dosage Given to Date: 29.26 Gy
Reference Point Session Dosage Given: 2.66 Gy
Session Number: 11

## 2024-09-08 NOTE — Telephone Encounter (Signed)
 Copied from CRM #[id number]. Topic: Clinical - Medication Refill >> Sep 08, 2024 12:11 PM Alfonso ORN wrote: Medication:  pregabalin  (LYRICA ) 100 MG capsule  Has the patient contacted their pharmacy? Yes   This is the patient's preferred pharmacy:  [id number] [place], [place] - [address] [place] [address] Phone: [phone] Fax: [phone]  Is this the correct pharmacy for this prescription? Yes   Has the prescription been filled recently? No  Is the patient out of the medication? No  Has the patient been seen for an appointment in the last year OR does the patient have an upcoming appointment? Yes  Can we respond through MyChart? No

## 2024-09-09 ENCOUNTER — Ambulatory Visit

## 2024-09-09 ENCOUNTER — Ambulatory Visit: Admission: RE | Admit: 2024-09-09 | Discharge: 2024-09-09 | Attending: Radiation Oncology

## 2024-09-09 ENCOUNTER — Other Ambulatory Visit: Payer: Self-pay

## 2024-09-09 DIAGNOSIS — C50512 Malignant neoplasm of lower-outer quadrant of left female breast: Secondary | ICD-10-CM | POA: Diagnosis not present

## 2024-09-09 LAB — RAD ONC ARIA SESSION SUMMARY
Course Elapsed Days: 16
Plan Fractions Treated to Date: 12
Plan Prescribed Dose Per Fraction: 2.66 Gy
Plan Total Fractions Prescribed: 16
Plan Total Prescribed Dose: 42.56 Gy
Reference Point Dosage Given to Date: 31.92 Gy
Reference Point Session Dosage Given: 2.66 Gy
Session Number: 12

## 2024-09-11 ENCOUNTER — Ambulatory Visit (HOSPITAL_COMMUNITY)
Admission: EM | Admit: 2024-09-11 | Discharge: 2024-09-11 | Disposition: A | Discharge status: Home / Self Care | Attending: Emergency Medicine | Admitting: Emergency Medicine

## 2024-09-11 ENCOUNTER — Other Ambulatory Visit: Payer: Self-pay

## 2024-09-11 ENCOUNTER — Encounter (HOSPITAL_COMMUNITY): Payer: Self-pay | Admitting: Emergency Medicine

## 2024-09-11 ENCOUNTER — Emergency Department (HOSPITAL_COMMUNITY)
Admission: EM | Admit: 2024-09-11 | Discharge: 2024-09-12 | Disposition: A | Attending: Emergency Medicine | Admitting: Emergency Medicine

## 2024-09-11 DIAGNOSIS — W57XXXA Bitten or stung by nonvenomous insect and other nonvenomous arthropods, initial encounter: Secondary | ICD-10-CM | POA: Diagnosis not present

## 2024-09-11 DIAGNOSIS — E039 Hypothyroidism, unspecified: Secondary | ICD-10-CM | POA: Diagnosis not present

## 2024-09-11 DIAGNOSIS — E119 Type 2 diabetes mellitus without complications: Secondary | ICD-10-CM | POA: Insufficient documentation

## 2024-09-11 DIAGNOSIS — Z79899 Other long term (current) drug therapy: Secondary | ICD-10-CM | POA: Insufficient documentation

## 2024-09-11 DIAGNOSIS — S60562A Insect bite (nonvenomous) of left hand, initial encounter: Secondary | ICD-10-CM | POA: Insufficient documentation

## 2024-09-11 DIAGNOSIS — M7989 Other specified soft tissue disorders: Secondary | ICD-10-CM

## 2024-09-11 DIAGNOSIS — Z853 Personal history of malignant neoplasm of breast: Secondary | ICD-10-CM | POA: Diagnosis not present

## 2024-09-11 DIAGNOSIS — D696 Thrombocytopenia, unspecified: Secondary | ICD-10-CM | POA: Diagnosis not present

## 2024-09-11 DIAGNOSIS — L03114 Cellulitis of left upper limb: Secondary | ICD-10-CM

## 2024-09-11 DIAGNOSIS — S6992XA Unspecified injury of left wrist, hand and finger(s), initial encounter: Secondary | ICD-10-CM | POA: Diagnosis present

## 2024-09-11 DIAGNOSIS — I10 Essential (primary) hypertension: Secondary | ICD-10-CM | POA: Insufficient documentation

## 2024-09-11 MED ORDER — DIPHENHYDRAMINE HCL 25 MG PO TABS: 25.0000 mg | ORAL_TABLET | Freq: Four times a day (QID) | ORAL | 0 refills | Status: DC | PRN

## 2024-09-11 MED ORDER — AMOXICILLIN-POT CLAVULANATE 875-125 MG PO TABS: 1.0000 | ORAL_TABLET | Freq: Two times a day (BID) | ORAL | 0 refills | Status: DC

## 2024-09-11 NOTE — ED Triage Notes (Signed)
 Patient c/o spider bite x 1 day. Patient report increase pain and swelling on her right hand and forearm. Patient was seen at [facility] tonight  and was prescribed antibiotic. Hx Breast Ca.
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
## 2024-09-11 NOTE — Discharge Instructions (Addendum)
 Take the antibiotics twice daily with food to help prevent stomach upset  Benadryl  as needed for breakthrough itching  Swelling, pain and redness should improve over the next 2-3 days on antibiotics   If no improvement or if fever develops, seek immediate care at the nearest emergency department

## 2024-09-11 NOTE — ED Provider Notes (Signed)
 " [facility]    CSN: [id number] Arrival date & time: 09/11/24  1711      History   Chief Complaint Chief Complaint  Patient presents with   Insect Bite    HPI Tiffany Reese is a 63 y.o. female.   Patient presents to clinic over concern of spider bite to the back of her left hand that happened yesterday  Patient did not see any spiders or any bugs bite her  She had some itching in the hand yesterday, itched at this throughout the day  Woke up and her left hand was swollen, warm and painful with redness extending up to her forearm  She is a breast cancer patient and currently halfway through her radiation therapy  Takes cetrizine daily, cannot take IBU or Tylenol  per patient  Tried topical steroid cream w/o relief  Has not had fevers     The history is provided by the patient and medical records.    Past Medical History:  Diagnosis Date   Anemia    Anxiety    Arthritis    Colitis 2019   Colon polyps    Depression, major    Diabetes mellitus type II, controlled (HCC)    Family history of colon cancer    Family history of prostate cancer    Fibromyalgia    Frequent headaches    used to have severe migraines   GERD (gastroesophageal reflux disease)    Hepatic steatosis    Hypertension    Hypothyroid    Obesity    PTSD (post-traumatic stress disorder)     Patient Active Problem List   Diagnosis Date Noted   Hyperlipidemia associated with type 2 diabetes mellitus (HCC) 08/16/2024   Genetic testing 03/28/2024   Family history of prostate cancer    Family history of colon cancer    Malignant neoplasm of lower-outer quadrant of left breast of female, estrogen receptor positive (HCC) 03/07/2024   Fibromyalgia 08/14/2023   Elevated d-dimer 11/16/2018   At risk for adverse drug reaction 11/10/2018   GERD (gastroesophageal reflux disease)    Hypertension    Diabetes mellitus type II, controlled (HCC)    PTSD (post-traumatic stress disorder)  11/01/2018   Anxiety 11/01/2018   Depression, recurrent 11/01/2018   Closed left ankle fracture 11/01/2018   Anemia 11/01/2018   Hypothyroid 11/01/2018   Fatty liver disease, nonalcoholic 09/01/2018   BMI 45.0-49.9, adult (HCC) 04/29/2018   DDD (degenerative disc disease), thoracolumbar 04/29/2018    Past Surgical History:  Procedure Laterality Date   ANKLE SURGERY Right    fracture repair   BREAST BIOPSY Left 03/02/2024   US  LT BREAST BX W LOC DEV 1ST LESION IMG BX SPEC US  GUIDE 03/02/2024 [facility]   BREAST BIOPSY Left 07/12/2024   US  LT RADIOACTIVE SEED LOC 07/12/2024 [facility]   BREAST LUMPECTOMY WITH RADIOACTIVE SEED AND SENTINEL LYMPH NODE BIOPSY Left 07/14/2024   Procedure: BREAST LUMPECTOMY WITH RADIOACTIVE SEED AND SENTINEL LYMPH NODE BIOPSY;  Surgeon: Ebbie Cough, MD;  Location: [facility];  Service: General;  Laterality: Left;   COLONOSCOPY  2015   KIDNEY SURGERY     stenting   ORIF ANKLE FRACTURE Left 11/05/2018   Procedure: OPEN REDUCTION INTERNAL FIXATION (ORIF) LEFT TRIMALLEOLAR ANKLE FRACTURE WITH OR WITHOUT FIXATION OF POSTERIOR LIP, XR 3V LEFT ANKLE;  Surgeon: Beverley Evalene BIRCH, MD;  Location: [facility];  Service: Orthopedics;  Laterality: Left;   VAGINAL HYSTERECTOMY  2015   ovaries  remain, benign findings, was done for abnormal pap's    OB History   No obstetric history on file.      Home Medications    Prior to Admission medications  Medication Sig Start Date End Date Taking? Authorizing Provider  amoxicillin -clavulanate (AUGMENTIN ) 875-125 MG tablet Take 1 tablet by mouth every 12 (twelve) hours. 09/11/24  Yes Seanna Sisler  N, FNP  diphenhydrAMINE  (BENADRYL ) 25 MG tablet Take 1 tablet (25 mg total) by mouth every 6 (six) hours as needed. 09/11/24  Yes Valente Fosberg  N, FNP  ACCU-CHEK AVIVA PLUS test strip USE AS DIRECTED UP TO 4 TIMES A DAY 05/14/21   Luke Chiquita SAUNDERS, DO  albuterol  (VENTOLIN  HFA) 108 (90 Base)  MCG/ACT inhaler Inhale 2 puffs into the lungs every 4 (four) hours as needed for wheezing or shortness of breath. 08/16/24   Ozell Heron HERO, MD  amitriptyline  (ELAVIL ) 100 MG tablet Take 1 tablet (100 mg total) by mouth at bedtime. 09/01/24   Arfeen, Leni DASEN, MD  Blood Pressure Monitoring (BLOOD PRESSURE MONITOR/L CUFF) MISC 1 Device by Does not apply route daily. 02/16/19   Koberlein, Junell C, MD  cetirizine  (ZYRTEC ) 10 MG tablet TAKE 1 TABLET BY MOUTH EVERY DAY 06/27/24   Ozell Heron HERO, MD  diazepam  (VALIUM ) 2 MG tablet Take two to three tab daily as needed for anxiety 09/01/24   Arfeen, Leni DASEN, MD  diclofenac  (VOLTAREN ) 75 MG EC tablet TAKE 1 TABLET TWICE A DAY AS NEEDED FOR MODERATE PAIN 04/28/24   Ozell Heron HERO, MD  furosemide  (LASIX ) 20 MG tablet TAKE 1 TABLET BY MOUTH EVERY DAY 06/27/24   Ozell Heron HERO, MD  levothyroxine  (SYNTHROID ) 50 MCG tablet Take 1 tablet (50 mcg total) by mouth daily. 08/16/24   Ozell Heron HERO, MD  lurasidone  (LATUDA ) 40 MG TABS tablet Take 1 tablet (40 mg total) by mouth daily with breakfast. 09/01/24   Arfeen, Leni DASEN, MD  Menthol, Topical Analgesic, (BENGAY EX) Apply 1 application  topically as needed (elbow/wrist/back/hip/knee pain.).    [provider]  methocarbamol  (ROBAXIN ) 750 MG tablet TAKE 1 TABLET (750 MG TOTAL) BY MOUTH EVERY 8 (EIGHT) HOURS AS NEEDED FOR MUSCLE SPASMS. 06/21/24   Ozell Heron HERO, MD  Multiple Vitamin (MULTIVITAMIN WITH MINERALS) TABS tablet Take 1 tablet by mouth daily. Women's 50+    [provider]  omeprazole  (PRILOSEC) 40 MG capsule TAKE 1 CAPSULE (40 MG TOTAL) BY MOUTH DAILY. 08/08/24   Ozell Heron HERO, MD  oxyCODONE  (OXY IR/ROXICODONE ) 5 MG immediate release tablet Take 1 tablet (5 mg total) by mouth every 6 (six) hours as needed. Patient not taking: Reported on 09/01/2024 07/14/24   Ebbie Cough, MD  pregabalin  (LYRICA ) 100 MG capsule TAKE 1 CAPSULE BY MOUTH TWICE A DAY 08/21/24   Ozell Heron HERO, MD  Semaglutide ,0.25 or 0.5MG /DOS, (OZEMPIC , 0.25 OR 0.5 MG/DOSE,) 2 MG/3ML SOPN Inject 0.5 mg as directed once a week. INJECT 0.5 MG INTO THE SKIN ONE TIME PER WEEK 08/16/24   Ozell Heron HERO, MD  triamcinolone  cream (KENALOG ) 0.1 % Apply 1 Application topically 2 (two) times daily. Patient not taking: Reported on 09/01/2024 08/16/24   Ozell Heron HERO, MD    Family History Family History  Problem Relation Age of Onset   COPD Mother 57   Alcohol abuse Mother    CAD Father    Colon cancer Father 87   Ankylosing spondylitis Sister    Drug abuse Sister    Alcohol abuse Sister  Prostate cancer Maternal Uncle    Other Maternal Grandmother        lived to be over 100   Lung cancer Maternal Grandfather    Aneurysm Paternal Grandmother        brain    Social History Social History[1]   Allergies   Bupropion, Duloxetine, Sulfa antibiotics, Metformin and related, Paxil [paroxetine hcl], Prozac [fluoxetine hcl], Trintellix [vortioxetine], Tylenol  [acetaminophen ], and Zoloft [sertraline hcl]   Review of Systems Review of Systems  Per HPI  Physical Exam Triage Vital Signs ED Triage Vitals  Encounter Vitals Group     BP 09/11/24 1822 109/76     Girls Systolic BP Percentile --      Girls Diastolic BP Percentile --      Boys Systolic BP Percentile --      Boys Diastolic BP Percentile --      Pulse Rate 09/11/24 1822 84     Resp 09/11/24 1822 18     Temp 09/11/24 1822 98.1 F (36.7 C)     Temp Source 09/11/24 1822 Oral     SpO2 09/11/24 1822 93 %     Weight --      Height --      Head Circumference --      Peak Flow --      Pain Score 09/11/24 1821 5     Pain Loc --      Pain Education --      Exclude from Growth Chart --    No data found.  Updated Vital Signs BP 109/76 (BP Location: Right Arm)   Pulse 84   Temp 98.1 F (36.7 C) (Oral)   Resp 18   SpO2 93%   Visual Acuity Right Eye Distance:   Left Eye Distance:   Bilateral Distance:     Right Eye Near:   Left Eye Near:    Bilateral Near:     Physical Exam Vitals and nursing note reviewed.  Constitutional:      Appearance: Normal appearance.  HENT:     Head: Normocephalic and atraumatic.     Right Ear: External ear normal.     Left Ear: External ear normal.     Nose: Nose normal.     Mouth/Throat:     Mouth: Mucous membranes are moist.  Eyes:     Conjunctiva/sclera: Conjunctivae normal.  Cardiovascular:     Rate and Rhythm: Normal rate.     Pulses: Normal pulses.  Pulmonary:     Effort: Pulmonary effort is normal. No respiratory distress.  Musculoskeletal:        General: Normal range of motion.  Skin:    General: Skin is warm and dry.     Capillary Refill: Capillary refill takes less than 2 seconds.     Findings: Erythema present.      Neurological:     General: No focal deficit present.     Mental Status: She is alert.  Psychiatric:        Mood and Affect: Mood normal.      UC Treatments / Results  Labs (all labs ordered are listed, but only abnormal results are displayed) Labs Reviewed - No data to display  EKG   Radiology No results found.  Procedures Procedures (including critical care time)  Medications Ordered in UC Medications - No data to display  Initial Impression / Assessment and Plan / UC Course  I have reviewed the triage vital signs and the nursing notes.  Pertinent labs &  imaging results that were available during my care of the patient were reviewed by me and considered in my medical decision making (see chart for details).  Vitals and triage reviewed, patient is hemodynamically stable.  Dorsal aspect of left hand is a scab, suspect this is site of the presumed spider bite.  Warmth, erythema, swelling and tenderness extending to mid forearm, worsened overnight.  Presentation concerning for cellulitis.  Will treat with Augmentin .  Patient is immunocompromise, currently afebrile without tachycardia.  Strict emergency  precautions given if symptoms evolve or worsen.  Plan of care, follow-up care and return precautions given, no questions at this time.    Final Clinical Impressions(s) / UC Diagnoses   Final diagnoses:  Cellulitis of left upper extremity     Discharge Instructions      Take the antibiotics twice daily with food to help prevent stomach upset  Benadryl  as needed for breakthrough itching  Swelling, pain and redness should improve over the next 2-3 days on antibiotics   If no improvement or if fever develops, seek immediate care at the nearest emergency department     ED Prescriptions     Medication Sig Dispense Auth. Provider   diphenhydrAMINE  (BENADRYL ) 25 MG tablet Take 1 tablet (25 mg total) by mouth every 6 (six) hours as needed. 30 tablet Dreama, Sheffield Hawker  N, FNP   amoxicillin -clavulanate (AUGMENTIN ) 875-125 MG tablet Take 1 tablet by mouth every 12 (twelve) hours. 14 tablet Dreama, Karielle Davidow  N, FNP      PDMP not reviewed this encounter.     [1]  Social History Tobacco Use   Smoking status: Never   Smokeless tobacco: Never  Vaping Use   Vaping status: Former   Quit date: 10/23/2018   Devices: tried it  Substance Use Topics   Alcohol use: Not Currently   Drug use: Yes    Types: Marijuana     Dreama, Jacquise Rarick  N, FNP 09/11/24 1843  "

## 2024-09-12 ENCOUNTER — Ambulatory Visit

## 2024-09-12 DIAGNOSIS — C50512 Malignant neoplasm of lower-outer quadrant of left female breast: Secondary | ICD-10-CM | POA: Diagnosis not present

## 2024-09-12 DIAGNOSIS — S60562A Insect bite (nonvenomous) of left hand, initial encounter: Secondary | ICD-10-CM | POA: Diagnosis not present

## 2024-09-12 LAB — CBC WITH DIFFERENTIAL/PLATELET
Abs Immature Granulocytes: 0.01 K/uL (ref 0.00–0.07)
Basophils Absolute: 0 K/uL (ref 0.0–0.1)
Basophils Relative: 1 %
Eosinophils Absolute: 0.2 K/uL (ref 0.0–0.5)
Eosinophils Relative: 3 %
HCT: 48.1 % — ABNORMAL HIGH (ref 36.0–46.0)
Hemoglobin: 15.8 g/dL — ABNORMAL HIGH (ref 12.0–15.0)
Immature Granulocytes: 0 %
Lymphocytes Relative: 39 %
Lymphs Abs: 2.3 K/uL (ref 0.7–4.0)
MCH: 31.3 pg (ref 26.0–34.0)
MCHC: 32.8 g/dL (ref 30.0–36.0)
MCV: 95.2 fL (ref 80.0–100.0)
Monocytes Absolute: 0.3 K/uL (ref 0.1–1.0)
Monocytes Relative: 6 %
Neutro Abs: 3.1 K/uL (ref 1.7–7.7)
Neutrophils Relative %: 51 %
Platelets: 23 K/uL — CL (ref 150–400)
RBC: 5.05 MIL/uL (ref 3.87–5.11)
RDW: 13.4 % (ref 11.5–15.5)
WBC: 5.9 K/uL (ref 4.0–10.5)
nRBC: 0.5 % — ABNORMAL HIGH (ref 0.0–0.2)

## 2024-09-12 LAB — TECHNOLOGIST SMEAR REVIEW: Plt Morphology: NORMAL

## 2024-09-12 LAB — COMPREHENSIVE METABOLIC PANEL WITH GFR
ALT: 54 U/L — ABNORMAL HIGH (ref 0–44)
AST: 50 U/L — ABNORMAL HIGH (ref 15–41)
Albumin: 4.5 g/dL (ref 3.5–5.0)
Alkaline Phosphatase: 157 U/L — ABNORMAL HIGH (ref 38–126)
Anion gap: 11 (ref 5–15)
BUN: 11 mg/dL (ref 8–23)
CO2: 26 mmol/L (ref 22–32)
Calcium: 9.6 mg/dL (ref 8.9–10.3)
Chloride: 102 mmol/L (ref 98–111)
Creatinine, Ser: 1.05 mg/dL — ABNORMAL HIGH (ref 0.44–1.00)
GFR, Estimated: >60 mL/min
Glucose, Bld: 94 mg/dL (ref 70–99)
Potassium: 4.5 mmol/L (ref 3.5–5.1)
Sodium: 139 mmol/L (ref 135–145)
Total Bilirubin: 0.4 mg/dL (ref 0.0–1.2)
Total Protein: 7.8 g/dL (ref 6.5–8.1)

## 2024-09-12 LAB — PLATELET COUNT: Platelets: 172 K/uL (ref 150–400)

## 2024-09-12 MED ORDER — DIPHENHYDRAMINE-ZINC ACETATE 2-0.1 % EX CREA
TOPICAL_CREAM | Freq: Once | CUTANEOUS | Status: AC
  Filled 2024-09-12: qty 28

## 2024-09-12 MED ORDER — OXYCODONE HCL 5 MG PO TABS: 2.5000 mg | ORAL_TABLET | Freq: Four times a day (QID) | ORAL | 0 refills | Status: AC | PRN

## 2024-09-12 MED ORDER — DIPHENHYDRAMINE HCL 25 MG PO CAPS
25.0000 mg | ORAL_CAPSULE | Freq: Once | ORAL | Status: AC
  Administered 2024-09-12: 25 mg via ORAL
  Filled 2024-09-12: qty 1

## 2024-09-12 MED ORDER — HYDROMORPHONE HCL 1 MG/ML IJ SOLN
0.5000 mg | Freq: Once | INTRAMUSCULAR | Status: AC
  Administered 2024-09-12: 0.5 mg via INTRAVENOUS
  Filled 2024-09-12: qty 1

## 2024-09-12 MED ORDER — SODIUM CHLORIDE 0.9 % IV SOLN
2.0000 g | Freq: Once | INTRAVENOUS | Status: AC
  Administered 2024-09-12: 2 g via INTRAVENOUS
  Filled 2024-09-12: qty 20

## 2024-09-12 MED ORDER — DIPHENHYDRAMINE HCL 25 MG PO TABS: 25.0000 mg | ORAL_TABLET | Freq: Four times a day (QID) | ORAL | 0 refills | Status: AC

## 2024-09-12 NOTE — ED Notes (Signed)
 PT axox4. GCS 15. PT and significant other of patient verbalize understanding of discharge instructions, follow up and new Rx. PT ambulated out of er with steady gait to transportation home with significant other

## 2024-09-12 NOTE — ED Provider Notes (Signed)
 " [place] EM[facility] AT [facility] Provider Note  CSN: [id number] Arrival date & time: 09/11/24 2119  Chief Complaint(s) Insect Bite  History provided by patient. HPI & MDM Tiffany Reese is a 63 y.o. female with a past medical history listed below including breast cancer status post resection currently undergoing radiation therapy not on chemotherapy currently who presents to the emergency department with left hand pain, swelling and redness.  She reports waking up from a nap yesterday while napping on her living room recliner and feeling itching in the back of right hand.  She did not see any insects actually bite her but believes she was bitten.  Since then, she has noted increased swelling, itchiness and redness.  Redness is streaking to the elbow.  Patient went to urgent care and was prescribed Augmentin  for possible cellulitis but was unable to get to the pharmacy and time to pick it up.  Prompting her visit here.   HPI    Medical Decision Making Amount and/or Complexity of Data Reviewed Labs: ordered. Decision-making details documented in ED Course.  Risk OTC drugs. Prescription drug management. Parenteral controlled substances. Decision regarding hospitalization.    Left hand pain swelling and redness.  Differential diagnoses considered and workup below.  Possible local allergic reaction from insect bite however given the streaking, will need to cover for cellulitis.  Patient already has Augmentin  prescribed.  Will give a dose of Rocephin  here. Labs obtained to assess need for admission. CBC without leukocytosis.  They did show thrombocytopenia with platelets of 23 which is significant decrease in 2 months. CMP without significant electrolyte derangements or renal insufficiency.  Baseline transaminitis.  Platelets count was repeated and smear ordered to confirm thrombocytopenia Platelets count was 172 and confirmed no unremarkable morphology  under the smear.  Initial platelet count was likely superfluous.    Final Clinical Impression(s) / ED Diagnoses Final diagnoses:  Insect bite of left hand, initial encounter  Redness and swelling of hand   The patient appears reasonably screened and/or stabilized for discharge and I doubt any other medical condition or other [facility] requiring further screening, evaluation, or treatment in the ED at this time. I have discussed the findings, Dx and Tx plan with the patient/family who expressed understanding and agree(s) with the plan. Discharge instructions discussed at length. The patient/family was given strict return precautions who verbalized understanding of the instructions. No further questions at time of discharge.  Disposition: Discharge  Condition: Good  ED Discharge Orders          Ordered    diphenhydrAMINE  (BENADRYL ) 25 MG tablet  Every 6 hours        09/12/24 0511    oxyCODONE  (ROXICODONE ) 5 MG immediate release tablet  Every 6 hours PRN        09/12/24 0511            {[place]  narcotic database reviewed and no active prescriptions noted.  Follow Up: Ozell Heron HERO, MD [address] [place] [address] [phone]  Call  to schedule an appointment for close follow up     Past Medical History Past Medical History:  Diagnosis Date   Anemia    Anxiety    Arthritis    Colitis 2019   Colon polyps    Depression, major    Diabetes mellitus type II, controlled (HCC)    Family history of colon cancer    Family history of prostate cancer    Fibromyalgia  Frequent headaches    used to have severe migraines   GERD (gastroesophageal reflux disease)    Hepatic steatosis    Hypertension    Hypothyroid    Obesity    PTSD (post-traumatic stress disorder)    Patient Active Problem List   Diagnosis Date Noted   Hyperlipidemia associated with type 2 diabetes mellitus (HCC) 08/16/2024   Genetic testing 03/28/2024   Family history of  prostate cancer    Family history of colon cancer    Malignant neoplasm of lower-outer quadrant of left breast of female, estrogen receptor positive (HCC) 03/07/2024   Fibromyalgia 08/14/2023   Elevated d-dimer 11/16/2018   At risk for adverse drug reaction 11/10/2018   GERD (gastroesophageal reflux disease)    Hypertension    Diabetes mellitus type II, controlled (HCC)    PTSD (post-traumatic stress disorder) 11/01/2018   Anxiety 11/01/2018   Depression, recurrent 11/01/2018   Closed left ankle fracture 11/01/2018   Anemia 11/01/2018   Hypothyroid 11/01/2018   Fatty liver disease, nonalcoholic 09/01/2018   BMI 45.0-49.9, adult (HCC) 04/29/2018   DDD (degenerative disc disease), thoracolumbar 04/29/2018   Home Medication(s) Prior to Admission medications  Medication Sig Start Date End Date Taking? Authorizing Provider  diphenhydrAMINE  (BENADRYL ) 25 MG tablet Take 1 tablet (25 mg total) by mouth every 6 (six) hours. 09/12/24 09/17/24 Yes Djeneba Barsch, Raynell Moder, MD  oxyCODONE  (ROXICODONE ) 5 MG immediate release tablet Take 0.5-1 tablets (2.5-5 mg total) by mouth every 6 (six) hours as needed for up to 5 days for severe pain (pain score 7-10). 09/12/24 09/17/24 Yes Novalynn Branaman, Raynell Moder, MD  ACCU-CHEK AVIVA PLUS test strip USE AS DIRECTED UP TO 4 TIMES A DAY 05/14/21   Luke Chiquita SAUNDERS, DO  albuterol  (VENTOLIN  HFA) 108 (90 Base) MCG/ACT inhaler Inhale 2 puffs into the lungs every 4 (four) hours as needed for wheezing or shortness of breath. 08/16/24   Ozell Heron HERO, MD  amitriptyline  (ELAVIL ) 100 MG tablet Take 1 tablet (100 mg total) by mouth at bedtime. 09/01/24   Arfeen, Leni DASEN, MD  amoxicillin -clavulanate (AUGMENTIN ) 875-125 MG tablet Take 1 tablet by mouth every 12 (twelve) hours. 09/11/24   Dreama, [place]  N, FNP  Blood Pressure Monitoring (BLOOD PRESSURE MONITOR/L CUFF) MISC 1 Device by Does not apply route daily. 02/16/19   Koberlein, Junell C, MD  cetirizine  (ZYRTEC ) 10 MG  tablet TAKE 1 TABLET BY MOUTH EVERY DAY 06/27/24   Ozell Heron HERO, MD  diazepam  (VALIUM ) 2 MG tablet Take two to three tab daily as needed for anxiety 09/01/24   Arfeen, Leni DASEN, MD  diclofenac  (VOLTAREN ) 75 MG EC tablet TAKE 1 TABLET TWICE A DAY AS NEEDED FOR MODERATE PAIN 04/28/24   Ozell Heron HERO, MD  furosemide  (LASIX ) 20 MG tablet TAKE 1 TABLET BY MOUTH EVERY DAY 06/27/24   Ozell Heron HERO, MD  levothyroxine  (SYNTHROID ) 50 MCG tablet Take 1 tablet (50 mcg total) by mouth daily. 08/16/24   Ozell Heron HERO, MD  lurasidone  (LATUDA ) 40 MG TABS tablet Take 1 tablet (40 mg total) by mouth daily with breakfast. 09/01/24   Arfeen, Leni DASEN, MD  Menthol, Topical Analgesic, (BENGAY EX) Apply 1 application  topically as needed (elbow/wrist/back/hip/knee pain.).    [provider]  methocarbamol  (ROBAXIN ) 750 MG tablet TAKE 1 TABLET (750 MG TOTAL) BY MOUTH EVERY 8 (EIGHT) HOURS AS NEEDED FOR MUSCLE SPASMS. 06/21/24   Ozell Heron HERO, MD  Multiple Vitamin (MULTIVITAMIN WITH MINERALS) TABS tablet Take 1 tablet by  mouth daily. Women's 50+    [provider]  omeprazole  (PRILOSEC) 40 MG capsule TAKE 1 CAPSULE (40 MG TOTAL) BY MOUTH DAILY. 08/08/24   Ozell Heron HERO, MD  pregabalin  (LYRICA ) 100 MG capsule TAKE 1 CAPSULE BY MOUTH TWICE A DAY 08/21/24   Ozell Heron HERO, MD  Semaglutide ,0.25 or 0.5MG /DOS, (OZEMPIC , 0.25 OR 0.5 MG/DOSE,) 2 MG/3ML SOPN Inject 0.5 mg as directed once a week. INJECT 0.5 MG INTO THE SKIN ONE TIME PER WEEK 08/16/24   Ozell Heron HERO, MD  triamcinolone  cream (KENALOG ) 0.1 % Apply 1 Application topically 2 (two) times daily. Patient not taking: Reported on 09/01/2024 08/16/24   Ozell Heron HERO, MD                                                                                                                                    Allergies Bupropion, Duloxetine, Sulfa antibiotics, Metformin and related, Paxil [paroxetine hcl], Prozac [fluoxetine hcl],  Trintellix [vortioxetine], Tylenol  [acetaminophen ], and Zoloft [sertraline hcl]  Review of Systems Review of Systems As noted in HPI  Physical Exam Vital Signs  I have reviewed the triage vital signs BP (!) 139/91   Pulse 73   Temp 98.4 F (36.9 C)   Resp 18   Ht 5' 6 (1.676 m)   Wt 107.5 kg   SpO2 97%   BMI 38.25 kg/m   Physical Exam Vitals reviewed.  Constitutional:      General: She is not in acute distress.    Appearance: She is well-developed. She is not diaphoretic.  HENT:     Head: Normocephalic and atraumatic.     Right Ear: External ear normal.     Left Ear: External ear normal.     Nose: Nose normal.  Eyes:     General: No scleral icterus.    Conjunctiva/sclera: Conjunctivae normal.  Neck:     Trachea: Phonation normal.  Cardiovascular:     Rate and Rhythm: Normal rate and regular rhythm.  Pulmonary:     Effort: Pulmonary effort is normal. No respiratory distress.     Breath sounds: No stridor.  Abdominal:     General: There is no distension.  Musculoskeletal:        General: Normal range of motion.     Left forearm: Swelling and tenderness present.     Left wrist: Swelling and tenderness present.     Right hand: Normal pulse.     Left hand: Swelling and tenderness present. Normal pulse.       Arms:     Cervical back: Normal range of motion.  Neurological:     Mental Status: She is alert and oriented to person, place, and time.  Psychiatric:        Behavior: Behavior normal.     ED Results and Treatments Labs (all labs ordered are listed, but only abnormal results are displayed) Labs Reviewed  CBC WITH DIFFERENTIAL/PLATELET -  Abnormal; Notable for the following components:      Result Value   Hemoglobin 15.8 (*)    HCT 48.1 (*)    Platelets 23 (*)    nRBC 0.5 (*)    All other components within normal limits  COMPREHENSIVE METABOLIC PANEL WITH GFR - Abnormal; Notable for the following components:   Creatinine, Ser 1.05 (*)    AST 50 (*)     ALT 54 (*)    Alkaline Phosphatase 157 (*)    All other components within normal limits  PLATELET COUNT  TECHNOLOGIST SMEAR REVIEW                                                                                                                         EKG  EKG Interpretation Date/Time:    Ventricular Rate:    PR Interval:    QRS Duration:    QT Interval:    QTC Calculation:   R Axis:      Text Interpretation:         Radiology No results found.  Medications Ordered in ED Medications  diphenhydrAMINE -zinc  acetate (BENADRYL ) 2-0.1 % cream ( Topical Given 09/12/24 0133)  cefTRIAXone  (ROCEPHIN ) 2 g in sodium chloride  0.9 % 100 mL IVPB (0 g Intravenous Stopped 09/12/24 0131)  HYDROmorphone  (DILAUDID ) injection 0.5 mg (0.5 mg Intravenous Given 09/12/24 0234)  diphenhydrAMINE  (BENADRYL ) capsule 25 mg (25 mg Oral Given 09/12/24 0523)   Procedures Procedures  (including critical care time)   This chart was dictated using voice recognition software.  Despite best efforts to proofread,  errors can occur which can change the documentation meaning.   Trine Raynell Moder, MD 09/12/24 [phone]  "

## 2024-09-13 ENCOUNTER — Ambulatory Visit
Admission: RE | Admit: 2024-09-13 | Discharge: 2024-09-13 | Disposition: A | Discharge status: Home / Self Care | Source: Ambulatory Visit | Attending: Radiation Oncology | Admitting: Radiation Oncology

## 2024-09-13 ENCOUNTER — Other Ambulatory Visit: Payer: Self-pay

## 2024-09-13 DIAGNOSIS — C50512 Malignant neoplasm of lower-outer quadrant of left female breast: Secondary | ICD-10-CM | POA: Diagnosis not present

## 2024-09-13 LAB — RAD ONC ARIA SESSION SUMMARY
Course Elapsed Days: 20
Plan Fractions Treated to Date: 13
Plan Prescribed Dose Per Fraction: 2.66 Gy
Plan Total Fractions Prescribed: 16
Plan Total Prescribed Dose: 42.56 Gy
Reference Point Dosage Given to Date: 34.58 Gy
Reference Point Session Dosage Given: 2.66 Gy
Session Number: 13

## 2024-09-14 ENCOUNTER — Ambulatory Visit

## 2024-09-14 ENCOUNTER — Ambulatory Visit
Admission: RE | Admit: 2024-09-14 | Discharge: 2024-09-14 | Disposition: A | Source: Ambulatory Visit | Attending: Radiation Oncology

## 2024-09-14 ENCOUNTER — Other Ambulatory Visit: Payer: Self-pay

## 2024-09-14 DIAGNOSIS — C50512 Malignant neoplasm of lower-outer quadrant of left female breast: Secondary | ICD-10-CM | POA: Diagnosis not present

## 2024-09-14 LAB — RAD ONC ARIA SESSION SUMMARY
Course Elapsed Days: 21
Plan Fractions Treated to Date: 14
Plan Prescribed Dose Per Fraction: 2.66 Gy
Plan Total Fractions Prescribed: 16
Plan Total Prescribed Dose: 42.56 Gy
Reference Point Dosage Given to Date: 37.24 Gy
Reference Point Session Dosage Given: 2.66 Gy
Session Number: 14

## 2024-09-19 ENCOUNTER — Ambulatory Visit

## 2024-09-19 ENCOUNTER — Ambulatory Visit
Admission: RE | Admit: 2024-09-19 | Discharge: 2024-09-19 | Disposition: A | Discharge status: Home / Self Care | Source: Ambulatory Visit | Attending: Radiation Oncology | Admitting: Radiation Oncology

## 2024-09-19 ENCOUNTER — Other Ambulatory Visit: Payer: Self-pay

## 2024-09-19 ENCOUNTER — Telehealth: Payer: Self-pay

## 2024-09-19 DIAGNOSIS — C50512 Malignant neoplasm of lower-outer quadrant of left female breast: Secondary | ICD-10-CM | POA: Diagnosis not present

## 2024-09-19 LAB — RAD ONC ARIA SESSION SUMMARY
Course Elapsed Days: 26
Plan Fractions Treated to Date: 15
Plan Prescribed Dose Per Fraction: 2.66 Gy
Plan Total Fractions Prescribed: 16
Plan Total Prescribed Dose: 42.56 Gy
Reference Point Dosage Given to Date: 39.9 Gy
Reference Point Session Dosage Given: 2.66 Gy
Session Number: 15

## 2024-09-19 NOTE — Telephone Encounter (Signed)
 Pharmacy Patient Advocate Encounter   Received notification from Onbase that prior authorization for Ozempic  (0.25 or 0.5 MG/DOSE) 2MG /3ML pen-injectors  is due for renewal.   Insurance verification completed.   The patient is insured through [facility].  Action: PA required and submitted KEY/EOC/Request #: [id number] due to: This medication or product was previously approved on [id number] from 2023-12-14 to 2024-09-21.

## 2024-09-20 ENCOUNTER — Ambulatory Visit

## 2024-09-20 ENCOUNTER — Ambulatory Visit
Admission: RE | Admit: 2024-09-20 | Discharge: 2024-09-20 | Disposition: A | Discharge status: Home / Self Care | Source: Ambulatory Visit | Attending: Radiation Oncology | Admitting: Radiation Oncology

## 2024-09-20 ENCOUNTER — Other Ambulatory Visit: Payer: Self-pay

## 2024-09-20 DIAGNOSIS — C50512 Malignant neoplasm of lower-outer quadrant of left female breast: Secondary | ICD-10-CM | POA: Diagnosis not present

## 2024-09-20 LAB — RAD ONC ARIA SESSION SUMMARY
Course Elapsed Days: 27
Plan Fractions Treated to Date: 16
Plan Prescribed Dose Per Fraction: 2.66 Gy
Plan Total Fractions Prescribed: 16
Plan Total Prescribed Dose: 42.56 Gy
Reference Point Dosage Given to Date: 42.56 Gy
Reference Point Session Dosage Given: 2.66 Gy
Session Number: 16

## 2024-09-20 NOTE — Telephone Encounter (Signed)
 Pharmacy Patient Advocate Encounter  Received notification from [employer] that Prior Authorization for Ozempic  (0.25 or 0.5 MG/DOSE) 2MG /3ML pen-injectors  has been APPROVED from 09/19/2024 to 09/21/2025   PA #/Case ID/Reference #: [id number]

## 2024-09-21 ENCOUNTER — Ambulatory Visit
Admission: RE | Admit: 2024-09-21 | Discharge: 2024-09-21 | Disposition: A | Discharge status: Home / Self Care | Source: Ambulatory Visit | Attending: Radiation Oncology | Admitting: Radiation Oncology

## 2024-09-21 ENCOUNTER — Other Ambulatory Visit: Payer: Self-pay

## 2024-09-21 ENCOUNTER — Ambulatory Visit

## 2024-09-21 DIAGNOSIS — C50512 Malignant neoplasm of lower-outer quadrant of left female breast: Secondary | ICD-10-CM | POA: Diagnosis not present

## 2024-09-21 LAB — RAD ONC ARIA SESSION SUMMARY
Course Elapsed Days: 28
Plan Fractions Treated to Date: 1
Plan Prescribed Dose Per Fraction: 2 Gy
Plan Total Fractions Prescribed: 4
Plan Total Prescribed Dose: 8 Gy
Reference Point Dosage Given to Date: 2 Gy
Reference Point Session Dosage Given: 2 Gy
Session Number: 17

## 2024-09-23 ENCOUNTER — Ambulatory Visit
Admission: RE | Admit: 2024-09-23 | Discharge: 2024-09-23 | Disposition: A | Discharge status: Home / Self Care | Source: Ambulatory Visit | Attending: Radiation Oncology | Admitting: Radiation Oncology

## 2024-09-23 ENCOUNTER — Ambulatory Visit

## 2024-09-23 ENCOUNTER — Other Ambulatory Visit: Payer: Self-pay

## 2024-09-23 ENCOUNTER — Other Ambulatory Visit (HOSPITAL_COMMUNITY): Payer: Self-pay

## 2024-09-23 DIAGNOSIS — C50512 Malignant neoplasm of lower-outer quadrant of left female breast: Secondary | ICD-10-CM | POA: Diagnosis present

## 2024-09-23 DIAGNOSIS — Z170 Estrogen receptor positive status [ER+]: Secondary | ICD-10-CM | POA: Insufficient documentation

## 2024-09-23 LAB — RAD ONC ARIA SESSION SUMMARY
Course Elapsed Days: 30
Plan Fractions Treated to Date: 2
Plan Prescribed Dose Per Fraction: 2 Gy
Plan Total Fractions Prescribed: 4
Plan Total Prescribed Dose: 8 Gy
Reference Point Dosage Given to Date: 4 Gy
Reference Point Session Dosage Given: 2 Gy
Session Number: 18

## 2024-09-23 MED ORDER — RADIAPLEXRX EX GEL: Freq: Once | CUTANEOUS | Status: AC

## 2024-09-24 ENCOUNTER — Other Ambulatory Visit (HOSPITAL_COMMUNITY): Payer: Self-pay | Admitting: Psychiatry

## 2024-09-24 DIAGNOSIS — F419 Anxiety disorder, unspecified: Secondary | ICD-10-CM

## 2024-09-24 DIAGNOSIS — F4310 Post-traumatic stress disorder, unspecified: Secondary | ICD-10-CM

## 2024-09-26 ENCOUNTER — Other Ambulatory Visit: Payer: Self-pay

## 2024-09-26 ENCOUNTER — Ambulatory Visit: Admission: RE | Admit: 2024-09-26 | Discharge: 2024-09-26 | Attending: Radiation Oncology

## 2024-09-26 ENCOUNTER — Ambulatory Visit

## 2024-09-26 DIAGNOSIS — C50512 Malignant neoplasm of lower-outer quadrant of left female breast: Secondary | ICD-10-CM | POA: Diagnosis not present

## 2024-09-26 LAB — RAD ONC ARIA SESSION SUMMARY
Course Elapsed Days: 33
Plan Fractions Treated to Date: 3
Plan Prescribed Dose Per Fraction: 2 Gy
Plan Total Fractions Prescribed: 4
Plan Total Prescribed Dose: 8 Gy
Reference Point Dosage Given to Date: 6 Gy
Reference Point Session Dosage Given: 2 Gy
Session Number: 19

## 2024-09-27 ENCOUNTER — Other Ambulatory Visit: Payer: Self-pay

## 2024-09-27 ENCOUNTER — Ambulatory Visit

## 2024-09-27 ENCOUNTER — Ambulatory Visit: Admission: RE | Admit: 2024-09-27 | Discharge: 2024-09-27 | Attending: Radiation Oncology

## 2024-09-27 DIAGNOSIS — C50512 Malignant neoplasm of lower-outer quadrant of left female breast: Secondary | ICD-10-CM | POA: Diagnosis not present

## 2024-09-27 LAB — RAD ONC ARIA SESSION SUMMARY
Course Elapsed Days: 34
Plan Fractions Treated to Date: 4
Plan Prescribed Dose Per Fraction: 2 Gy
Plan Total Fractions Prescribed: 4
Plan Total Prescribed Dose: 8 Gy
Reference Point Dosage Given to Date: 8 Gy
Reference Point Session Dosage Given: 2 Gy
Session Number: 20

## 2024-09-28 NOTE — Radiation Completion Notes (Signed)
 Patient Name: Tiffany Reese, Tiffany Reese MRN: [id number] Date of Birth: 1961-07-27 Referring Physician: ONITA MATTOCK, M.D. Date of Service: 2024-09-28 Radiation Oncologist: Norleen Limes, M.D. [facility] [place]                             RADIATION ONCOLOGY END OF TREATMENT NOTE     Diagnosis: C50.512 Malignant neoplasm of lower-outer quadrant of left female breast Staging on 2024-08-08: Malignant neoplasm of lower-outer quadrant of left breast of female, estrogen receptor positive (HCC) T=pT2, N=pN0, M=cM0 Staging on 2024-03-02: Malignant neoplasm of lower-outer quadrant of left breast of female, estrogen receptor positive (HCC) T=cT2, N=cN0, M=cM0 Intent: Curative     ==========DELIVERED PLANS==========  First Treatment Date: 2024-08-24 Last Treatment Date: 2024-09-27   Plan Name: Breast_L_BH Site: Breast, Left Technique: 3D Mode: Photon Dose Per Fraction: 2.66 Gy Prescribed Dose (Delivered / Prescribed): 42.56 Gy / 42.56 Gy Prescribed Fxs (Delivered / Prescribed): 16 / 16   Plan Name: Brst_L_Bst_BH Site: Breast, Left Technique: 3D Mode: Photon Dose Per Fraction: 2 Gy Prescribed Dose (Delivered / Prescribed): 8 Gy / 8 Gy Prescribed Fxs (Delivered / Prescribed): 4 / 4     ==========ON TREATMENT VISIT DATES========== 2024-08-26, 2024-09-02, 2024-09-09, 2024-09-23     ==========UPCOMING VISITS========== 11/10/2024 [facility] OFFICE VISIT Curry Leni DASEN, MD  10/31/2024 [facility] POST TREATMENT CALL [facility]  10/06/2024 [facility] ONCOLOGY EST PT 20 Mattock Onita, MD  10/06/2024 [facility] LAB [facility] LAB        ==========APPENDIX - ON TREATMENT VISIT NOTES==========   See weekly On Treatment Notes in Epic for details in the Media tab (listed as Progress notes on the On Treatment Visit Dates listed above).

## 2024-09-30 ENCOUNTER — Ambulatory Visit: Payer: Self-pay

## 2024-09-30 NOTE — Telephone Encounter (Signed)
 FYI Only or Action Required?: FYI only for provider: UC advised.  Patient was last seen in primary care on 08/16/2024 by Ozell Heron HERO, MD.  Called Nurse Triage reporting Influenza.  Symptoms began yesterday.  Interventions attempted: Rest, hydration, or home remedies.  Symptoms are: unchanged.  Triage Disposition: See Physician Within 24 Hours (overriding See HCP Within 4 Hours (Or PCP Triage))  Patient/caregiver understands and will follow disposition?: Yes  Reason for Disposition  [1] Fever > 100.0 F (37.8 C) AND [2] diabetes mellitus or weak immune system (e.g., HIV positive, cancer chemo, splenectomy, organ transplant, chronic steroids)  Answer Assessment - Initial Assessment Questions Patient states that she completed an at home flu test and it shows that she has Flu A. Symptoms listed below. States that she does have SOB, but this has been ongoing with cancer treatment and denies worsening. She recently had a lumpectomy and just finished radiation treatment. Office visit or UC advised today-refuses, states she will go to UC in the morning. Advised to go to [facility] or ED today if symptoms worsen.   1. WORST SYMPTOM: What is your worst symptom? (e.g., cough, runny nose, muscle aches, headache, sore throat, fever)      Muscle aches  2. ONSET: When did your flu symptoms start?      Yesterday  3. COUGH: How bad is the cough?       Unknown  4. RESPIRATORY DISTRESS: Describe your breathing.      Patient has some sob, but she has been experiencing this with recent radiation treatment and reports that it is improved  5. FEVER: Do you have a fever? If Yes, ask: What is your temperature, how was it measured, and when did it start?     Yes, 100F  6. EXPOSURE: Were you exposed to someone with influenza?       Not sure  7. FLU VACCINE: Did you get a flu shot this year?     Unknown  8. HIGH RISK DISEASE: Do you have any chronic medical problems? (e.g., heart or  lung disease, asthma, weak immune system, or other HIGH RISK conditions)     Weak immune system, was undergoing radiation through 09/26/24  9. PREGNANCY: Is there any chance you are pregnant? When was your last menstrual period?     NA  10. OTHER SYMPTOMS: Do you have any other symptoms?  (e.g., runny nose, muscle aches, headache, sore throat)       Runny nose, muscle aches, fatigue, cough  Protocols used: Influenza (Flu) [facility]   Copied from CRM [id number]. Topic: Clinical - Red Word Triage >> Sep 30, 2024  4:57 PM Hadassah PARAS wrote: Red Word that prompted transfer to Nurse Triage: Pt got covid and flu test and test positive for strain A. Pt is experiencing fatigue, severe bodyaches and pains, fever, freezing and hot spells, cough, stuffy nose.  *Pt recently had a lumpectomy and is doing radiation therapy

## 2024-10-03 ENCOUNTER — Encounter: Payer: Self-pay | Admitting: Family Medicine

## 2024-10-03 ENCOUNTER — Ambulatory Visit: Admitting: Family Medicine

## 2024-10-03 VITALS — BP 118/70 | HR 90 | Temp 98.0°F | Wt 230.2 lb

## 2024-10-03 DIAGNOSIS — J101 Influenza due to other identified influenza virus with other respiratory manifestations: Secondary | ICD-10-CM | POA: Diagnosis not present

## 2024-10-03 DIAGNOSIS — R062 Wheezing: Secondary | ICD-10-CM

## 2024-10-03 MED ORDER — AMOXICILLIN-POT CLAVULANATE 875-125 MG PO TABS: 1.0000 | ORAL_TABLET | Freq: Two times a day (BID) | ORAL | 0 refills | Status: AC

## 2024-10-03 MED ORDER — OSELTAMIVIR PHOSPHATE 75 MG PO CAPS: 75.0000 mg | ORAL_CAPSULE | Freq: Two times a day (BID) | ORAL | 0 refills | Status: AC

## 2024-10-03 MED ORDER — METHYLPREDNISOLONE ACETATE 80 MG/ML IJ SUSP
80.0000 mg | Freq: Once | INTRAMUSCULAR | Status: AC
  Administered 2024-10-03: 80 mg via INTRAMUSCULAR

## 2024-10-03 NOTE — Progress Notes (Signed)
 "  Established Patient Office Visit  Subjective   Patient ID: Tiffany Reese, female    DOB: [birth year]  Age: 64 y.o. MRN: [id number]  Chief Complaint  Patient presents with   Medical Management of Chronic Issues    HPI   Patient is seen with positive influenza test at home less than 48 hours ago.  She had onset late Saturday of cough, body aches, headache, fatigue, and nasal congestion.  She has had cough productive of green sputum.  She states she felt more short of breath than usual and feels like she is having some wheezing.  Positive influenza A home test.  She has history of breast cancer and just recently finished radiation therapy.  She has albuterol  inhaler to use as needed.  She states she is very concerned about developing pneumonia in view of her recent cancer diagnosis and treatment.  She did not receive any chemotherapy.  Past Medical History:  Diagnosis Date   Anemia    Anxiety    Arthritis    Colitis 2019   Colon polyps    Depression, major    Diabetes mellitus type II, controlled (HCC)    Family history of colon cancer    Family history of prostate cancer    Fibromyalgia    Frequent headaches    used to have severe migraines   GERD (gastroesophageal reflux disease)    Hepatic steatosis    Hypertension    Hypothyroid    Obesity    PTSD (post-traumatic stress disorder)    Past Surgical History:  Procedure Laterality Date   ANKLE SURGERY Right    fracture repair   BREAST BIOPSY Left 03/02/2024   US  LT BREAST BX W LOC DEV 1ST LESION IMG BX SPEC US  GUIDE 03/02/2024 [facility]   BREAST BIOPSY Left 07/12/2024   US  LT RADIOACTIVE SEED LOC 07/12/2024 [facility]   BREAST LUMPECTOMY WITH RADIOACTIVE SEED AND SENTINEL LYMPH NODE BIOPSY Left 07/14/2024   Procedure: BREAST LUMPECTOMY WITH RADIOACTIVE SEED AND SENTINEL LYMPH NODE BIOPSY;  Surgeon: Ebbie Cough, MD;  Location: [facility] OR;  Service: General;  Laterality: Left;   COLONOSCOPY  2015    KIDNEY SURGERY     stenting   ORIF ANKLE FRACTURE Left 11/05/2018   Procedure: OPEN REDUCTION INTERNAL FIXATION (ORIF) LEFT TRIMALLEOLAR ANKLE FRACTURE WITH OR WITHOUT FIXATION OF POSTERIOR LIP, XR 3V LEFT ANKLE;  Surgeon: Beverley Evalene BIRCH, MD;  Location: [facility];  Service: Orthopedics;  Laterality: Left;   VAGINAL HYSTERECTOMY  2015   ovaries remain, benign findings, was done for abnormal pap's    reports that she has never smoked. She has never used smokeless tobacco. She reports that she does not currently use alcohol. She reports current drug use. Drug: Marijuana. family history includes Alcohol abuse in her mother and sister; Aneurysm in her paternal grandmother; Ankylosing spondylitis in her sister; CAD in her father; COPD (age of onset: 3) in her mother; Colon cancer (age of onset: 55) in her father; Drug abuse in her sister; Lung cancer in her maternal grandfather; Other in her maternal grandmother; Prostate cancer in her maternal uncle. Allergies[1]  Review of Systems  Constitutional:  Positive for chills, fever and malaise/fatigue.  Respiratory:  Positive for cough, sputum production, shortness of breath and wheezing. Negative for hemoptysis.   Cardiovascular:  Negative for chest pain.      Objective:     BP 118/70   Pulse 90   Temp 98 F (36.7 C) (  Oral)   Wt 230 lb 3.2 oz (104.4 kg)   SpO2 94%   BMI 37.16 kg/m  BP Readings from Last 3 Encounters:  10/03/24 118/70  09/12/24 (!) 139/91  09/11/24 109/76   Wt Readings from Last 3 Encounters:  10/03/24 230 lb 3.2 oz (104.4 kg)  09/11/24 236 lb 15.9 oz (107.5 kg)  08/16/24 236 lb 8 oz (107.3 kg)      Physical Exam Vitals reviewed.  Constitutional:      General: She is not in acute distress.    Appearance: She is not ill-appearing or toxic-appearing.  Cardiovascular:     Rate and Rhythm: Normal rate and regular rhythm.  Pulmonary:     Effort: Pulmonary effort is normal.     Breath sounds:  Wheezing present.     Comments: She has diffuse expiratory wheezes.  No rales.  Pulse ox 94% room air.  No retractions.  No rales. Musculoskeletal:     Cervical back: Neck supple.  Neurological:     Mental Status: She is alert.      No results found for any visits on 10/03/24.    The 10-year ASCVD risk score (Arnett DK, et al., 2019) is: 8%    Assessment & Plan:   Influenza A by home test less than 48 hours ago with reactive airway findings on exam.  Patient no respiratory distress.  Nontoxic in appearance.  She has breast cancer and just finished up recent radiation therapy.  She is very concerned about possibility of secondary infection.  We discussed the following  -Plenty of fluids and rest and continue over-the-counter analgesics as needed - Start Tamiflu  75 mg twice daily for 5 days - We agreed to start concomitant Augmentin  875 mg twice daily for 7 days with her history of pneumonia - Depo-Medrol  80 mg IM given for wheezing - Continue albuterol  as needed for reactive airway symptoms - Follow-up for any persistent or worsening symptoms  Wolm Scarlet, MD     [1]  Allergies Allergen Reactions   Bupropion Other (See Comments)    Causes agitation   Duloxetine Other (See Comments)    Causes involuntary movements   Sulfa Antibiotics Other (See Comments)    Worsened symptoms.   Metformin And Related     vomiting   Paxil [Paroxetine Hcl]     Insomnia; maniac   Prozac [Fluoxetine Hcl]     Hyperactive, insomnia   Trintellix [Vortioxetine]     Triggers fibromyalgia   Tylenol  [Acetaminophen ] Other (See Comments)    Patient has been informed per multiple physicians that she is to avoid due to liver damage   Zoloft [Sertraline Hcl]     Hyperactivity, insomnia   "

## 2024-10-03 NOTE — Telephone Encounter (Signed)
 Noted- ok to close.

## 2024-10-03 NOTE — Telephone Encounter (Signed)
 Spoke with the patient to inquire if she was seen in the urgent care or ER this weekend.  Patient stated she was not seen and an appt was scheduled today with Dr Micheal at 1:45pm.

## 2024-10-06 ENCOUNTER — Inpatient Hospital Stay: Attending: Hematology

## 2024-10-06 ENCOUNTER — Inpatient Hospital Stay: Admitting: Hematology

## 2024-10-12 ENCOUNTER — Other Ambulatory Visit: Payer: Self-pay | Admitting: Family Medicine

## 2024-10-12 DIAGNOSIS — M25572 Pain in left ankle and joints of left foot: Secondary | ICD-10-CM

## 2024-10-20 ENCOUNTER — Inpatient Hospital Stay: Admitting: Hematology

## 2024-10-20 ENCOUNTER — Inpatient Hospital Stay

## 2024-10-21 NOTE — Progress Notes (Incomplete)
" °  Radiation Oncology         [phone]) [phone] ________________________________  Name: Margurette Brener MRN: [id number]  Date of Service: 10/31/2024  DOB: [birth year]  Post Treatment Telephone Note  Diagnosis: Malignant neoplasm of lower-outer quadrant of left breast of female, estrogen receptor positive   First Treatment Date: 2024-08-24 Last Treatment Date: 2024-09-27   Plan Name: Breast_L_BH Site: Breast, Left Technique: 3D Mode: Photon Dose Per Fraction: 2.66 Gy Prescribed Dose (Delivered / Prescribed): 42.56 Gy / 42.56 Gy Prescribed Fxs (Delivered / Prescribed): 16 / 16   Plan Name: Brst_L_Bst_BH Site: Breast, Left Technique: 3D Mode: Photon Dose Per Fraction: 2 Gy Prescribed Dose (Delivered / Prescribed): 8 Gy / 8 Gy Prescribed Fxs (Delivered / Prescribed): 4 / 4  The patient {WAS/WAS NOT:[phone]::was not} available for call today.   Symptoms of fatigue {ACTIONS; HAVE/HAVE NOT:19434} improved since completing therapy.  Symptoms of skin changes {ACTIONS; HAVE/HAVE NOT:19434} improved since completing therapy.  The patient was encouraged to avoid sun exposure in the area of prior treatment for up to one year following radiation with either sunscreen or by the style of clothing worn in the sun.  The patient has scheduled follow up with her medical oncologist Dr. Lanny for ongoing surveillance, and was encouraged to call if she develops concerns or questions regarding radiation.   "

## 2024-10-31 ENCOUNTER — Ambulatory Visit

## 2024-11-07 ENCOUNTER — Inpatient Hospital Stay: Admitting: Hematology

## 2024-11-07 ENCOUNTER — Inpatient Hospital Stay: Attending: Hematology

## 2024-11-10 ENCOUNTER — Ambulatory Visit (HOSPITAL_COMMUNITY): Admitting: Psychiatry

## 2025-01-09 ENCOUNTER — Inpatient Hospital Stay: Admitting: Adult Health

## 2025-01-09 ENCOUNTER — Inpatient Hospital Stay

## 2025-02-01 ENCOUNTER — Ambulatory Visit

## 2025-02-16 ENCOUNTER — Encounter: Admitting: Family Medicine
